# Patient Record
Sex: Male | Born: 1945 | Race: White | Hispanic: No | Marital: Married | Smoking: Former smoker
Health system: Southern US, Community
[De-identification: ages and names within clinical notes are randomized; demographics above are authoritative.]

## PROBLEM LIST (undated history)

## (undated) DIAGNOSIS — J449 Chronic obstructive pulmonary disease, unspecified: Secondary | ICD-10-CM

## (undated) DIAGNOSIS — Z992 Dependence on renal dialysis: Secondary | ICD-10-CM

## (undated) DIAGNOSIS — I639 Cerebral infarction, unspecified: Secondary | ICD-10-CM

## (undated) DIAGNOSIS — E785 Hyperlipidemia, unspecified: Secondary | ICD-10-CM

## (undated) DIAGNOSIS — E119 Type 2 diabetes mellitus without complications: Secondary | ICD-10-CM

## (undated) DIAGNOSIS — K219 Gastro-esophageal reflux disease without esophagitis: Secondary | ICD-10-CM

## (undated) DIAGNOSIS — I2510 Atherosclerotic heart disease of native coronary artery without angina pectoris: Secondary | ICD-10-CM

## (undated) DIAGNOSIS — H547 Unspecified visual loss: Secondary | ICD-10-CM

## (undated) DIAGNOSIS — E1139 Type 2 diabetes mellitus with other diabetic ophthalmic complication: Secondary | ICD-10-CM

## (undated) DIAGNOSIS — J45909 Unspecified asthma, uncomplicated: Secondary | ICD-10-CM

## (undated) DIAGNOSIS — N186 End stage renal disease: Secondary | ICD-10-CM

## (undated) DIAGNOSIS — J189 Pneumonia, unspecified organism: Secondary | ICD-10-CM

## (undated) DIAGNOSIS — J479 Bronchiectasis, uncomplicated: Secondary | ICD-10-CM

## (undated) DIAGNOSIS — I517 Cardiomegaly: Secondary | ICD-10-CM

## (undated) DIAGNOSIS — I509 Heart failure, unspecified: Secondary | ICD-10-CM

## (undated) DIAGNOSIS — Z9981 Dependence on supplemental oxygen: Secondary | ICD-10-CM

## (undated) DIAGNOSIS — I252 Old myocardial infarction: Secondary | ICD-10-CM

## (undated) DIAGNOSIS — I10 Essential (primary) hypertension: Secondary | ICD-10-CM

## (undated) HISTORY — PX: APPENDECTOMY: SHX54

## (undated) HISTORY — PX: CATARACT EXTRACTION W/ INTRAOCULAR LENS IMPLANT: SHX1309

## (undated) SURGERY — Surgical Case
Anesthesia: *Unknown

---

## 1979-01-02 HISTORY — PX: REPAIR ANKLE LIGAMENT: SUR1187

## 1995-01-02 HISTORY — PX: TOTAL HIP ARTHROPLASTY: SHX124

## 2004-01-02 DIAGNOSIS — I252 Old myocardial infarction: Secondary | ICD-10-CM

## 2004-01-02 DIAGNOSIS — I639 Cerebral infarction, unspecified: Secondary | ICD-10-CM

## 2004-01-02 DIAGNOSIS — I2510 Atherosclerotic heart disease of native coronary artery without angina pectoris: Secondary | ICD-10-CM

## 2004-01-02 HISTORY — DX: Old myocardial infarction: I25.2

## 2004-01-02 HISTORY — DX: Cerebral infarction, unspecified: I63.9

## 2004-01-02 HISTORY — DX: Atherosclerotic heart disease of native coronary artery without angina pectoris: I25.10

## 2004-05-10 ENCOUNTER — Ambulatory Visit: Payer: Self-pay | Admitting: Ophthalmology

## 2005-07-05 ENCOUNTER — Ambulatory Visit: Payer: Self-pay | Admitting: Ophthalmology

## 2005-07-11 ENCOUNTER — Ambulatory Visit: Payer: Self-pay | Admitting: Ophthalmology

## 2006-08-29 ENCOUNTER — Inpatient Hospital Stay (HOSPITAL_COMMUNITY): Admission: EM | Admit: 2006-08-29 | Discharge: 2006-09-04 | Payer: Self-pay | Admitting: Internal Medicine

## 2006-08-29 ENCOUNTER — Ambulatory Visit: Payer: Self-pay | Admitting: Cardiovascular Disease

## 2006-08-30 ENCOUNTER — Encounter (INDEPENDENT_AMBULATORY_CARE_PROVIDER_SITE_OTHER): Payer: Self-pay | Admitting: Nephrology

## 2006-08-30 ENCOUNTER — Encounter (INDEPENDENT_AMBULATORY_CARE_PROVIDER_SITE_OTHER): Payer: Self-pay | Admitting: Orthopaedic Surgery

## 2006-08-31 ENCOUNTER — Encounter (INDEPENDENT_AMBULATORY_CARE_PROVIDER_SITE_OTHER): Payer: Self-pay | Admitting: Gastroenterology

## 2006-09-02 HISTORY — PX: AV FISTULA PLACEMENT: SHX1204

## 2006-09-02 HISTORY — PX: INSERTION OF DIALYSIS CATHETER: SHX1324

## 2006-09-03 ENCOUNTER — Ambulatory Visit: Payer: Self-pay | Admitting: Surgery

## 2006-09-09 ENCOUNTER — Ambulatory Visit: Payer: Self-pay | Admitting: Surgery

## 2007-06-16 ENCOUNTER — Ambulatory Visit: Payer: Self-pay | Admitting: Surgery

## 2007-06-18 ENCOUNTER — Ambulatory Visit (HOSPITAL_COMMUNITY): Admission: RE | Admit: 2007-06-18 | Discharge: 2007-06-18 | Payer: Self-pay | Admitting: Surgery

## 2007-06-18 ENCOUNTER — Ambulatory Visit: Payer: Self-pay | Admitting: Surgery

## 2007-07-07 ENCOUNTER — Ambulatory Visit: Payer: Self-pay | Admitting: Surgery

## 2007-08-11 ENCOUNTER — Ambulatory Visit: Payer: Self-pay | Admitting: Surgery

## 2007-11-17 ENCOUNTER — Ambulatory Visit: Payer: Self-pay | Admitting: Surgery

## 2008-02-07 ENCOUNTER — Ambulatory Visit (HOSPITAL_COMMUNITY): Admission: RE | Admit: 2008-02-07 | Discharge: 2008-02-07 | Payer: Self-pay | Admitting: Critical Care Medicine

## 2008-02-07 ENCOUNTER — Other Ambulatory Visit: Payer: Self-pay | Admitting: Emergency Medicine

## 2008-12-31 ENCOUNTER — Emergency Department (HOSPITAL_COMMUNITY): Admission: EM | Admit: 2008-12-31 | Discharge: 2008-12-31 | Payer: Self-pay | Admitting: Emergency Medicine

## 2009-08-18 ENCOUNTER — Inpatient Hospital Stay (HOSPITAL_COMMUNITY): Admission: EM | Admit: 2009-08-18 | Discharge: 2009-08-22 | Payer: Self-pay | Admitting: Emergency Medicine

## 2009-08-19 ENCOUNTER — Encounter: Payer: Self-pay | Admitting: Family Medicine

## 2009-08-20 ENCOUNTER — Encounter (INDEPENDENT_AMBULATORY_CARE_PROVIDER_SITE_OTHER): Payer: Self-pay | Admitting: Internal Medicine

## 2009-08-23 ENCOUNTER — Inpatient Hospital Stay (HOSPITAL_COMMUNITY): Admission: EM | Admit: 2009-08-23 | Discharge: 2009-09-02 | Payer: Self-pay | Admitting: Emergency Medicine

## 2009-10-19 ENCOUNTER — Ambulatory Visit (HOSPITAL_COMMUNITY): Admission: RE | Admit: 2009-10-19 | Discharge: 2009-10-19 | Payer: Self-pay | Admitting: Nephrology

## 2009-11-15 ENCOUNTER — Inpatient Hospital Stay (HOSPITAL_COMMUNITY): Admission: EM | Admit: 2009-11-15 | Discharge: 2009-11-18 | Payer: Self-pay | Admitting: Emergency Medicine

## 2010-03-14 LAB — CBC
HCT: 33.7 % — ABNORMAL LOW (ref 39.0–52.0)
HCT: 34.8 % — ABNORMAL LOW (ref 39.0–52.0)
Hemoglobin: 10.6 g/dL — ABNORMAL LOW (ref 13.0–17.0)
Hemoglobin: 10.8 g/dL — ABNORMAL LOW (ref 13.0–17.0)
Hemoglobin: 10.8 g/dL — ABNORMAL LOW (ref 13.0–17.0)
MCH: 29.6 pg (ref 26.0–34.0)
MCH: 29.9 pg (ref 26.0–34.0)
MCHC: 31.7 g/dL (ref 30.0–36.0)
MCHC: 31.7 g/dL (ref 30.0–36.0)
MCHC: 32 g/dL (ref 30.0–36.0)
MCV: 93.8 fL (ref 78.0–100.0)
MCV: 94.9 fL (ref 78.0–100.0)
Platelets: 293 10*3/uL (ref 150–400)
RBC: 3.53 MIL/uL — ABNORMAL LOW (ref 4.22–5.81)
RBC: 3.61 MIL/uL — ABNORMAL LOW (ref 4.22–5.81)
RBC: 3.71 MIL/uL — ABNORMAL LOW (ref 4.22–5.81)
WBC: 12.4 10*3/uL — ABNORMAL HIGH (ref 4.0–10.5)
WBC: 13.5 10*3/uL — ABNORMAL HIGH (ref 4.0–10.5)

## 2010-03-14 LAB — CULTURE, BLOOD (ROUTINE X 2)

## 2010-03-14 LAB — PROLACTIN: Prolactin: 24.3 ng/mL — ABNORMAL HIGH (ref 2.1–17.1)

## 2010-03-14 LAB — RENAL FUNCTION PANEL
Albumin: 2.9 g/dL — ABNORMAL LOW (ref 3.5–5.2)
Albumin: 3 g/dL — ABNORMAL LOW (ref 3.5–5.2)
BUN: 34 mg/dL — ABNORMAL HIGH (ref 6–23)
CO2: 23 mEq/L (ref 19–32)
CO2: 25 mEq/L (ref 19–32)
Calcium: 8.6 mg/dL (ref 8.4–10.5)
Chloride: 93 mEq/L — ABNORMAL LOW (ref 96–112)
Chloride: 94 mEq/L — ABNORMAL LOW (ref 96–112)
Creatinine, Ser: 10.69 mg/dL — ABNORMAL HIGH (ref 0.4–1.5)
Creatinine, Ser: 7.68 mg/dL — ABNORMAL HIGH (ref 0.4–1.5)
GFR calc Af Amer: 6 mL/min — ABNORMAL LOW (ref >60)
GFR calc Af Amer: 8 mL/min — ABNORMAL LOW (ref >60)
GFR calc non Af Amer: 5 mL/min — ABNORMAL LOW (ref >60)
GFR calc non Af Amer: 6 mL/min — ABNORMAL LOW (ref >60)
Glucose, Bld: 119 mg/dL — ABNORMAL HIGH (ref 70–99)
Glucose, Bld: 142 mg/dL — ABNORMAL HIGH (ref 70–99)
Phosphorus: 6.2 mg/dL — ABNORMAL HIGH (ref 2.3–4.6)
Potassium: 3.9 mEq/L (ref 3.5–5.1)
Potassium: 3.9 mEq/L (ref 3.5–5.1)
Sodium: 131 mEq/L — ABNORMAL LOW (ref 135–145)
Sodium: 132 mEq/L — ABNORMAL LOW (ref 135–145)

## 2010-03-14 LAB — URINALYSIS, ROUTINE W REFLEX MICROSCOPIC
Bilirubin Urine: NEGATIVE
Ketones, ur: NEGATIVE mg/dL
Nitrite: NEGATIVE
Protein, ur: >300 mg/dL — AB
pH: 6 (ref 5.0–8.0)

## 2010-03-14 LAB — GLUCOSE, CAPILLARY
Glucose-Capillary: 131 mg/dL — ABNORMAL HIGH (ref 70–99)
Glucose-Capillary: 147 mg/dL — ABNORMAL HIGH (ref 70–99)
Glucose-Capillary: 149 mg/dL — ABNORMAL HIGH (ref 70–99)
Glucose-Capillary: 157 mg/dL — ABNORMAL HIGH (ref 70–99)
Glucose-Capillary: 216 mg/dL — ABNORMAL HIGH (ref 70–99)
Glucose-Capillary: 218 mg/dL — ABNORMAL HIGH (ref 70–99)
Glucose-Capillary: 250 mg/dL — ABNORMAL HIGH (ref 70–99)

## 2010-03-14 LAB — BLOOD GAS, ARTERIAL
Drawn by: 12971
FIO2: 0.21 %
O2 Saturation: 99.3 %
Patient temperature: 98.6

## 2010-03-14 LAB — COMPREHENSIVE METABOLIC PANEL
ALT: 19 U/L (ref 0–53)
Alkaline Phosphatase: 49 U/L (ref 39–117)
CO2: 24 mEq/L (ref 19–32)
GFR calc non Af Amer: 4 mL/min — ABNORMAL LOW (ref >60)
Glucose, Bld: 224 mg/dL — ABNORMAL HIGH (ref 70–99)
Potassium: 6.1 mEq/L — ABNORMAL HIGH (ref 3.5–5.1)
Sodium: 138 mEq/L (ref 135–145)

## 2010-03-14 LAB — URINE MICROSCOPIC-ADD ON

## 2010-03-14 LAB — URINE CULTURE
Colony Count: 65000
Culture  Setup Time: 201111151022

## 2010-03-14 LAB — LIPASE, BLOOD: Lipase: 22 U/L (ref 11–59)

## 2010-03-14 LAB — DIFFERENTIAL
Basophils Absolute: 0.1 10*3/uL (ref 0.0–0.1)
Basophils Absolute: 0.1 10*3/uL (ref 0.0–0.1)
Basophils Relative: 1 % (ref 0–1)
Basophils Relative: 1 % (ref 0–1)
Eosinophils Absolute: 0.3 10*3/uL (ref 0.0–0.7)
Monocytes Relative: 11 % (ref 3–12)
Neutro Abs: 8.4 10*3/uL — ABNORMAL HIGH (ref 1.7–7.7)
Neutrophils Relative %: 64 % (ref 43–77)
Neutrophils Relative %: 68 % (ref 43–77)

## 2010-03-14 LAB — POCT CARDIAC MARKERS
CKMB, poc: 6 ng/mL (ref 1.0–8.0)
Troponin i, poc: <0.05 ng/mL (ref 0.00–0.09)

## 2010-03-15 LAB — CBC
Hemoglobin: 10.8 g/dL — ABNORMAL LOW (ref 13.0–17.0)
MCH: 30.2 pg (ref 26.0–34.0)
Platelets: 296 10*3/uL (ref 150–400)
RBC: 3.58 MIL/uL — ABNORMAL LOW (ref 4.22–5.81)
WBC: 14.2 10*3/uL — ABNORMAL HIGH (ref 4.0–10.5)

## 2010-03-15 LAB — PROTIME-INR
INR: 0.98 (ref 0.00–1.49)
Prothrombin Time: 13.2 seconds (ref 11.6–15.2)

## 2010-03-15 LAB — GLUCOSE, CAPILLARY: Glucose-Capillary: 98 mg/dL (ref 70–99)

## 2010-03-16 LAB — BASIC METABOLIC PANEL
BUN: 69 mg/dL — ABNORMAL HIGH (ref 6–23)
CO2: 28 mEq/L (ref 19–32)
Chloride: 94 mEq/L — ABNORMAL LOW (ref 96–112)
Potassium: 5 mEq/L (ref 3.5–5.1)

## 2010-03-16 LAB — GLUCOSE, CAPILLARY
Glucose-Capillary: 117 mg/dL — ABNORMAL HIGH (ref 70–99)
Glucose-Capillary: 140 mg/dL — ABNORMAL HIGH (ref 70–99)
Glucose-Capillary: 172 mg/dL — ABNORMAL HIGH (ref 70–99)
Glucose-Capillary: 214 mg/dL — ABNORMAL HIGH (ref 70–99)

## 2010-03-16 LAB — CBC
HCT: 29.8 % — ABNORMAL LOW (ref 39.0–52.0)
Hemoglobin: 10 g/dL — ABNORMAL LOW (ref 13.0–17.0)
MCH: 30.3 pg (ref 26.0–34.0)
MCHC: 33.6 g/dL (ref 30.0–36.0)
MCV: 90.3 fL (ref 78.0–100.0)
Platelets: 222 10*3/uL (ref 150–400)
RBC: 3.3 MIL/uL — ABNORMAL LOW (ref 4.22–5.81)
RDW: 12.3 % (ref 11.5–15.5)
WBC: 8.1 10*3/uL (ref 4.0–10.5)

## 2010-03-16 LAB — APTT: aPTT: 54 seconds — ABNORMAL HIGH (ref 24–37)

## 2010-03-16 LAB — PROTHROMBIN GENE MUTATION

## 2010-03-17 LAB — COMPREHENSIVE METABOLIC PANEL
ALT: 19 U/L (ref 0–53)
AST: 21 U/L (ref 0–37)
Albumin: 2.9 g/dL — ABNORMAL LOW (ref 3.5–5.2)
Alkaline Phosphatase: 51 U/L (ref 39–117)
BUN: 28 mg/dL — ABNORMAL HIGH (ref 6–23)
CO2: 29 mEq/L (ref 19–32)
CO2: 31 mEq/L (ref 19–32)
Calcium: 9 mg/dL (ref 8.4–10.5)
Calcium: 9.4 mg/dL (ref 8.4–10.5)
Chloride: 95 mEq/L — ABNORMAL LOW (ref 96–112)
Creatinine, Ser: 5.06 mg/dL — ABNORMAL HIGH (ref 0.4–1.5)
Creatinine, Ser: 6.49 mg/dL — ABNORMAL HIGH (ref 0.4–1.5)
GFR calc Af Amer: 11 mL/min — ABNORMAL LOW (ref >60)
GFR calc Af Amer: 14 mL/min — ABNORMAL LOW (ref >60)
GFR calc non Af Amer: 12 mL/min — ABNORMAL LOW (ref >60)
GFR calc non Af Amer: 9 mL/min — ABNORMAL LOW (ref >60)
Glucose, Bld: 342 mg/dL — ABNORMAL HIGH (ref 70–99)
Glucose, Bld: 75 mg/dL (ref 70–99)
Potassium: 4.1 mEq/L (ref 3.5–5.1)
Sodium: 134 mEq/L — ABNORMAL LOW (ref 135–145)
Sodium: 134 mEq/L — ABNORMAL LOW (ref 135–145)
Total Bilirubin: 0.4 mg/dL (ref 0.3–1.2)
Total Protein: 5.7 g/dL — ABNORMAL LOW (ref 6.0–8.3)
Total Protein: 6.2 g/dL (ref 6.0–8.3)

## 2010-03-17 LAB — URINE MICROSCOPIC-ADD ON

## 2010-03-17 LAB — DIFFERENTIAL
Basophils Absolute: 0 10*3/uL (ref 0.0–0.1)
Basophils Absolute: 0.1 10*3/uL (ref 0.0–0.1)
Basophils Absolute: 0.1 10*3/uL (ref 0.0–0.1)
Basophils Absolute: 0.1 10*3/uL (ref 0.0–0.1)
Basophils Relative: 0 % (ref 0–1)
Basophils Relative: 1 % (ref 0–1)
Basophils Relative: 1 % (ref 0–1)
Basophils Relative: 1 % (ref 0–1)
Eosinophils Absolute: 0.1 10*3/uL (ref 0.0–0.7)
Eosinophils Absolute: 0.2 10*3/uL (ref 0.0–0.7)
Eosinophils Absolute: 0.4 10*3/uL (ref 0.0–0.7)
Eosinophils Absolute: 0.5 10*3/uL (ref 0.0–0.7)
Eosinophils Relative: 1 % (ref 0–5)
Eosinophils Relative: 2 % (ref 0–5)
Eosinophils Relative: 4 % (ref 0–5)
Eosinophils Relative: 6 % — ABNORMAL HIGH (ref 0–5)
Lymphocytes Relative: 13 % (ref 12–46)
Lymphocytes Relative: 27 % (ref 12–46)
Lymphs Abs: 1.3 10*3/uL (ref 0.7–4.0)
Lymphs Abs: 2.3 10*3/uL (ref 0.7–4.0)
Lymphs Abs: 3 10*3/uL (ref 0.7–4.0)
Monocytes Absolute: 0.8 10*3/uL (ref 0.1–1.0)
Monocytes Absolute: 0.8 10*3/uL (ref 0.1–1.0)
Monocytes Absolute: 0.9 10*3/uL (ref 0.1–1.0)
Monocytes Absolute: 1.1 10*3/uL — ABNORMAL HIGH (ref 0.1–1.0)
Monocytes Relative: 10 % (ref 3–12)
Monocytes Relative: 8 % (ref 3–12)
Monocytes Relative: 8 % (ref 3–12)
Monocytes Relative: 8 % (ref 3–12)
Monocytes Relative: 9 % (ref 3–12)
Neutro Abs: 4.7 10*3/uL (ref 1.7–7.7)
Neutro Abs: 6.3 10*3/uL (ref 1.7–7.7)
Neutro Abs: 7.7 10*3/uL (ref 1.7–7.7)
Neutro Abs: 7.7 10*3/uL (ref 1.7–7.7)
Neutrophils Relative %: 56 % (ref 43–77)
Neutrophils Relative %: 58 % (ref 43–77)
Neutrophils Relative %: 77 % (ref 43–77)

## 2010-03-17 LAB — CULTURE, BLOOD (ROUTINE X 2): Culture: NO GROWTH

## 2010-03-17 LAB — GLUCOSE, CAPILLARY
Glucose-Capillary: 100 mg/dL — ABNORMAL HIGH (ref 70–99)
Glucose-Capillary: 114 mg/dL — ABNORMAL HIGH (ref 70–99)
Glucose-Capillary: 114 mg/dL — ABNORMAL HIGH (ref 70–99)
Glucose-Capillary: 118 mg/dL — ABNORMAL HIGH (ref 70–99)
Glucose-Capillary: 122 mg/dL — ABNORMAL HIGH (ref 70–99)
Glucose-Capillary: 123 mg/dL — ABNORMAL HIGH (ref 70–99)
Glucose-Capillary: 130 mg/dL — ABNORMAL HIGH (ref 70–99)
Glucose-Capillary: 135 mg/dL — ABNORMAL HIGH (ref 70–99)
Glucose-Capillary: 139 mg/dL — ABNORMAL HIGH (ref 70–99)
Glucose-Capillary: 141 mg/dL — ABNORMAL HIGH (ref 70–99)
Glucose-Capillary: 157 mg/dL — ABNORMAL HIGH (ref 70–99)
Glucose-Capillary: 173 mg/dL — ABNORMAL HIGH (ref 70–99)
Glucose-Capillary: 177 mg/dL — ABNORMAL HIGH (ref 70–99)
Glucose-Capillary: 183 mg/dL — ABNORMAL HIGH (ref 70–99)
Glucose-Capillary: 184 mg/dL — ABNORMAL HIGH (ref 70–99)
Glucose-Capillary: 185 mg/dL — ABNORMAL HIGH (ref 70–99)
Glucose-Capillary: 201 mg/dL — ABNORMAL HIGH (ref 70–99)
Glucose-Capillary: 206 mg/dL — ABNORMAL HIGH (ref 70–99)
Glucose-Capillary: 212 mg/dL — ABNORMAL HIGH (ref 70–99)
Glucose-Capillary: 221 mg/dL — ABNORMAL HIGH (ref 70–99)
Glucose-Capillary: 222 mg/dL — ABNORMAL HIGH (ref 70–99)
Glucose-Capillary: 227 mg/dL — ABNORMAL HIGH (ref 70–99)
Glucose-Capillary: 228 mg/dL — ABNORMAL HIGH (ref 70–99)
Glucose-Capillary: 229 mg/dL — ABNORMAL HIGH (ref 70–99)
Glucose-Capillary: 233 mg/dL — ABNORMAL HIGH (ref 70–99)
Glucose-Capillary: 245 mg/dL — ABNORMAL HIGH (ref 70–99)
Glucose-Capillary: 245 mg/dL — ABNORMAL HIGH (ref 70–99)
Glucose-Capillary: 261 mg/dL — ABNORMAL HIGH (ref 70–99)
Glucose-Capillary: 374 mg/dL — ABNORMAL HIGH (ref 70–99)
Glucose-Capillary: 51 mg/dL — ABNORMAL LOW (ref 70–99)
Glucose-Capillary: 54 mg/dL — ABNORMAL LOW (ref 70–99)
Glucose-Capillary: 56 mg/dL — ABNORMAL LOW (ref 70–99)
Glucose-Capillary: 56 mg/dL — ABNORMAL LOW (ref 70–99)
Glucose-Capillary: 61 mg/dL — ABNORMAL LOW (ref 70–99)
Glucose-Capillary: 74 mg/dL (ref 70–99)
Glucose-Capillary: 76 mg/dL (ref 70–99)
Glucose-Capillary: 76 mg/dL (ref 70–99)
Glucose-Capillary: 77 mg/dL (ref 70–99)
Glucose-Capillary: 80 mg/dL (ref 70–99)

## 2010-03-17 LAB — BASIC METABOLIC PANEL
BUN: 19 mg/dL (ref 6–23)
BUN: 40 mg/dL — ABNORMAL HIGH (ref 6–23)
BUN: 41 mg/dL — ABNORMAL HIGH (ref 6–23)
CO2: 27 mEq/L (ref 19–32)
CO2: 28 mEq/L (ref 19–32)
CO2: 29 mEq/L (ref 19–32)
CO2: 29 mEq/L (ref 19–32)
Calcium: 10.3 mg/dL (ref 8.4–10.5)
Calcium: 9.4 mg/dL (ref 8.4–10.5)
Calcium: 9.6 mg/dL (ref 8.4–10.5)
Chloride: 101 mEq/L (ref 96–112)
Chloride: 91 mEq/L — ABNORMAL LOW (ref 96–112)
Chloride: 95 mEq/L — ABNORMAL LOW (ref 96–112)
Chloride: 99 mEq/L (ref 96–112)
Creatinine, Ser: 5.18 mg/dL — ABNORMAL HIGH (ref 0.4–1.5)
Creatinine, Ser: 5.25 mg/dL — ABNORMAL HIGH (ref 0.4–1.5)
Creatinine, Ser: 7.33 mg/dL — ABNORMAL HIGH (ref 0.4–1.5)
Creatinine, Ser: 8.74 mg/dL — ABNORMAL HIGH (ref 0.4–1.5)
GFR calc Af Amer: 13 mL/min — ABNORMAL LOW (ref >60)
GFR calc Af Amer: 14 mL/min — ABNORMAL LOW (ref >60)
GFR calc Af Amer: 7 mL/min — ABNORMAL LOW (ref >60)
GFR calc Af Amer: 9 mL/min — ABNORMAL LOW (ref >60)
GFR calc non Af Amer: 10 mL/min — ABNORMAL LOW (ref >60)
GFR calc non Af Amer: 11 mL/min — ABNORMAL LOW (ref >60)
GFR calc non Af Amer: 6 mL/min — ABNORMAL LOW (ref >60)
GFR calc non Af Amer: 8 mL/min — ABNORMAL LOW (ref >60)
Glucose, Bld: 166 mg/dL — ABNORMAL HIGH (ref 70–99)
Glucose, Bld: 211 mg/dL — ABNORMAL HIGH (ref 70–99)
Glucose, Bld: 253 mg/dL — ABNORMAL HIGH (ref 70–99)
Potassium: 3.5 mEq/L (ref 3.5–5.1)
Potassium: 3.7 mEq/L (ref 3.5–5.1)
Potassium: 4.2 mEq/L (ref 3.5–5.1)
Potassium: 4.6 mEq/L (ref 3.5–5.1)
Potassium: 5 mEq/L (ref 3.5–5.1)
Sodium: 129 mEq/L — ABNORMAL LOW (ref 135–145)
Sodium: 135 mEq/L (ref 135–145)
Sodium: 135 mEq/L (ref 135–145)
Sodium: 137 mEq/L (ref 135–145)
Sodium: 142 mEq/L (ref 135–145)

## 2010-03-17 LAB — TROPONIN I
Troponin I: 0.05 ng/mL (ref 0.00–0.06)
Troponin I: 0.06 ng/mL (ref 0.00–0.06)
Troponin I: 0.08 ng/mL — ABNORMAL HIGH (ref 0.00–0.06)

## 2010-03-17 LAB — POCT CARDIAC MARKERS
CKMB, poc: 2.1 ng/mL (ref 1.0–8.0)
CKMB, poc: 3.5 ng/mL (ref 1.0–8.0)
Myoglobin, poc: >500 ng/mL (ref 12–200)
Troponin i, poc: <0.05 ng/mL (ref 0.00–0.09)
Troponin i, poc: <0.05 ng/mL (ref 0.00–0.09)

## 2010-03-17 LAB — RENAL FUNCTION PANEL
Albumin: 2.9 g/dL — ABNORMAL LOW (ref 3.5–5.2)
Albumin: 3 g/dL — ABNORMAL LOW (ref 3.5–5.2)
Albumin: 3 g/dL — ABNORMAL LOW (ref 3.5–5.2)
Albumin: 3.1 g/dL — ABNORMAL LOW (ref 3.5–5.2)
BUN: 19 mg/dL (ref 6–23)
BUN: 22 mg/dL (ref 6–23)
BUN: 45 mg/dL — ABNORMAL HIGH (ref 6–23)
CO2: 27 mEq/L (ref 19–32)
CO2: 30 mEq/L (ref 19–32)
CO2: 31 mEq/L (ref 19–32)
CO2: 31 mEq/L (ref 19–32)
CO2: 35 mEq/L — ABNORMAL HIGH (ref 19–32)
Calcium: 10.3 mg/dL (ref 8.4–10.5)
Calcium: 10.6 mg/dL — ABNORMAL HIGH (ref 8.4–10.5)
Calcium: 9.9 mg/dL (ref 8.4–10.5)
Chloride: 100 mEq/L (ref 96–112)
Chloride: 89 mEq/L — ABNORMAL LOW (ref 96–112)
Chloride: 91 mEq/L — ABNORMAL LOW (ref 96–112)
Chloride: 98 mEq/L (ref 96–112)
Creatinine, Ser: 10.37 mg/dL — ABNORMAL HIGH (ref 0.4–1.5)
Creatinine, Ser: 6.07 mg/dL — ABNORMAL HIGH (ref 0.4–1.5)
Creatinine, Ser: 6.96 mg/dL — ABNORMAL HIGH (ref 0.4–1.5)
Creatinine, Ser: 8.35 mg/dL — ABNORMAL HIGH (ref 0.4–1.5)
GFR calc Af Amer: 11 mL/min — ABNORMAL LOW (ref >60)
GFR calc Af Amer: 6 mL/min — ABNORMAL LOW (ref >60)
GFR calc Af Amer: 7 mL/min — ABNORMAL LOW (ref >60)
GFR calc Af Amer: 8 mL/min — ABNORMAL LOW (ref >60)
GFR calc non Af Amer: 5 mL/min — ABNORMAL LOW (ref >60)
GFR calc non Af Amer: 6 mL/min — ABNORMAL LOW (ref >60)
GFR calc non Af Amer: 7 mL/min — ABNORMAL LOW (ref >60)
GFR calc non Af Amer: 9 mL/min — ABNORMAL LOW (ref >60)
Glucose, Bld: 108 mg/dL — ABNORMAL HIGH (ref 70–99)
Glucose, Bld: 149 mg/dL — ABNORMAL HIGH (ref 70–99)
Glucose, Bld: 235 mg/dL — ABNORMAL HIGH (ref 70–99)
Glucose, Bld: 79 mg/dL (ref 70–99)
Phosphorus: 4.1 mg/dL (ref 2.3–4.6)
Phosphorus: 5.2 mg/dL — ABNORMAL HIGH (ref 2.3–4.6)
Phosphorus: 5.8 mg/dL — ABNORMAL HIGH (ref 2.3–4.6)
Potassium: 3.4 mEq/L — ABNORMAL LOW (ref 3.5–5.1)
Potassium: 3.9 mEq/L (ref 3.5–5.1)
Sodium: 128 mEq/L — ABNORMAL LOW (ref 135–145)
Sodium: 130 mEq/L — ABNORMAL LOW (ref 135–145)
Sodium: 135 mEq/L (ref 135–145)
Sodium: 136 mEq/L (ref 135–145)
Sodium: 137 mEq/L (ref 135–145)
Sodium: 141 mEq/L (ref 135–145)

## 2010-03-17 LAB — LIPID PANEL
Cholesterol: 80 mg/dL (ref 0–200)
LDL Cholesterol: 23 mg/dL (ref 0–99)
LDL Cholesterol: 37 mg/dL (ref 0–99)
Triglycerides: 152 mg/dL — ABNORMAL HIGH (ref <150)
VLDL: 27 mg/dL (ref 0–40)

## 2010-03-17 LAB — CBC
HCT: 31.6 % — ABNORMAL LOW (ref 39.0–52.0)
HCT: 32.3 % — ABNORMAL LOW (ref 39.0–52.0)
HCT: 32.6 % — ABNORMAL LOW (ref 39.0–52.0)
HCT: 33.4 % — ABNORMAL LOW (ref 39.0–52.0)
HCT: 33.7 % — ABNORMAL LOW (ref 39.0–52.0)
HCT: 36.1 % — ABNORMAL LOW (ref 39.0–52.0)
Hemoglobin: 10.2 g/dL — ABNORMAL LOW (ref 13.0–17.0)
Hemoglobin: 10.3 g/dL — ABNORMAL LOW (ref 13.0–17.0)
Hemoglobin: 10.3 g/dL — ABNORMAL LOW (ref 13.0–17.0)
Hemoglobin: 10.4 g/dL — ABNORMAL LOW (ref 13.0–17.0)
Hemoglobin: 10.8 g/dL — ABNORMAL LOW (ref 13.0–17.0)
Hemoglobin: 11.1 g/dL — ABNORMAL LOW (ref 13.0–17.0)
Hemoglobin: 11.1 g/dL — ABNORMAL LOW (ref 13.0–17.0)
Hemoglobin: 11.6 g/dL — ABNORMAL LOW (ref 13.0–17.0)
Hemoglobin: 9.8 g/dL — ABNORMAL LOW (ref 13.0–17.0)
MCH: 29.8 pg (ref 26.0–34.0)
MCH: 29.8 pg (ref 26.0–34.0)
MCH: 29.9 pg (ref 26.0–34.0)
MCH: 29.9 pg (ref 26.0–34.0)
MCH: 30.2 pg (ref 26.0–34.0)
MCH: 30.4 pg (ref 26.0–34.0)
MCH: 30.5 pg (ref 26.0–34.0)
MCHC: 32.1 g/dL (ref 30.0–36.0)
MCHC: 32.3 g/dL (ref 30.0–36.0)
MCHC: 32.4 g/dL (ref 30.0–36.0)
MCHC: 32.9 g/dL (ref 30.0–36.0)
MCHC: 33 g/dL (ref 30.0–36.0)
MCV: 89.3 fL (ref 78.0–100.0)
MCV: 90.3 fL (ref 78.0–100.0)
MCV: 92 fL (ref 78.0–100.0)
MCV: 93.9 fL (ref 78.0–100.0)
MCV: 95 fL (ref 78.0–100.0)
Platelets: 198 10*3/uL (ref 150–400)
Platelets: 202 10*3/uL (ref 150–400)
Platelets: 214 10*3/uL (ref 150–400)
Platelets: 217 10*3/uL (ref 150–400)
Platelets: 242 10*3/uL (ref 150–400)
Platelets: 247 10*3/uL (ref 150–400)
Platelets: 274 10*3/uL (ref 150–400)
RBC: 3.22 MIL/uL — ABNORMAL LOW (ref 4.22–5.81)
RBC: 3.44 MIL/uL — ABNORMAL LOW (ref 4.22–5.81)
RBC: 3.44 MIL/uL — ABNORMAL LOW (ref 4.22–5.81)
RBC: 3.44 MIL/uL — ABNORMAL LOW (ref 4.22–5.81)
RBC: 3.61 MIL/uL — ABNORMAL LOW (ref 4.22–5.81)
RBC: 3.65 MIL/uL — ABNORMAL LOW (ref 4.22–5.81)
RBC: 3.8 MIL/uL — ABNORMAL LOW (ref 4.22–5.81)
RDW: 12.6 % (ref 11.5–15.5)
RDW: 12.7 % (ref 11.5–15.5)
RDW: 12.8 % (ref 11.5–15.5)
RDW: 12.8 % (ref 11.5–15.5)
RDW: 12.9 % (ref 11.5–15.5)
RDW: 13.2 % (ref 11.5–15.5)
WBC: 10 10*3/uL (ref 4.0–10.5)
WBC: 10.5 10*3/uL (ref 4.0–10.5)
WBC: 11 10*3/uL — ABNORMAL HIGH (ref 4.0–10.5)
WBC: 8.8 10*3/uL (ref 4.0–10.5)
WBC: 9.5 10*3/uL (ref 4.0–10.5)

## 2010-03-17 LAB — POCT I-STAT, CHEM 8
BUN: 24 mg/dL — ABNORMAL HIGH (ref 6–23)
Calcium, Ion: 1.11 mmol/L — ABNORMAL LOW (ref 1.12–1.32)
Calcium, Ion: 1.27 mmol/L (ref 1.12–1.32)
Chloride: 100 mEq/L (ref 96–112)
Chloride: 98 mEq/L (ref 96–112)
Creatinine, Ser: 5 mg/dL — ABNORMAL HIGH (ref 0.4–1.5)
Creatinine, Ser: 7.3 mg/dL — ABNORMAL HIGH (ref 0.4–1.5)
Glucose, Bld: 266 mg/dL — ABNORMAL HIGH (ref 70–99)
Glucose, Bld: 344 mg/dL — ABNORMAL HIGH (ref 70–99)
HCT: 36 % — ABNORMAL LOW (ref 39.0–52.0)
HCT: 37 % — ABNORMAL LOW (ref 39.0–52.0)
Hemoglobin: 10.9 g/dL — ABNORMAL LOW (ref 13.0–17.0)
Hemoglobin: 12.2 g/dL — ABNORMAL LOW (ref 13.0–17.0)
Hemoglobin: 12.6 g/dL — ABNORMAL LOW (ref 13.0–17.0)
Potassium: 3.5 mEq/L (ref 3.5–5.1)
Potassium: 4.1 mEq/L (ref 3.5–5.1)
Sodium: 132 mEq/L — ABNORMAL LOW (ref 135–145)
Sodium: 134 mEq/L — ABNORMAL LOW (ref 135–145)
Sodium: 139 mEq/L (ref 135–145)
TCO2: 28 mmol/L (ref 0–100)
TCO2: 28 mmol/L (ref 0–100)

## 2010-03-17 LAB — HEMOGLOBIN A1C
Hgb A1c MFr Bld: 7.3 % — ABNORMAL HIGH (ref <5.7)
Mean Plasma Glucose: 163 mg/dL — ABNORMAL HIGH (ref <117)

## 2010-03-17 LAB — CK TOTAL AND CKMB (NOT AT ARMC)
CK, MB: 6.2 ng/mL (ref 0.3–4.0)
Relative Index: 3 — ABNORMAL HIGH (ref 0.0–2.5)
Relative Index: 5 — ABNORMAL HIGH (ref 0.0–2.5)
Total CK: 112 U/L (ref 7–232)
Total CK: 121 U/L (ref 7–232)
Total CK: 124 U/L (ref 7–232)

## 2010-03-17 LAB — CARDIAC PANEL(CRET KIN+CKTOT+MB+TROPI)
CK, MB: 3.1 ng/mL (ref 0.3–4.0)
CK, MB: 3.1 ng/mL (ref 0.3–4.0)
Relative Index: 2.8 — ABNORMAL HIGH (ref 0.0–2.5)
Relative Index: 2.9 — ABNORMAL HIGH (ref 0.0–2.5)
Total CK: 111 U/L (ref 7–232)
Troponin I: 0.09 ng/mL — ABNORMAL HIGH (ref 0.00–0.06)

## 2010-03-17 LAB — HEPATIC FUNCTION PANEL
AST: 29 U/L (ref 0–37)
Bilirubin, Direct: 0.2 mg/dL (ref 0.0–0.3)
Indirect Bilirubin: 0.3 mg/dL (ref 0.3–0.9)
Total Bilirubin: 0.5 mg/dL (ref 0.3–1.2)

## 2010-03-17 LAB — URINALYSIS, ROUTINE W REFLEX MICROSCOPIC
Bilirubin Urine: NEGATIVE
Bilirubin Urine: NEGATIVE
Ketones, ur: NEGATIVE mg/dL
Ketones, ur: NEGATIVE mg/dL
Leukocytes, UA: NEGATIVE
Nitrite: NEGATIVE
Nitrite: NEGATIVE
Specific Gravity, Urine: 1.025 (ref 1.005–1.030)
Urobilinogen, UA: 0.2 mg/dL (ref 0.0–1.0)
Urobilinogen, UA: 0.2 mg/dL (ref 0.0–1.0)
pH: 6.5 (ref 5.0–8.0)

## 2010-03-17 LAB — PHOSPHORUS: Phosphorus: 4.2 mg/dL (ref 2.3–4.6)

## 2010-03-17 LAB — PROTIME-INR
INR: 0.99 (ref 0.00–1.49)
Prothrombin Time: 13.3 seconds (ref 11.6–15.2)

## 2010-03-17 LAB — TSH
TSH: 0.873 u[IU]/mL (ref 0.350–4.500)
TSH: 1.239 u[IU]/mL (ref 0.350–4.500)

## 2010-03-17 LAB — APTT: aPTT: 31 seconds (ref 24–37)

## 2010-03-17 LAB — MRSA PCR SCREENING: MRSA by PCR: NEGATIVE

## 2010-03-17 LAB — CORTISOL: Cortisol, Plasma: 9.9 ug/dL

## 2010-03-17 LAB — LIPASE, BLOOD: Lipase: 24 U/L (ref 11–59)

## 2010-03-17 LAB — BRAIN NATRIURETIC PEPTIDE: Pro B Natriuretic peptide (BNP): 867 pg/mL — ABNORMAL HIGH (ref 0.0–100.0)

## 2010-03-17 LAB — HEMOCCULT GUIAC POC 1CARD (OFFICE): Fecal Occult Bld: NEGATIVE

## 2010-03-17 LAB — TESTOSTERONE: Testosterone: 135.28 ng/dL — ABNORMAL LOW (ref 350–890)

## 2010-03-17 LAB — LACTIC ACID, PLASMA: Lactic Acid, Venous: 0.9 mmol/L (ref 0.5–2.2)

## 2010-04-03 LAB — COMPREHENSIVE METABOLIC PANEL
AST: 20 U/L (ref 0–37)
Albumin: 3.1 g/dL — ABNORMAL LOW (ref 3.5–5.2)
Alkaline Phosphatase: 44 U/L (ref 39–117)
BUN: 43 mg/dL — ABNORMAL HIGH (ref 6–23)
Chloride: 99 mEq/L (ref 96–112)
GFR calc Af Amer: 10 mL/min — ABNORMAL LOW (ref >60)
Potassium: 4 mEq/L (ref 3.5–5.1)
Total Bilirubin: 0.3 mg/dL (ref 0.3–1.2)

## 2010-04-03 LAB — BRAIN NATRIURETIC PEPTIDE: Pro B Natriuretic peptide (BNP): 264 pg/mL — ABNORMAL HIGH (ref 0.0–100.0)

## 2010-04-03 LAB — CBC
HCT: 35 % — ABNORMAL LOW (ref 39.0–52.0)
Platelets: 206 10*3/uL (ref 150–400)
WBC: 7.3 10*3/uL (ref 4.0–10.5)

## 2010-04-03 LAB — DIFFERENTIAL
Basophils Absolute: 0 10*3/uL (ref 0.0–0.1)
Basophils Relative: 1 % (ref 0–1)
Eosinophils Absolute: 0.4 10*3/uL (ref 0.0–0.7)
Eosinophils Relative: 5 % (ref 0–5)
Monocytes Absolute: 0.7 10*3/uL (ref 0.1–1.0)

## 2010-05-16 NOTE — Assessment & Plan Note (Signed)
OFFICE VISIT   THEODORE, VIRGIN  DOB:  1945/09/12                                       06/16/2007  ZOXWR#:[id number]   REASON FOR VISIT:  Evaluate for steal.   HISTORY:  This is a 65 year old gentleman who had a right forearm graft  placed on 09/03/2006, following that he had mild complaints of steal.  Ultrasound was checked at that time that revealed a left arm pressure of  90 and the right arm pressure without compression of 80 mmHg and with  compression was 94.  I thought this was a mild steal syndrome at that  time and prescribed him exercise.  He was supposed to get in touch me if  it had gotten worse.  He comes back in today because he is having  weakness in his right arm as well as pain and a feeling of coldness.  His graft was used once, and he had significant pain from that.  This  sounds like maybe an extravasation.  He is adamantly refusing using the  graft again.  He states that he will never have another graft and that  this one will never be used again.   EXAM:  His blood pressure is 181/94, pulse 97, respirations 18.  He is  in no acute distress.  There is no evidence of ischemia or necrosis in  the right hand.  With graft compression, the radial artery has a  palpable pulse.  Without compression it is not palpable.   Steal syndrome, right arm.   PLAN:  I had a long conversation with the patient today regarding our  next step.  He is adamant that this graft will never be used again and  that he will only receive dialysis through catheters.  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  At the very least, it  should help with the temperature discrepancy.  I am scheduling him to  get his graft ligated Wednesday June 17th.   Jorge Ny, MD  Electronically Signed   VWB/MEDQ   D:  06/16/2007  T:  06/17/2007  Job:  [id number]   cc:   Garnetta Buddy, M.D.

## 2010-05-16 NOTE — Assessment & Plan Note (Signed)
OFFICE VISIT   OLIVER, HEITZENRATER  DOB:  05-Sep-1945                                       07/07/2007  ZOXWR#:[id number]   REASON FOR VISIT:  Followup.   HISTORY:  This is a 65 year old gentleman who had a forearm graft placed  on 09/03/2006.  At that time he had mild complaints of steal.  He did  not have significant brachial pressure discrepancy.  He is supposed to  get in touch me if they got worse.  However, I did not see him back  until June 15th.  At that time, he had poor motor function in his right  hand.  The right hand was also colder.  He refuses to have dialysis in  that graft and wants it ligated.  On June 27th, he underwent graft  ligation without complications.  He comes back in today for followup.   EXAM:  Incision is well-healed.  He has a palpable radial pulse.  There  is no flow within the graft.   ASSESSMENT/PLAN:  Status post graft ligation for steal.   PLAN:  The patient still has functional deficits with his hand.  He is  still unable to hold a glass and has poor fine motor skills.  I am  recommending that he be seen by a physical therapist to work on  retraining his hand.  He understands that the function may not come  back.  I am going to see him back in a month to see how he is doing.   Jorge Ny, MD  Electronically Signed   VWB/MEDQ  D:  07/07/2007  T:  07/08/2007  Job:  [id number]
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
## 2010-05-16 NOTE — Assessment & Plan Note (Signed)
OFFICE VISIT   JILL, RUPPE  DOB:  12/10/1945                                       08/11/2007  [id number]#:[id number]   REASON FOR VISIT:  Evaluate right arm steal.   HISTORY:  This is a 65 year old gentleman who had a right forearm graft  placed on 09/03/2006.  He had mild complaints of steal at that time, and  it was not until June that I saw him when he had poor motor function and  temperature changes in his right hand.  He had a mild steal by duplex  ultrasound, but I felt that, given his symptoms, we should proceed with  graft ligation, since he was refusing use of the graft.  This graft was  ligated on June 27th, and I saw him back in the office on July 6.  At  that time, he was still having difficulty with his hand, and therefore,  I referred him to physical therapy to help improve his function.  He has  not been very rigorous with his physical therapy.  He is no better today  than he was prior to his graft ligation.   EXAMINATION:  Blood pressure is 132/75, pulse 81.  The right hand is  warmer than it has been in the past.  He is still very weak in his  hands.  He has some thenar atrophy.   PLAN:  We talked of possibly sending for nerve conduction studies.  However, I feel that he needs to do a better job with physical therapy.  I have encouraged him to sort of push the limits of his therapy to try  to improve the function in his hand.  I am going to see him back in 3  months to see how he is doing.   Jorge Ny, MD  Electronically Signed   VWB/MEDQ  D:  08/11/2007  T:  08/12/2007  Job:  [id number]

## 2010-05-16 NOTE — Consult Note (Signed)
NAMEANGEL, WEEDON              ACCOUNT NO.:  [id number]   MEDICAL RECORD NO.:  [id number]          PATIENT TYPE:  INP   LOCATION:  5528                         FACILITY:  [facility]   PHYSICIAN:  Jordan Hawks. Elnoria Howard, MD    DATE OF BIRTH:  Jul 26, 1945   DATE OF CONSULTATION:  08/29/2006  DATE OF DISCHARGE:                                 CONSULTATION   REASON FOR CONSULTATION:  Hematemesis.   REFERRING PHYSICIAN:  Valetta Close, M.D.   HISTORY OF PRESENT ILLNESS:  This is a 66 year old gentleman with a past  medical history of hypertension, CHF, status post MI, chronic renal  insufficiency and a history of CVA and hyperlipidemia and diabetes and  who was admitted to the hospital with general malaise.  The patient was  noted to have a marked increase in his creatinine up to 10.4.  Apparently he had a prior value of 1.7 on February 2006.  The patient is  a very poor historian and is unable to provide any useful information.  Apparently from what was requested, the patient did have an episode of  hematemesis.  It is  uncertain if the patient has any reports of  hematochezia or melena as the patient is legally blind.  Apparently  there is a conflicting history from the family members.  The patient  does have some type of vague abdominal pain but he is unable to provide  any further description in regard to the length of time, the quality,  location, et cetera.  His hemoglobin is noted to be stable at this time.   PAST MEDICAL HISTORY AND PAST SURGICAL HISTORY:  As stated above.   FAMILY HISTORY:  Noncontributory.   SOCIAL HISTORY:  The patient quit tobacco 25 years ago, as well as  alcohol.  He is married and has children.   REVIEW OF SYSTEMS:  Unable to obtain.   MEDICATIONS:  Metoprolol, Lipitor, Lasix, Lantus, glipizide, clonidine,  lisinopril, Plavix and protein shake.   PHYSICAL EXAMINATION:  VITAL SIGNS:  Blood pressure is 140/80s.  Pulse  oximetry is 98% on room air.   Temperature is 99.1.  Heart rate is 55.  GENERAL:  The patient is lying supine in the hemodialysis unit.  He is  in no acute distress.  NECK:  Supple, no lymphadenopathy.  LUNGS:  Are clear to auscultation bilaterally.  CARDIOVASCULAR:  Regular rate and rhythm.  ABDOMEN:  Obese, soft.  Mild tenderness.  Unable to adequately localize.  No rebound or rigidity.  Positive bowel sounds.  EXTREMITIES:  No clubbing, cyanosis, or edema.  RECTAL EXAMINATION:  Heme negative.  No palpable masses and brown stool.   LABORATORY VALUES:  White blood cell count is 13.1, hemoglobin 12.6,  platelets 464.  Sodium 142, potassium 4.8, chloride 100, CO2 21, BUN  197, creatinine 10.5, glucose at 237.  AST 10, ALT 18, alk phos was 51.  Total bilirubin is 0.7, albumin 3.1.  PT is 14.5, INR 1.1, PTT is 33.   IMPRESSION:  1. Reports of hematemesis.  2. Renal failure.  3. Diabetes.  4. Congestive heart failure.  Unfortunately, the patient is not able to provide any useful history.  Again, management will be undertaken per the report of hematemesis  provided by the renal physicians.  There is no significant abdominal  tenderness to suggest any acute life-threatening process at this time.   PLAN:  Esophagogastroduodenoscopy and further recommendations pending  the examination.      Jordan Hawks Elnoria Howard, MD  Electronically Signed     PDH/MEDQ  D:  08/30/2006  T:  08/31/2006  Job:  [id number]

## 2010-05-16 NOTE — H&P (Signed)
[name], [name]              ACCOUNT NO.:  [id number]   MEDICAL RECORD NO.:  [id number]          PATIENT TYPE:  INP   LOCATION:  6713                         FACILITY:  [facility]   Taylor:  Aram Beecham B. Eliott Taylor, M.D.DATE OF BIRTH:  December 06, 1945   DATE OF ADMISSION:  08/29/2006  DATE OF DISCHARGE:                              HISTORY & PHYSICAL   CHIEF COMPLAINT:  Transferred from [facility] for acute on  chronic kidney failure, general malaise.   HISTORY OF PRESENT ILLNESS:  [name] is a 65 year old make with  multiple medical issues including hypertension, diabetes complicated  with retinopathy and neuropathy, CHF diagnosed in 2006 along with an MI  and history of a CVA with resulting left hemiparesis and a prolonged  history of nausea, vomiting and abdominal pain for months who started to  feel worse over the last few weeks.  The history is provided by the  patient, [name], [name] and [name].  Unfortunately, they all  seem to tell conflicting stories.  Todd pain is intermittent in nature.  It can be both sharp and cramping.  It is diffuse in Todd abdomen but  seems to be worse in the left upper quadrant.  He has had a lot of  instances of dry heaving, sometimes leading to actual vomiting of both  liquid and solids when he ingests both.  This has been going on for at  least a month with dry heaving going on for longer.  He is unable to  discern the color of Todd vomit when he does have emesis.  However,  recently [name] stated that she noticed some and that it was  bloody.  However, she is not present during this encounter and the other  Taylor members do not recall this happening.  Nothing seems to make Todd  abdominal pain better or worse.  He has not really taken anything  specific for it.  He specifically denies melena and hematochezia but,  again, notes that he is unable to see when he goes to the bathroom and  he has been having some constipation.  He  says that he has been feeling  lousy for quite some time and has been having excessive daytime  somnolence, changing of Todd sleep patterns and somehow he falls asleep  sometimes in the middle of conversations.  He admits to being dragged  here against Todd will by [name] and that he would prefer to just  stay at home and let everything get better on its own but, on more  specific questioning, agrees to undergo full care.   PAST MEDICAL HISTORY:  Includes chronic renal insufficiency with a last  known creatinine of 1.8 in February 2006 with a BUN of 70 in March of  2008.  He acknowledges being told by [name] that he  would need to see a kidney doctor two years ago which he declined.  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  He has  had multiple laser procedures on Todd  eyes in the past.  Also he has fairly significant neuropathy.  He has  hypertension, congestive heart failure diagnosed in 2006 with an unknown  ejection fraction at this time.  He has a history of a CVA in 2006  affecting Todd left side with hemiparesis and he has had multiple falls  and has had a fracture of Todd left hip and is status post replacement  and has had ligament damage of Todd left ankle status post surgery which  he is unaware of the type.  He also states he has had multiple bouts of  pneumonia, more than he can count, but never requiring hospitalization.  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   SOCIAL HISTORY:  He lives with [name] in [place], married with  Taylor.  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  He quit tobacco 25 years ago along with quitting alcohol 25  years ago.  He has a very Taylor Taylor.  [name] and he know people   with diabetes mellitus including people who are on dialysis so they have  additional multiple medical problems.  He uses a wheelchair for mobility  and he does have the ability to transfer from a bed into a chair,  standing up and being able to take a few steps.   Taylor HISTORY:  Of note, he had a Taylor with multiple medical  conditions who also refused to go to the hospital and seemed to get by  for some time.   REVIEW OF SYSTEMS:  Positive for constipation, general malaise and  daytime somnolence.  It is negative for chest pain, shortness of breath,  diaphoresis, shaking of Todd hands, changes in Todd mental status as per  him.  He does admit to increasing tingling and numbness in Todd lower  extremities.   VITAL SIGNS:  Temperature 97.6, pulse 108, respiratory rate 16, blood  pressure 102/68, O2 saturation 97% on room air.  GENERAL:  He was lying in bed, in no apparent distress but with very  pale skin.  HEENT:  He has multiple missing teeth but Todd oral mucosa was moist  without visible breaks in the skin.  NECK:  Supple with no JVD and no carotid bruits.  He had no  lymphadenopathy.  CARDIOVASCULAR EXAM:  S1, S2, no murmur.  Regular rate and rhythm.  LUNGS:  Clear to auscultation bilaterally with good air entry.  SKIN:  He had no rash or lesions on Todd skin.  ABDOMEN:  Distended but not rigid.  He had positive bowel sounds.  He  had tenderness in Todd left upper quadrant with minimal voluntary  guarding.  He had no peripheral edema.  No joint tenderness but he did  have some fairly significant deconditioning and had a very hard time  sitting up to turn over.  He did have 5/5 strength in Todd grip.  He did  have some decreased sensation in Todd lower extremities along with cool  skin, 1+ pulses in Todd dorsalis pedis and posterior tibial feet but,  again, very difficult to auscultate.  NEUROLOGICAL EXAM:  He was alert and oriented x3 but did, sometimes,  seem a little slow to  respond.  PSYCH EXAM:  There was inappropriate humor but he did become more  focused on Todd condition when prompted by  [name], especially Todd  Taylor.   LABORATORY DATA:  Sodium 139, potassium 5.6, chloride 102, bicarb 12,  glucose 294, BUN 194 with a creatinine of 10.4, calcium 7.7, anion gap  25.  All labs were checked at [facility].  [facility] blood cell count  11.3 with an ANC of 9.1, hemoglobin 13, hematocrit 38, platelets 390.  UA at 3 positive proteins, negative LE, negative nitrites, few bacteria  and 2+ amorphous urates.  T. Bili 0.2, AST 24, ALT 10, amylase 55,  lipase 147 with a reference range of 23 to 300.   ASSESSMENT/PLAN:  1. Renal failure on a baseline of renal insufficiency.  Unsure of how      acute this is.  Based on Todd labs and with a BUN of 194, I would      assume that this has been a gradual trend over a very long time.  2. Diabetes mellitus and hypertension which are the likely causes of      this condition.  We will recheck Todd electrolytes, check a renal      ultrasound, check an SPEP and UPEP and intact PTH, again looking      for reversible causes and associated conditions with chronic renal      insufficiency.  We will also contact intervention radiology to      place a hemodialysis catheter tomorrow.  He will likely need      dialysis tomorrow and, very likely, long term.  This has been      discussed with the patient and he is agreeable to undergo dialysis      at this time.  He is left-handed so we will save Todd right arm;      prevent needle sticks in Todd right arm because that is where we      plan on placing the future graft or fistula.  Based on that BUN and      Todd anion gap, it is almost absolute that he is uremic.  He does      not seem to have volume overload and Todd potassium is only a little      high so dialysis will be for uremia, primarily.  3. Congestive heart failure, history of.  We will check a 2D      echocardiogram to see  exactly where he stands on Todd congestive      heart failure.  He does not have any edema and Todd lung sounds are      clear so I am not sure how bad Todd congestive heart failure is.  He      does not appear to be any acute volume overload.  We will check an      upright portable chest x-ray just to be on the safe side.  4. Nausea and vomiting.  Positive with hematemesis.  It is likely that      this is secondary to Todd advanced uremia but it is also possible      that he could have some gastrointestinal pathology and we will      check a guaiac and give Protonix.  Recheck a CBC; if witnessed      guaiac positive stool or hematemesis, we will need a      gastrointestinal evaluation, inpatient.  Because of Todd age and Todd      comorbidities, he will likely need an out-patient work up to rule      out malignancy.  5. Hypertension.  Todd blood pressure is currently okay.  Most likely      this could be secondary to Todd prolonged nausea and vomiting and      decreased p.o. intake.  Hold all Todd blood pressure medications for      now except Todd beta blocker because of Todd history of coronary      artery disease but I will change that to 50 b.i.d. instead of 75      once a day.  Keep an eye on Todd blood pressure and heart rate and,      based on where Todd blood pressure goes, we can restart Todd blood      pressure medications as indicated.  6. Diabetes mellitus.  He is on Lantus 60 units once a day.  We will      start him on the sliding scale insulin, continue Lantus and see      where Todd sugars are.  I suspect them to be high and then we will      increase Todd Lantus dose up to Todd home dose as needed.  Again, we      are going to keep an eye out for hypoglycemia.  7. Status post myocardial infarction and cerebrovascular accident.      Again, this is history per the patient.  Continue with Plavix for      now.  Keep him on telemetry.  8. Uremia.  As in #1, dialyze him tomorrow.  Likely he  will need      dialysis daily for the next few days.  9. Bronchiectasis.  As per the patient.  Will put him on p.r.n.      albuterol for now.  Follow up Todd chest x-ray.  10.Prophylaxis:  Sequential compression devices for the potential for      gastrointestinal bleed and Protonix for gastrointestinal      prophylaxis.  He will likely go home on discharge.  He remains a      full code.      [name], M.D.  Electronically Signed     ______________________________  [name], M.D.    JC/MEDQ  D:  08/29/2006  T:  08/30/2006  Job:  [id number]

## 2010-05-16 NOTE — Assessment & Plan Note (Signed)
OFFICE VISIT   [name], [name]  DOB:  August 16, 1945                                       09/09/2006  EAVWU#:[id number]   REASON FOR VISIT:  Followup.   HISTORY:  This is a 65 year old gentleman with new-onset end-stage renal  disease who underwent a right forearm loop graft on 09/03/2006.  Following the procedure, he had mild complaints of Steele syndrome which  consisted of numbness in his right  hand.  He was sent home, and he  comes back in today for followup.  In the interim, he has been doing  hand exercises.  He does complain of pain in his forearm, but his hand  has not had any other episodes; and he is doing quite well.  He  underwent duplex evaluation today which reveals a left arm pressure of  90.  A right arm pressure without compression is 80 millimeters of  mercury, and with compression is 94 millimeters of mercury.  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  I discussed extensively with the  patient about what to look out for and who to call if any of these  issues should arise.  Otherwise, he will follow up with me on a p.r.n.  basis.  I gave him a prescription for Percocet.   Jorge Ny, MD  Electronically Signed   VWB/MEDQ  D:  09/09/2006  T:  09/10/2006  Job:  [id number]

## 2010-05-16 NOTE — Procedures (Signed)
VASCULAR LAB EXAM   INDICATION:  Possible steal, right forearm dialysis graft   HISTORY:  Diabetes:  Yes  Cardiac:  No  Hypertension:  Yes   EXAM:  Left arm pressure, 90 mmHg.  Right arm pressure without  compression, 80 mmHg.  Right arm pressure with compression of graft, 94  mmHg.   IMPRESSION:  Mild right arm graft steal.   ___________________________________________  V. Charlena Cross, MD   DP/MEDQ  D:  09/09/2006  T:  09/09/2006  Job:  [id number]

## 2010-05-16 NOTE — Assessment & Plan Note (Signed)
OFFICE VISIT   SOMA, LIZAK  DOB:  1945-01-03                                       11/17/2007  ZOXWR#:[id number]   REASON FOR VISIT:  Follow-up.   HISTORY:  This is a 65 year old gentleman who is status post right  forearm AV Gore-Tex graft placed on September 03, 2006.  He had mild  complaints of steal that time.  However, I did not see him until June,  at which time he had profound motor function weakness as well as  profound temperature changes in his right hand.  He had mild steal by  duplex.  Based on his symptoms, he was taken for graft ligation.  I have  been following him since that time.  He has had poor recovery of his  hand function.  He has tried to work with physical therapy, however has  not made much progress and is now no longer doing any kind of  rehabilitation.  He does still complain of the inability to move his  hand as well as a cool feeling in his hand.   PHYSICAL EXAMINATION:  His blood pressure is 126/69, pulse is 90.  He is  in no distress, in a wheelchair.  He does have a palpable radial pulse.   ASSESSMENT/PLAN:  Status post right forearm graft ligation.   Plan:  I have reiterated to the patient that without proper exercise  that he has very little hope for recovery of his hand function.  He has  been somewhat noncompliant with his physical therapy and is actually no  longer seeing a physical therapist at [facility].  The other  thought I had was that this could potentially be related to nerve injury  from his operation.  I have recommended that he see a neurologist;  however, the patient does not want to do this at this time.  I told him  he could call me if he had any further questions, I would see him on a  p.r.n. basis, and if he wants to see a neurologist I would be happy to  arrange that.   Jorge Ny, MD  Electronically Signed   VWB/MEDQ  D:  11/17/2007  T:  11/18/2007  Job:  [id number]   cc:   Garnetta Buddy, M.D.

## 2010-05-16 NOTE — Op Note (Signed)
[name], [name]              ACCOUNT NO.:  [id number]   MEDICAL RECORD NO.:  [id number]          PATIENT TYPE:  INP   LOCATION:  5528                         FACILITY:  [facility]   PHYSICIAN:  Juleen China IV, MDDATE OF BIRTH:  [birth year]   DATE OF PROCEDURE:  09/03/2006  DATE OF DISCHARGE:                               OPERATIVE REPORT   SURGEON:  1. Charlena Cross, MD.   ASSISTANT:  Nurse.   TYPE OF ANESTHESIA:  MAC.   BLOOD LOSS:  Less than 50 mL.   FINDINGS:  No adequate cephalic or brachial vein for procedure;  therefore, a loop graft was performed from the brachial artery to the  basilic vein.   INDICATIONS FOR PROCEDURE:  [name] is a 65 year old gentleman with  new-onset renal failure, now requiring dialysis.  He is currently  dialyzing through a catheter.  He has undergone preoperative vein  mapping, which found him to have a 2-mm vein in the cephalic vein.  The  risks and benefits of the procedure were discussed with the patient and  informed consent was signed and he was willing to proceed.   PROCEDURE:  The patient was identified in the holding area and taken to  room 15.  He was placed supine on the table.  The right upper arm was  prepped and draped in standard sterile fashion.  Ultrasound was used to  evaluate the cephalic vein.  No dominant cephalic vein was present;  however, the largest vein was approximately 2 mm in caliber.  This was  marked with an ink pen and a transverse incision was made above the  antecubital fossa.  Bovie cautery was used to dissect through the  subcutaneous tissue.  The cephalic vein, which had been identified with  ultrasound, was isolated.  This was a diminutive vein measuring about 1  mm and not of adequate caliber for distally.  An extensive search was  made in this area, looking for an adequate caliber vein and none could  be found.  Next, the brachial artery was exposed.  The pair of brachial  veins along the side  of the brachial artery were also found to be not of  adequate caliber to support a graft.  At this point in time, the  ultrasound was used to evaluate the basilic vein.  The basilic vein  seemed to be the dominant vein in the upper arm.  At this point, the  decision was made to perform a loop graft from the brachial artery to  the basilic vein.  A counterincision was made over the basilic vein;  this was a longitudinal incision.  The basilic vein was then mobilized  and exposed proximally and distally.  The basilic vein measured  approximately 2 to 2.5 mm.  It was of marginal caliber; however, this  was the patient's only option.  Next, a counterincision was made in the  lower forearm.  A curved tunneler was then used to create a tunnel.  The  arterial graft was tunneled towards the ulnar side.  The venous graft  was tunneled towards the  radial side.  Once the tunnel had been  completed, the patient was systemically heparinized.  An end-to-side  anastomosis was first created on the artery.  The artery was occluded  proximally and distally with vascular clamps.  The artery was opened  with a #11 blade and extended with the Potts scissors.  The artery was  adequate caliber.  A running anastomosis with 6-0 Prolene was then  performed.  The brachial artery was flushed in antegrade and retrograde  following completion of the anastomosis.  The anastomosis was  hemostatic.  Next, attention was turned towards the venous end.  The  vessel was occluded with Vesseloops.  It was opened with a #11 blade and  extended with a Potts scissors.  The graft was cut to the appropriate  dimension and the venous anastomosis was then created with a running 6-0  Prolene.  Prior to completion of the anastomosis, the graft was flushed  in antegrade fashion.  There was brisk pulsatile bleeding.  The graft  had a palpable thrill at the end of the case.  The Doppler signal was  used to evaluate flow within the basilic  vein up to the level of the  axilla, which was adequate.  The Doppler was also used to evaluate the  radial and ulnar arteries, which had normal signals.  Next, the wound  was copiously irrigated.  The subcutaneous tissue was closed in multiple  layers.  The skin was closed with a running 4-0 Vicryl.  Dermabond was  placed.  The patient was then taken to the recovery room in stable  condition.  There were no immediate complications.      Todd Ny, MD  Electronically Signed     VWB/MEDQ  D:  09/03/2006  T:  09/03/2006  Job:  [id number]

## 2010-05-16 NOTE — Op Note (Signed)
NAMECAREY, JOHNDROW              ACCOUNT NO.:  [id number]   MEDICAL RECORD NO.:  [id number]          PATIENT TYPE:  AMB   LOCATION:  SDS                          FACILITY:  [facility]   PHYSICIAN:  Juleen China IV, MDDATE OF BIRTH:  [birth year]   DATE OF PROCEDURE:  06/18/2007  DATE OF DISCHARGE:                               OPERATIVE REPORT   PREOPERATIVE DIAGNOSIS:  Right hand steal syndrome.   POSTOPERATIVE DIAGNOSIS:  Right hand steal syndrome.   PROCEDURE PERFORMED:  Ligation of right forearm graft.   TYPE OF ANESTHESIA:  MAC.   BLOOD LOSS:  Minimal.   COMPLICATIONS:  None.   SPECIMENS:  None.   PROCEDURE:  The patient was identified in the holding area and taken to  the room, where he was placed supine on the table.  The right arm was  prepped and draped in a standard sterile fashion.  A time-out was called  and antibiotics were given.  An incision was made just above the  antecubital crease over the arterial end of the graft.  Cautery was used  to dissect the subcutaneous tissue.  The graft was then exposed and  circumferentially isolated.  It was mobilized proximally and distally.  Clamps were placed proximally and distally and the graft was transected.  Each end was oversewn with a Gore-Tex suture CV-5 in a mattress fashion.  Once each end had been ligated, the clamps were released.  Hemostasis  was achieved.  The wound was irrigated.  The subcutaneous tissue was  closed with running 3-0 Vicryl and skin was closed with 4-0 Vicryl.  Dermabond was placed.  The patient tolerated procedure well.  There were  no complications.           ______________________________  V. Charlena Cross, MD  Electronically Signed     VWB/MEDQ  D:  06/18/2007  T:  06/18/2007  Job:  [id number]

## 2010-05-16 NOTE — Op Note (Signed)
[name], [name]              ACCOUNT NO.:  [id number]   MEDICAL RECORD NO.:  [id number]          PATIENT TYPE:  INP   LOCATION:  5528                         FACILITY:  [facility]   PHYSICIAN:  Anselmo Rod, M.D.  DATE OF BIRTH:  12-Apr-1945   DATE OF PROCEDURE:  08/31/2006  DATE OF DISCHARGE:                               OPERATIVE REPORT   PROCEDURE PERFORMED:  Esophagogastroduodenoscopy with cold biopsies x 3.   ENDOSCOPIST:  Anselmo Rod, M.D.   INSTRUMENT USED:  Pentax video panendoscope.   INDICATIONS FOR PROCEDURE:  A 66 year old white male with multiple  medical problems including chronic renal failure and adult-onset  diabetes, hypertension, stroke, with a history of nausea and vomiting,  undergoing an EGD.  The patient has mild anemia.   PREPROCEDURE PREPARATION:  Informed consent was procured from the  patient.  The risks and benefits of the procedure were discussed with  him.  The patient was fasted for 8 hours prior to the procedure.   PREPROCEDURE PHYSICAL:  The patient had stable vital signs.  NECK:  Supple.  CHEST:  Clear to auscultation.  S1-S2 regular.  ABDOMEN:  Soft with normal bowel sounds.   DESCRIPTION OF PROCEDURE:  The patient was placed in left lateral  decubitus position and sedated with 35 mcg of Fentanyl and 5 mg of  Versed given intravenously in slow incremental doses. Once the patient  was adequately sedated and maintained on low-flow oxygen and continuous  cardiac monitoring, the Pentax video panendoscope was advanced through  the mouthpiece over the tongue, into the esophagus under direct vision.  Two patches of what appeared to be Barrett's-like mucosa were seen just  below the upper esophageal sphincter.  These were not biopsied as I did  not want confuse the patient's acute presentation.  The rest of the  esophagus appeared had widely patent.  The scope was then advanced in  the stomach.  Multiple erosions were noted in the mid body  and antrum  overlying small nodular lesions of unclear significance.  Three cold  biopsies were done from these areas. There was some old heme in the mid  body and fresh heme in the antrum from these erosions.  Retroflexion in  the high cardia revealed no evidence of a hiatal hernia. The proximal  small bowel appeared normal. The patient tolerated the procedure well  without immediate complications.   IMPRESSION:  1. Question Barrett's mucosa just below the upper esophageal      sphincter, no biopsies done at the present time.  2. Widely patent esophagus.  3. Multiple erosions in mid body and antrum. Biopsies done for      pathology.  4. Normal proximal small bowel.   RECOMMENDATIONS:  1. Await pathology results.  2. Continue PPIs.  3. Avoid all nonsteroidals.  4. Resume previous diet.  5. Further recommendations to be made in follow-up.      Anselmo Rod, M.D.  Electronically Signed     JNM/MEDQ  D:  08/31/2006  T:  09/01/2006  Job:  [id number]   cc:   Jordan Hawks. Elnoria Howard, MD  Valetta Close, M.D.

## 2010-06-21 ENCOUNTER — Emergency Department (HOSPITAL_COMMUNITY): Payer: Medicare Other

## 2010-06-21 ENCOUNTER — Emergency Department (HOSPITAL_COMMUNITY)
Admission: EM | Admit: 2010-06-21 | Discharge: 2010-06-21 | Disposition: A | Discharge status: Home / Self Care | Payer: Medicare Other | Attending: Emergency Medicine | Admitting: Emergency Medicine

## 2010-06-21 ENCOUNTER — Encounter (HOSPITAL_COMMUNITY): Payer: Self-pay

## 2010-06-21 DIAGNOSIS — Z992 Dependence on renal dialysis: Secondary | ICD-10-CM | POA: Insufficient documentation

## 2010-06-21 DIAGNOSIS — E119 Type 2 diabetes mellitus without complications: Secondary | ICD-10-CM | POA: Insufficient documentation

## 2010-06-21 DIAGNOSIS — H547 Unspecified visual loss: Secondary | ICD-10-CM | POA: Insufficient documentation

## 2010-06-21 DIAGNOSIS — D649 Anemia, unspecified: Secondary | ICD-10-CM | POA: Insufficient documentation

## 2010-06-21 DIAGNOSIS — K3184 Gastroparesis: Secondary | ICD-10-CM | POA: Insufficient documentation

## 2010-06-21 DIAGNOSIS — I509 Heart failure, unspecified: Secondary | ICD-10-CM | POA: Insufficient documentation

## 2010-06-21 DIAGNOSIS — R10812 Left upper quadrant abdominal tenderness: Secondary | ICD-10-CM | POA: Insufficient documentation

## 2010-06-21 DIAGNOSIS — R1012 Left upper quadrant pain: Secondary | ICD-10-CM | POA: Insufficient documentation

## 2010-06-21 DIAGNOSIS — Z79899 Other long term (current) drug therapy: Secondary | ICD-10-CM | POA: Insufficient documentation

## 2010-06-21 DIAGNOSIS — R112 Nausea with vomiting, unspecified: Secondary | ICD-10-CM | POA: Insufficient documentation

## 2010-06-21 DIAGNOSIS — Z794 Long term (current) use of insulin: Secondary | ICD-10-CM | POA: Insufficient documentation

## 2010-06-21 DIAGNOSIS — N186 End stage renal disease: Secondary | ICD-10-CM | POA: Insufficient documentation

## 2010-06-21 HISTORY — DX: Heart failure, unspecified: I50.9

## 2010-06-21 LAB — BASIC METABOLIC PANEL
BUN: 27 mg/dL — ABNORMAL HIGH (ref 6–23)
Chloride: 95 mEq/L — ABNORMAL LOW (ref 96–112)
GFR calc Af Amer: 8 mL/min — ABNORMAL LOW (ref >60)
GFR calc non Af Amer: 7 mL/min — ABNORMAL LOW (ref >60)
Potassium: 3.6 mEq/L (ref 3.5–5.1)
Sodium: 137 mEq/L (ref 135–145)

## 2010-06-21 LAB — DIFFERENTIAL
Basophils Absolute: 0.1 10*3/uL (ref 0.0–0.1)
Basophils Relative: 1 % (ref 0–1)
Eosinophils Relative: 2 % (ref 0–5)
Lymphocytes Relative: 25 % (ref 12–46)
Neutro Abs: 5.5 10*3/uL (ref 1.7–7.7)

## 2010-06-21 LAB — CBC
HCT: 32.2 % — ABNORMAL LOW (ref 39.0–52.0)
Hemoglobin: 10.6 g/dL — ABNORMAL LOW (ref 13.0–17.0)
MCHC: 32.9 g/dL (ref 30.0–36.0)
RDW: 13.4 % (ref 11.5–15.5)
WBC: 8.6 10*3/uL (ref 4.0–10.5)

## 2010-06-21 LAB — HEPATIC FUNCTION PANEL
ALT: 8 U/L (ref 0–53)
AST: 8 U/L (ref 0–37)
Bilirubin, Direct: 0.1 mg/dL (ref 0.0–0.3)
Total Bilirubin: 0.4 mg/dL (ref 0.3–1.2)

## 2010-06-21 MED ORDER — IOHEXOL 300 MG/ML  SOLN
100.0000 mL | Freq: Once | INTRAMUSCULAR | Status: AC | PRN
  Administered 2010-06-21: 100 mL via INTRAVENOUS

## 2010-09-28 LAB — POCT I-STAT 4, (NA,K, GLUC, HGB,HCT)
Glucose, Bld: 236 — ABNORMAL HIGH
HCT: 41
Hemoglobin: 13.9

## 2010-10-13 LAB — IRON AND TIBC
Iron: 82
Saturation Ratios: 36
TIBC: 225
UIBC: 143

## 2010-10-13 LAB — RENAL FUNCTION PANEL
Albumin: 2.3 — ABNORMAL LOW
Albumin: 2.4 — ABNORMAL LOW
BUN: 64 — ABNORMAL HIGH
BUN: 108 — ABNORMAL HIGH
BUN: 109 — ABNORMAL HIGH
BUN: 197 — ABNORMAL HIGH
BUN: 204 — ABNORMAL HIGH
BUN: 70 — ABNORMAL HIGH
CO2: 25
CO2: 21
CO2: 22
CO2: 24
Calcium: 8.1 — ABNORMAL LOW
Calcium: 8.2 — ABNORMAL LOW
Chloride: 106
Chloride: 100
Chloride: 100
Chloride: 101
Chloride: 98
Creatinine, Ser: 6.99 — ABNORMAL HIGH
Creatinine, Ser: 10.52 — ABNORMAL HIGH
Creatinine, Ser: 7.38 — ABNORMAL HIGH
Creatinine, Ser: 7.82 — ABNORMAL HIGH
Creatinine, Ser: 7.87 — ABNORMAL HIGH
Glucose, Bld: 116 — ABNORMAL HIGH
Glucose, Bld: 174 — ABNORMAL HIGH
Glucose, Bld: 181 — ABNORMAL HIGH
Glucose, Bld: 237 — ABNORMAL HIGH
Glucose, Bld: 273 — ABNORMAL HIGH
Phosphorus: 7.8 — ABNORMAL HIGH
Potassium: 4.1
Potassium: 3.9
Potassium: 4.8
Potassium: 5.1
Sodium: 142

## 2010-10-13 LAB — LIPID PANEL
HDL: 24 — ABNORMAL LOW
LDL Cholesterol: 112 — ABNORMAL HIGH
Total CHOL/HDL Ratio: 7.6
Triglycerides: 231 — ABNORMAL HIGH
VLDL: 46 — ABNORMAL HIGH

## 2010-10-13 LAB — HEMOGLOBIN A1C
Hgb A1c MFr Bld: 9.3 — ABNORMAL HIGH
Mean Plasma Glucose: 254

## 2010-10-13 LAB — PROTEIN ELECTROPH W RFLX QUANT IMMUNOGLOBULINS
Alpha-1-Globulin: 7.1 — ABNORMAL HIGH
Alpha-2-Globulin: 20.2 — ABNORMAL HIGH
Beta 2: 5.4
Gamma Globulin: 9.2 — ABNORMAL LOW
M-Spike, %: NOT DETECTED

## 2010-10-13 LAB — CBC
HCT: 29.2 — ABNORMAL LOW
HCT: 33.8 — ABNORMAL LOW
HCT: 35.9 — ABNORMAL LOW
HCT: 36.9 — ABNORMAL LOW
Hemoglobin: 9.7 — ABNORMAL LOW
Hemoglobin: 11.7 — ABNORMAL LOW
Hemoglobin: 12.2 — ABNORMAL LOW
Hemoglobin: 13.4
MCHC: 33.7
MCHC: 33.8
MCHC: 34
MCHC: 34.1
MCV: 84.6
MCV: 83.3
MCV: 84.9
Platelets: 322
Platelets: 366
Platelets: 464 — ABNORMAL HIGH
Platelets: 480 — ABNORMAL HIGH
RBC: 4.08 — ABNORMAL LOW
RBC: 4.26
RDW: 12.5
RDW: 12.5
RDW: 12.8
RDW: 12.8
WBC: 12.5 — ABNORMAL HIGH
WBC: 13.1 — ABNORMAL HIGH

## 2010-10-13 LAB — DIFFERENTIAL
Basophils Absolute: 0
Basophils Relative: 0
Basophils Relative: 1
Eosinophils Relative: 2
Lymphocytes Relative: 19
Lymphocytes Relative: 19
Monocytes Absolute: 0.5
Monocytes Absolute: 0.8 — ABNORMAL HIGH
Monocytes Relative: 7
Neutro Abs: 8.1 — ABNORMAL HIGH
Neutro Abs: 8.8 — ABNORMAL HIGH

## 2010-10-13 LAB — PTH, INTACT AND CALCIUM
Calcium, Total (PTH): 8.1 — ABNORMAL LOW
PTH: 781.5 — ABNORMAL HIGH

## 2010-10-13 LAB — UIFE/LIGHT CHAINS/TP QN, 24-HR UR
Albumin, U: DETECTED
Alpha 2, Urine: DETECTED — AB
Beta, Urine: DETECTED — AB
Free Lambda Lt Chains,Ur: 3.3 — ABNORMAL HIGH (ref 0.08–1.01)
Total Protein, Urine: 259.5

## 2010-10-13 LAB — PHOSPHORUS: Phosphorus: 8.4 — ABNORMAL HIGH

## 2010-10-13 LAB — HEPATIC FUNCTION PANEL
AST: 10
Albumin: 3.1 — ABNORMAL LOW
Bilirubin, Direct: <0.1
Total Bilirubin: 0.7

## 2010-10-13 LAB — URINE CULTURE: Special Requests: NEGATIVE

## 2010-10-13 LAB — BLOOD GAS, ARTERIAL
Acid-base deficit: 8.3 — ABNORMAL HIGH
Patient temperature: 98.6
TCO2: 17.1

## 2010-10-13 LAB — BASIC METABOLIC PANEL
BUN: 91 — ABNORMAL HIGH
Calcium: 8.4
GFR calc non Af Amer: 6 — ABNORMAL LOW
Potassium: 4
Sodium: 138

## 2010-10-13 LAB — PROTIME-INR: INR: 1.1

## 2010-10-13 LAB — TSH: TSH: 0.883

## 2010-10-13 LAB — LIPASE, BLOOD: Lipase: 36

## 2010-10-13 LAB — IMMUNOFIXATION ADD-ON

## 2010-10-13 LAB — FERRITIN: Ferritin: 477 — ABNORMAL HIGH (ref 22–322)

## 2010-10-13 LAB — URINALYSIS, ROUTINE W REFLEX MICROSCOPIC
Bilirubin Urine: NEGATIVE
Glucose, UA: 250 — AB
Ketones, ur: NEGATIVE
Protein, ur: >300 — AB
pH: 5

## 2010-10-13 LAB — CARDIAC PANEL(CRET KIN+CKTOT+MB+TROPI)
CK, MB: 10 — ABNORMAL HIGH
Relative Index: 4.3 — ABNORMAL HIGH
Relative Index: 4.4 — ABNORMAL HIGH
Troponin I: 0.05
Troponin I: 0.05

## 2010-10-13 LAB — HEPATITIS B SURFACE ANTIGEN: Hepatitis B Surface Ag: NEGATIVE

## 2010-10-13 LAB — APTT: aPTT: 33

## 2010-10-13 LAB — POCT I-STAT 4, (NA,K, GLUC, HGB,HCT)
Potassium: 3.6
Sodium: 139

## 2010-10-13 LAB — OCCULT BLOOD X 1 CARD TO LAB, STOOL: Fecal Occult Bld: POSITIVE

## 2010-10-13 LAB — URINE MICROSCOPIC-ADD ON

## 2010-10-13 LAB — IGG, IGA, IGM
IgA: 116
IgG (Immunoglobin G), Serum: 613 — ABNORMAL LOW

## 2011-04-11 IMAGING — CR DG CHEST 2V
1 series · 1 of 1 positions shown · non-contrast
Comparison: 08/23/2009

CLINICAL DATA: Cold symptoms.  Altered mental status.

CHEST - 2 VIEW

[view not recorded]
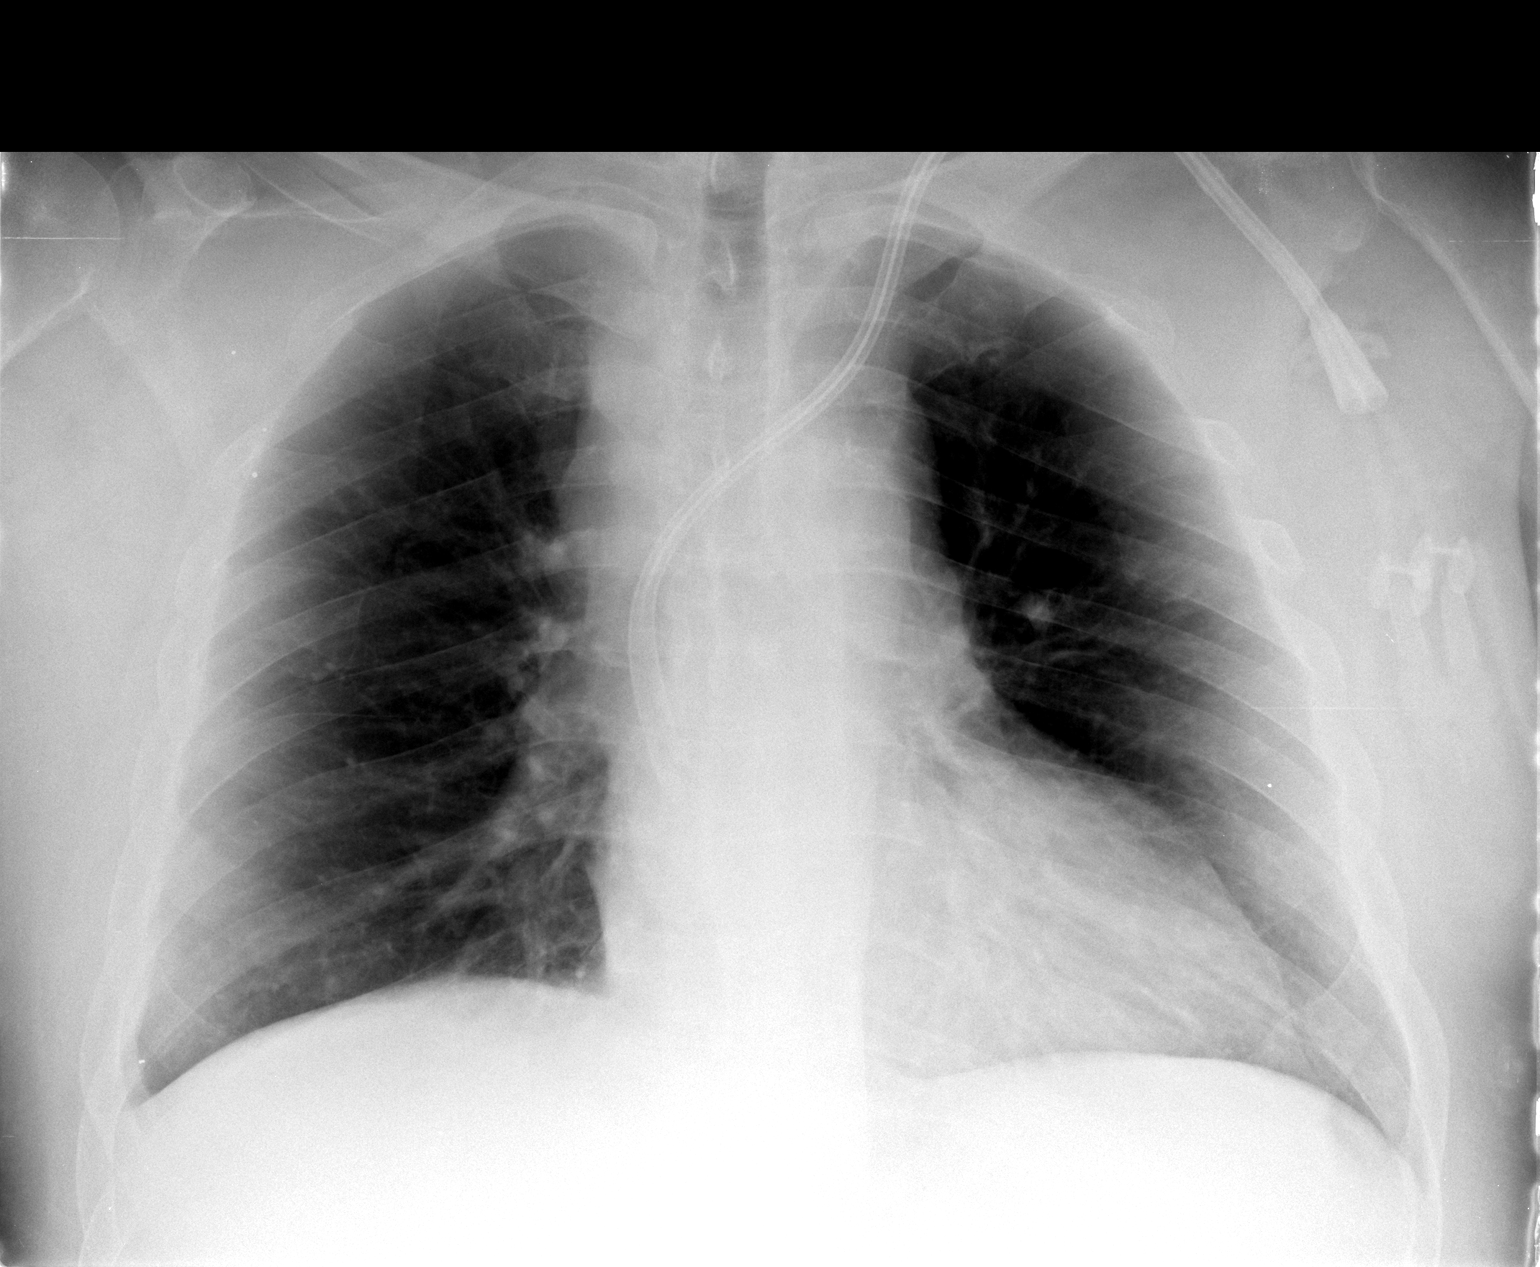

[1 of 1 positions shown; findings below may reference images not displayed]

FINDINGS: A left-sided internal jugular dialysis catheter is in
place.  The lungs are mildly hyperexpanded but clear.  The heart is
mildly enlarged but unchanged.  There is no pulmonary edema.  The
upper abdomen and osseous structures are unchanged.
IMPRESSION: Mild hyperexpansion without acute cardiopulmonary disease.

## 2011-04-11 IMAGING — CT CT HEAD W/O CM
1 of 2 series · 13 of 30 positions shown, 17 images · non-contrast
Comparison: 08/23/2009

CLINICAL DATA: Altered mental status.

CT HEAD WITHOUT CONTRAST
TECHNIQUE: Contiguous axial images were obtained from the base of
the skull through the vertex without contrast.

[Series 2: brain · axial · 0.47mm/px · z∈[+175,+309]mm · 13 of 32 slices shown, 17 images]
[im 3/32  brain]
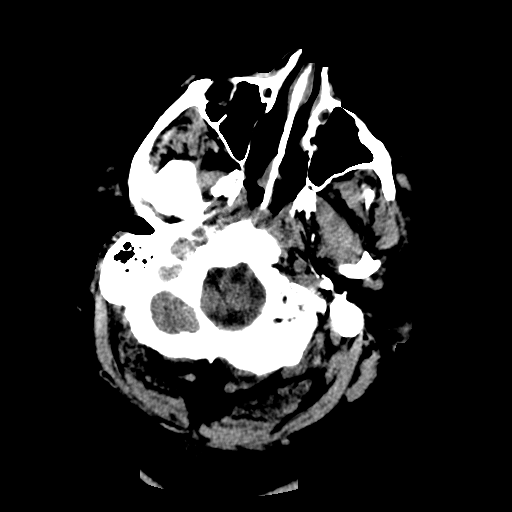
[im 3/32  bone]
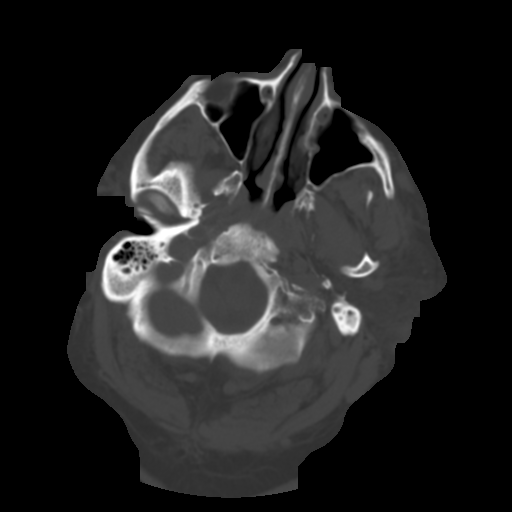
[im 5/32  brain]
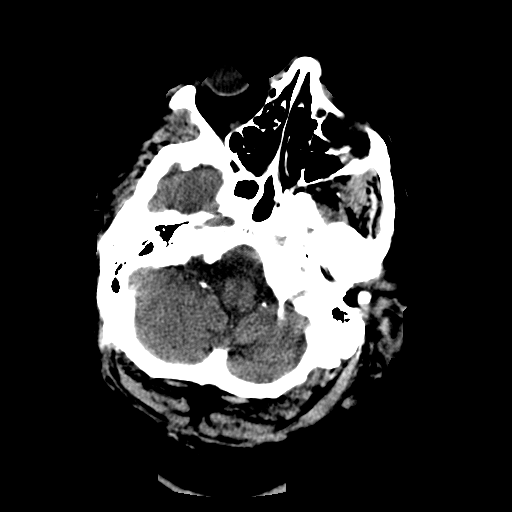
[im 7/32  brain]
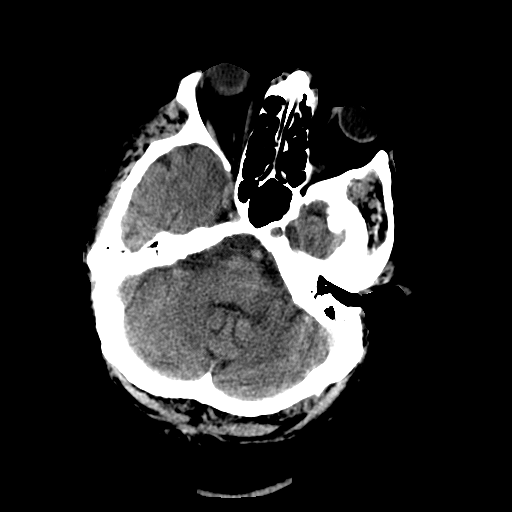
[im 9/32  brain]
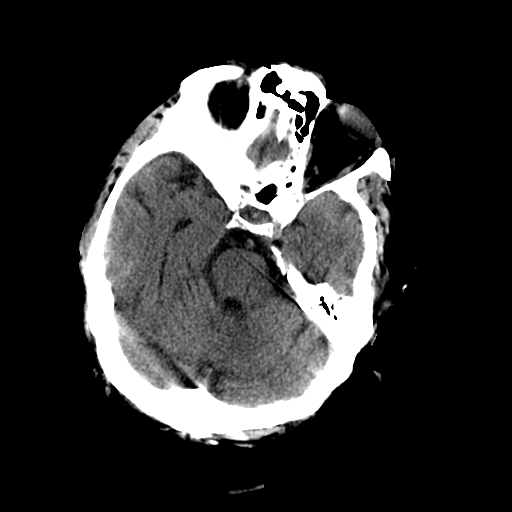
[im 12/32  brain]
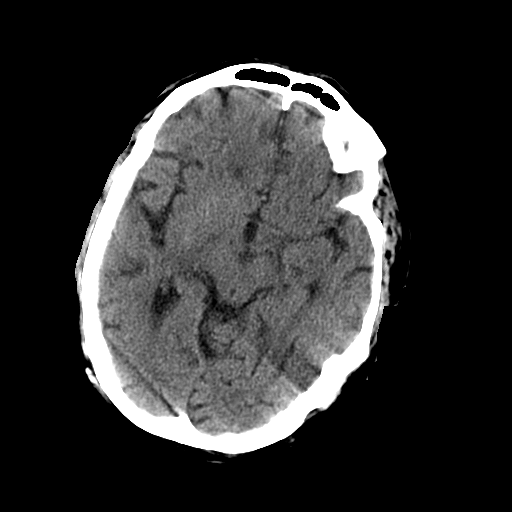
[im 12/32  bone]
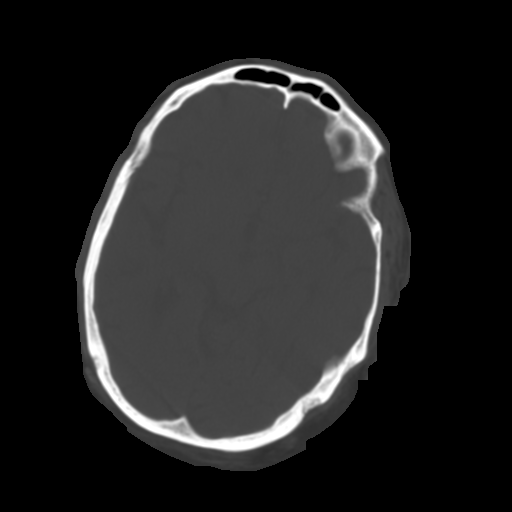
[im 14/32  brain]
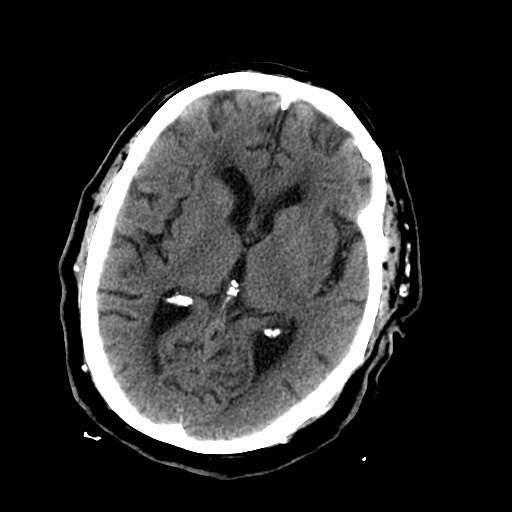
[im 16/32  brain]
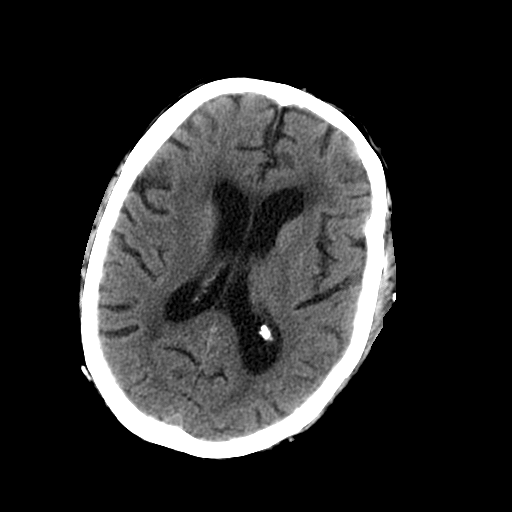
[im 18/32  brain]
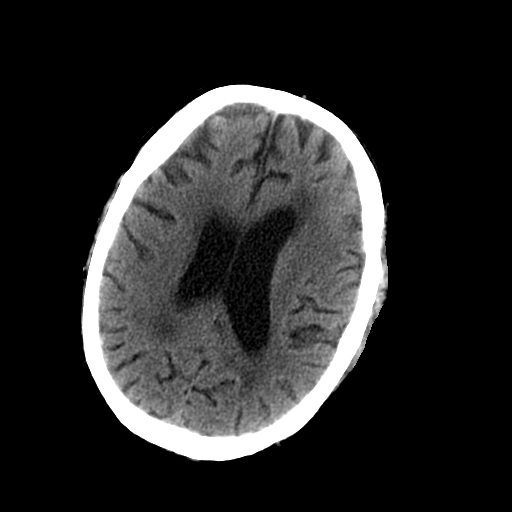
[im 20/32  brain]
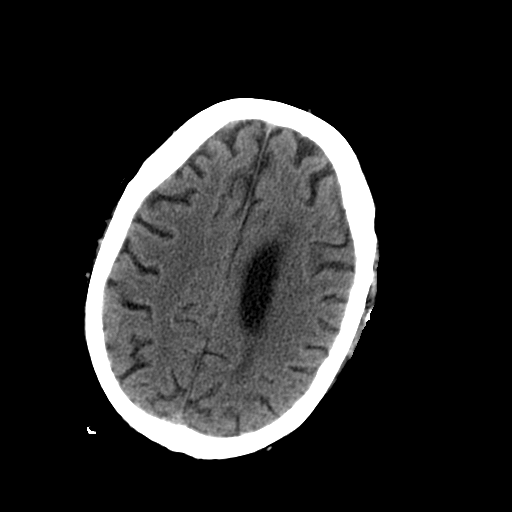
[im 20/32  bone]
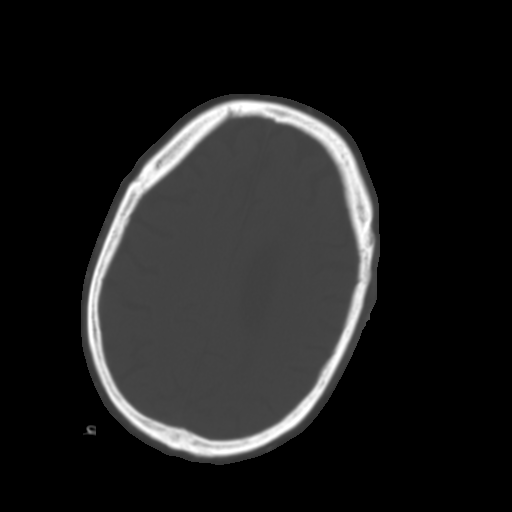
[im 23/32  brain]
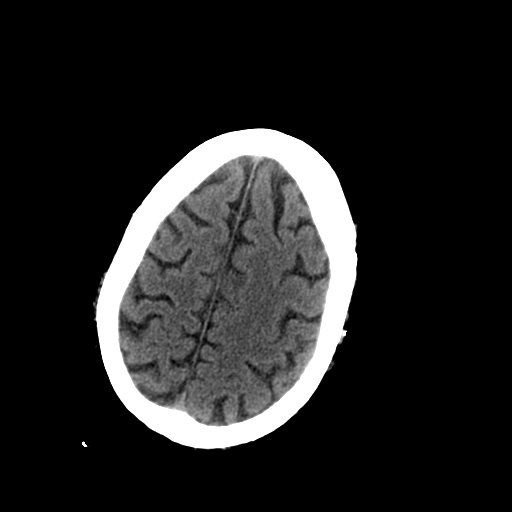
[im 25/32  brain]
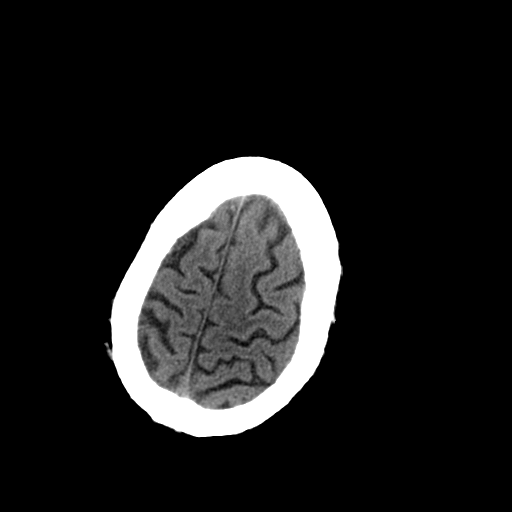
[im 27/32  brain]
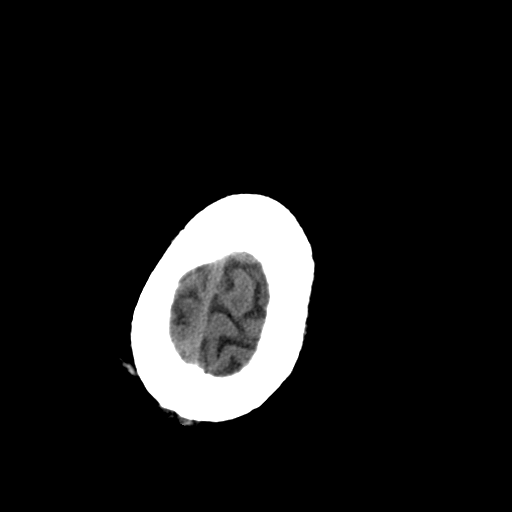
[im 29/32  brain]
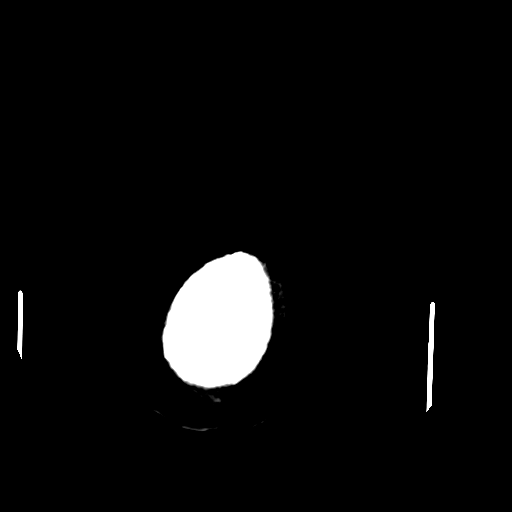
[im 29/32  bone]
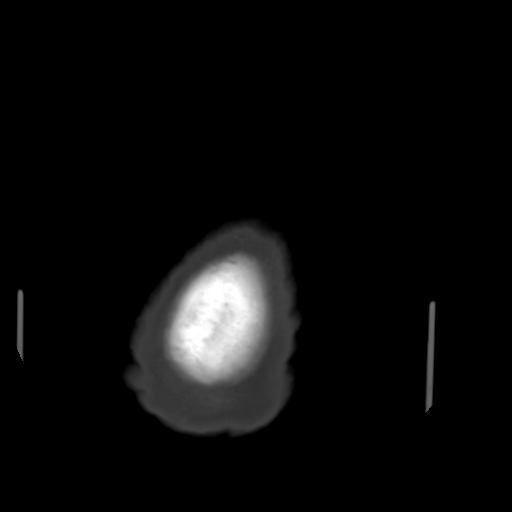

[13 of 30 positions shown; findings below may reference images not displayed]

FINDINGS: Confluent areas of hypoattenuation are seen in the
subcortical and periventricular white matter compatible with
chronic microvascular ischemic change/leukoaraiosis.  A remote
lacunar infarct is seen in the left basal ganglia.  There is no
evidence of acute infarct, hemorrhage, mass lesion, midline shift,
hydrocephalus or extra-axial fluid collections.  The orbits are
unremarkable.  The calvarium is intact the visualized paranasal
sinuses are clear.
IMPRESSION: Chronic microvascular ischemic changes without acute findings.

## 2011-07-12 ENCOUNTER — Other Ambulatory Visit (HOSPITAL_COMMUNITY): Payer: Self-pay | Admitting: Internal Medicine

## 2011-07-12 DIAGNOSIS — N186 End stage renal disease: Secondary | ICD-10-CM

## 2011-07-13 ENCOUNTER — Encounter (HOSPITAL_COMMUNITY): Payer: Self-pay

## 2011-07-13 ENCOUNTER — Ambulatory Visit (HOSPITAL_COMMUNITY)
Admission: RE | Admit: 2011-07-13 | Discharge: 2011-07-13 | Disposition: A | Discharge status: Home / Self Care | Payer: Medicare Other | Source: Ambulatory Visit | Attending: Internal Medicine | Admitting: Internal Medicine

## 2011-07-13 VITALS — BP 118/64 | HR 69 | Temp 98.1°F

## 2011-07-13 DIAGNOSIS — L299 Pruritus, unspecified: Secondary | ICD-10-CM | POA: Insufficient documentation

## 2011-07-13 DIAGNOSIS — N186 End stage renal disease: Secondary | ICD-10-CM | POA: Insufficient documentation

## 2011-07-13 DIAGNOSIS — Y849 Medical procedure, unspecified as the cause of abnormal reaction of the patient, or of later complication, without mention of misadventure at the time of the procedure: Secondary | ICD-10-CM | POA: Insufficient documentation

## 2011-07-13 DIAGNOSIS — T82898A Other specified complication of vascular prosthetic devices, implants and grafts, initial encounter: Secondary | ICD-10-CM | POA: Insufficient documentation

## 2011-07-13 DIAGNOSIS — E119 Type 2 diabetes mellitus without complications: Secondary | ICD-10-CM | POA: Insufficient documentation

## 2011-07-13 MED ORDER — DIPHENHYDRAMINE HCL 50 MG/ML IJ SOLN: 25.0000 mg | Freq: Once | INTRAMUSCULAR | Status: DC

## 2011-07-13 MED ORDER — CHLORHEXIDINE GLUCONATE 4 % EX LIQD
CUTANEOUS | Status: AC
  Filled 2011-07-13: qty 30

## 2011-07-13 MED ORDER — SODIUM CHLORIDE 0.9 % IV SOLN: Freq: Once | INTRAVENOUS | Status: DC

## 2011-07-13 MED ORDER — HEPARIN SODIUM (PORCINE) 1000 UNIT/ML IJ SOLN
INTRAMUSCULAR | Status: AC
  Filled 2011-07-13: qty 1

## 2011-07-13 MED ORDER — DIPHENHYDRAMINE HCL 50 MG/ML IJ SOLN
INTRAMUSCULAR | Status: AC
  Administered 2011-07-13: 25 mg
  Filled 2011-07-13: qty 1

## 2011-07-13 MED ORDER — VANCOMYCIN HCL IN DEXTROSE 1-5 GM/200ML-% IV SOLN
1000.0000 mg | INTRAVENOUS | Status: AC
  Administered 2011-07-13: 1000 mg via INTRAVENOUS
  Filled 2011-07-13: qty 200

## 2011-07-13 NOTE — Procedures (Signed)
Successful exchange of left jugular tunneled dialysis catheter.   Tip at cavoatrial junction and ready to use.

## 2011-07-13 NOTE — H&P (Signed)
[name] is an 66 y.o. male.   Chief Complaint: Dialysis catheter malfunctioning - collapses in use Placement 10/2009 by Dr Fredia Sorrow Scheduled now for exchange HPI: ESRD; CHF; DM; legally blind  Past Medical History  Diagnosis Date  . Renal insufficiency   . CHF (congestive heart failure)   . Diabetes mellitus   . Blind t    No past surgical history on file.  No family history on file. Social History:  does not have a smoking history on file. He does not have any smokeless tobacco history on file. His alcohol and drug histories not on file.  Allergies:  Allergies  Allergen Reactions  . Penicillins      (Not in a hospital admission)  Results for orders placed during the hospital encounter of 07/13/11 (from the past 48 hour(s))  GLUCOSE, CAPILLARY     Status: Abnormal   Collection Time   07/13/11  7:27 AM      Component Value Range Comment   Glucose-Capillary 102 (*) 70 - 99 mg/dL    No results found.  Review of Systems  Constitutional: Negative for fever.  Cardiovascular: Negative for chest pain.  Gastrointestinal: Negative for nausea and vomiting.  Neurological: Negative for headaches.       Uses wc    There were no vitals taken for this visit. Physical Exam  Constitutional: He is oriented to person, place, and time. He appears well-developed.  Cardiovascular: Normal rate, regular rhythm and normal heart sounds.   No murmur heard. Respiratory: Effort normal and breath sounds normal. He has no wheezes.  GI: Soft. Bowel sounds are normal. There is no tenderness.  Musculoskeletal: Normal range of motion. He exhibits no edema.       Pt unable to stand for long; dizziness  Neurological: He is alert and oriented to person, place, and time. He has normal reflexes.  Psychiatric: He has a normal mood and affect. His behavior is normal. Judgment and thought content normal.     Assessment/Plan Existing hemodialysis catheter placed 10/2009 Has functioned well  until recently - collapsing Scheduled now for dialysis catheter exchange Pt aware of procedure benefits and risks and agreeable to proceed Consent signed and in chart  Rt side was considered last visit for HD cath, but was found to have a tiny Rt IJ vein with some clotting noted; not amenable to new catheter per Dr Maris Berger A 07/13/2011, 7:41 AM

## 2011-07-16 ENCOUNTER — Telehealth (HOSPITAL_COMMUNITY): Payer: Self-pay | Admitting: *Deleted

## 2011-07-16 NOTE — Telephone Encounter (Signed)
No answer, VM left to return call with any questions or concerns  

## 2011-10-30 ENCOUNTER — Inpatient Hospital Stay (HOSPITAL_COMMUNITY)
Admission: AD | Admit: 2011-10-30 | Discharge: 2011-11-04 | DRG: 193 | Disposition: A | Discharge status: Home / Self Care | Payer: Medicare Other | Source: Other Acute Inpatient Hospital | Attending: Internal Medicine | Admitting: Internal Medicine

## 2011-10-30 ENCOUNTER — Encounter (HOSPITAL_COMMUNITY): Payer: Self-pay | Admitting: Internal Medicine

## 2011-10-30 ENCOUNTER — Encounter: Payer: Self-pay | Admitting: Internal Medicine

## 2011-10-30 DIAGNOSIS — I953 Hypotension of hemodialysis: Secondary | ICD-10-CM | POA: Diagnosis present

## 2011-10-30 DIAGNOSIS — D649 Anemia, unspecified: Secondary | ICD-10-CM

## 2011-10-30 DIAGNOSIS — Z7982 Long term (current) use of aspirin: Secondary | ICD-10-CM

## 2011-10-30 DIAGNOSIS — N186 End stage renal disease: Secondary | ICD-10-CM

## 2011-10-30 DIAGNOSIS — Y9229 Other specified public building as the place of occurrence of the external cause: Secondary | ICD-10-CM

## 2011-10-30 DIAGNOSIS — E119 Type 2 diabetes mellitus without complications: Secondary | ICD-10-CM | POA: Diagnosis present

## 2011-10-30 DIAGNOSIS — H547 Unspecified visual loss: Secondary | ICD-10-CM

## 2011-10-30 DIAGNOSIS — Y841 Kidney dialysis as the cause of abnormal reaction of the patient, or of later complication, without mention of misadventure at the time of the procedure: Secondary | ICD-10-CM | POA: Diagnosis present

## 2011-10-30 DIAGNOSIS — Z87891 Personal history of nicotine dependence: Secondary | ICD-10-CM

## 2011-10-30 DIAGNOSIS — E1169 Type 2 diabetes mellitus with other specified complication: Secondary | ICD-10-CM | POA: Diagnosis present

## 2011-10-30 DIAGNOSIS — Z79899 Other long term (current) drug therapy: Secondary | ICD-10-CM

## 2011-10-30 DIAGNOSIS — H543 Unqualified visual loss, both eyes: Secondary | ICD-10-CM | POA: Diagnosis present

## 2011-10-30 DIAGNOSIS — I509 Heart failure, unspecified: Secondary | ICD-10-CM | POA: Diagnosis present

## 2011-10-30 DIAGNOSIS — E669 Obesity, unspecified: Secondary | ICD-10-CM | POA: Diagnosis present

## 2011-10-30 DIAGNOSIS — I959 Hypotension, unspecified: Secondary | ICD-10-CM

## 2011-10-30 DIAGNOSIS — I429 Cardiomyopathy, unspecified: Secondary | ICD-10-CM

## 2011-10-30 DIAGNOSIS — I120 Hypertensive chronic kidney disease with stage 5 chronic kidney disease or end stage renal disease: Secondary | ICD-10-CM | POA: Diagnosis present

## 2011-10-30 DIAGNOSIS — D631 Anemia in chronic kidney disease: Secondary | ICD-10-CM | POA: Diagnosis present

## 2011-10-30 DIAGNOSIS — Z992 Dependence on renal dialysis: Secondary | ICD-10-CM

## 2011-10-30 DIAGNOSIS — I428 Other cardiomyopathies: Secondary | ICD-10-CM | POA: Diagnosis present

## 2011-10-30 DIAGNOSIS — I2510 Atherosclerotic heart disease of native coronary artery without angina pectoris: Secondary | ICD-10-CM | POA: Diagnosis present

## 2011-10-30 DIAGNOSIS — Z91199 Patient's noncompliance with other medical treatment and regimen due to unspecified reason: Secondary | ICD-10-CM

## 2011-10-30 DIAGNOSIS — Z880 Allergy status to penicillin: Secondary | ICD-10-CM

## 2011-10-30 DIAGNOSIS — Z9089 Acquired absence of other organs: Secondary | ICD-10-CM

## 2011-10-30 DIAGNOSIS — E860 Dehydration: Secondary | ICD-10-CM | POA: Diagnosis present

## 2011-10-30 DIAGNOSIS — Z794 Long term (current) use of insulin: Secondary | ICD-10-CM

## 2011-10-30 DIAGNOSIS — G5790 Unspecified mononeuropathy of unspecified lower limb: Secondary | ICD-10-CM | POA: Diagnosis present

## 2011-10-30 DIAGNOSIS — Z9119 Patient's noncompliance with other medical treatment and regimen: Secondary | ICD-10-CM

## 2011-10-30 DIAGNOSIS — Z6831 Body mass index (BMI) 31.0-31.9, adult: Secondary | ICD-10-CM

## 2011-10-30 DIAGNOSIS — J189 Pneumonia, unspecified organism: Principal | ICD-10-CM

## 2011-10-30 DIAGNOSIS — N2581 Secondary hyperparathyroidism of renal origin: Secondary | ICD-10-CM | POA: Diagnosis present

## 2011-10-30 LAB — CBC WITH DIFFERENTIAL/PLATELET
Basophils Absolute: 0.1 10*3/uL (ref 0.0–0.1)
Eosinophils Absolute: 0.1 10*3/uL (ref 0.0–0.7)
Lymphocytes Relative: 17 % (ref 12–46)
Lymphs Abs: 2.7 10*3/uL (ref 0.7–4.0)
MCH: 31 pg (ref 26.0–34.0)
Neutrophils Relative %: 75 % (ref 43–77)
Platelets: 203 10*3/uL (ref 150–400)
RBC: 3.36 MIL/uL — ABNORMAL LOW (ref 4.22–5.81)
RDW: 12.7 % (ref 11.5–15.5)
WBC: 15.8 10*3/uL — ABNORMAL HIGH (ref 4.0–10.5)

## 2011-10-30 LAB — COMPREHENSIVE METABOLIC PANEL
BUN: 46 mg/dL — ABNORMAL HIGH (ref 6–23)
CO2: 27 mEq/L (ref 19–32)
Chloride: 96 mEq/L (ref 96–112)
Creatinine, Ser: 7.82 mg/dL — ABNORMAL HIGH (ref 0.50–1.35)
GFR calc non Af Amer: 6 mL/min — ABNORMAL LOW (ref >90)
Total Bilirubin: 0.2 mg/dL — ABNORMAL LOW (ref 0.3–1.2)

## 2011-10-30 LAB — TROPONIN I: Troponin I: <0.3 ng/mL (ref <0.30)

## 2011-10-30 MED ORDER — LEVOFLOXACIN IN D5W 500 MG/100ML IV SOLN
500.0000 mg | INTRAVENOUS | Status: DC
  Administered 2011-11-01: 500 mg via INTRAVENOUS
  Filled 2011-10-30: qty 100

## 2011-10-30 MED ORDER — ROPINIROLE HCL 0.5 MG PO TABS
0.5000 mg | ORAL_TABLET | Freq: Every day | ORAL | Status: DC
  Administered 2011-10-30 – 2011-11-03 (×5): 0.5 mg via ORAL
  Filled 2011-10-30 (×7): qty 1

## 2011-10-30 MED ORDER — SODIUM CHLORIDE 0.9 % IJ SOLN
3.0000 mL | Freq: Two times a day (BID) | INTRAMUSCULAR | Status: DC
  Administered 2011-10-30 – 2011-11-04 (×10): 3 mL via INTRAVENOUS

## 2011-10-30 MED ORDER — INSULIN GLARGINE 100 UNIT/ML ~~LOC~~ SOLN
68.0000 [IU] | Freq: Every day | SUBCUTANEOUS | Status: DC
  Administered 2011-10-30 – 2011-11-01 (×3): 68 [IU] via SUBCUTANEOUS

## 2011-10-30 MED ORDER — AZTREONAM 1 G IJ SOLR
1.0000 g | INTRAMUSCULAR | Status: DC
  Filled 2011-10-30: qty 1

## 2011-10-30 MED ORDER — OXYCODONE HCL 5 MG PO TABS
10.0000 mg | ORAL_TABLET | Freq: Four times a day (QID) | ORAL | Status: DC | PRN
  Administered 2011-10-31 – 2011-11-04 (×8): 10 mg via ORAL
  Filled 2011-10-30 (×8): qty 2

## 2011-10-30 MED ORDER — LEVOFLOXACIN IN D5W 750 MG/150ML IV SOLN
750.0000 mg | Freq: Once | INTRAVENOUS | Status: AC
  Administered 2011-10-31: 750 mg via INTRAVENOUS
  Filled 2011-10-30 (×2): qty 150

## 2011-10-30 MED ORDER — ONDANSETRON HCL 4 MG/2ML IJ SOLN
4.0000 mg | Freq: Four times a day (QID) | INTRAMUSCULAR | Status: DC | PRN
  Administered 2011-10-31 – 2011-11-03 (×3): 4 mg via INTRAVENOUS
  Filled 2011-10-30 (×5): qty 2

## 2011-10-30 MED ORDER — ONDANSETRON HCL 4 MG PO TABS
4.0000 mg | ORAL_TABLET | Freq: Four times a day (QID) | ORAL | Status: DC | PRN
  Filled 2011-10-30: qty 25

## 2011-10-30 MED ORDER — ROPINIROLE HCL 0.25 MG PO TABS
0.2500 mg | ORAL_TABLET | Freq: Two times a day (BID) | ORAL | Status: DC
  Administered 2011-10-31 – 2011-11-04 (×9): 0.25 mg via ORAL
  Filled 2011-10-30 (×12): qty 1

## 2011-10-30 MED ORDER — DEXTROSE 5 % IV SOLN
1.0000 g | Freq: Once | INTRAVENOUS | Status: AC
  Administered 2011-10-30: 1 g via INTRAVENOUS
  Filled 2011-10-30: qty 1

## 2011-10-30 MED ORDER — DIALYVITE 3000 3 MG PO TABS: 1.0000 | ORAL_TABLET | Freq: Every day | ORAL | Status: DC

## 2011-10-30 MED ORDER — ACETAMINOPHEN 325 MG PO TABS: 650.0000 mg | ORAL_TABLET | Freq: Four times a day (QID) | ORAL | Status: DC | PRN

## 2011-10-30 MED ORDER — INSULIN ASPART 100 UNIT/ML ~~LOC~~ SOLN
0.0000 [IU] | Freq: Three times a day (TID) | SUBCUTANEOUS | Status: DC
Start: 2011-10-31 — End: 2011-11-04
  Administered 2011-10-31 – 2011-11-01 (×2): 4 [IU] via SUBCUTANEOUS
  Administered 2011-11-02: 3 [IU] via SUBCUTANEOUS

## 2011-10-30 MED ORDER — ACETAMINOPHEN 650 MG RE SUPP: 650.0000 mg | Freq: Four times a day (QID) | RECTAL | Status: DC | PRN

## 2011-10-30 MED ORDER — SIMVASTATIN 20 MG PO TABS
20.0000 mg | ORAL_TABLET | Freq: Every day | ORAL | Status: DC
  Administered 2011-10-31 – 2011-11-03 (×4): 20 mg via ORAL
  Filled 2011-10-30 (×5): qty 1

## 2011-10-30 MED ORDER — DEXTROSE 5 % IV SOLN: 2.0000 g | INTRAVENOUS | Status: DC

## 2011-10-30 MED ORDER — OXYCODONE HCL 5 MG PO TABS
5.0000 mg | ORAL_TABLET | Freq: Once | ORAL | Status: AC
  Administered 2011-10-30: 5 mg via ORAL
  Filled 2011-10-30: qty 1

## 2011-10-30 MED ORDER — HEPARIN SODIUM (PORCINE) 5000 UNIT/ML IJ SOLN
5000.0000 [IU] | Freq: Three times a day (TID) | INTRAMUSCULAR | Status: DC
  Administered 2011-10-30 – 2011-11-02 (×8): 5000 [IU] via SUBCUTANEOUS
  Filled 2011-10-30 (×11): qty 1

## 2011-10-30 MED ORDER — VANCOMYCIN HCL IN DEXTROSE 1-5 GM/200ML-% IV SOLN
1000.0000 mg | INTRAVENOUS | Status: DC
  Administered 2011-11-01: 1000 mg via INTRAVENOUS
  Filled 2011-10-30 (×4): qty 200

## 2011-10-30 MED ORDER — VANCOMYCIN HCL 1000 MG IV SOLR
2000.0000 mg | Freq: Once | INTRAVENOUS | Status: DC
  Filled 2011-10-30: qty 2000

## 2011-10-30 MED ORDER — SODIUM CHLORIDE 0.9 % IJ SOLN
3.0000 mL | Freq: Two times a day (BID) | INTRAMUSCULAR | Status: DC
  Administered 2011-10-31 – 2011-11-04 (×4): 3 mL via INTRAVENOUS

## 2011-10-30 MED ORDER — CINACALCET HCL 30 MG PO TABS
60.0000 mg | ORAL_TABLET | Freq: Every day | ORAL | Status: DC
  Administered 2011-10-30 – 2011-11-04 (×6): 60 mg via ORAL
  Filled 2011-10-30 (×6): qty 2

## 2011-10-30 MED ORDER — LEVOFLOXACIN IN D5W 750 MG/150ML IV SOLN
750.0000 mg | Freq: Once | INTRAVENOUS | Status: DC
  Filled 2011-10-30: qty 150

## 2011-10-30 MED ORDER — PANTOPRAZOLE SODIUM 40 MG PO TBEC
40.0000 mg | DELAYED_RELEASE_TABLET | Freq: Every day | ORAL | Status: DC
  Administered 2011-10-30 – 2011-11-04 (×6): 40 mg via ORAL
  Filled 2011-10-30 (×6): qty 1

## 2011-10-30 MED ORDER — SENNOSIDES-DOCUSATE SODIUM 8.6-50 MG PO TABS
2.0000 | ORAL_TABLET | Freq: Two times a day (BID) | ORAL | Status: DC | PRN
  Administered 2011-11-04: 2 via ORAL
  Filled 2011-10-30: qty 2

## 2011-10-30 MED ORDER — ASPIRIN 325 MG PO TABS
325.0000 mg | ORAL_TABLET | Freq: Every day | ORAL | Status: DC
  Administered 2011-10-30 – 2011-11-04 (×6): 325 mg via ORAL
  Filled 2011-10-30 (×6): qty 1

## 2011-10-30 MED ORDER — RENA-VITE PO TABS
1.0000 | ORAL_TABLET | Freq: Every day | ORAL | Status: DC
  Administered 2011-10-30 – 2011-10-31 (×2): 1 via ORAL
  Filled 2011-10-30 (×3): qty 1

## 2011-10-30 NOTE — Progress Notes (Signed)
ANTIBIOTIC CONSULT NOTE - INITIAL  Pharmacy Consult for Vancomycin, Cefepime, Levaquin Indication: pneumonia  Allergies  Allergen Reactions  . Penicillins Hives and Swelling    Patient Measurements: Height: 5' 11.5" (181.6 cm) Weight: 223 lb 5.2 oz (101.3 kg) IBW/kg (Calculated) : 76.45  Adjusted Body Weight:   Vital Signs: Temp: 97.8 F (36.6 C) (10/29 1930) Temp src: Oral (10/29 1930) BP: 112/55 mmHg (10/29 2006) Pulse Rate: 79  (10/29 2006) Intake/Output from previous day:   Intake/Output from this shift:    Labs:  [facility] 10/30/11 2129  WBC 15.8*  HGB 10.4*  PLT 203  LABCREA --  CREATININE 7.82*   Estimated Creatinine Clearance: 11.5 ml/min (by C-G formula based on Cr of 7.82). No results found for this basename: VANCOTROUGH:2,VANCOPEAK:2,VANCORANDOM:2,GENTTROUGH:2,GENTPEAK:2,GENTRANDOM:2,TOBRATROUGH:2,TOBRAPEAK:2,TOBRARND:2,AMIKACINPEAK:2,AMIKACINTROU:2,AMIKACIN:2, in the last 72 hours   Microbiology: No results found for this or any previous visit (from the past 720 hour(s)).  Medical History: Past Medical History  Diagnosis Date  . Renal insufficiency   . CHF (congestive heart failure)   . Diabetes mellitus   . Blind t    Medications:  Scheduled:    . aspirin  325 mg Oral Daily  . ceFEPime (MAXIPIME) IV  2 g Intravenous Q T,Th,Sa-HD  . cinacalcet  60 mg Oral Daily  . heparin  5,000 Units Subcutaneous Q8H  . insulin aspart  0-20 Units Subcutaneous TID WC  . insulin glargine  68 Units Subcutaneous QHS  . levofloxacin (LEVAQUIN) IV  500 mg Intravenous Q48H  . levofloxacin (LEVAQUIN) IV  750 mg Intravenous Once  . multivitamin  1 tablet Oral Daily  . oxyCODONE  5 mg Oral Once  . pantoprazole  40 mg Oral Daily  . rOPINIRole  0.25 mg Oral BID  . rOPINIRole  0.5 mg Oral QHS  . simvastatin  20 mg Oral q1800  . sodium chloride  3 mL Intravenous Q12H  . sodium chloride  3 mL Intravenous Q12H  . vancomycin  1,000 mg Intravenous Q T,Th,Sa-HD  .  DISCONTD: folic acid-vitamin b complex-vitamin c-selenium-zinc  1 tablet Oral Daily  . DISCONTD: levofloxacin (LEVAQUIN) IV  750 mg Intravenous Once  . DISCONTD: vancomycin  2,000 mg Intravenous Once   Assessment: 66 yr old male with ESRD on HD was brought to the ER at [facility] with hypotension and confusion following his HD today. It was determined that he may have pneumonia and he was given 1.5 Gm of Vancomycin and 2 Gm of Cefepime, then transferred to [facility]. The pt has a penicillin allergy and he does claim to be itching since he got the Cefepime. Labs are pending at this time.  Goal of Therapy:  Vancomycin trough level 15-20 mcg/ml  Plan:  1) Vancomycin 1000 mg after each HD (Got 1.5 Gm at [facility]. 2) Cefepime 2 Gm after each HD 3) Levaquin 750 mg once, then 500 mg q48 hrs.  Eugene Garnet 10/30/2011,10:27 PM

## 2011-10-30 NOTE — H&P (Addendum)
Patient is a 67 year old male with end-stage renal disease transferring from [facility] for hypotension. Apparently patient  has been having episodes of hypotension after dialysis, however this episode was pretty worse with the systolic being in the 60s. He was bolused with IV fluids and his blood pressure is consistently in the 100s systolic. He has a pneumonia on the chest x-ray. He is otherwise stable. He has been accepted to the step down unit. Need to call [facility] when patient gets here.  last dialysis was today

## 2011-10-30 NOTE — H&P (Signed)
[name] is an 66 y.o. male.   Patient was seen examined on October 30, 2011. Nephrologist and primary care - Dr. Hyman Hopes. Chief Complaint: Low blood pressure. HPI: 66 year-old male with history of ESRD on hemodialysis on Tuesday Thursdays and Saturday was brought to the ER at [facility] patient was found to be hypotensive and confused. Patient has been running low blood pressure for the last one month as per the patient and he has taken off antihypertensives. His blood pressure was found to be in the 70s which is lower than what he usually does. In the ER at [facility] patient was given a fluid bolus after which his blood pressure stayed stable 100 systolic and chest x-ray showed pneumonic process and blood work shows leukocytosis. Patient was transferred to [facility] cone for further management. Patient presently denies any chest pain or shortness of breath. Patient states 2 days ago he had nausea and vomiting which patient states has resolved and presently denies any abdominal pain or diarrhea and wants to eat something. Patient otherwise has no focal deficits headache fever chills.  Past Medical History  Diagnosis Date  . Renal insufficiency   . CHF (congestive heart failure)   . Diabetes mellitus   . Blind t    Past Surgical History  Procedure Date  . Appendectomy     Family History  Problem Relation Age of Onset  . Diabetes Mellitus II Father    Social History:  reports that he has quit smoking. He does not have any smokeless tobacco history on file. He reports that he does not drink alcohol or use illicit drugs.  Allergies:  Allergies  Allergen Reactions  . Penicillins Hives and Swelling    Medications Prior to Admission  Medication Sig Dispense Refill  . aspirin 325 MG tablet Take 325 mg by mouth daily.      . cinacalcet (SENSIPAR) 60 MG tablet Take 60 mg by mouth daily.       . folic acid-vitamin b complex-vitamin c-selenium-zinc (DIALYVITE) 3 MG TABS Take 1  tablet by mouth daily.      . insulin aspart (NOVOLOG) 100 UNIT/ML injection Inject 20-40 Units into the skin 3 (three) times daily before meals. Sliding scale      . insulin glargine (LANTUS) 100 UNIT/ML injection Inject 68 Units into the skin at bedtime.       Marland Kitchen omeprazole (PRILOSEC) 20 MG capsule Take 20 mg by mouth 2 (two) times daily.       Marland Kitchen oxycodone (OXY-IR) 5 MG capsule Take 10 mg by mouth every 6 (six) hours as needed. For pain      . pravastatin (PRAVACHOL) 40 MG tablet Take 40 mg by mouth Nightly.      Marland Kitchen rOPINIRole (REQUIP) 0.25 MG tablet Take 0.25-5 mg by mouth 3 (three) times daily. 1 tablet twice a day and 2 tablets at bedtime      . sennosides-docusate sodium (SENOKOT-S) 8.6-50 MG tablet Take 2 tablets by mouth 2 (two) times daily as needed. For constipation        No results found for this or any previous visit (from the past 48 hour(s)). No results found.  Review of Systems  Constitutional: Negative.        Low blood pressure.  HENT: Negative.   Eyes: Negative.   Respiratory: Negative.   Cardiovascular: Negative.   Gastrointestinal: Negative.   Genitourinary: Negative.   Musculoskeletal: Negative.   Skin: Negative.   Neurological: Negative.  Endo/Heme/Allergies: Negative.   Psychiatric/Behavioral: Negative.     Blood pressure 127/53, temperature 97.8 F (36.6 C), temperature source Oral, resp. rate 10, height 5' 11.5" (1.816 m), weight 101.3 kg (223 lb 5.2 oz), SpO2 92.00%. Physical Exam  Constitutional: He is oriented to person, place, and time. He appears well-developed and well-nourished. No distress.  HENT:  Head: Normocephalic and atraumatic.  Right Ear: External ear normal.  Left Ear: External ear normal.  Nose: Nose normal.  Mouth/Throat: Oropharynx is clear and moist. No oropharyngeal exudate.  Eyes:       Has poor vision.  Neck: Normal range of motion. Neck supple.  Cardiovascular: Normal rate and regular rhythm.   Respiratory: Effort normal and  breath sounds normal. No respiratory distress. He has no wheezes. He has no rales.  GI: Soft. Bowel sounds are normal. He exhibits no distension. There is no tenderness. There is no rebound.  Musculoskeletal: He exhibits no edema and no tenderness.  Neurological: He is alert and oriented to person, place, and time.       Moves all extremities.  Skin: Skin is warm and dry. He is not diaphoretic.     Assessment/Plan #1. Hypotension - probably secondary to dehydration during dialysis today. Presently patient pressure is around 110 systolic. Continue to monitor. Patient does not look septic. #2. Pneumonia - given patient's chest x-ray finding and leukocytosis patient will be treated as health care associated pneumonia. Patient will be placed on vancomycin cefepime and Levaquin. #3. ESRD on hemodialysis on Tuesday Thursdays and Saturdays - presently patient is not short of breath and not in distress. Consult nephrology in a.m. for dialysis. #4. Diabetes mellitus type 2 - continue home medications and sliding scale coverage and close followup of CBG. #5. History of CAD and cardiomyopathy last EF was 45% - presently chest pain-free and denies any shortness of breath. Continue aspirin. #6. Anemia probably from chronic kidney disease - continue to monitor CBC. #7. History of chronic lower extremity pain on pain medications probably from neuropathy.  Patient's labs including complete metabolic panel CBC EKG and troponins are pending. Labs done at the time of possible showed a WBC count of 17,000, hemoglobin of 10.5 platelets of 236. Chest x-ray was showing decreased aeration to the left base which may represent atelectasis and/or infiltrate. INR 1.1. Complete metabolic panel showing sodium 161 chloride 100 potassium 4.7 carbon dioxide 28 BUN 39 creatinine 7 albumin 4.1 calcium 8.1 troponin 0.02 AST 26 ALT 24 total bilirubin 0.6 CK MB 2.4.  CODE STATUS - full code.  [name] 10/30/2011, 8:51  PM

## 2011-10-30 NOTE — Progress Notes (Signed)
ANTIBIOTIC CONSULT NOTE - INITIAL  Pharmacy Consult for aztreonam Indication: pneumonia  Allergies  Allergen Reactions  . Penicillins Hives and Swelling    Patient Measurements: Height: 5' 11.5" (181.6 cm) Weight: 223 lb 5.2 oz (101.3 kg) IBW/kg (Calculated) : 76.45  Adjusted Body Weight:   Vital Signs: Temp: 97.8 F (36.6 C) (10/29 1930) Temp src: Oral (10/29 1930) BP: 112/55 mmHg (10/29 2006) Pulse Rate: 79  (10/29 2006) Intake/Output from previous day:   Intake/Output from this shift:    Labs:  [facility] 10/30/11 2129  WBC 15.8*  HGB 10.4*  PLT 203  LABCREA --  CREATININE 7.82*   Estimated Creatinine Clearance: 11.5 ml/min (by C-G formula based on Cr of 7.82). No results found for this basename: VANCOTROUGH:2,VANCOPEAK:2,VANCORANDOM:2,GENTTROUGH:2,GENTPEAK:2,GENTRANDOM:2,TOBRATROUGH:2,TOBRAPEAK:2,TOBRARND:2,AMIKACINPEAK:2,AMIKACINTROU:2,AMIKACIN:2, in the last 72 hours   Microbiology: Recent Results (from the past 720 hour(s))  MRSA PCR SCREENING     Status: Normal   Collection Time   10/30/11  7:47 PM      Component Value Range Status Comment   MRSA by PCR NEGATIVE  NEGATIVE Final     Medical History: Past Medical History  Diagnosis Date  . Renal insufficiency   . CHF (congestive heart failure)   . Diabetes mellitus   . Blind t    Medications:  Scheduled:    . aspirin  325 mg Oral Daily  . cinacalcet  60 mg Oral Daily  . heparin  5,000 Units Subcutaneous Q8H  . insulin aspart  0-20 Units Subcutaneous TID WC  . insulin glargine  68 Units Subcutaneous QHS  . levofloxacin (LEVAQUIN) IV  500 mg Intravenous Q48H  . levofloxacin (LEVAQUIN) IV  750 mg Intravenous Once  . multivitamin  1 tablet Oral Daily  . oxyCODONE  5 mg Oral Once  . pantoprazole  40 mg Oral Daily  . rOPINIRole  0.25 mg Oral BID  . rOPINIRole  0.5 mg Oral QHS  . simvastatin  20 mg Oral q1800  . sodium chloride  3 mL Intravenous Q12H  . sodium chloride  3 mL Intravenous Q12H  .  vancomycin  1,000 mg Intravenous Q T,Th,Sa-HD  . DISCONTD: ceFEPime (MAXIPIME) IV  2 g Intravenous Q T,Th,Sa-HD  . DISCONTD: folic acid-vitamin b complex-vitamin c-selenium-zinc  1 tablet Oral Daily  . DISCONTD: levofloxacin (LEVAQUIN) IV  750 mg Intravenous Once  . DISCONTD: vancomycin  2,000 mg Intravenous Once   Infusions:   Assessment: 66 yr old male with ESRD on HD was brought to the ER at [facility] with hypotension and confusion following his HD today. It was determined that he may have pneumonia and he was given 1.5 Gm of Vancomycin and 2 Gm of Cefepime, then transferred to [facility]. The pt has a penicillin allergy and he does claim to be itching since he got the Cefepime Glenys Snader MD wants to switch cefepime to aztreonam.  Plan:  1) Aztreonam 1g iv x1, then 1g iv q24h (give post HD on HD days)   Aldine Chakraborty, Tsz-Yin 10/30/2011,10:43 PM

## 2011-10-31 DIAGNOSIS — D649 Anemia, unspecified: Secondary | ICD-10-CM

## 2011-10-31 LAB — CBC
MCH: 30.5 pg (ref 26.0–34.0)
MCHC: 32.5 g/dL (ref 30.0–36.0)
Platelets: 188 10*3/uL (ref 150–400)
RDW: 12.8 % (ref 11.5–15.5)

## 2011-10-31 LAB — BASIC METABOLIC PANEL
BUN: 53 mg/dL — ABNORMAL HIGH (ref 6–23)
Calcium: 8.6 mg/dL (ref 8.4–10.5)
Creatinine, Ser: 8.16 mg/dL — ABNORMAL HIGH (ref 0.50–1.35)
GFR calc Af Amer: 7 mL/min — ABNORMAL LOW (ref >90)
GFR calc non Af Amer: 6 mL/min — ABNORMAL LOW (ref >90)
Glucose, Bld: 174 mg/dL — ABNORMAL HIGH (ref 70–99)

## 2011-10-31 LAB — GLUCOSE, CAPILLARY
Glucose-Capillary: 267 mg/dL — ABNORMAL HIGH (ref 70–99)
Glucose-Capillary: 199 mg/dL — ABNORMAL HIGH (ref 70–99)

## 2011-10-31 MED ORDER — DEXTROSE 5 % IV SOLN
500.0000 mg | Freq: Two times a day (BID) | INTRAVENOUS | Status: DC
  Administered 2011-10-31 – 2011-11-01 (×4): 500 mg via INTRAVENOUS
  Filled 2011-10-31 (×7): qty 0.5

## 2011-10-31 MED ORDER — NEPRO/CARBSTEADY PO LIQD: 237.0000 mL | ORAL | Status: DC | PRN

## 2011-10-31 MED ORDER — HEPARIN SODIUM (PORCINE) 1000 UNIT/ML DIALYSIS
9000.0000 [IU] | Freq: Once | INTRAMUSCULAR | Status: DC
Start: 2011-10-31 — End: 2011-11-02

## 2011-10-31 MED ORDER — PENTAFLUOROPROP-TETRAFLUOROETH EX AERO: 1.0000 "application " | INHALATION_SPRAY | CUTANEOUS | Status: DC | PRN

## 2011-10-31 MED ORDER — HEPARIN SODIUM (PORCINE) 1000 UNIT/ML DIALYSIS: 1000.0000 [IU] | INTRAMUSCULAR | Status: DC | PRN

## 2011-10-31 MED ORDER — LIDOCAINE HCL (PF) 1 % IJ SOLN: 5.0000 mL | INTRAMUSCULAR | Status: DC | PRN

## 2011-10-31 MED ORDER — LIDOCAINE-PRILOCAINE 2.5-2.5 % EX CREA: 1.0000 "application " | TOPICAL_CREAM | CUTANEOUS | Status: DC | PRN

## 2011-10-31 MED ORDER — SODIUM CHLORIDE 0.9 % IV SOLN: 100.0000 mL | INTRAVENOUS | Status: DC | PRN

## 2011-10-31 MED ORDER — ALTEPLASE 2 MG IJ SOLR
2.0000 mg | Freq: Once | INTRAMUSCULAR | Status: AC | PRN
  Filled 2011-10-31: qty 2

## 2011-10-31 MED ORDER — HEPARIN SODIUM (PORCINE) 1000 UNIT/ML DIALYSIS
1000.0000 [IU] | INTRAMUSCULAR | Status: DC | PRN
  Administered 2011-11-01: 1000 [IU] via INTRAVENOUS_CENTRAL

## 2011-10-31 NOTE — Progress Notes (Signed)
Utilization review completed.  

## 2011-10-31 NOTE — Progress Notes (Signed)
Patient being transferred to 6700 per MD order. Report called to [facility], RN on 6700.

## 2011-10-31 NOTE — Consult Note (Signed)
Reason for Consult:hypotension, ESRD management, anemia management Referring Physician: Sherlock Callery is an 66 y.o. male.  HPI: Pt is a 66yo WM with PMH sig for DM, HTN, ESRD, and hypertensive cardiomyopathy who was admitted on 10/30/11 after he developed refractory hypotension after HD.  He was 7kg above his EDW but only removed 4kg due to hypotension.  He was sent to [facility] ER and subsequently transferred to [facility] SDU.  We were asked to see the patient to help manage his ESRD and volume status in a patient with large fluid gains and stiff ventricles.    Dialyzes at [facility] on TTS. Primary Nephrologist Patel. EDW 97kg. HD Bath 2K/2.5Ca, Dialyzer 180NRe, Heparin 9000. Access [facility].  Past Medical History  Diagnosis Date  . Renal insufficiency   . CHF (congestive heart failure)   . Diabetes mellitus   . Blind t    Past Surgical History  Procedure Date  . Appendectomy     Family History  Problem Relation Age of Onset  . Diabetes Mellitus II Father     Social History:  reports that he has quit smoking. He does not have any smokeless tobacco history on file. He reports that he does not drink alcohol or use illicit drugs.  Allergies:  Allergies  Allergen Reactions  . Cefepime Itching  . Penicillins Hives and Swelling    Medications:  I have reviewed the patient's current medications. Prior to Admission:  Prescriptions prior to admission  Medication Sig Dispense Refill  . aspirin 325 MG tablet Take 325 mg by mouth daily.      . cinacalcet (SENSIPAR) 60 MG tablet Take 60 mg by mouth daily.       . folic acid-vitamin b complex-vitamin c-selenium-zinc (DIALYVITE) 3 MG TABS Take 1 tablet by mouth daily.      . insulin aspart (NOVOLOG) 100 UNIT/ML injection Inject 20-40 Units into the skin 3 (three) times daily before meals. Sliding scale      . insulin glargine (LANTUS) 100 UNIT/ML injection Inject 68 Units into the skin at bedtime.       Marland Kitchen omeprazole (PRILOSEC) 20 MG capsule  Take 20 mg by mouth 2 (two) times daily.       Marland Kitchen oxycodone (OXY-IR) 5 MG capsule Take 10 mg by mouth every 6 (six) hours as needed. For pain      . pravastatin (PRAVACHOL) 40 MG tablet Take 40 mg by mouth Nightly.      Marland Kitchen rOPINIRole (REQUIP) 0.25 MG tablet Take 0.25-5 mg by mouth 3 (three) times daily. 1 tablet twice a day and 2 tablets at bedtime      . sennosides-docusate sodium (SENOKOT-S) 8.6-50 MG tablet Take 2 tablets by mouth 2 (two) times daily as needed. For constipation       Zemplar IVP tiw Epogen 2600 units  Results for orders placed during the hospital encounter of 10/30/11 (from the past 48 hour(s))  GLUCOSE, CAPILLARY     Status: Abnormal   Collection Time   10/30/11  7:29 PM      Component Value Range Comment   Glucose-Capillary 267 (*) 70 - 99 mg/dL    Comment 1 Notify RN     MRSA PCR SCREENING     Status: Normal   Collection Time   10/30/11  7:47 PM      Component Value Range Comment   MRSA by PCR NEGATIVE  NEGATIVE   TROPONIN I     Status: Normal  Collection Time   10/30/11  9:09 PM      Component Value Range Comment   Troponin I <0.30  <0.30 ng/mL   COMPREHENSIVE METABOLIC PANEL     Status: Abnormal   Collection Time   10/30/11  9:29 PM      Component Value Range Comment   Sodium 138  135 - 145 mEq/L    Potassium 4.3  3.5 - 5.1 mEq/L    Chloride 96  96 - 112 mEq/L    CO2 27  19 - 32 mEq/L    Glucose, Bld 189 (*) 70 - 99 mg/dL    BUN 46 (*) 6 - 23 mg/dL    Creatinine, Ser 4.09 (*) 0.50 - 1.35 mg/dL    Calcium 9.0  8.4 - 81.1 mg/dL    Total Protein 6.8  6.0 - 8.3 g/dL    Albumin 3.2 (*) 3.5 - 5.2 g/dL    AST 15  0 - 37 U/L    ALT 13  0 - 53 U/L    Alkaline Phosphatase 76  39 - 117 U/L    Total Bilirubin 0.2 (*) 0.3 - 1.2 mg/dL    GFR calc non Af Amer 6 (*) >90 mL/min    GFR calc Af Amer 7 (*) >90 mL/min   CBC WITH DIFFERENTIAL     Status: Abnormal   Collection Time   10/30/11  9:29 PM      Component Value Range Comment   WBC 15.8 (*) 4.0 - 10.5  K/uL    RBC 3.36 (*) 4.22 - 5.81 MIL/uL    Hemoglobin 10.4 (*) 13.0 - 17.0 g/dL    HCT 91.4 (*) 78.2 - 52.0 %    MCV 93.2  78.0 - 100.0 fL    MCH 31.0  26.0 - 34.0 pg    MCHC 33.2  30.0 - 36.0 g/dL    RDW 95.6  21.3 - 08.6 %    Platelets 203  150 - 400 K/uL    Neutrophils Relative 75  43 - 77 %    Neutro Abs 11.8 (*) 1.7 - 7.7 K/uL    Lymphocytes Relative 17  12 - 46 %    Lymphs Abs 2.7  0.7 - 4.0 K/uL    Monocytes Relative 7  3 - 12 %    Monocytes Absolute 1.1 (*) 0.1 - 1.0 K/uL    Eosinophils Relative 1  0 - 5 %    Eosinophils Absolute 0.1  0.0 - 0.7 K/uL    Basophils Relative 0  0 - 1 %    Basophils Absolute 0.1  0.0 - 0.1 K/uL   GLUCOSE, CAPILLARY     Status: Abnormal   Collection Time   10/30/11  9:33 PM      Component Value Range Comment   Glucose-Capillary 199 (*) 70 - 99 mg/dL    Comment 1 Notify RN      Comment 2 Documented in Chart     BASIC METABOLIC PANEL     Status: Abnormal   Collection Time   10/31/11  4:40 AM      Component Value Range Comment   Sodium 134 (*) 135 - 145 mEq/L    Potassium 4.5  3.5 - 5.1 mEq/L    Chloride 95 (*) 96 - 112 mEq/L    CO2 25  19 - 32 mEq/L    Glucose, Bld 174 (*) 70 - 99 mg/dL    BUN 53 (*) 6 - 23 mg/dL  Creatinine, Ser 8.16 (*) 0.50 - 1.35 mg/dL    Calcium 8.6  8.4 - 29.5 mg/dL    GFR calc non Af Amer 6 (*) >90 mL/min    GFR calc Af Amer 7 (*) >90 mL/min   CBC     Status: Abnormal   Collection Time   10/31/11  4:40 AM      Component Value Range Comment   WBC 12.9 (*) 4.0 - 10.5 K/uL    RBC 3.15 (*) 4.22 - 5.81 MIL/uL    Hemoglobin 9.6 (*) 13.0 - 17.0 g/dL    HCT 28.4 (*) 13.2 - 52.0 %    MCV 93.7  78.0 - 100.0 fL    MCH 30.5  26.0 - 34.0 pg    MCHC 32.5  30.0 - 36.0 g/dL    RDW 44.0  10.2 - 72.5 %    Platelets 188  150 - 400 K/uL   GLUCOSE, CAPILLARY     Status: Abnormal   Collection Time   10/31/11  8:05 AM      Component Value Range Comment   Glucose-Capillary 109 (*) 70 - 99 mg/dL   GLUCOSE, CAPILLARY      Status: Abnormal   Collection Time   10/31/11 11:26 AM      Component Value Range Comment   Glucose-Capillary 158 (*) 70 - 99 mg/dL     No results found.  ROS: Pt denies any complaints at all and does not remember anything after HD yesterday.  Denies F/C/N/V/CP/SOB/diarrhea/hematechezia/melena/or BRPR Blood pressure 99/51, pulse 67, temperature 97.6 F (36.4 C), temperature source Oral, resp. rate 13, height 5' 11.5" (1.816 m), weight 102 kg (224 lb 13.9 oz), SpO2 100.00%. General appearance: alert, cooperative and no distress Neck: no adenopathy, no carotid bruit, supple, symmetrical, trachea midline and thyroid not enlarged, symmetric, no tenderness/mass/nodules Resp: diminished breath sounds bilaterally Cardio: no rub and faint HS GI: distended/obese, +BS, NT Extremities: extremities normal, atraumatic, no cyanosis or edema  Assessment/Plan: 1 Hypotension- after HD and was still 2kg above EDW.  Pt had 2D ECHO which showed concentric LVH.  I discussed with Mr. Doughten and his family, his large fluid gains between HD makes it very difficult to remove fluid with a stiff heart and that he needs to work to improve his fluid restriction.  I also discussed the need to increase his length of dialysis to help facilitate fluid removal during HD 2 ESRD: cont with TTS HD and will increase EDW to 99kg but increase time to at least 4 hours qtx.  Will also cool dialysate to 36 and use UF profile #4. 3 Hypertension: as above 4. Anemia of ESRD: cont with EPO 5. 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

## 2011-10-31 NOTE — Progress Notes (Signed)
[facility] PROGRESS NOTE  [name] ZOX:[id number] DOB: 10-May-1945 DOA: 10/30/2011 PCP: Lizbeth Bark, FNP  Assessment/Plan: #1. Hypotension - probably secondary to dehydration during dialysis today. Presently patient pressure is around 110 systolic. Continue to monitor. Patient does not look septic.   #2. Pneumonia - given patient's chest x-ray finding and leukocytosis patient will be treated as health care associated pneumonia. Patient will be placed on vancomycin azactam (as allergy to pcn) and Levaquin.   #3. ESRD on hemodialysis on Tuesday Thursdays and Saturdays - presently patient is not short of breath and not in distress. nephrology in a.m. for dialysis.   #4. Diabetes mellitus type 2 - continue home medications and sliding scale coverage and close followup of CBG.   #5. History of CAD and cardiomyopathy last EF was 45% - presently chest pain-free and denies any shortness of breath. Continue aspirin.   #6. Anemia probably from chronic kidney disease - continue to monitor CBC.   #7. History of chronic lower extremity pain on pain medications probably from neuropathy.  #8. Leukocytosis- decreasing   Code Status: full Family Communication: patient at bedside Disposition Plan: home when better   Consultants:  nephro  Procedures:    Antibiotics:  vanc 10/29  azactam 10/29  Levaquin: 10/29  HPI/Subjective: Feeling fine, off O2 Breathing ok No SOB, eating well  Objective: Filed Vitals:   10/31/11 0400 10/31/11 0419 10/31/11 0500 10/31/11 0800  BP:  104/54  112/58  Pulse: 73 94    Temp:  98.2 F (36.8 C)  97.8 F (36.6 C)  TempSrc:  Oral  Oral  Resp:  15    Height:      Weight:   102 kg (224 lb 13.9 oz)   SpO2:  96%      Intake/Output Summary (Last 24 hours) at 10/31/11 0951 Last data filed at 10/31/11 0800  Gross per 24 hour  Intake    240 ml  Output      0 ml  Net    240 ml   Filed Weights   10/30/11 1925 10/30/11 1930 10/31/11  0500  Weight: 101.3 kg (223 lb 5.2 oz) 101.3 kg (223 lb 5.2 oz) 102 kg (224 lb 13.9 oz)    Exam:   General:  A+Ox3, NAD  Cardiovascular: rrr  Respiratory: dec BS b/l, no wheezing  Abdomen: +BS, soft, obese  -c/c/e  Data Reviewed: Basic Metabolic Panel:  Lab 10/31/11 8119 10/30/11 2129  NA 134* 138  K 4.5 4.3  CL 95* 96  CO2 25 27  GLUCOSE 174* 189*  BUN 53* 46*  CREATININE 8.16* 7.82*  CALCIUM 8.6 9.0  MG -- --  PHOS -- --   Liver Function Tests:  Lab 10/30/11 2129  AST 15  ALT 13  ALKPHOS 76  BILITOT 0.2*  PROT 6.8  ALBUMIN 3.2*   No results found for this basename: LIPASE:5,AMYLASE:5 in the last 168 hours No results found for this basename: AMMONIA:5 in the last 168 hours CBC:  Lab 10/31/11 0440 10/30/11 2129  WBC 12.9* 15.8*  NEUTROABS -- 11.8*  HGB 9.6* 10.4*  HCT 29.5* 31.3*  MCV 93.7 93.2  PLT 188 203   Cardiac Enzymes:  Lab 10/30/11 2109  CKTOTAL --  CKMB --  CKMBINDEX --  TROPONINI <0.30   BNP (last 3 results) No results found for this basename: PROBNP:3 in the last 8760 hours CBG:  Lab 10/30/11 2133 10/30/11 1929  GLUCAP 199* 267*    Recent Results (from  the past 240 hour(s))  MRSA PCR SCREENING     Status: Normal   Collection Time   10/30/11  7:47 PM      Component Value Range Status Comment   MRSA by PCR NEGATIVE  NEGATIVE Final      Studies: No results found.  Scheduled Meds:   . aspirin  325 mg Oral Daily  . aztreonam  1 g Intravenous Once  . aztreonam  1 g Intravenous Q24H  . cinacalcet  60 mg Oral Daily  . heparin  5,000 Units Subcutaneous Q8H  . insulin aspart  0-20 Units Subcutaneous TID WC  . insulin glargine  68 Units Subcutaneous QHS  . levofloxacin (LEVAQUIN) IV  500 mg Intravenous Q48H  . levofloxacin (LEVAQUIN) IV  750 mg Intravenous Once  . multivitamin  1 tablet Oral Daily  . oxyCODONE  5 mg Oral Once  . pantoprazole  40 mg Oral Daily  . rOPINIRole  0.25 mg Oral BID  . rOPINIRole  0.5 mg Oral QHS    . simvastatin  20 mg Oral q1800  . sodium chloride  3 mL Intravenous Q12H  . sodium chloride  3 mL Intravenous Q12H  . vancomycin  1,000 mg Intravenous Q T,Th,Sa-HD  . DISCONTD: ceFEPime (MAXIPIME) IV  2 g Intravenous Q T,Th,Sa-HD  . DISCONTD: folic acid-vitamin b complex-vitamin c-selenium-zinc  1 tablet Oral Daily  . DISCONTD: levofloxacin (LEVAQUIN) IV  750 mg Intravenous Once  . DISCONTD: vancomycin  2,000 mg Intravenous Once   Continuous Infusions:   Principal Problem:  *Hypotension Active Problems:  Pneumonia  ESRD (end stage renal disease) on dialysis  DM2 (diabetes mellitus, type 2)  Cardiomyopathy  Anemia    Time spent: 57    Marlin Canary  [facility] Pager [phone] 10/31/2011, 9:51 AM  LOS: 1 day

## 2011-11-01 ENCOUNTER — Inpatient Hospital Stay (HOSPITAL_COMMUNITY): Payer: Medicare Other

## 2011-11-01 LAB — CBC
HCT: 28.4 % — ABNORMAL LOW (ref 39.0–52.0)
MCHC: 32.7 g/dL (ref 30.0–36.0)
MCV: 91.6 fL (ref 78.0–100.0)
RDW: 12.4 % (ref 11.5–15.5)

## 2011-11-01 LAB — RENAL FUNCTION PANEL
Albumin: 2.8 g/dL — ABNORMAL LOW (ref 3.5–5.2)
BUN: 69 mg/dL — ABNORMAL HIGH (ref 6–23)
CO2: 24 mEq/L (ref 19–32)
Chloride: 89 mEq/L — ABNORMAL LOW (ref 96–112)
Creatinine, Ser: 10.16 mg/dL — ABNORMAL HIGH (ref 0.50–1.35)
GFR calc non Af Amer: 5 mL/min — ABNORMAL LOW (ref >90)
Potassium: 5.4 mEq/L — ABNORMAL HIGH (ref 3.5–5.1)

## 2011-11-01 LAB — GLUCOSE, CAPILLARY
Glucose-Capillary: 66 mg/dL — ABNORMAL LOW (ref 70–99)
Glucose-Capillary: 101 mg/dL — ABNORMAL HIGH (ref 70–99)
Glucose-Capillary: 160 mg/dL — ABNORMAL HIGH (ref 70–99)
Glucose-Capillary: 164 mg/dL — ABNORMAL HIGH (ref 70–99)

## 2011-11-01 MED ORDER — DIPHENHYDRAMINE HCL 25 MG PO CAPS: 12.5000 mg | ORAL_CAPSULE | Freq: Once | ORAL | Status: DC

## 2011-11-01 MED ORDER — OXYCODONE HCL 5 MG PO TABS
ORAL_TABLET | ORAL | Status: AC
  Administered 2011-11-01: 10 mg via ORAL
  Filled 2011-11-01: qty 2

## 2011-11-01 MED ORDER — SODIUM CHLORIDE 0.9 % IV BOLUS (SEPSIS)
250.0000 mL | Freq: Once | INTRAVENOUS | Status: AC
  Administered 2011-11-01: 250 mL via INTRAVENOUS

## 2011-11-01 MED ORDER — RENA-VITE PO TABS
1.0000 | ORAL_TABLET | Freq: Every day | ORAL | Status: DC
  Administered 2011-11-01 – 2011-11-03 (×3): 1 via ORAL
  Filled 2011-11-01 (×4): qty 1

## 2011-11-01 MED ORDER — DARBEPOETIN ALFA-POLYSORBATE 40 MCG/0.4ML IJ SOLN
40.0000 ug | INTRAMUSCULAR | Status: DC
  Administered 2011-11-01: 40 ug via INTRAVENOUS
  Filled 2011-11-01: qty 0.4

## 2011-11-01 MED ORDER — DIPHENHYDRAMINE HCL 12.5 MG/5ML PO ELIX
12.5000 mg | ORAL_SOLUTION | Freq: Once | ORAL | Status: AC
  Administered 2011-11-01: 17:00:00 via ORAL
  Filled 2011-11-01 (×2): qty 5

## 2011-11-01 MED ORDER — DARBEPOETIN ALFA-POLYSORBATE 40 MCG/0.4ML IJ SOLN
INTRAMUSCULAR | Status: AC
  Filled 2011-11-01: qty 0.4

## 2011-11-01 MED ORDER — CALCIUM ACETATE 667 MG PO CAPS
2866.0000 mg | ORAL_CAPSULE | Freq: Three times a day (TID) | ORAL | Status: DC
  Administered 2011-11-01 – 2011-11-04 (×9): 2668 mg via ORAL
  Filled 2011-11-01 (×12): qty 4

## 2011-11-01 NOTE — Progress Notes (Signed)
Patient seen and examined by me.  Possible transfer to [facility] if patient agreeable, for continued treatment of hypotension and pneumonia.  Appreciate nephrology's help.  Marlin Canary DO

## 2011-11-01 NOTE — Progress Notes (Signed)
I have seen and examined this patient and agree with plan as outlined by Audree Bane, PA-C. Patient was seen on dialysis and the procedure was supervised. BFR 400 Via PC BP is 98/60.  Patient appears to be tolerating treatment well.   Keylin Podolsky A,MD 11/01/2011 9:51 AM

## 2011-11-01 NOTE — Progress Notes (Signed)
[facility] PROGRESS NOTE  MCCRAE CHANN WUJ:[id number] DOB: 04-Nov-1945 DOA: 10/30/2011 PCP: Lizbeth Bark, FNP  Assessment/Plan: 1. Hypotension - probably secondary to dehydration during dialysis today. SBP ranger 112-157 during night. Currently in dialysis.  Continue to monitor. Patient does not look septic.  #2. Pneumonia - given patient's chest x-ray finding and leukocytosis patient will be treated as health care associated pneumonia. WC trending downward. Continue vancomycin azactam and Levaquin day #3. Non-toxic appearing. #3. ESRD on hemodialysis on Tuesday Thursdays and Saturdays - appreciate nephrology assistance. Currently in dialysis.   #4. Diabetes mellitus type 2 - continue home medications and sliding scale coverage and close followup of CBG. Range 113,117. #5. History of CAD and cardiomyopathy last EF was 45% - presently chest pain-free and denies any shortness of breath. Continue aspirin.  #6. Anemia probably from chronic kidney disease - trending down slightly. Currently 9.3. Chart review indicates baseline 10-11.  continue to monitor CBC.  #7. History of chronic lower extremity pain on pain medications probably from neuropathy.     Code Status: full Family Communication:  Disposition Plan: home when medically stable   Consultants:  nephro  Procedures:  none  Antibiotics: vanc 10/29  azactam 10/29  Levaquin: 10/29   HPI/Subjective: In dialysis watching TV. Reports "not feeling so well" denies pain/discomfort/nausea.  Objective: Filed Vitals:   11/01/11 0539 11/01/11 0720 11/01/11 0736 11/01/11 0800  BP: 128/64 157/45 112/57 87/52  Pulse: 80  80 78  Temp: 98 F (36.7 C) 97.1 F (36.2 C)    TempSrc: Oral Oral    Resp: 18 17    Height:      Weight:  104.6 kg (230 lb 9.6 oz)    SpO2: 100% 99%      Intake/Output Summary (Last 24 hours) at 11/01/11 0819 Last data filed at 11/01/11 0600  Gross per 24 hour  Intake    463 ml  Output      0 ml    Net    463 ml   Filed Weights   10/31/11 0500 10/31/11 2123 11/01/11 0720  Weight: 102 kg (224 lb 13.9 oz) 104.4 kg (230 lb 2.6 oz) 104.6 kg (230 lb 9.6 oz)    Exam:   General:  Alert,oriented x3  Cardiovascular: RRR No MGR   Respiratory: normal effort. BS somewhat coarse . No wheeze  Abdomen: obese, soft +BS non-tender to palp  Data Reviewed: Basic Metabolic Panel:  Lab 10/31/11 9562 10/30/11 2129  NA 134* 138  K 4.5 4.3  CL 95* 96  CO2 25 27  GLUCOSE 174* 189*  BUN 53* 46*  CREATININE 8.16* 7.82*  CALCIUM 8.6 9.0  MG -- --  PHOS -- --   Liver Function Tests:  Lab 10/30/11 2129  AST 15  ALT 13  ALKPHOS 76  BILITOT 0.2*  PROT 6.8  ALBUMIN 3.2*   No results found for this basename: LIPASE:5,AMYLASE:5 in the last 168 hours No results found for this basename: AMMONIA:5 in the last 168 hours CBC:  Lab 11/01/11 0740 10/31/11 0440 10/30/11 2129  WBC 11.8* 12.9* 15.8*  NEUTROABS -- -- 11.8*  HGB 9.3* 9.6* 10.4*  HCT 28.4* 29.5* 31.3*  MCV 91.6 93.7 93.2  PLT 227 188 203   Cardiac Enzymes:  Lab 10/30/11 2109  CKTOTAL --  CKMB --  CKMBINDEX --  TROPONINI <0.30   BNP (last 3 results) No results found for this basename: PROBNP:3 in the last 8760 hours CBG:  Lab 10/31/11 2126 10/31/11  1637 10/31/11 1126 10/31/11 0805 10/30/11 2133  GLUCAP 113* 117* 158* 109* 199*    Recent Results (from the past 240 hour(s))  MRSA PCR SCREENING     Status: Normal   Collection Time   10/30/11  7:47 PM      Component Value Range Status Comment   MRSA by PCR NEGATIVE  NEGATIVE Final      Studies: No results found.  Scheduled Meds:   . aspirin  325 mg Oral Daily  . aztreonam  500 mg Intravenous Q12H  . cinacalcet  60 mg Oral Daily  . heparin  5,000 Units Subcutaneous Q8H  . heparin  9,000 Units Dialysis Once in dialysis  . insulin aspart  0-20 Units Subcutaneous TID WC  . insulin glargine  68 Units Subcutaneous QHS  . levofloxacin (LEVAQUIN) IV  500 mg  Intravenous Q48H  . multivitamin  1 tablet Oral Daily  . pantoprazole  40 mg Oral Daily  . rOPINIRole  0.25 mg Oral BID  . rOPINIRole  0.5 mg Oral QHS  . simvastatin  20 mg Oral q1800  . sodium chloride  3 mL Intravenous Q12H  . sodium chloride  3 mL Intravenous Q12H  . vancomycin  1,000 mg Intravenous Q T,Th,Sa-HD  . DISCONTD: aztreonam  1 g Intravenous Q24H   Continuous Infusions:   Principal Problem:  *Hypotension Active Problems:  Pneumonia  ESRD (end stage renal disease) on dialysis  DM2 (diabetes mellitus, type 2)  Cardiomyopathy  Anemia    Time spent: 30 minutes    Gwenyth Bender NP [facility] . If 8PM-8AM, please contact night-coverage at [email or web address], password [facility] 11/01/2011, 8:19 AM  LOS: 2 days

## 2011-11-01 NOTE — Progress Notes (Signed)
Pt complaining of nausea and itching. He was getting increasingly agitated. Found his O2 was disconnected from wall. Reattached O2 to 3L via [place]. Checked BS it was 160. And checked BP 82/38. MD notified and orders received.

## 2011-11-01 NOTE — Progress Notes (Signed)
[facility] Progress Note  Subjective:  Still feels bad. Some SOB. No cough or CP. Takes 4 "blue and white pills" Doesn't know if he takes sensipar  Objective Filed Vitals:   11/01/11 0720 11/01/11 0736 11/01/11 0800 11/01/11 0830  BP: 157/45 112/57 87/52 95/52   Pulse:  80 78 83  Temp: 97.1 F (36.2 C)     TempSrc: Oral     Resp: 17     Height:      Weight: 104.6 kg (230 lb 9.6 oz)     SpO2: 99%      Physical Exam goal 3.5 General: Obese. Supine on HD Heart: RRR Lungs: decreased BS with rhonchi right lower lung. Scattered soft wheezes Abdomen: obese soft NT Extremities: no LE edema Dialysis Access: left I-J  Dialyzes at [facility] on TTS. Primary Nephrologist Patel. EDW 97kg. Epo 2800 zemplar 2 HD Bath 2K/2.5Ca, Dialyzer 180NRe, Heparin 9000. Access LIJ PC.   Assessment/Plan: 1. Hypotension post HD 10/29 - EDW increased to 99, but still above. Goal decreased to 3.5 due to low BP.  May need extra treatment Friday.  Re-evaluate in the am. 2. Pneumonia - on levaquin and Vanc 2. ESRD - TTS - per routine; refuses perm access due to prior steal problems. 3. Anemia - Hgb 9.3 - dose aranesp 40 today. 4. Secondary hyperparathyroidism - P 10.2 - resume binders - obviously NOT taking PTA.; resume phoslo - 4 ac; getting sensipar here, although he doesn't know if he takes it and considering his P is 10 he likely does not take it or sensipar.  Holding zemplar due to high Ca and P product. - Last iPTH 1300s   6. Nutrition - change to renal diet with fluid restriction. 7. Medical noncompliance 8. DM - per primary  Sheffield Slider, PA-C [facility] [phone]  11/01/2011,9:04 AM  LOS: 2 days    Additional Objective Labs: Basic Metabolic Panel:  Lab 11/01/11 8469 10/31/11 0440 10/30/11 2129  NA 130* 134* 138  K 5.4* 4.5 4.3  CL 89* 95* 96  CO2 24 25 27   GLUCOSE 53* 174* 189*  BUN 69* 53* 46*  CREATININE 10.16* 8.16* 7.82*  CALCIUM 8.6 8.6 9.0    ALB -- -- --  PHOS 10.2* -- --   Liver Function Tests:  Lab 11/01/11 0739 10/30/11 2129  AST -- 15  ALT -- 13  ALKPHOS -- 76  BILITOT -- 0.2*  PROT -- 6.8  ALBUMIN 2.8* 3.2*   CBC:  Lab 11/01/11 0740 10/31/11 0440 10/30/11 2129  WBC 11.8* 12.9* 15.8*  NEUTROABS -- -- 11.8*  HGB 9.3* 9.6* 10.4*  HCT 28.4* 29.5* 31.3*  MCV 91.6 93.7 93.2  PLT 227 188 203  Cardiac Enzymes:  Lab 10/30/11 2109  CKTOTAL --  CKMB --  CKMBINDEX --  TROPONINI <0.30   CBG:  Lab 10/31/11 2126 10/31/11 1637 10/31/11 1126 10/31/11 0805 10/30/11 2133  GLUCAP 113* 117* 158* 109* 199*  Medications:      . aspirin  325 mg Oral Daily  . aztreonam  500 mg Intravenous Q12H  . cinacalcet  60 mg Oral Daily  . heparin  5,000 Units Subcutaneous Q8H  . heparin  9,000 Units Dialysis Once in dialysis  . insulin aspart  0-20 Units Subcutaneous TID WC  . insulin glargine  68 Units Subcutaneous QHS  . levofloxacin (LEVAQUIN) IV  500 mg Intravenous Q48H  . multivitamin  1 tablet Oral Daily  . pantoprazole  40 mg Oral  Daily  . rOPINIRole  0.25 mg Oral BID  . rOPINIRole  0.5 mg Oral QHS  . simvastatin  20 mg Oral q1800  . sodium chloride  3 mL Intravenous Q12H  . sodium chloride  3 mL Intravenous Q12H  . vancomycin  1,000 mg Intravenous Q T,Th,Sa-HD  . DISCONTD: aztreonam  1 g Intravenous Q24H

## 2011-11-01 NOTE — Progress Notes (Signed)
Hypoglycemic Event  CBG: 66  Treatment: 15 GM carbohydrate snack  Symptoms: Pale  Follow-up CBG: Time:1300 CBG Result:101  Possible Reasons for Event: Inadequate meal intake  Comments/MD notified:MD Vann Notified    Jaclynn Major  Remember to initiate Hypoglycemia Order Set & complete

## 2011-11-02 LAB — BASIC METABOLIC PANEL
Calcium: 9.7 mg/dL (ref 8.4–10.5)
GFR calc Af Amer: 8 mL/min — ABNORMAL LOW (ref >90)
GFR calc non Af Amer: 7 mL/min — ABNORMAL LOW (ref >90)
Potassium: 5.1 mEq/L (ref 3.5–5.1)
Sodium: 136 mEq/L (ref 135–145)

## 2011-11-02 LAB — CBC
MCH: 30.9 pg (ref 26.0–34.0)
MCHC: 32.5 g/dL (ref 30.0–36.0)
Platelets: 237 10*3/uL (ref 150–400)
RBC: 3.11 MIL/uL — ABNORMAL LOW (ref 4.22–5.81)

## 2011-11-02 LAB — GLUCOSE, CAPILLARY
Glucose-Capillary: 112 mg/dL — ABNORMAL HIGH (ref 70–99)
Glucose-Capillary: 132 mg/dL — ABNORMAL HIGH (ref 70–99)

## 2011-11-02 LAB — VANCOMYCIN, RANDOM: Vancomycin Rm: 15.1 ug/mL

## 2011-11-02 MED ORDER — DEXTROSE 5 % IV SOLN
1.0000 g | INTRAVENOUS | Status: DC
  Administered 2011-11-02: 1 g via INTRAVENOUS
  Filled 2011-11-02 (×3): qty 1

## 2011-11-02 MED ORDER — HEPARIN SODIUM (PORCINE) 1000 UNIT/ML DIALYSIS
100.0000 [IU]/kg | INTRAMUSCULAR | Status: DC | PRN
  Filled 2011-11-02: qty 11

## 2011-11-02 MED ORDER — INSULIN GLARGINE 100 UNIT/ML ~~LOC~~ SOLN
50.0000 [IU] | Freq: Every day | SUBCUTANEOUS | Status: DC
  Administered 2011-11-02: 50 [IU] via SUBCUTANEOUS

## 2011-11-02 MED ORDER — GLUCOSE-VITAMIN C 4-6 GM-MG PO CHEW
CHEWABLE_TABLET | ORAL | Status: AC
  Administered 2011-11-02: 3
  Filled 2011-11-02: qty 1

## 2011-11-02 MED ORDER — MIDODRINE HCL 5 MG PO TABS
5.0000 mg | ORAL_TABLET | ORAL | Status: DC
  Administered 2011-11-03: 5 mg via ORAL
  Filled 2011-11-02: qty 1

## 2011-11-02 MED ORDER — LEVOFLOXACIN 500 MG PO TABS
500.0000 mg | ORAL_TABLET | ORAL | Status: DC
  Administered 2011-11-03: 500 mg via ORAL
  Filled 2011-11-02: qty 1

## 2011-11-02 NOTE — Progress Notes (Signed)
I have seen and examined this patient and agree with plan as outlined by M. Doran Durand, PA-C.  Pt did well with HD yesterday.  Plan for HD again tomorrow to make sure he cont to do well prior to d/c. Marny Smethers A,MD 11/02/2011 3:02 PM

## 2011-11-02 NOTE — Progress Notes (Signed)
[facility] Progress Note  Subjective:   No new issues.  Objective Filed Vitals:   11/01/11 1807 11/01/11 2141 11/02/11 0623 11/02/11 1000  BP: 122/82 123/62 134/60 135/54  Pulse: 100 82 79 89  Temp: 97.4 F (36.3 C) 98.7 F (37.1 C) 97.2 F (36.2 C) 98.4 F (36.9 C)  TempSrc: Oral Oral Oral Oral  Resp: 18 18 18 18   Height:  5' 11.5" (1.816 m)    Weight:  102.8 kg (226 lb 10.1 oz)    SpO2: 92% 98% 100% 98%   Physical Exam General: NAD wearing O2 Heart: RRR Lungs: decreased bases; scattered soft wheeze Abdomen: obese soft Extremities: no LE edema Dialysis Access: left I-J;   Dialyzes at [facility] on TTS. Primary Nephrologist Patel. EDW 97kg.  HD Bath 2K/2.5Ca, Dialyzer 180NRe, Heparin 9000. Access LIJ PC.Epo 2600 zemplar 2 3.75 hr  Assessment/Plan:  1. Hypotension post HD 10/29 - EDW increased to 99, but still above. UF 2.3 Thursday with post wt of 102.8.  BP much higher today.  Med list at dialysis shows he takes midodrine 5mg   before HD- likely didn't take this either pre HD 2. Pneumonia - on levaquin and Vanc and aztreonam. WBC wnl 2. ESRD - TTS - per routine; refuses perm access due to prior steal problems; This has ben discussed again with pt this admission and pt refuses. Plan HD in am. Running 4 hours instead of usual 3.75 due to volume. 3. Anemia - Hgb 9.6 - stable - dosed aranesp 40 10/31.  4. Secondary hyperparathyroidism - P 10.2 - resume binders - obviously NOT taking PTA.; resume phoslo - 4 ac; getting sensipar here, although he doesn't know if he takes it and considering his P is 10 he likely does not take it or sensipar. Holding zemplar due to high Ca and P product. - Last iPTH 1300s  6. Nutrition - change to renal diet with fluid restriction.  7. Medical noncompliance 8. DM - per primary; hypoglycemic this am.- variability may be due to levaquin. Sheffield Slider, PA-C [facility] Beeper [phone]  11/02/2011,12:03 PM  LOS: 3 days     Additional Objective Labs: Basic Metabolic Panel:  Lab 11/02/11 6295 11/01/11 0739 10/31/11 0440  NA 136 130* 134*  K 5.1 5.4* 4.5  CL 96 89* 95*  CO2 28 24 25   GLUCOSE 62* 53* 174*  BUN 38* 69* 53*  CREATININE 7.05* 10.16* 8.16*  CALCIUM 9.7 8.6 8.6  ALB -- -- --  PHOS -- 10.2* --   Liver Function Tests:  Lab 11/01/11 0739 10/30/11 2129  AST -- 15  ALT -- 13  ALKPHOS -- 76  BILITOT -- 0.2*  PROT -- 6.8  ALBUMIN 2.8* 3.2*   CBC:  Lab 11/02/11 0542 11/01/11 0740 10/31/11 0440 10/30/11 2129  WBC 7.8 11.8* 12.9* --  NEUTROABS -- -- -- 11.8*  HGB 9.6* 9.3* 9.6* --  HCT 29.5* 28.4* 29.5* --  MCV 94.9 91.6 93.7 93.2  PLT 237 227 188 --  Cardiac Enzymes:  Lab 10/30/11 2109  CKTOTAL --  CKMB --  CKMBINDEX --  TROPONINI <0.30   CBG:  Lab 11/02/11 1155 11/02/11 0855 11/02/11 0800 11/01/11 1714 11/01/11 1410  GLUCAP 148* 135* 45* 164* 160*  Medications:      . aspirin  325 mg Oral Daily  . aztreonam  500 mg Intravenous Q12H  . calcium acetate  2,668 mg Oral TID WC  . cinacalcet  60 mg Oral Daily  .  darbepoetin (ARANESP) injection - DIALYSIS  40 mcg Intravenous Q Thu-HD  . diphenhydrAMINE  12.5 mg Oral Once  . glucose-Vitamin C      . heparin  5,000 Units Subcutaneous Q8H  . heparin  9,000 Units Dialysis Once in dialysis  . insulin aspart  0-20 Units Subcutaneous TID WC  . insulin glargine  50 Units Subcutaneous QHS  . levofloxacin (LEVAQUIN) IV  500 mg Intravenous Q48H  . multivitamin  1 tablet Oral QHS  . pantoprazole  40 mg Oral Daily  . rOPINIRole  0.25 mg Oral BID  . rOPINIRole  0.5 mg Oral QHS  . simvastatin  20 mg Oral q1800  . sodium chloride  250 mL Intravenous Once  . sodium chloride  3 mL Intravenous Q12H  . sodium chloride  3 mL Intravenous Q12H  . vancomycin  1,000 mg Intravenous Q T,Th,Sa-HD  . DISCONTD: diphenhydrAMINE  25 mg Oral Once  . DISCONTD: insulin glargine  68 Units Subcutaneous QHS

## 2011-11-02 NOTE — Significant Event (Signed)
CBG: 45  Treatment: 3 glucose tabs  Symptoms: Shaky  Follow-up CBG: Time:900 CBG Result:135  Possible Reasons for Event: Unknown  Comments/MD notified:vann

## 2011-11-02 NOTE — Progress Notes (Signed)
ANTIBIOTIC CONSULT NOTE - FOLLOW UP  Pharmacy Consult for Vancomycin + Levaquin + Aztreonam Indication: rule out pneumonia  Allergies  Allergen Reactions  . Cefepime Itching  . Penicillins Hives and Swelling    Patient Measurements: Height: 5' 11.5" (181.6 cm) Weight: 226 lb 10.1 oz (102.8 kg) IBW/kg (Calculated) : 76.45   Vital Signs: Temp: 98.4 F (36.9 C) (11/01 1000) Temp src: Oral (11/01 1000) BP: 135/54 mmHg (11/01 1000) Pulse Rate: 89  (11/01 1000) Intake/Output from previous day: 10/31 0701 - 11/01 0700 In: 1280 [P.O.:1080; IV Piggyback:200] Out: 2755 [Urine:400] Intake/Output from this shift: Total I/O In: 240 [P.O.:240] Out: -   Labs:  Basename 11/02/11 0542 11/01/11 0740 11/01/11 0739 10/31/11 0440  WBC 7.8 11.8* -- 12.9*  HGB 9.6* 9.3* -- 9.6*  PLT 237 227 -- 188  LABCREA -- -- -- --  CREATININE 7.05* -- 10.16* 8.16*   Estimated Creatinine Clearance: 12.9 ml/min (by C-G formula based on Cr of 7.05).  Basename 11/02/11 0542  VANCOTROUGH --  Leodis Binet --  VANCORANDOM 15.1  GENTTROUGH --  GENTPEAK --  GENTRANDOM --  TOBRATROUGH --  TOBRAPEAK --  TOBRARND --  AMIKACINPEAK --  AMIKACINTROU --  AMIKACIN --     Microbiology: Recent Results (from the past 720 hour(s))  MRSA PCR SCREENING     Status: Normal   Collection Time   10/30/11  7:47 PM      Component Value Range Status Comment   MRSA by PCR NEGATIVE  NEGATIVE Final     Anti-infectives     Start     Dose/Rate Route Frequency Ordered Stop   11/01/11 2200   levofloxacin (LEVAQUIN) IVPB 500 mg        500 mg 100 mL/hr over 60 Minutes Intravenous Every 48 hours 10/30/11 2222     11/01/11 1200   vancomycin (VANCOCIN) IVPB 1000 mg/200 mL premix        1,000 mg 200 mL/hr over 60 Minutes Intravenous Every T-Th-Sa (Hemodialysis) 10/30/11 2218     11/01/11 1200   ceFEPIme (MAXIPIME) 2 g in dextrose 5 % 50 mL IVPB  Status:  Discontinued        2 g 100 mL/hr over 30 Minutes Intravenous  Every T-Th-Sa (Hemodialysis) 10/30/11 2220 10/30/11 2235   10/31/11 2200   aztreonam (AZACTAM) 1 g in dextrose 5 % 50 mL IVPB  Status:  Discontinued        1 g 100 mL/hr over 30 Minutes Intravenous Every 24 hours 10/30/11 2245 10/31/11 1135   10/31/11 1200   aztreonam (AZACTAM) 500 mg in dextrose 5 % 50 mL IVPB        500 mg 100 mL/hr over 30 Minutes Intravenous Every 12 hours 10/31/11 1135     10/31/11 0000   aztreonam (AZACTAM) 1 g in dextrose 5 % 50 mL IVPB        1 g 100 mL/hr over 30 Minutes Intravenous  Once 10/30/11 2245 10/31/11 0020   10/30/11 2300   levofloxacin (LEVAQUIN) IVPB 750 mg  Status:  Discontinued        750 mg 100 mL/hr over 90 Minutes Intravenous  Once 10/30/11 2159 10/30/11 2209   10/30/11 2300   levofloxacin (LEVAQUIN) IVPB 750 mg        750 mg 100 mL/hr over 90 Minutes Intravenous  Once 10/30/11 2221 10/31/11 0208   10/30/11 2200   vancomycin (VANCOCIN) 2,000 mg in sodium chloride 0.9 % 500 mL IVPB  Status:  Discontinued  2,000 mg 250 mL/hr over 120 Minutes Intravenous  Once 10/30/11 2157 10/30/11 2209          Assessment: 66 y.o. M with ESRD on Vancomycin + Levaquin + Azactam for empiric HCAP coverage. Pt transferred to [facility] after receiving initial LD of Vancomycin at [facility]. Random Vancomycin level this morning shows the patient is within the therapeutic range (Vanc level~15.1, goal of 15-25 mcg/mL). There was discussion of an extra HD session today to help with fluid removal -- but per renal, will remain on home T/Th/Sat schedule, to receive HD next on Sat, 11/2   Based on the IV to po P&T protocol -- will go ahead and convert Levaquin from IV to po as the patient meets the following criteria:  Patient being treated for a respiratory tract infection, urinary tract infection, or cellulitis  The patient is not neutropenic and does not exhibit a GI malabsorption state  The patient is eating (either orally or via tube) and/or has been  taking other orally administered medications for a least 24 hours  The patient is improving clinically and has a Tmax < 100.5  Goal of Therapy:  Pre-HD Vancomycin level of 15-25 mcg/ml  Plan:  1. Continue Vancomycin 1g IV post HD sessions on T/Th/Sat 2. Adjust Azactam to 1g IV every 24 hours (after HD on HD days) 3. Will adjust Levaquin to 500 mg po every 48 hours (next dose due on 11/2) 4. Consider de-escalating antibiotics to Levaquin alone since today is D#4 of empiric coverage with no reported positive cultures. Total duration recommendation for ~7-8 days 5. Will continue to follow HD schedule/duration, culture results, LOT, and antibiotic de-escalation plans   Georgina Pillion, PharmD, BCPS Clinical Pharmacist Pager: [phone] 11/02/2011 2:15 PM

## 2011-11-02 NOTE — Progress Notes (Signed)
[facility] PROGRESS NOTE  [name] WUJ:[id number] DOB: 1945/04/13 DOA: 10/30/2011 PCP: Todd Bark, [name]  Assessment/Plan: 1. Hypotension - improved, plan to give dialysis on Saturday and see how patient tolerates, plan to change to Po abx and d/c Sunday.  Patient does not look septic.  #2. Pneumonia - given patient's chest x-ray finding and leukocytosis patient will be treated as health care associated pneumonia. WC trending downward. Continue vancomycin azactam and Levaquin day #4. Non-toxic appearing. #3. ESRD on hemodialysis on Tuesday Thursdays and Saturdays - appreciate nephrology assistance. .   #4. Diabetes mellitus type 2 - continue home medications and sliding scale coverage and close followup of CBG. Range 113,117.- is not compliant with diet- will decrease lantus for today and while in the [place] #5. History of CAD and cardiomyopathy last EF was 45% - presently chest pain-free and denies any shortness of breath. Continue aspirin.  #6. Anemia probably from chronic kidney disease - trending down slightly. Currently 9.3. Chart review indicates baseline 10-11.  continue to monitor CBC.  #7. History of chronic lower extremity pain on pain medications probably from neuropathy.     Code Status: full Family Communication:  Disposition Plan: home when medically stable- hope for sunday   Consultants:  nephro  Procedures:  none  Antibiotics: vanc 10/29  azactam 10/29  Levaquin: 10/29   HPI/Subjective: Feeling better today No new c/o No fever, no chills On 3L O2 at home    Objective: Filed Vitals:   11/01/11 1807 11/01/11 2141 11/02/11 0623 11/02/11 1000  BP: 122/82 123/62 134/60 135/54  Pulse: 100 82 79 89  Temp: 97.4 F (36.3 C) 98.7 F (37.1 C) 97.2 F (36.2 C) 98.4 F (36.9 C)  TempSrc: Oral Oral Oral Oral  Resp: 18 18 18 18   Height:  5' 11.5" (1.816 m)    Weight:  102.8 kg (226 lb 10.1 oz)    SpO2: 92% 98% 100% 98%    Intake/Output  Summary (Last 24 hours) at 11/02/11 1138 Last data filed at 11/02/11 0900  Gross per 24 hour  Intake   1520 ml  Output   2755 ml  Net  -1235 ml   Filed Weights   11/01/11 0720 11/01/11 1141 11/01/11 2141  Weight: 104.6 kg (230 lb 9.6 oz) 102.1 kg (225 lb 1.4 oz) 102.8 kg (226 lb 10.1 oz)    Exam:   General:  Alert,oriented x3  Cardiovascular: RRR No MGR   Respiratory: normal effort. BS somewhat coarse . No wheeze  Abdomen: obese, soft +BS non-tender to palp  Data Reviewed: Basic Metabolic Panel:  Lab 11/02/11 9562 11/01/11 0739 10/31/11 0440 10/30/11 2129  NA 136 130* 134* 138  K 5.1 5.4* 4.5 4.3  CL 96 89* 95* 96  CO2 28 24 25 27   GLUCOSE 62* 53* 174* 189*  BUN 38* 69* 53* 46*  CREATININE 7.05* 10.16* 8.16* 7.82*  CALCIUM 9.7 8.6 8.6 9.0  MG -- -- -- --  PHOS -- 10.2* -- --   Liver Function Tests:  Lab 11/01/11 0739 10/30/11 2129  AST -- 15  ALT -- 13  ALKPHOS -- 76  BILITOT -- 0.2*  PROT -- 6.8  ALBUMIN 2.8* 3.2*   No results found for this basename: LIPASE:5,AMYLASE:5 in the last 168 hours No results found for this basename: AMMONIA:5 in the last 168 hours CBC:  Lab 11/02/11 0542 11/01/11 0740 10/31/11 0440 10/30/11 2129  WBC 7.8 11.8* 12.9* 15.8*  NEUTROABS -- -- -- 11.8*  HGB 9.6* 9.3* 9.6* 10.4*  HCT 29.5* 28.4* 29.5* 31.3*  MCV 94.9 91.6 93.7 93.2  PLT 237 227 188 203   Cardiac Enzymes:  Lab 10/30/11 2109  CKTOTAL --  CKMB --  CKMBINDEX --  TROPONINI <0.30   BNP (last 3 results) No results found for this basename: PROBNP:3 in the last 8760 hours CBG:  Lab 11/02/11 0855 11/02/11 0800 11/01/11 1714 11/01/11 1410 11/01/11 1254  GLUCAP 135* 45* 164* 160* 101*    Recent Results (from the past 240 hour(s))  MRSA PCR SCREENING     Status: Normal   Collection Time   10/30/11  7:47 PM      Component Value Range Status Comment   MRSA by PCR NEGATIVE  NEGATIVE Final      Studies: No results found.  Scheduled Meds:    . aspirin  325  mg Oral Daily  . aztreonam  500 mg Intravenous Q12H  . calcium acetate  2,668 mg Oral TID WC  . cinacalcet  60 mg Oral Daily  . darbepoetin (ARANESP) injection - DIALYSIS  40 mcg Intravenous Q Thu-HD  . diphenhydrAMINE  12.5 mg Oral Once  . glucose-Vitamin C      . heparin  5,000 Units Subcutaneous Q8H  . heparin  9,000 Units Dialysis Once in dialysis  . insulin aspart  0-20 Units Subcutaneous TID WC  . insulin glargine  50 Units Subcutaneous QHS  . levofloxacin (LEVAQUIN) IV  500 mg Intravenous Q48H  . multivitamin  1 tablet Oral QHS  . pantoprazole  40 mg Oral Daily  . rOPINIRole  0.25 mg Oral BID  . rOPINIRole  0.5 mg Oral QHS  . simvastatin  20 mg Oral q1800  . sodium chloride  250 mL Intravenous Once  . sodium chloride  3 mL Intravenous Q12H  . sodium chloride  3 mL Intravenous Q12H  . vancomycin  1,000 mg Intravenous Q T,Th,Sa-HD  . DISCONTD: diphenhydrAMINE  25 mg Oral Once  . DISCONTD: insulin glargine  68 Units Subcutaneous QHS   Continuous Infusions:   Principal Problem:  *Hypotension Active Problems:  Pneumonia  ESRD (end stage renal disease) on dialysis  DM2 (diabetes mellitus, type 2)  Cardiomyopathy  Anemia    Time spent: 30 minutes    VANN, JESSICA  [facility] . If 8PM-8AM, please contact night-coverage at [email or web address], password [name] 11/02/2011, 11:38 AM  LOS: 3 days

## 2011-11-03 ENCOUNTER — Inpatient Hospital Stay (HOSPITAL_COMMUNITY): Payer: Medicare Other

## 2011-11-03 DIAGNOSIS — I428 Other cardiomyopathies: Secondary | ICD-10-CM

## 2011-11-03 LAB — CBC
HCT: 27.4 % — ABNORMAL LOW (ref 39.0–52.0)
Hemoglobin: 9 g/dL — ABNORMAL LOW (ref 13.0–17.0)
MCH: 30.4 pg (ref 26.0–34.0)
MCHC: 32.8 g/dL (ref 30.0–36.0)
MCV: 92.6 fL (ref 78.0–100.0)
Platelets: 255 10*3/uL (ref 150–400)
RBC: 2.96 MIL/uL — ABNORMAL LOW (ref 4.22–5.81)
RDW: 12.3 % (ref 11.5–15.5)
WBC: 8.8 10*3/uL (ref 4.0–10.5)

## 2011-11-03 LAB — RENAL FUNCTION PANEL
Albumin: 2.8 g/dL — ABNORMAL LOW (ref 3.5–5.2)
BUN: 59 mg/dL — ABNORMAL HIGH (ref 6–23)
CO2: 25 mEq/L (ref 19–32)
Calcium: 9.8 mg/dL (ref 8.4–10.5)
Chloride: 93 mEq/L — ABNORMAL LOW (ref 96–112)
Creatinine, Ser: 9.12 mg/dL — ABNORMAL HIGH (ref 0.50–1.35)
GFR calc Af Amer: 6 mL/min — ABNORMAL LOW (ref >90)
GFR calc non Af Amer: 5 mL/min — ABNORMAL LOW (ref >90)
Glucose, Bld: 95 mg/dL (ref 70–99)
Phosphorus: 7.9 mg/dL — ABNORMAL HIGH (ref 2.3–4.6)
Potassium: 6.2 mEq/L — ABNORMAL HIGH (ref 3.5–5.1)
Sodium: 132 mEq/L — ABNORMAL LOW (ref 135–145)

## 2011-11-03 LAB — GLUCOSE, CAPILLARY
Glucose-Capillary: 55 mg/dL — ABNORMAL LOW (ref 70–99)
Glucose-Capillary: 91 mg/dL (ref 70–99)
Glucose-Capillary: 81 mg/dL (ref 70–99)

## 2011-11-03 MED ORDER — OXYCODONE HCL 5 MG PO TABS
ORAL_TABLET | ORAL | Status: AC
  Administered 2011-11-03: 10 mg via ORAL
  Filled 2011-11-03: qty 2

## 2011-11-03 MED ORDER — INSULIN GLARGINE 100 UNIT/ML ~~LOC~~ SOLN
25.0000 [IU] | Freq: Once | SUBCUTANEOUS | Status: AC
  Administered 2011-11-03: 25 [IU] via SUBCUTANEOUS

## 2011-11-03 MED ORDER — MIDODRINE HCL 5 MG PO TABS
ORAL_TABLET | ORAL | Status: AC
  Administered 2011-11-03: 5 mg via ORAL
  Filled 2011-11-03: qty 1

## 2011-11-03 NOTE — Progress Notes (Signed)
I have seen and examined this patient and agree with plan as outline by M. Ronnie Doss A,MD 11/03/2011 12:38 PM

## 2011-11-03 NOTE — Progress Notes (Signed)
[facility] Progress Note  Subjective:  No complaints.   Objective Filed Vitals:   11/02/11 1800 11/02/11 2111 11/03/11 0442 11/03/11 1033  BP: 135/75 129/41 129/59 133/51  Pulse: 120 68 72 71  Temp: 98.2 F (36.8 C) 97.6 F (36.4 C) 98 F (36.7 C) 97.5 F (36.4 C)  TempSrc: Oral Oral Oral Oral  Resp: 18 18 18 18   Height:      Weight:  103 kg (227 lb 1.2 oz)    SpO2: 94% 100% 98% 100%   Physical Exam General: comfortable in bed waiting for lunch Heart: RRR Lungs: grossly clear without wheezes or rhonchi, though BS somewhat diminished at bases Abdomen: obese soft Extremities: no LE edema Dialysis Access: left I-J;   Dialyzes at [facility] on TTS. Primary Nephrologist Patel. EDW 97kg.  HD Bath 2K/2.5Ca, Dialyzer 180NRe, Heparin 9000. Access LIJ PC.Epo 2600 zemplar 2 3.75 hr   Assessment/Plan:  1. Hypotension post HD 10/29 - EDW increased to 99, but still above. UF 2.3 Thursday with post wt of 102.8. BP better. Med list at dialysis shows he takes midodrine 5mg  before HD- likely didn't take this either pre HD Weight last pm 103; 2. Pneumonia - antibiotics changed to po levaquin   2. ESRD - TTS - for today; refuses perm access due to prior steal problems; This has ben discussed again with pt this admission and pt refuses. Running 4 hours instead of usual 3.75 due to volume.  3. Anemia - Hgb 9.6 - stable - dosed aranesp 40 10/31. - check labs pre HD 4. Secondary hyperparathyroidism - P 10.2 - resume binders - obviously NOT taking PTA.; resume phoslo - 4 ac; getting sensipar here, although he doesn't know if he takes it and considering his P is 10 he likely does not take it or sensipar. Holding zemplar due to high Ca and P product. - Last iPTH 1300s  6. Nutrition - change to renal diet with fluid restriction.  7. Medical noncompliance 8. DM - per primary;episodes of hypoglycemia due to variable intake in hospital compared with home- and/orlevaquin. 9. Disp - per primary for  probably d/c Sunday.  Sheffield Slider, PA-C [facility] Beeper [phone]  11/03/2011,11:01 AM  LOS: 4 days    Additional Objective Labs: Basic Metabolic Panel:  Lab 11/02/11 1478 11/01/11 0739 10/31/11 0440  NA 136 130* 134*  K 5.1 5.4* 4.5  CL 96 89* 95*  CO2 28 24 25   GLUCOSE 62* 53* 174*  BUN 38* 69* 53*  CREATININE 7.05* 10.16* 8.16*  CALCIUM 9.7 8.6 8.6  ALB -- -- --  PHOS -- 10.2* --   Liver Function Tests:  Lab 11/01/11 0739 10/30/11 2129  AST -- 15  ALT -- 13  ALKPHOS -- 76  BILITOT -- 0.2*  PROT -- 6.8  ALBUMIN 2.8* 3.2*  CBC:  Lab 11/02/11 0542 11/01/11 0740 10/31/11 0440 10/30/11 2129  WBC 7.8 11.8* 12.9* --  NEUTROABS -- -- -- 11.8*  HGB 9.6* 9.3* 9.6* --  HCT 29.5* 28.4* 29.5* --  MCV 94.9 91.6 93.7 93.2  PLT 237 227 188 --  CBG:  Lab 11/03/11 0839 11/03/11 0813 11/02/11 2110 11/02/11 1702 11/02/11 1155  GLUCAP 91 55* 132* 112* 148*  Medications:      . aspirin  325 mg Oral Daily  . calcium acetate  2,668 mg Oral TID WC  . cinacalcet  60 mg Oral Daily  . darbepoetin (ARANESP) injection - DIALYSIS  40 mcg Intravenous Q  Thu-HD  . insulin aspart  0-20 Units Subcutaneous TID WC  . insulin glargine  50 Units Subcutaneous QHS  . levofloxacin  500 mg Oral Q48H  . midodrine  5 mg Oral Q T,Th,Sa-HD  . multivitamin  1 tablet Oral QHS  . pantoprazole  40 mg Oral Daily  . rOPINIRole  0.25 mg Oral BID  . rOPINIRole  0.5 mg Oral QHS  . simvastatin  20 mg Oral q1800  . sodium chloride  3 mL Intravenous Q12H  . sodium chloride  3 mL Intravenous Q12H  . DISCONTD: aztreonam  1 g Intravenous Q24H  . DISCONTD: aztreonam  500 mg Intravenous Q12H  . DISCONTD: heparin  5,000 Units Subcutaneous Q8H  . DISCONTD: heparin  9,000 Units Dialysis Once in dialysis  . DISCONTD: levofloxacin (LEVAQUIN) IV  500 mg Intravenous Q48H  . DISCONTD: vancomycin  1,000 mg Intravenous Q T,Th,Sa-HD

## 2011-11-03 NOTE — Progress Notes (Signed)
[facility] PROGRESS NOTE  Todd GOYTIA OZH:[id number] DOB: 03/09/1945 DOA: 10/30/2011 PCP: Lizbeth Bark, FNP  Assessment/Plan: 1. Hypotension - improved, plan to give dialysis on Saturday and see how patient tolerates, plan to change to Po abx and d/c Sunday.  Patient does not look septic.   #2. Pneumonia - given patient's chest x-ray finding and leukocytosis patient will be treated as health care associated pneumonia. WC trending downward. Continue  Levaquin day #5. Non-toxic appearing.  #3. ESRD on hemodialysis on Tuesday Thursdays and Saturdays - appreciate nephrology assistance.     #4. Diabetes mellitus type 2 - continue home medications and sliding scale coverage and close followup of CBG. Range 113,117.- is not compliant with diet- will decrease lantus for today and while in the hospital  #5. History of CAD and cardiomyopathy last EF was 45% - presently chest pain-free and denies any shortness of breath. Continue aspirin.   #6. Anemia probably from chronic kidney disease - trending down slightly. Currently 9.3. Chart review indicates baseline 10-11.  continue to monitor CBC.   #7. History of chronic lower extremity pain on pain medications probably from neuropathy.     Code Status: full Family Communication:  Disposition Plan: home tomm if BP stable during the dialysis   Consultants:  nephro  Procedures:  none  Antibiotics: vanc 10/29  azactam 10/29  Levaquin: 10/29   HPI/Subjective: No new c/o On 3L O2 at home    Objective: Filed Vitals:   11/02/11 1800 11/02/11 2111 11/03/11 0442 11/03/11 1033  BP: 135/75 129/41 129/59 133/51  Pulse: 120 68 72 71  Temp: 98.2 F (36.8 C) 97.6 F (36.4 C) 98 F (36.7 C) 97.5 F (36.4 C)  TempSrc: Oral Oral Oral Oral  Resp: 18 18 18 18   Height:      Weight:  103 kg (227 lb 1.2 oz)    SpO2: 94% 100% 98% 100%    Intake/Output Summary (Last 24 hours) at 11/03/11 1107 Last data filed at 11/03/11 0600  Gross  per 24 hour  Intake    770 ml  Output    500 ml  Net    270 ml   Filed Weights   11/01/11 1141 11/01/11 2141 11/02/11 2111  Weight: 102.1 kg (225 lb 1.4 oz) 102.8 kg (226 lb 10.1 oz) 103 kg (227 lb 1.2 oz)    Exam:   General:  Alert,oriented x3  Cardiovascular: RRR No MGR   Respiratory: normal effort. BS somewhat coarse . No wheeze  Abdomen: obese, soft +BS non-tender to palp  Data Reviewed: Basic Metabolic Panel:  Lab 11/02/11 6295 11/01/11 0739 10/31/11 0440 10/30/11 2129  NA 136 130* 134* 138  K 5.1 5.4* 4.5 4.3  CL 96 89* 95* 96  CO2 28 24 25 27   GLUCOSE 62* 53* 174* 189*  BUN 38* 69* 53* 46*  CREATININE 7.05* 10.16* 8.16* 7.82*  CALCIUM 9.7 8.6 8.6 9.0  MG -- -- -- --  PHOS -- 10.2* -- --   Liver Function Tests:  Lab 11/01/11 0739 10/30/11 2129  AST -- 15  ALT -- 13  ALKPHOS -- 76  BILITOT -- 0.2*  PROT -- 6.8  ALBUMIN 2.8* 3.2*   No results found for this basename: LIPASE:5,AMYLASE:5 in the last 168 hours No results found for this basename: AMMONIA:5 in the last 168 hours CBC:  Lab 11/02/11 0542 11/01/11 0740 10/31/11 0440 10/30/11 2129  WBC 7.8 11.8* 12.9* 15.8*  NEUTROABS -- -- -- 11.8*  HGB  9.6* 9.3* 9.6* 10.4*  HCT 29.5* 28.4* 29.5* 31.3*  MCV 94.9 91.6 93.7 93.2  PLT 237 227 188 203   Cardiac Enzymes:  Lab 10/30/11 2109  CKTOTAL --  CKMB --  CKMBINDEX --  TROPONINI <0.30   BNP (last 3 results) No results found for this basename: PROBNP:3 in the last 8760 hours CBG:  Lab 11/03/11 0839 11/03/11 0813 11/02/11 2110 11/02/11 1702 11/02/11 1155  GLUCAP 91 55* 132* 112* 148*    Recent Results (from the past 240 hour(s))  MRSA PCR SCREENING     Status: Normal   Collection Time   10/30/11  7:47 PM      Component Value Range Status Comment   MRSA by PCR NEGATIVE  NEGATIVE Final      Studies: No results found.  Scheduled Meds:    . aspirin  325 mg Oral Daily  . calcium acetate  2,668 mg Oral TID WC  . cinacalcet  60 mg Oral  Daily  . darbepoetin (ARANESP) injection - DIALYSIS  40 mcg Intravenous Q Thu-HD  . insulin aspart  0-20 Units Subcutaneous TID WC  . insulin glargine  50 Units Subcutaneous QHS  . levofloxacin  500 mg Oral Q48H  . midodrine  5 mg Oral Q T,Th,Sa-HD  . multivitamin  1 tablet Oral QHS  . pantoprazole  40 mg Oral Daily  . rOPINIRole  0.25 mg Oral BID  . rOPINIRole  0.5 mg Oral QHS  . simvastatin  20 mg Oral q1800  . sodium chloride  3 mL Intravenous Q12H  . sodium chloride  3 mL Intravenous Q12H  . DISCONTD: aztreonam  1 g Intravenous Q24H  . DISCONTD: aztreonam  500 mg Intravenous Q12H  . DISCONTD: heparin  5,000 Units Subcutaneous Q8H  . DISCONTD: heparin  9,000 Units Dialysis Once in dialysis  . DISCONTD: levofloxacin (LEVAQUIN) IV  500 mg Intravenous Q48H  . DISCONTD: vancomycin  1,000 mg Intravenous Q T,Th,Sa-HD   Continuous Infusions:   Principal Problem:  *Hypotension Active Problems:  Pneumonia  ESRD (end stage renal disease) on dialysis  DM2 (diabetes mellitus, type 2)  Cardiomyopathy  Anemia    Time spent: 30 minutes    Keene Gilkey  [facility] . If 8PM-8AM, please contact night-coverage at [email or web address], password [facility] 11/03/2011, 11:07 AM  LOS: 4 days

## 2011-11-04 DIAGNOSIS — H543 Unqualified visual loss, both eyes: Secondary | ICD-10-CM

## 2011-11-04 LAB — GLUCOSE, CAPILLARY: Glucose-Capillary: 76 mg/dL (ref 70–99)

## 2011-11-04 MED ORDER — LEVOFLOXACIN 500 MG PO TABS: 500.0000 mg | ORAL_TABLET | ORAL | Status: DC

## 2011-11-04 MED ORDER — DARBEPOETIN ALFA-POLYSORBATE 40 MCG/0.4ML IJ SOLN: 40.0000 ug | INTRAMUSCULAR | Status: DC

## 2011-11-04 MED ORDER — MIDODRINE HCL 5 MG PO TABS: 5.0000 mg | ORAL_TABLET | ORAL | Status: DC

## 2011-11-04 MED ORDER — CALCIUM ACETATE 667 MG PO CAPS: 2866.0000 mg | ORAL_CAPSULE | Freq: Three times a day (TID) | ORAL | Status: DC

## 2011-11-04 MED ORDER — LACTULOSE 10 GM/15ML PO SOLN
10.0000 g | Freq: Once | ORAL | Status: AC
  Administered 2011-11-04: 10 g via ORAL
  Filled 2011-11-04: qty 15

## 2011-11-04 MED ORDER — INSULIN GLARGINE 100 UNIT/ML ~~LOC~~ SOLN: 50.0000 [IU] | Freq: Every day | SUBCUTANEOUS | Status: DC

## 2011-11-04 NOTE — Discharge Summary (Signed)
Physician Discharge Summary  [name]:[id number] DOB: 09/12/1945 DOA: 10/30/2011  PCP: Lizbeth Bark, FNP  Admit date: 10/30/2011 Discharge date: 11/05/2011  Time spent: 35 minutes  Recommendations for Outpatient Follow-up:  1. HD T/Th/Sat  Discharge Diagnoses:  Principal Problem:  *Hypotension Active Problems:  Pneumonia  ESRD (end stage renal disease) on dialysis  DM2 (diabetes mellitus, type 2)  Cardiomyopathy  Anemia   Discharge Condition: improved  Diet recommendation: renal/diabetic  Filed Weights   11/03/11 1136 11/03/11 1559 11/03/11 2218  Weight: 106.7 kg (235 lb 3.7 oz) 104.4 kg (230 lb 2.6 oz) 104.1 kg (229 lb 8 oz)    History of present illness:  66 year-old male with history of ESRD on hemodialysis on Tuesday Thursdays and Saturday was brought to the ER at [facility] patient was found to be hypotensive and confused. Patient has been running low blood pressure for the last one month as per the patient and he has taken off antihypertensives. His blood pressure was found to be in the 70s which is lower than what he usually does. In the ER at [facility] patient was given a fluid bolus after which his blood pressure stayed stable 100 systolic and chest x-ray showed pneumonic process and blood work shows leukocytosis. Patient was transferred to [facility] for further management. Patient presently denies any chest pain or shortness of breath. Patient states 2 days ago he had nausea and vomiting which patient states has resolved and presently denies any abdominal pain or diarrhea and wants to eat something. Patient otherwise has no focal deficits headache fever chills.   Hospital Course:  1. Hypotension - improved, plan to give dialysis on Saturday and see how patient tolerates, plan to change to Po abx and d/c Sunday. Patient does not look septic.  #2. Pneumonia - given patient's chest x-ray finding and leukocytosis patient will be treated as health  care associated pneumonia. WC trending downward. Continue Levaquin. Non-toxic appearing.  #3. ESRD on hemodialysis on Tuesday Thursdays and Saturdays - appreciate nephrology assistance.  #4. Diabetes mellitus type 2 - continue home medications and sliding scale coverage and close followup of CBG. Range 113,117.- is not compliant with diet- (eats candy and sausage)  will decrease lantus while in the hospital  #5. History of CAD and cardiomyopathy last EF was 45% - presently chest pain-free and denies any shortness of breath. Continue aspirin.  #6. Anemia probably from chronic kidney disease - trending down slightly. Currently 9.3. Chart review indicates baseline 10-11. continue to monitor CBC.  #7. History of chronic lower extremity pain on pain medications probably from neuropathy   Consultations:  renal  Discharge Exam: Filed Vitals:   11/03/11 1722 11/03/11 2218 11/04/11 0457 11/04/11 1000  BP: 137/65 130/70 129/52 156/75  Pulse: 101 89 76 72  Temp: 98.3 F (36.8 C) 98.6 F (37 C) 98.4 F (36.9 C) 98 F (36.7 C)  TempSrc: Oral Oral Oral Oral  Resp: 20 20 18 18   Height:      Weight:  104.1 kg (229 lb 8 oz)    SpO2: 100% 100% 100% 100%    General: A+Ox3, NAD Cardiovascular: rrr Respiratory: dec BS b/l  Discharge Instructions      Discharge Orders    Future Orders Please Complete By Expires   Increase activity slowly      Discharge instructions      Comments:   Renal/diabetic Midodrine before dialysis Continue fluid restriction May need titration of lantus- depending on diet  followed at home       Medication List     As of 11/05/2011  5:20 PM    TAKE these medications         aspirin 325 MG tablet   Take 325 mg by mouth daily.      calcium acetate 667 MG capsule   Commonly known as: PHOSLO   Take 4 capsules (2,668 mg total) by mouth 3 (three) times daily with meals.      cinacalcet 60 MG tablet   Commonly known as: SENSIPAR   Take 60 mg by mouth daily.       darbepoetin 40 MCG/0.4ML Soln   Commonly known as: ARANESP   Inject 0.4 mLs (40 mcg total) into the vein every Thursday with hemodialysis.      folic acid-vitamin b complex-vitamin c-selenium-zinc 3 MG Tabs   Take 1 tablet by mouth daily.      insulin aspart 100 UNIT/ML injection   Commonly known as: novoLOG   Inject 20-40 Units into the skin 3 (three) times daily before meals. Sliding scale      insulin glargine 100 UNIT/ML injection   Commonly known as: LANTUS   Inject 50 Units into the skin at bedtime.      levofloxacin 500 MG tablet   Commonly known as: LEVAQUIN   Take 1 tablet (500 mg total) by mouth every other day.      midodrine 5 MG tablet   Commonly known as: PROAMATINE   Take 1 tablet (5 mg total) by mouth Every Tuesday,Thursday,and Saturday with dialysis.      omeprazole 20 MG capsule   Commonly known as: PRILOSEC   Take 20 mg by mouth 2 (two) times daily.      oxycodone 5 MG capsule   Commonly known as: OXY-IR   Take 10 mg by mouth every 6 (six) hours as needed. For pain      pravastatin 40 MG tablet   Commonly known as: PRAVACHOL   Take 40 mg by mouth Nightly.      rOPINIRole 0.25 MG tablet   Commonly known as: REQUIP   Take 0.25-5 mg by mouth 3 (three) times daily. 1 tablet twice a day and 2 tablets at bedtime      sennosides-docusate sodium 8.6-50 MG tablet   Commonly known as: SENOKOT-S   Take 2 tablets by mouth 2 (two) times daily as needed. For constipation        Follow-up Information    Follow up with Lizbeth Bark, FNP. In 1 week.   Contact information:   [address] [place] [address] [phone]       Please follow up. (HD t/th/sat)           The results of significant diagnostics from this hospitalization (including imaging, microbiology, ancillary and laboratory) are listed below for reference.    Significant Diagnostic Studies: No results found.  Microbiology: Recent Results (from the past 240 hour(s))  MRSA  PCR SCREENING     Status: Normal   Collection Time   10/30/11  7:47 PM      Component Value Range Status Comment   MRSA by PCR NEGATIVE  NEGATIVE Final      Labs: Basic Metabolic Panel:  Lab 11/03/11 2130 11/02/11 0542 11/01/11 0739 10/31/11 0440 10/30/11 2129  NA 132* 136 130* 134* 138  K 6.2* 5.1 5.4* 4.5 4.3  CL 93* 96 89* 95* 96  CO2 25 28 24 25  27  GLUCOSE 95 62* 53* 174* 189*  BUN 59* 38* 69* 53* 46*  CREATININE 9.12* 7.05* 10.16* 8.16* 7.82*  CALCIUM 9.8 9.7 8.6 8.6 9.0  MG -- -- -- -- --  PHOS 7.9* -- 10.2* -- --   Liver Function Tests:  Lab 11/03/11 1210 11/01/11 0739 10/30/11 2129  AST -- -- 15  ALT -- -- 13  ALKPHOS -- -- 76  BILITOT -- -- 0.2*  PROT -- -- 6.8  ALBUMIN 2.8* 2.8* 3.2*   No results found for this basename: LIPASE:5,AMYLASE:5 in the last 168 hours No results found for this basename: AMMONIA:5 in the last 168 hours CBC:  Lab 11/03/11 1210 11/02/11 0542 11/01/11 0740 10/31/11 0440 10/30/11 2129  WBC 8.8 7.8 11.8* 12.9* 15.8*  NEUTROABS -- -- -- -- 11.8*  HGB 9.0* 9.6* 9.3* 9.6* 10.4*  HCT 27.4* 29.5* 28.4* 29.5* 31.3*  MCV 92.6 94.9 91.6 93.7 93.2  PLT 255 237 227 188 203   Cardiac Enzymes:  Lab 10/30/11 2109  CKTOTAL --  CKMB --  CKMBINDEX --  TROPONINI <0.30   BNP: BNP (last 3 results) No results found for this basename: PROBNP:3 in the last 8760 hours CBG:  Lab 11/04/11 1205 11/04/11 0734 11/03/11 2249 11/03/11 1718 11/03/11 0839  GLUCAP 76 68* 105* 81 91       Signed:  Kairon Shock  [facility] 11/05/2011, 5:20 PM

## 2011-11-04 NOTE — Progress Notes (Signed)
Subjective:  No cos Objective Vital signs in last 24 hours: Filed Vitals:   11/03/11 1722 11/03/11 2218 11/04/11 0457 11/04/11 1000  BP: 137/65 130/70 129/52 156/75  Pulse: 101 89 76 72  Temp: 98.3 F (36.8 C) 98.6 F (37 C) 98.4 F (36.9 C) 98 F (36.7 C)  TempSrc: Oral Oral Oral Oral  Resp: 20 20 18 18   Height:      Weight:  104.1 kg (229 lb 8 oz)    SpO2: 100% 100% 100% 100%   Weight change: 3.7 kg (8 lb 2.5 oz)  Intake/Output Summary (Last 24 hours) at 11/04/11 1141 Last data filed at 11/04/11 0900  Gross per 24 hour  Intake    120 ml  Output   1900 ml  Net  -1780 ml   Labs: Basic Metabolic Panel:  Lab 11/03/11 1610 11/02/11 0542 11/01/11 0739  NA 132* 136 130*  K 6.2* 5.1 5.4*  CL 93* 96 89*  CO2 25 28 24   GLUCOSE 95 62* 53*  BUN 59* 38* 69*  CREATININE 9.12* 7.05* 10.16*  CALCIUM 9.8 9.7 8.6  ALB -- -- --  PHOS 7.9* -- 10.2*   Liver Function Tests:  Lab 11/03/11 1210 11/01/11 0739 10/30/11 2129  AST -- -- 15  ALT -- -- 13  ALKPHOS -- -- 76  BILITOT -- -- 0.2*  PROT -- -- 6.8  ALBUMIN 2.8* 2.8* 3.2*   No results found for this basename: LIPASE:3,AMYLASE:3 in the last 168 hours No results found for this basename: AMMONIA:3 in the last 168 hours CBC:  Lab 11/03/11 1210 11/02/11 0542 11/01/11 0740 10/31/11 0440 10/30/11 2129  WBC 8.8 7.8 11.8* -- --  NEUTROABS -- -- -- -- 11.8*  HGB 9.0* 9.6* 9.3* -- --  HCT 27.4* 29.5* 28.4* -- --  MCV 92.6 94.9 91.6 93.7 93.2  PLT 255 237 227 -- --   Cardiac Enzymes:  Lab 10/30/11 2109  CKTOTAL --  CKMB --  CKMBINDEX --  TROPONINI <0.30   CBG:  Lab 11/04/11 0734 11/03/11 2249 11/03/11 1718 11/03/11 0839 11/03/11 0813  GLUCAP 68* 105* 81 91 55*    Iron Studies: No results found for this basename: IRON,TIBC,TRANSFERRIN,FERRITIN in the last 72 hours Studies/Results: No results found. Medications:      . aspirin  325 mg Oral Daily  . calcium acetate  2,668 mg Oral TID WC  . cinacalcet  60 mg Oral  Daily  . darbepoetin (ARANESP) injection - DIALYSIS  40 mcg Intravenous Q Thu-HD  . insulin aspart  0-20 Units Subcutaneous TID WC  . [COMPLETED] insulin glargine  25 Units Subcutaneous Once  . insulin glargine  50 Units Subcutaneous QHS  . lactulose  10 g Oral Once  . levofloxacin  500 mg Oral Q48H  . midodrine  5 mg Oral Q T,Th,Sa-HD  . multivitamin  1 tablet Oral QHS  . pantoprazole  40 mg Oral Daily  . rOPINIRole  0.25 mg Oral BID  . rOPINIRole  0.5 mg Oral QHS  . simvastatin  20 mg Oral q1800  . sodium chloride  3 mL Intravenous Q12H  . sodium chloride  3 mL Intravenous Q12H   I  have reviewed scheduled and prn medications.  Physical Exam: General: Alert, NAd Heart: RRR, no rub,  Lungs: Decreased at bases bilaterally Abdomen: bs pos. Obese, nontender Extremities: Dialysis Access:  No pedal edema, perm cath  Left i  Dialyzes at [facility] on TTS. Primary Nephrologist Patel. EDW 97kg.  HD  Bath 2K/2.5Ca, Dialyzer 180NRe, Heparin 9000. Access [facility].Epo 2600 zemplar 2 3.75 hr   Assess  Assessment/Plan:  1. Hypotension post HD 10/29 -  Am bp 158/75  HD yesteday Wt 106.7 to 104.1 with EDW increased compared to out pt., but still above.   BP better. Med list at dialysis shows he takes midodrine 5mg  before hd 2. Pneumonia - antibiotics changed to po levaquin  2. ESRD - TTS - refuses perm access due to prior steal problems; This has ben discussed again with pt this admission and pt refuses. Running 4 hours instead of usual 3.75 due to volume.  3. Anemia - Hgb 9.0 - stable - dosed aranesp 40 10/31. -   4. Secondary hyperparathyroidism - P 10.2 - resume binders - phoslo 4ac and Holding zemplar due to high Ca and P product. - Last iPTH 1300s  6. Nutrition -  renal diet with fluid restriction.  7. Medical noncompliance 8. DM - per primary;episodes of hypoglycemia due to variable intake in hospital compared with home- and/orlevaquin.  9. Disp - per primary for probably d/c today   Todd Pastel, PA-C [facility] Bee[facility] [phone] 11/04/2011,11:41 AM  LOS: 5 days

## 2011-11-04 NOTE — Progress Notes (Signed)
I have seen and examined this patient and agree with plan as outlined by D. Zeyfang, PA-C. Murna Backer A,MD 11/04/2011 12:02 PM   

## 2011-12-11 ENCOUNTER — Emergency Department (HOSPITAL_COMMUNITY): Payer: Medicare Other

## 2011-12-11 ENCOUNTER — Encounter (HOSPITAL_COMMUNITY)
Admission: EM | Disposition: A | Discharge status: Home / Self Care | Payer: Self-pay | Source: Home / Self Care | Attending: Emergency Medicine

## 2011-12-11 ENCOUNTER — Ambulatory Visit (HOSPITAL_COMMUNITY): Admit: 2011-12-11 | Payer: Self-pay | Admitting: Cardiovascular Disease

## 2011-12-11 ENCOUNTER — Encounter (HOSPITAL_COMMUNITY): Payer: Self-pay | Admitting: Emergency Medicine

## 2011-12-11 ENCOUNTER — Observation Stay (HOSPITAL_COMMUNITY)
Admission: EM | Admit: 2011-12-11 | Discharge: 2011-12-12 | Disposition: A | Discharge status: Home / Self Care | Payer: Medicare Other | Attending: Internal Medicine | Admitting: Internal Medicine

## 2011-12-11 ENCOUNTER — Observation Stay (HOSPITAL_COMMUNITY): Payer: Medicare Other

## 2011-12-11 DIAGNOSIS — E119 Type 2 diabetes mellitus without complications: Secondary | ICD-10-CM | POA: Diagnosis present

## 2011-12-11 DIAGNOSIS — H548 Legal blindness, as defined in USA: Secondary | ICD-10-CM | POA: Insufficient documentation

## 2011-12-11 DIAGNOSIS — H547 Unspecified visual loss: Secondary | ICD-10-CM | POA: Diagnosis present

## 2011-12-11 DIAGNOSIS — I429 Cardiomyopathy, unspecified: Secondary | ICD-10-CM | POA: Diagnosis present

## 2011-12-11 DIAGNOSIS — R112 Nausea with vomiting, unspecified: Secondary | ICD-10-CM

## 2011-12-11 DIAGNOSIS — R55 Syncope and collapse: Principal | ICD-10-CM | POA: Diagnosis present

## 2011-12-11 DIAGNOSIS — J189 Pneumonia, unspecified organism: Secondary | ICD-10-CM

## 2011-12-11 DIAGNOSIS — I428 Other cardiomyopathies: Secondary | ICD-10-CM | POA: Insufficient documentation

## 2011-12-11 DIAGNOSIS — D649 Anemia, unspecified: Secondary | ICD-10-CM | POA: Diagnosis present

## 2011-12-11 DIAGNOSIS — N186 End stage renal disease: Secondary | ICD-10-CM | POA: Diagnosis present

## 2011-12-11 DIAGNOSIS — R531 Weakness: Secondary | ICD-10-CM

## 2011-12-11 DIAGNOSIS — I959 Hypotension, unspecified: Secondary | ICD-10-CM | POA: Diagnosis present

## 2011-12-11 DIAGNOSIS — Z794 Long term (current) use of insulin: Secondary | ICD-10-CM | POA: Insufficient documentation

## 2011-12-11 DIAGNOSIS — D631 Anemia in chronic kidney disease: Secondary | ICD-10-CM | POA: Insufficient documentation

## 2011-12-11 DIAGNOSIS — Z992 Dependence on renal dialysis: Secondary | ICD-10-CM | POA: Insufficient documentation

## 2011-12-11 LAB — HEPATIC FUNCTION PANEL
ALT: 14 U/L (ref 0–53)
AST: 13 U/L (ref 0–37)
Albumin: 3.7 g/dL (ref 3.5–5.2)
Alkaline Phosphatase: 94 U/L (ref 39–117)
Bilirubin, Direct: <0.1 mg/dL (ref 0.0–0.3)
Total Bilirubin: 0.2 mg/dL — ABNORMAL LOW (ref 0.3–1.2)
Total Protein: 7.7 g/dL (ref 6.0–8.3)

## 2011-12-11 LAB — CBC WITH DIFFERENTIAL/PLATELET
Basophils Absolute: 0.1 K/uL (ref 0.0–0.1)
Basophils Relative: 1 % (ref 0–1)
Eosinophils Absolute: 0.3 K/uL (ref 0.0–0.7)
Eosinophils Relative: 2 % (ref 0–5)
HCT: 32.2 % — ABNORMAL LOW (ref 39.0–52.0)
Hemoglobin: 10.7 g/dL — ABNORMAL LOW (ref 13.0–17.0)
Lymphocytes Relative: 12 % (ref 12–46)
Lymphs Abs: 1.5 10*3/uL (ref 0.7–4.0)
MCH: 30.7 pg (ref 26.0–34.0)
MCHC: 33.2 g/dL (ref 30.0–36.0)
MCV: 92.5 fL (ref 78.0–100.0)
Monocytes Absolute: 0.8 10*3/uL (ref 0.1–1.0)
Monocytes Relative: 6 % (ref 3–12)
Neutro Abs: 10.7 10*3/uL — ABNORMAL HIGH (ref 1.7–7.7)
Neutrophils Relative %: 80 % — ABNORMAL HIGH (ref 43–77)
Platelets: 266 K/uL (ref 150–400)
RBC: 3.48 MIL/uL — ABNORMAL LOW (ref 4.22–5.81)
RDW: 13 % (ref 11.5–15.5)
WBC: 13.3 10*3/uL — ABNORMAL HIGH (ref 4.0–10.5)

## 2011-12-11 LAB — LIPASE, BLOOD: Lipase: 52 U/L (ref 11–59)

## 2011-12-11 LAB — BASIC METABOLIC PANEL
BUN: 39 mg/dL — ABNORMAL HIGH (ref 6–23)
CO2: 27 mEq/L (ref 19–32)
Chloride: 94 mEq/L — ABNORMAL LOW (ref 96–112)
Creatinine, Ser: 7.05 mg/dL — ABNORMAL HIGH (ref 0.50–1.35)
GFR calc Af Amer: 8 mL/min — ABNORMAL LOW (ref >90)
Potassium: 4.4 mEq/L (ref 3.5–5.1)

## 2011-12-11 LAB — BASIC METABOLIC PANEL WITH GFR
Calcium: 9.6 mg/dL (ref 8.4–10.5)
GFR calc non Af Amer: 7 mL/min — ABNORMAL LOW (ref >90)
Glucose, Bld: 313 mg/dL — ABNORMAL HIGH (ref 70–99)
Sodium: 136 meq/L (ref 135–145)

## 2011-12-11 LAB — POCT I-STAT TROPONIN I: Troponin i, poc: 0.02 ng/mL (ref 0.00–0.08)

## 2011-12-11 LAB — GLUCOSE, CAPILLARY
Glucose-Capillary: 281 mg/dL — ABNORMAL HIGH (ref 70–99)
Glucose-Capillary: 226 mg/dL — ABNORMAL HIGH (ref 70–99)

## 2011-12-11 SURGERY — LEFT HEART CATHETERIZATION WITH CORONARY ANGIOGRAM
Anesthesia: LOCAL

## 2011-12-11 MED ORDER — MIDODRINE HCL 5 MG PO TABS: 5.0000 mg | ORAL_TABLET | ORAL | Status: DC

## 2011-12-11 MED ORDER — ROPINIROLE HCL 0.5 MG PO TABS
0.5000 mg | ORAL_TABLET | Freq: Every day | ORAL | Status: DC
  Administered 2011-12-11: 0.5 mg via ORAL
  Filled 2011-12-11 (×2): qty 1

## 2011-12-11 MED ORDER — ONDANSETRON HCL 4 MG/2ML IJ SOLN
4.0000 mg | Freq: Once | INTRAMUSCULAR | Status: AC
  Administered 2011-12-11: 4 mg via INTRAVENOUS
  Filled 2011-12-11: qty 2

## 2011-12-11 MED ORDER — NEPHRO-VITE 0.8 MG PO TABS
1.0000 | ORAL_TABLET | Freq: Every day | ORAL | Status: DC
  Filled 2011-12-11: qty 1

## 2011-12-11 MED ORDER — SODIUM CHLORIDE 0.9 % IJ SOLN: 3.0000 mL | Freq: Two times a day (BID) | INTRAMUSCULAR | Status: DC

## 2011-12-11 MED ORDER — HEPARIN SODIUM (PORCINE) 5000 UNIT/ML IJ SOLN
5000.0000 [IU] | Freq: Three times a day (TID) | INTRAMUSCULAR | Status: DC
  Administered 2011-12-11 – 2011-12-12 (×2): 5000 [IU] via SUBCUTANEOUS
  Filled 2011-12-11 (×6): qty 1

## 2011-12-11 MED ORDER — ASPIRIN 325 MG PO TABS
325.0000 mg | ORAL_TABLET | Freq: Every day | ORAL | Status: DC
  Administered 2011-12-12: 325 mg via ORAL
  Filled 2011-12-11 (×2): qty 1

## 2011-12-11 MED ORDER — OXYCODONE HCL 5 MG PO TABS
10.0000 mg | ORAL_TABLET | Freq: Four times a day (QID) | ORAL | Status: DC | PRN
  Administered 2011-12-11 – 2011-12-12 (×3): 10 mg via ORAL
  Filled 2011-12-11 (×3): qty 2

## 2011-12-11 MED ORDER — ROPINIROLE HCL 0.25 MG PO TABS
0.2500 mg | ORAL_TABLET | ORAL | Status: DC
  Administered 2011-12-12: 0.25 mg via ORAL
  Filled 2011-12-11 (×2): qty 1

## 2011-12-11 MED ORDER — PANTOPRAZOLE SODIUM 40 MG PO TBEC
40.0000 mg | DELAYED_RELEASE_TABLET | Freq: Every day | ORAL | Status: DC
  Administered 2011-12-11 – 2011-12-12 (×2): 40 mg via ORAL
  Filled 2011-12-11 (×2): qty 1

## 2011-12-11 MED ORDER — SENNA-DOCUSATE SODIUM 8.6-50 MG PO TABS
2.0000 | ORAL_TABLET | Freq: Two times a day (BID) | ORAL | Status: DC | PRN
  Filled 2011-12-11: qty 2

## 2011-12-11 MED ORDER — CINACALCET HCL 30 MG PO TABS
60.0000 mg | ORAL_TABLET | Freq: Every day | ORAL | Status: DC
  Administered 2011-12-11 – 2011-12-12 (×2): 60 mg via ORAL
  Filled 2011-12-11 (×2): qty 2

## 2011-12-11 MED ORDER — ROPINIROLE HCL 0.25 MG PO TABS
0.2500 mg | ORAL_TABLET | Freq: Three times a day (TID) | ORAL | Status: DC
  Filled 2011-12-11 (×2): qty 20

## 2011-12-11 MED ORDER — INSULIN ASPART 100 UNIT/ML ~~LOC~~ SOLN
0.0000 [IU] | Freq: Three times a day (TID) | SUBCUTANEOUS | Status: DC
  Administered 2011-12-11: 5 [IU] via SUBCUTANEOUS
  Administered 2011-12-12: 3 [IU] via SUBCUTANEOUS
  Administered 2011-12-12: 5 [IU] via SUBCUTANEOUS

## 2011-12-11 MED ORDER — SIMVASTATIN 20 MG PO TABS
20.0000 mg | ORAL_TABLET | Freq: Every day | ORAL | Status: DC
  Administered 2011-12-11: 20 mg via ORAL
  Filled 2011-12-11 (×2): qty 1

## 2011-12-11 MED ORDER — RENA-VITE PO TABS
1.0000 | ORAL_TABLET | Freq: Every day | ORAL | Status: DC
  Administered 2011-12-11: 1 via ORAL
  Filled 2011-12-11 (×2): qty 1

## 2011-12-11 MED ORDER — INSULIN GLARGINE 100 UNIT/ML ~~LOC~~ SOLN
50.0000 [IU] | Freq: Every day | SUBCUTANEOUS | Status: DC
  Administered 2011-12-11: 50 [IU] via SUBCUTANEOUS

## 2011-12-11 MED ORDER — CALCIUM ACETATE 667 MG PO CAPS
2866.0000 mg | ORAL_CAPSULE | Freq: Three times a day (TID) | ORAL | Status: DC
  Administered 2011-12-11 – 2011-12-12 (×3): 2668 mg via ORAL
  Filled 2011-12-11 (×5): qty 4

## 2011-12-11 NOTE — ED Notes (Signed)
Patient unable to stand to finish orthostatic vitals

## 2011-12-11 NOTE — ED Provider Notes (Signed)
History     CSN: [id number]  Arrival date & time 12/11/11  1235   First MD Initiated Contact with Patient 12/11/11 1247      Chief Complaint  Patient presents with  . Code STEMI    (Consider location/radiation/quality/duration/timing/severity/associated sxs/prior treatment) HPI Comments: Level 5 caveat due to severity of symptoms. Patient was at dialysis, per EMS unsure how much of his dialysis he was able to complete. Patient reports that he does sometimes get weak and lightheaded, has had episodes of vomiting with dialysis in the past. Usually he is able to go home and sleep to rest off. During dialysis he apparently passed out after vomiting. Reportedly nonbloody. Patient was given 400 cc of normal saline at the facility with some symptomatic improvement. EMS was called and the patient did not wish to come to the emergency department. They convinced him to at least have an EKG performed 2 to the syncopal event and they felt the patient was having an ST elevation MI. Patient denied having any chest or back pain. He reports that he is fatigued and tired but not in any pain. He no longer feels any nausea. A code STEMI was called prior to the patient's arrival. Patient reports he is unsure who his cardiologist is.  The history is provided by the patient, the EMS personnel and medical records.    Past Medical History  Diagnosis Date  . Renal insufficiency   . CHF (congestive heart failure)   . Diabetes mellitus   . Blind t    Past Surgical History  Procedure Date  . Appendectomy     Family History  Problem Relation Age of Onset  . Diabetes Mellitus II Father     History  Substance Use Topics  . Smoking status: Former Games developer  . Smokeless tobacco: Not on file  . Alcohol Use: No      Review of Systems  Unable to perform ROS: Other    Allergies  Cefepime and Penicillins  Home Medications   Current Outpatient Rx  Name  Route  Sig  Dispense  Refill  . ASPIRIN 325 MG  PO TABS   Oral   Take 325 mg by mouth daily.         Marland Kitchen CALCIUM ACETATE 667 MG PO CAPS   Oral   Take 4 capsules (2,668 mg total) by mouth 3 (three) times daily with meals.   360 capsule   0   . CINACALCET HCL 60 MG PO TABS   Oral   Take 60 mg by mouth daily.          Marland Kitchen DARBEPOETIN ALFA-POLYSORBATE 40 MCG/0.4ML IJ SOLN   Intravenous   Inject 0.4 mLs (40 mcg total) into the vein every Thursday with hemodialysis.   8.4 mL      . DIALYVITE 3000 3 MG PO TABS   Oral   Take 1 tablet by mouth daily.         . INSULIN ASPART 100 UNIT/ML [place] SOLN   Subcutaneous   Inject 20-40 Units into the skin 3 (three) times daily before meals. Sliding scale         . INSULIN GLARGINE 100 UNIT/ML [place] SOLN   Subcutaneous   Inject 50 Units into the skin at bedtime.   10 mL   0   . LEVOFLOXACIN 500 MG PO TABS   Oral   Take 1 tablet (500 mg total) by mouth every other day.   3 tablet   0   .  MIDODRINE HCL 5 MG PO TABS   Oral   Take 1 tablet (5 mg total) by mouth Every Tuesday,Thursday,and Saturday with dialysis.   12 tablet   0   . OMEPRAZOLE 20 MG PO CPDR   Oral   Take 20 mg by mouth 2 (two) times daily.          . OXYCODONE HCL 5 MG PO CAPS   Oral   Take 10 mg by mouth every 6 (six) hours as needed. For pain         . PRAVASTATIN SODIUM 40 MG PO TABS   Oral   Take 40 mg by mouth Nightly.         Marland Kitchen ROPINIROLE HCL 0.25 MG PO TABS   Oral   Take 0.25-5 mg by mouth 3 (three) times daily. 1 tablet twice a day and 2 tablets at bedtime         . SENNA-DOCUSATE SODIUM 8.6-50 MG PO TABS   Oral   Take 2 tablets by mouth 2 (two) times daily as needed. For constipation           BP 103/67  Pulse 90  Temp 97.2 F (36.2 C) (Oral)  Resp 17  Ht 5\' 11"  (1.803 m)  Wt 230 lb (104.327 kg)  BMI 32.08 kg/m2  SpO2 100%  Physical Exam  Nursing note and vitals reviewed. Constitutional: He appears well-developed and well-nourished. No distress.  HENT:  Head: Normocephalic  and atraumatic.  Eyes: No scleral icterus.  Neck: Normal range of motion.  Cardiovascular: Normal rate and regular rhythm.   No murmur heard. Pulmonary/Chest: Effort normal. No respiratory distress. He has no wheezes. He has no rales.  Abdominal: Soft. He exhibits no distension. There is no tenderness. There is no rebound.  Neurological: He is alert. No cranial nerve deficit.  Skin: Skin is warm. No rash noted. He is not diaphoretic. No pallor.  Psychiatric: He has a normal mood and affect.    ED Course  Procedures (including critical care time)  Labs Reviewed  CBC WITH DIFFERENTIAL - Abnormal; Notable for the following:    WBC 13.3 (*)     RBC 3.48 (*)     Hemoglobin 10.7 (*)     HCT 32.2 (*)     Neutrophils Relative 80 (*)     Neutro Abs 10.7 (*)     All other components within normal limits  BASIC METABOLIC PANEL - Abnormal; Notable for the following:    Chloride 94 (*)     Glucose, Bld 313 (*)     BUN 39 (*)     Creatinine, Ser 7.05 (*)     GFR calc non Af Amer 7 (*)     GFR calc Af Amer 8 (*)     All other components within normal limits  GLUCOSE, CAPILLARY - Abnormal; Notable for the following:    Glucose-Capillary 281 (*)     All other components within normal limits  HEPATIC FUNCTION PANEL - Abnormal; Notable for the following:    Total Bilirubin 0.2 (*)     All other components within normal limits  POCT I-STAT TROPONIN I  LIPASE, BLOOD  URINALYSIS, ROUTINE W REFLEX MICROSCOPIC   Dg Chest Port 1 View  12/11/2011  *RADIOLOGY REPORT*  Clinical Data: Chest pain, diabetes, end-stage renal disease on dialysis, CHF  PORTABLE CHEST - 1 VIEW  Comparison: Portable exam 1304 hours compared to 10/30/2011  Findings: Large bore dual lumen left jugular central venous  catheter, tip projecting over right atrium. Normal heart size, mediastinal contours, and pulmonary vascularity for technique. Lungs clear. No pleural effusion or pneumothorax. Tiny metallic foreign bodies right  chest again noted.  IMPRESSION: No acute abnormalities.   Original Report Authenticated By: Ulyses Southward, M.D.      1. Syncope   2. Weakness   3. Nausea and vomiting   4. End stage renal disease on dialysis     EKG performed at time 12:40, shows sinus rhythm at a rate of 77, nonspecific ST elevation in the inferior leads with no reciprocal changes. Axis is normal. Borderline AV conduction delay with a PR interval of 208 ms. No prior EKGs currently available for comparison.   2:26 PM Pt's basic labs are ok, first troponin is neg.  Pt is very orthostatic on exam here, despite IVF bolus given to him at facility.  Will need continued gentle hydration as he cannot stand or walk.  No focal deficits to suggest CVA.  Again no CP or SOB.    2:40 PM Pt can stand and transition to wheelchair normally, but here cannot stand at all.  Pt did have vomiting and syncope.  Will recommend observation admission and will discuss with [facility].    MDM   Patient's symptoms are currently atypical for ACS. He denies any chest pain, jaw pain, back pain. He did have an episode of weakness, vomiting and brief syncope during dialysis. Patient's symptoms seem resolved at this time. Given he has no chest pain, his nonspecific EKG does not equate to a ST elevation MI in my opinion. Code STEMI was canceled. My plan is to observe him, round serial troponins and recheck his electrolytes. If troponin serially are normal and the patient continues to improve clinically I feel he would be stable for discharge and can continue managing his chronic medical conditions as an outpatient.        Gavin Pound. Sanaia Jasso, MD 12/11/11 1442

## 2011-12-11 NOTE — ED Notes (Signed)
Pt reports "I'm not talking right" speech is slow with delayed response to questions.

## 2011-12-11 NOTE — ED Notes (Signed)
Hospitalist at bedside 

## 2011-12-11 NOTE — Consult Note (Signed)
Reason for Consult: Continuity of ESRD care Referring Physician: Dr. Theda Belfast Dhungel - [facility] service.   HPI:  66 year old Caucasian man with a history of ESRD on TTS schedule at [facility]. Completed his dialysis today and had a syncopal episode at the check-out scale. Also noted to have n episode of emesis without witness aspiration. Seen by EMS in the dialysis center and suspected to have a STEMI based on suspicious EKG- transported to [facility] ER. In ER, EKG reviewed and after initial set of enzymes found negative- Code STEMI cancelled. Admitted to the hospitalist service for ACS rule out.   Dialysis prescription: TTS, EDW 100kg, UF profile 4, 3hr , 2k, 2.5Ca, Left IJ Catheter, QB 400- QD AF 1.5, Epo 1400 TIW, Zemplar TIW, Heparin 9000u bolus at HD  Past Medical History  Diagnosis Date  . Renal insufficiency   . CHF (congestive heart failure)   . Diabetes mellitus   . Blind t    Past Surgical History  Procedure Date  . Appendectomy     Family History  Problem Relation Age of Onset  . Diabetes Mellitus II Father     Social History:  reports that he has quit smoking. He does not have any smokeless tobacco history on file. He reports that he does not drink alcohol or use illicit drugs.  Allergies:  Allergies  Allergen Reactions  . Cefepime Itching  . Penicillins Hives and Swelling    Medications:  Scheduled:   . [COMPLETED] ondansetron (ZOFRAN) IV  4 mg Intravenous Once    Results for orders placed during the hospital encounter of 12/11/11 (from the past 48 hour(s))  CBC WITH DIFFERENTIAL     Status: Abnormal   Collection Time   12/11/11 12:56 PM      Component Value Range Comment   WBC 13.3 (*) 4.0 - 10.5 K/uL    RBC 3.48 (*) 4.22 - 5.81 MIL/uL    Hemoglobin 10.7 (*) 13.0 - 17.0 g/dL    HCT 16.1 (*) 09.6 - 52.0 %    MCV 92.5  78.0 - 100.0 fL    MCH 30.7  26.0 - 34.0 pg    MCHC 33.2  30.0 - 36.0 g/dL    RDW 04.5  40.9 - 81.1 %    Platelets 266  150 - 400 K/uL    Neutrophils Relative 80 (*) 43 - 77 %    Neutro Abs 10.7 (*) 1.7 - 7.7 K/uL    Lymphocytes Relative 12  12 - 46 %    Lymphs Abs 1.5  0.7 - 4.0 K/uL    Monocytes Relative 6  3 - 12 %    Monocytes Absolute 0.8  0.1 - 1.0 K/uL    Eosinophils Relative 2  0 - 5 %    Eosinophils Absolute 0.3  0.0 - 0.7 K/uL    Basophils Relative 1  0 - 1 %    Basophils Absolute 0.1  0.0 - 0.1 K/uL   BASIC METABOLIC PANEL     Status: Abnormal   Collection Time   12/11/11 12:56 PM      Component Value Range Comment   Sodium 136  135 - 145 mEq/L    Potassium 4.4  3.5 - 5.1 mEq/L    Chloride 94 (*) 96 - 112 mEq/L    CO2 27  19 - 32 mEq/L    Glucose, Bld 313 (*) 70 - 99 mg/dL    BUN 39 (*) 6 - 23  mg/dL    Creatinine, Ser 4.78 (*) 0.50 - 1.35 mg/dL    Calcium 9.6  8.4 - 29.5 mg/dL    GFR calc non Af Amer 7 (*) >90 mL/min    GFR calc Af Amer 8 (*) >90 mL/min   HEPATIC FUNCTION PANEL     Status: Abnormal   Collection Time   12/11/11 12:56 PM      Component Value Range Comment   Total Protein 7.7  6.0 - 8.3 g/dL    Albumin 3.7  3.5 - 5.2 g/dL    AST 13  0 - 37 U/L    ALT 14  0 - 53 U/L    Alkaline Phosphatase 94  39 - 117 U/L    Total Bilirubin 0.2 (*) 0.3 - 1.2 mg/dL    Bilirubin, Direct <6.2  0.0 - 0.3 mg/dL    Indirect Bilirubin NOT CALCULATED  0.3 - 0.9 mg/dL   LIPASE, BLOOD     Status: Normal   Collection Time   12/11/11 12:56 PM      Component Value Range Comment   Lipase 52  11 - 59 U/L   POCT I-STAT TROPONIN I     Status: Normal   Collection Time   12/11/11  1:12 PM      Component Value Range Comment   Troponin i, poc 0.04  0.00 - 0.08 ng/mL    Comment 3            GLUCOSE, CAPILLARY     Status: Abnormal   Collection Time   12/11/11  1:12 PM      Component Value Range Comment   Glucose-Capillary 281 (*) 70 - 99 mg/dL     Dg Chest Port 1 View  12/11/2011  *RADIOLOGY REPORT*  Clinical Data: Chest pain, diabetes, end-stage renal disease on dialysis, CHF   PORTABLE CHEST - 1 VIEW  Comparison: Portable exam 1304 hours compared to 10/30/2011  Findings: Large bore dual lumen left jugular central venous catheter, tip projecting over right atrium. Normal heart size, mediastinal contours, and pulmonary vascularity for technique. Lungs clear. No pleural effusion or pneumothorax. Tiny metallic foreign bodies right chest again noted.  IMPRESSION: No acute abnormalities.   Original Report Authenticated By: Ulyses Southward, M.D.     Review of Systems  Constitutional: Negative for fever, chills, weight loss, malaise/fatigue and diaphoresis.  HENT: Negative for hearing loss, ear pain, nosebleeds, congestion, sore throat, neck pain, tinnitus and ear discharge.   Eyes: Negative for blurred vision, photophobia, pain and redness.  Respiratory: Negative for cough, hemoptysis, sputum production, shortness of breath, wheezing and stridor.   Cardiovascular: Negative for chest pain, palpitations, orthopnea, claudication, leg swelling and PND.  Gastrointestinal: Negative for heartburn, nausea, vomiting, abdominal pain, diarrhea, constipation, blood in stool and melena.  Genitourinary: Negative for dysuria, urgency, frequency, hematuria and flank pain.  Musculoskeletal: Negative for myalgias, back pain, joint pain and falls.  Skin: Negative.   Neurological: Positive for dizziness and speech change. Negative for tingling, tremors, sensory change, focal weakness, seizures, loss of consciousness, weakness and headaches.  Endo/Heme/Allergies: Negative for environmental allergies and polydipsia. Does not bruise/bleed easily.  Psychiatric/Behavioral: Negative for depression, suicidal ideas, hallucinations, memory loss and substance abuse. The patient is not nervous/anxious and does not have insomnia.   All other systems reviewed and are negative.   Blood pressure 129/62, pulse 81, temperature 97.7 F (36.5 C), temperature source Oral, resp. rate 10, height 5\' 11"  (1.803 m), weight  104.327 kg (230 lb),  SpO2 91.00%. Physical Exam  Nursing note and vitals reviewed. General= Bearded Elderly Caucasian Male in ER ,NAD , Appropriate HEENT= [place] MMM, EOMI, NO carotid bruits heard, neck supple L:ungs= at bases slightly decreased BS otherwise CTA , no rales or wheezing Card= RRR, no rub or Murmur ,or gallop  Abd= BS +=, obese, soft, non tender EXt= No pedal edema Hemodialysis access= Left IJ perm Cath, non tender and no DC Skin= No rash or overt ulcer  Noted Neuro = OX 3, moves all ext, no gross deficicts   Assessment/Plan: 1. Syncopal episode: being admitted for ACS rule out and telemetry overnight. Suspect by have been a vasovagal event but need to rule out ACS given extensive CAD/PAD history. Some dysarthria reported by the patient but no focal neurological symptoms noted. 2.ESRD: completed full HD today- plan next elective HD Thursday. He is prone to large fluid gains especially over the weekend. 3. Anemia: resume ESA, he denies any overt losses 4. DM: Continue glycemic control per out-patient schedule 5. CKD-MBD: resume binders and VDRA/sensipar 6. Hypotension: Chronic problem and amplified by fluid shifts on HD- for this, he takes midodrine 5mg  PO TIW pre-HD. Will increase this to 10mg  PO TIW prior to HD (to limit intra-HD drops)  Harlan Ervine W 12/11/2011, 4:08 PM

## 2011-12-11 NOTE — ED Notes (Signed)
Report given to [place]berg, RN in CDU. Pt to go to CDU 11.

## 2011-12-11 NOTE — ED Notes (Signed)
Code STEMI cancelled. 

## 2011-12-11 NOTE — Consult Note (Signed)
Reason for Consult: hypertension/syncope Referring Physician:   [name] is an 66 y.o. male.  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  His primary cardiologist is in the [place] system. Patient had a left heart catheterization in August of 2011 showed normal coronary arteries with ejection fraction of 45%. Inferior wall motion abnormality with marked hypokinesis.  2-D echocardiogram in August of 2011 showed an ejection fraction of 55-60% with mild concentric hypertrophy wall motion was normal. Segments of the apex and midanterior wall were poorly seen. He did have grade 2 diastolic dysfunction. Mild mitral valve regurgitation with mildly thickened leaflets. The left atrium was moderately dilated.  Patient was at hemodialysis today and became hypotensive and apparently had syncopal episode. Patient states he vomited and thinks it was done after he became conscious.  He also reports vomiting Thanksgiving night. Other than that, he has had no illness prior to today.  He does complain of shortness of breath, dyspnea on exertion, dizziness as well.   No current nausea, fever, chest pain, orthopnea, PND, lower extremity edema, diarrhea, hematochezia.   Patient was initially thought to be a STEMI by EMS. Patient was seen initially by Dr. Herbie Baltimore canceled the STEMI. EKG shows a possible early repolarization inferior leads. A first degree AV block. He said that 2 troponin POCs both of which are negative.  Past Medical History  Diagnosis Date  . Renal insufficiency   . CHF (congestive heart failure)   . Diabetes mellitus   . Blind t    Past Surgical History  Procedure Date  . Appendectomy     Family History  Problem Relation Age of Onset  . Diabetes Mellitus II Father     Social History:  reports that he has quit smoking. He does not have  any smokeless tobacco history on file. He reports that he does not drink alcohol or use illicit drugs.  Allergies:  Allergies  Allergen Reactions  . Cefepime Itching  . Penicillins Hives and Swelling    Medications:     . aspirin  325 mg Oral Daily  . calcium acetate  2,668 mg Oral TID WC  . cinacalcet  60 mg Oral Daily  . heparin  5,000 Units Subcutaneous Q8H  . insulin aspart  0-15 Units Subcutaneous TID WC  . insulin glargine  50 Units Subcutaneous QHS  . midodrine  5 mg Oral Q T,Th,Sa-HD  . multivitamin  1 tablet Oral QHS  . [COMPLETED] ondansetron (ZOFRAN) IV  4 mg Intravenous Once  . pantoprazole  40 mg Oral Daily  . rOPINIRole  0.25 mg Oral Custom  . rOPINIRole  0.5 mg Oral QHS  . simvastatin  20 mg Oral q1800  . sodium chloride  3 mL Intravenous Q12H  . [DISCONTINUED] b complex-vitamin c-folic acid  1 tablet Oral Daily  . [DISCONTINUED] rOPINIRole  0.25-5 mg Oral TID     Results for orders placed during the hospital encounter of 12/11/11 (from the past 48 hour(s))  CBC WITH DIFFERENTIAL     Status: Abnormal   Collection Time   12/11/11 12:56 PM      Component Value Range Comment   WBC 13.3 (*) 4.0 - 10.5 K/uL    RBC 3.48 (*) 4.22 - 5.81 MIL/uL    Hemoglobin 10.7 (*) 13.0 - 17.0 g/dL    HCT 40.9 (*)  39.0 - 52.0 %    MCV 92.5  78.0 - 100.0 fL    MCH 30.7  26.0 - 34.0 pg    MCHC 33.2  30.0 - 36.0 g/dL    RDW 30.8  65.7 - 84.6 %    Platelets 266  150 - 400 K/uL    Neutrophils Relative 80 (*) 43 - 77 %    Neutro Abs 10.7 (*) 1.7 - 7.7 K/uL    Lymphocytes Relative 12  12 - 46 %    Lymphs Abs 1.5  0.7 - 4.0 K/uL    Monocytes Relative 6  3 - 12 %    Monocytes Absolute 0.8  0.1 - 1.0 K/uL    Eosinophils Relative 2  0 - 5 %    Eosinophils Absolute 0.3  0.0 - 0.7 K/uL    Basophils Relative 1  0 - 1 %    Basophils Absolute 0.1  0.0 - 0.1 K/uL   BASIC METABOLIC PANEL     Status: Abnormal   Collection Time   12/11/11 12:56 PM      Component Value Range Comment    Sodium 136  135 - 145 mEq/L    Potassium 4.4  3.5 - 5.1 mEq/L    Chloride 94 (*) 96 - 112 mEq/L    CO2 27  19 - 32 mEq/L    Glucose, Bld 313 (*) 70 - 99 mg/dL    BUN 39 (*) 6 - 23 mg/dL    Creatinine, Ser 9.62 (*) 0.50 - 1.35 mg/dL    Calcium 9.6  8.4 - 95.2 mg/dL    GFR calc non Af Amer 7 (*) >90 mL/min    GFR calc Af Amer 8 (*) >90 mL/min   HEPATIC FUNCTION PANEL     Status: Abnormal   Collection Time   12/11/11 12:56 PM      Component Value Range Comment   Total Protein 7.7  6.0 - 8.3 g/dL    Albumin 3.7  3.5 - 5.2 g/dL    AST 13  0 - 37 U/L    ALT 14  0 - 53 U/L    Alkaline Phosphatase 94  39 - 117 U/L    Total Bilirubin 0.2 (*) 0.3 - 1.2 mg/dL    Bilirubin, Direct <8.4  0.0 - 0.3 mg/dL    Indirect Bilirubin NOT CALCULATED  0.3 - 0.9 mg/dL   LIPASE, BLOOD     Status: Normal   Collection Time   12/11/11 12:56 PM      Component Value Range Comment   Lipase 52  11 - 59 U/L   POCT I-STAT TROPONIN I     Status: Normal   Collection Time   12/11/11  1:12 PM      Component Value Range Comment   Troponin i, poc 0.04  0.00 - 0.08 ng/mL    Comment 3            GLUCOSE, CAPILLARY     Status: Abnormal   Collection Time   12/11/11  1:12 PM      Component Value Range Comment   Glucose-Capillary 281 (*) 70 - 99 mg/dL   POCT I-STAT TROPONIN I     Status: Normal   Collection Time   12/11/11  4:07 PM      Component Value Range Comment   Troponin i, poc 0.02  0.00 - 0.08 ng/mL    Comment 3  GLUCOSE, CAPILLARY     Status: Abnormal   Collection Time   12/11/11  5:03 PM      Component Value Range Comment   Glucose-Capillary 226 (*) 70 - 99 mg/dL     Dg Chest Port 1 View  12/11/2011  *RADIOLOGY REPORT*  Clinical Data: Chest pain, diabetes, end-stage renal disease on dialysis, CHF  PORTABLE CHEST - 1 VIEW  Comparison: Portable exam 1304 hours compared to 10/30/2011  Findings: Large bore dual lumen left jugular central venous catheter, tip projecting over right atrium. Normal  heart size, mediastinal contours, and pulmonary vascularity for technique. Lungs clear. No pleural effusion or pneumothorax. Tiny metallic foreign bodies right chest again noted.  IMPRESSION: No acute abnormalities.   Original Report Authenticated By: Ulyses Southward, M.D.     Review of Systems  Constitutional: Negative for fever and diaphoresis.  HENT: Negative for congestion.   Eyes:       Blind  Respiratory: Positive for shortness of breath.        +DOE  Cardiovascular: Negative for chest pain, orthopnea, leg swelling and PND.  Gastrointestinal: Positive for vomiting and constipation. Negative for nausea, abdominal pain, diarrhea and blood in stool.  Musculoskeletal: Positive for myalgias (Right Shoulder pain from fall prior to Thanksgiving).  Neurological: Positive for dizziness.   Blood pressure 101/56, pulse 84, temperature 98.1 F (36.7 C), temperature source Oral, resp. rate 18, height 5\' 11"  (1.803 m), weight 101.606 kg (224 lb), SpO2 91.00%. Physical Exam  Constitutional: He is oriented to person, place, and time. He appears well-developed and well-nourished. No distress.  HENT:  Head: Normocephalic and atraumatic.  Eyes: EOM are normal. No scleral icterus.  Neck: Normal range of motion. Neck supple. No JVD present.  Cardiovascular: Normal rate, regular rhythm, S1 normal and S2 normal.   No murmur heard. Pulses:      Radial pulses are 0 on the right side, and 1+ on the left side.       Dorsalis pedis pulses are 2+ on the right side, and 2+ on the left side.       No Carotid Bruit  Respiratory: Effort normal and breath sounds normal. He has no wheezes. He has no rales.  GI: Soft. Bowel sounds are normal. He exhibits no distension. There is no tenderness. There is no guarding.  Musculoskeletal: He exhibits no edema.       Right shoulder is tender to palpation near [facility] joint.  No ecchymosis.  Lymphadenopathy:    He has no cervical adenopathy.  Neurological: He is alert and  oriented to person, place, and time.  Skin: Skin is warm and dry.  Psychiatric: He has a normal mood and affect.    Assessment/Plan: Patient Active Hospital Problem List: Blind () Hypotension (10/30/2011) ESRD (end stage renal disease) on dialysis (10/30/2011) DM2 (diabetes mellitus, type 2) (10/30/2011) Cardiomyopathy (10/30/2011) Anemia (10/30/2011) Syncope?  Plan:  Continue to monitor BP and HR.  Check 2D Echo.     HAGER, BRYAN 12/11/2011, 5:14 PM      Agree with note written by Jones Sk[facility]  Doubt syncope. Suspect LOC was secondary to hypotension +/- hypoglycemia. Exam benign. Pt otherwise asymptomatic. No further w/u necessary. Can be D/Cd home from our standpoint with OP follow up.  Runell Gess 12/12/2011 5:57 AM

## 2011-12-11 NOTE — ED Notes (Signed)
Pt brought in by [facility] from HD center. Pt became hypotensive, lethargic with n/v. Pt given fluid bolus by HD center. Pt denies CP. Code STEMI called by EMS for abnormal EKG. 324mg  BASA given, 20g Lt AC with NS started by EMS. No SL nitro was given due to BP. Cath lab and cardiologist in room at time of pt arrival to ED

## 2011-12-11 NOTE — H&P (Signed)
[facility] History and Physical  [name] NWG:[id number] DOB: 08/30/1945 DOA: 12/11/2011  Referring physician: Dr Oletta Lamas PCP: Lizbeth Bark, FNP   Chief Complaint:  Sent from dialysis for syncope and orthostatic hypotension  HPI:  66 year old male with end-stage renal disease on dialysis (Tuesdays Thursdays and Saturdays) , cardiomyopathy with EF of 45% on last cath in 2011, (seen by Dr. little with [facility] and follows mainly at  [facility]), diabetes mellitus on insulin, legally blind who was sent from dialysis after having a brief syncopal episode today. Patient informs that on completion of dialysis when he was being transferred to the wheelchair he syncopized and does not recall the incident. Family at bedside mentioned that he had an episode of vomiting after dialysis and that his blood pressure dropped. An orthostatic BP checked at the dialysis center was positive. An EKG done there by EMS showed ST elevation in the inferior leads and patient was given full dose aspirin , IV fluids and was transferred to [facility] cone. Patient denies any chest pain, palpitations, shortness of breath, fever, headache, dizziness, abdominal pain, diarrhea. He denies any weakness. At baseline he is nonambulatory and uses a wheelchair. Her orthostatic vital was attempted in the ED and patient could not get a properly for that. Of note patient was admitted in October for hypotension likely related post dialysis and pneumonia.  Review of Systems:  Constitutional: Denies fever, chills, diaphoresis, appetite change and fatigue present.  HEENT: Denies photophobia, eye pain, redness, hearing loss, ear pain, congestion, sore throat, rhinorrhea, sneezing, mouth sores, trouble swallowing, neck pain, neck stiffness and tinnitus.   Respiratory: Denies SOB, DOE, cough, chest tightness,  and wheezing.   Cardiovascular: Denies chest pain, palpitations and leg swelling.  Gastrointestinal: Denies nausea, vomiting,  abdominal pain, diarrhea, constipation, blood in stool and abdominal distention.  Genitourinary: Denies dysuria, urgency, frequency, hematuria, flank pain and difficulty urinating.  Musculoskeletal: Denies myalgias, back pain, joint swelling, arthralgias and gait problem.  Skin: Denies pallor, rash and wound.  Neurological: Positive for syncope and some dizziness.  Denies  seizures, syncope, weakness, light-headedness, numbness and headaches.  Hematological: Denies adenopathy. Easy bruising, personal or family bleeding history  Psychiatric/Behavioral: Denies suicidal ideation, mood changes, confusion, nervousness, sleep disturbance and agitation   Past Medical History  Diagnosis Date  . Renal insufficiency   . CHF (congestive heart failure)   . Diabetes mellitus   . Blind t   Past Surgical History  Procedure Date  . Appendectomy    Social History:  reports that he has quit smoking. He does not have any smokeless tobacco history on file. He reports that he does not drink alcohol or use illicit drugs.  Allergies  Allergen Reactions  . Cefepime Itching  . Penicillins Hives and Swelling    Family History  Problem Relation Age of Onset  . Diabetes Mellitus II Father     Prior to Admission medications   Medication Sig Start Date End Date Taking? Authorizing Provider  aspirin 325 MG tablet Take 325 mg by mouth daily.    Historical Provider, MD  calcium acetate (PHOSLO) 667 MG capsule Take 4 capsules (2,668 mg total) by mouth 3 (three) times daily with meals. 11/04/11   Joseph Art, DO  cinacalcet (SENSIPAR) 60 MG tablet Take 60 mg by mouth daily.     Historical Provider, MD  darbepoetin (ARANESP) 40 MCG/0.4ML SOLN Inject 0.4 mLs (40 mcg total) into the vein every Thursday with hemodialysis. 11/04/11   Selinda Orion  Vann, DO  folic acid-vitamin b complex-vitamin c-selenium-zinc (DIALYVITE) 3 MG TABS Take 1 tablet by mouth daily.    Historical Provider, MD  insulin aspart (NOVOLOG) 100  UNIT/ML injection Inject 20-40 Units into the skin 3 (three) times daily before meals. Sliding scale    Historical Provider, MD  insulin glargine (LANTUS) 100 UNIT/ML injection Inject 50 Units into the skin at bedtime. 11/04/11   Joseph Art, DO  levofloxacin (LEVAQUIN) 500 MG tablet Take 1 tablet (500 mg total) by mouth every other day. 11/04/11   Joseph Art, DO  midodrine (PROAMATINE) 5 MG tablet Take 1 tablet (5 mg total) by mouth Every Tuesday,Thursday,and Saturday with dialysis. 11/04/11   Joseph Art, DO  omeprazole (PRILOSEC) 20 MG capsule Take 20 mg by mouth 2 (two) times daily.     Historical Provider, MD  oxycodone (OXY-IR) 5 MG capsule Take 10 mg by mouth every 6 (six) hours as needed. For pain    Historical Provider, MD  pravastatin (PRAVACHOL) 40 MG tablet Take 40 mg by mouth Nightly.    Historical Provider, MD  rOPINIRole (REQUIP) 0.25 MG tablet Take 0.25-5 mg by mouth 3 (three) times daily. 1 tablet twice a day and 2 tablets at bedtime    Historical Provider, MD  sennosides-docusate sodium (SENOKOT-S) 8.6-50 MG tablet Take 2 tablets by mouth 2 (two) times daily as needed. For constipation    Historical Provider, MD    Physical Exam:  Filed Vitals:   12/11/11 1430 12/11/11 1445 12/11/11 1500 12/11/11 1545  BP: 157/81 126/66 141/72 129/62  Pulse: 78 78 83 81  Temp:    97.7 F (36.5 C)  TempSrc:    Oral  Resp: 20 17 17 10   Height:      Weight:      SpO2: 100% 100% 100% 91%    Constitutional: Vital signs reviewed.  Patient is  an elderly obese male lying in bed in no acute distress Head: Normocephalic and atraumatic Ear: TM normal bilaterally Mouth: no erythema or exudates, MMM Eyes: PERRL, EOMI, conjunctivae normal, No scleral icterus.  legally blind  Neck: Supple, Trachea midline normal ROM, No JVD, mass, thyromegaly, or carotid bruit present.  Cardiovascular: RRR, S1 normal, S2 normal, no MRG, pulses symmetric and intact bilaterally Pulmonary/Chest: CTAB, no  wheezes, rales, or rhonchi Abdominal: Soft. Non-tender, non-distended, bowel sounds are normal, no masses, organomegaly, or guarding present.  GU: no CVA tenderness Musculoskeletal: No joint deformities, erythema, or stiffness, ROM full and no nontender Ext: no edema and no cyanosis, pulses palpable bilaterally (DP and PT) Hematology: no cervical, inginal, or axillary adenopathy.  Neurological: A&O x3, Strenght is normal and symmetric bilaterally, cranial nerve II-XII are grossly intact, no focal motor deficit, sensory intact to light touch bilaterally.  Skin: Warm, dry and intact. No rash, cyanosis, or clubbing.  Psychiatric: Normal mood and affect. speech and behavior is normal. Judgment and thought content normal. Cognition and memory are normal.   Labs on Admission:  Basic Metabolic Panel:  Lab 12/11/11 3235  NA 136  K 4.4  CL 94*  CO2 27  GLUCOSE 313*  BUN 39*  CREATININE 7.05*  CALCIUM 9.6  MG --  PHOS --   Liver Function Tests:  Lab 12/11/11 1256  AST 13  ALT 14  ALKPHOS 94  BILITOT 0.2*  PROT 7.7  ALBUMIN 3.7    Lab 12/11/11 1256  LIPASE 52  AMYLASE --   No results found for this basename: AMMONIA:5 in the  last 168 hours CBC:  Lab 12/11/11 1256  WBC 13.3*  NEUTROABS 10.7*  HGB 10.7*  HCT 32.2*  MCV 92.5  PLT 266   Cardiac Enzymes: No results found for this basename: CKTOTAL:5,CKMB:5,CKMBINDEX:5,TROPONINI:5 in the last 168 hours BNP: No components found with this basename: POCBNP:5 CBG:  Lab 12/11/11 1312  GLUCAP 281*    Radiological Exams on Admission: Dg Chest Port 1 View  12/11/2011  *RADIOLOGY REPORT*  Clinical Data: Chest pain, diabetes, end-stage renal disease on dialysis, CHF  PORTABLE CHEST - 1 VIEW  Comparison: Portable exam 1304 hours compared to 10/30/2011  Findings: Large bore dual lumen left jugular central venous catheter, tip projecting over right atrium. Normal heart size, mediastinal contours, and pulmonary vascularity for  technique. Lungs clear. No pleural effusion or pneumothorax. Tiny metallic foreign bodies right chest again noted.  IMPRESSION: No acute abnormalities.   Original Report Authenticated By: Ulyses Southward, M.D.     EKG: Normal sinus rhythm at 77, nonspecific ST elevation in inferior leads, ROM to the [facility] 493  Assessment/Plan Active Problems:  Syncope with Orthostatic hypotension Likely related to post dialysis. Patient was given IV normal saline with improvement in his blood pressure. Not have records of his orthostatic vitals. We'll recheck  Again. Continue bedrest with fall precautions. Given history of vomiting and syncope with no clear record of a stasis will check a head CT to rule out a cerebral vascular event.  Will get PT eval in  morning. -patient not on any  blood pressure medications. -Need to discuss with renal to plan on minimizing his fluid removal during dialysis. Continue with home dose of midodrine.  EKG changes We'll monitor in telemetry with serial EKG and cardiac enzymes. Given history of ESRD , . cardiomyopathy and and diabetes we'll need to rule out for ACS. Continue aspirin and statin. [facility] has been informed and will see patient.    ESRD (end stage renal disease) on dialysis Patient completed hemodialysis today. I have informed his nephrologist Dr. Allena Katz.   DM2 (diabetes mellitus, type 2) Continue home dose Lantus. Continue sliding scale insulin   Cardiomyopathy Last EF of 45% per cardiac cath. [facility] will be following  Diet: diabetic   Code Status: Full Family Communication: Family atbedside Disposition Plan: Home once stable  [place] [facility] Pager [phone]  If 7PM-7AM, please contact night-coverage [email or web address] Password [email or web address] 12/11/2011, 4:05 PM    Total time spent : 70 minutes

## 2011-12-11 NOTE — ED Notes (Signed)
Family at bedside. 

## 2011-12-12 ENCOUNTER — Encounter (HOSPITAL_COMMUNITY): Payer: Self-pay | Admitting: *Deleted

## 2011-12-12 DIAGNOSIS — R55 Syncope and collapse: Secondary | ICD-10-CM | POA: Diagnosis present

## 2011-12-12 LAB — GLUCOSE, CAPILLARY
Glucose-Capillary: 178 mg/dL — ABNORMAL HIGH (ref 70–99)
Glucose-Capillary: 217 mg/dL — ABNORMAL HIGH (ref 70–99)

## 2011-12-12 LAB — BASIC METABOLIC PANEL
Calcium: 9.5 mg/dL (ref 8.4–10.5)
GFR calc Af Amer: 7 mL/min — ABNORMAL LOW (ref >90)
GFR calc non Af Amer: 6 mL/min — ABNORMAL LOW (ref >90)
Sodium: 137 mEq/L (ref 135–145)

## 2011-12-12 LAB — TROPONIN I
Troponin I: <0.3 ng/mL (ref <0.30)
Troponin I: <0.3 ng/mL (ref <0.30)

## 2011-12-12 LAB — CBC
Platelets: 270 10*3/uL (ref 150–400)
RBC: 3.34 MIL/uL — ABNORMAL LOW (ref 4.22–5.81)
WBC: 15.7 10*3/uL — ABNORMAL HIGH (ref 4.0–10.5)

## 2011-12-12 MED ORDER — BIOTENE DRY MOUTH MT LIQD
15.0000 mL | Freq: Two times a day (BID) | OROMUCOSAL | Status: DC
  Administered 2011-12-12: 15 mL via OROMUCOSAL

## 2011-12-12 MED ORDER — MIDODRINE HCL 10 MG PO TABS: 10.0000 mg | ORAL_TABLET | ORAL | Status: DC

## 2011-12-12 MED ORDER — MIDODRINE HCL 5 MG PO TABS: 10.0000 mg | ORAL_TABLET | ORAL | Status: DC

## 2011-12-12 MED ORDER — RENA-VITE PO TABS: 1.0000 | ORAL_TABLET | Freq: Every day | ORAL | Status: DC

## 2011-12-12 NOTE — Progress Notes (Signed)
Utilization review completed.  

## 2011-12-12 NOTE — Discharge Summary (Signed)
Physician Discharge Summary  ERBY [name] [name]:[id number] DOB: Feb 22, 1945 DOA: 12/11/2011  PCP: Lizbeth Bark, FNP  Admit date: 12/11/2011 Discharge date: 12/12/2011  Time spent: 40 minutes  Recommendations for Outpatient Follow-up:  1. Pt medically stable and ready for discharge to home. Will resume HD schedule.  Discharge Diagnoses:  Principal Problem:  *Syncope Active Problems:  Blind  Hypotension  ESRD (end stage renal disease) on dialysis  DM2 (diabetes mellitus, type 2)  Cardiomyopathy  Anemia   Discharge Condition: medically stable and ready for discharge to home with family  Diet recommendation: carb modified heart healthy  Filed Weights   12/11/11 1259 12/11/11 1707 12/12/11 0400  Weight: 104.327 kg (230 lb) 101.606 kg (224 lb) 102.513 kg (226 lb)    History of present illness:  66 year old male with end-stage renal disease on dialysis (Tuesdays Thursdays and Saturdays) , cardiomyopathy with EF of 45% on last cath in 2011, (seen by Dr. little with [facility] and follows mainly at [facility]), diabetes mellitus on insulin, legally blind who was sent from dialysis on 12/11/11 after having a brief syncopal episode. Patient reported that on completion of dialysis when he was being transferred to the wheelchair he syncopized but did not recall the incident. Family at bedside mentioned that he had an episode of vomiting after dialysis and that his blood pressure dropped. An orthostatic BP checked at the dialysis center was positive. An EKG done there by EMS showed ST elevation in the inferior leads and patient was given full dose aspirin , IV fluids and was transferred to [facility].  Patient denied any chest pain, palpitations, shortness of breath, fever, headache, dizziness, abdominal pain, diarrhea. He deniedany weakness. At baseline he is nonambulatory and uses a wheelchair. Othostatic vital  attempted in the ED and patient could not sit up properly for that. Of note patient was  admitted in October for hypotension likely related post dialysis and pneumonia.      Hospital Course:  Syncope  Likely vasovagal in setting of post dialysis. Patient was given IV normal saline in ED with improvement in his blood pressure. Admitted to tele. Has a hx orthostatic VS. Pt is wheelchair bound.  Head CT to rule out a cerebral vascular even negativet. CE neg. No events on tele.  Pt has a hx of hypotensive episodes and therefore is not on any blood pressure medications. Renal consulted and midodrine dose increased. At discharge BP 130/85. Not orthostatic at discharge.  EKG changes  Seen by cards. CE neg, tele without events. ACS ruled out. Recommend considering OP echo. Has appointment with PCP at [facility] on Friday.  Continue aspirin and statin.  [facility] has been informed and will see patient.  ESRD (end stage renal disease) on dialysis  Patient completed hemodialysis before admission. Seen by nephrology who opined that syncope likely related to vasovagal event post dialysis. Will continue dialysis in [place]. Increased midodrine to 10mg  per renal.  DM2 (diabetes mellitus, type 2)   Uncontrolled. A1c 7.3. CBG range 132-309. Continue home dose Lantus. Continue sliding scale insulin from home. Recommend OP DM teaching to optimize control.  Cardiomyopathy  Last EF of 45% per cardiac cath. Seen by cards. ACS ruled out. Recommend OP echo. [facility] Has [facility] next week.        Procedures:    Consultations:  Cards  renal  Discharge Exam: Filed Vitals:   12/11/11 2357 12/12/11 0400 12/12/11 0800 12/12/11 1200  BP: 101/71 112/51 109/65 130/85  Pulse: 85  84 85 90  Temp:  98.3 F (36.8 C) 98 F (36.7 C) 97.9 F (36.6 C)  TempSrc:  Oral    Resp:   18 18  Height:      Weight:  102.513 kg (226 lb)    SpO2:  100% 92% 99%    General: awake alert oriented x3 Cardiovascular: RRR No MGR trace LEE Respiratory: normal effort. BS distant and somewhat coarse. No  rales. No wheeze  Discharge Instructions  Discharge Orders    Future Orders Please Complete By Expires   Diet - low sodium heart healthy      Increase activity slowly      Call MD for:  persistant nausea and vomiting      Call MD for:  difficulty breathing, headache or visual disturbances          Medication List     As of 12/12/2011 12:59 PM    TAKE these medications         aspirin 325 MG tablet   Take 325 mg by mouth daily.      calcium acetate 667 MG capsule   Commonly known as: PHOSLO   Take 4 capsules (2,668 mg total) by mouth 3 (three) times daily with meals.      cinacalcet 60 MG tablet   Commonly known as: SENSIPAR   Take 60 mg by mouth daily.      darbepoetin 40 MCG/0.4ML Soln   Commonly known as: ARANESP   Inject 0.4 mLs (40 mcg total) into the vein every Thursday with hemodialysis.      folic acid-vitamin b complex-vitamin c-selenium-zinc 3 MG Tabs   Take 1 tablet by mouth daily.      insulin aspart 100 UNIT/ML injection   Commonly known as: novoLOG   Inject 20-40 Units into the skin 3 (three) times daily before meals. Sliding scale      insulin glargine 100 UNIT/ML injection   Commonly known as: LANTUS   Inject 50 Units into the skin at bedtime.      midodrine 10 MG tablet   Commonly known as: PROAMATINE   Take 1 tablet (10 mg total) by mouth Every Tuesday,Thursday,and Saturday with dialysis.      multivitamin Tabs tablet   Take 1 tablet by mouth at bedtime.      omeprazole 20 MG capsule   Commonly known as: PRILOSEC   Take 20 mg by mouth 2 (two) times daily.      oxycodone 5 MG capsule   Commonly known as: OXY-IR   Take 10 mg by mouth every 6 (six) hours as needed. For pain      pravastatin 40 MG tablet   Commonly known as: PRAVACHOL   Take 40 mg by mouth Nightly.      rOPINIRole 0.25 MG tablet   Commonly known as: REQUIP   Take 0.25-0.5 mg by mouth 3 (three) times daily. 1 tablet twice a day and 2 tablets at bedtime.       sennosides-docusate sodium 8.6-50 MG tablet   Commonly known as: SENOKOT-S   Take 2 tablets by mouth 2 (two) times daily as needed. For constipation      vitamin B-12 100 MCG tablet   Commonly known as: CYANOCOBALAMIN   Take 500 mcg by mouth 2 (two) times daily.           Follow-up Information    Follow up with Lizbeth Bark, FNP. Schedule an appointment as soon as possible for a visit  in 1 week.   Contact information:   [address] [place] [address] [phone]           The results of significant diagnostics from this hospitalization (including imaging, microbiology, ancillary and laboratory) are listed below for reference.    Significant Diagnostic Studies: Ct Head Wo Contrast  12/11/2011  *RADIOLOGY REPORT*  Clinical Data: Syncope.  Hypotension.  Lethargy.  Nausea and vomiting.  CT HEAD WITHOUT CONTRAST  Technique:  Contiguous axial images were obtained from the base of the skull through the vertex without contrast.  Comparison: 06/21/2010  Findings: Diffuse cerebral atrophy.  Low attenuation change throughout the deep and.  There are white matter consistent small vessel ischemia.  Ventricular dilatation consistent with central atrophy.  No mass effect or midline shift.  No abnormal extra-axial fluid collections.  Gray-white matter junctions are distinct. Basal cisterns are not effaced. No acute intracranial hemorrhage. No significant change in appearance of intracranial contents since previous study. No depressed skull fractures.  Visualized paranasal sinuses and mastoid air cells are not opacified.  Vascular calcifications.  IMPRESSION: No acute intracranial abnormalities.  Chronic atrophy and small vessel ischemic changes.   Original Report Authenticated By: Burman Nieves, M.D.    Dg Chest Port 1 View  12/11/2011  *RADIOLOGY REPORT*  Clinical Data: Chest pain, diabetes, end-stage renal disease on dialysis, CHF  PORTABLE CHEST - 1 VIEW  Comparison: Portable exam  1304 hours compared to 10/30/2011  Findings: Large bore dual lumen left jugular central venous catheter, tip projecting over right atrium. Normal heart size, mediastinal contours, and pulmonary vascularity for technique. Lungs clear. No pleural effusion or pneumothorax. Tiny metallic foreign bodies right chest again noted.  IMPRESSION: No acute abnormalities.   Original Report Authenticated By: Ulyses Southward, M.D.     Microbiology: Recent Results (from the past 240 hour(s))  MRSA PCR SCREENING     Status: Normal   Collection Time   12/12/11 12:01 AM      Component Value Range Status Comment   MRSA by PCR NEGATIVE  NEGATIVE Final      Labs: Basic Metabolic Panel:  Lab 12/12/11 9147 12/11/11 1256  NA 137 136  K 5.4* 4.4  CL 96 94*  CO2 28 27  GLUCOSE 193* 313*  BUN 49* 39*  CREATININE 8.28* 7.05*  CALCIUM 9.5 9.6  MG -- --  PHOS -- --   Liver Function Tests:  Lab 12/11/11 1256  AST 13  ALT 14  ALKPHOS 94  BILITOT 0.2*  PROT 7.7  ALBUMIN 3.7    Lab 12/11/11 1256  LIPASE 52  AMYLASE --   No results found for this basename: AMMONIA:5 in the last 168 hours CBC:  Lab 12/12/11 0222 12/11/11 1256  WBC 15.7* 13.3*  NEUTROABS -- 10.7*  HGB 10.4* 10.7*  HCT 31.9* 32.2*  MCV 95.5 92.5  PLT 270 266   Cardiac Enzymes:  Lab 12/12/11 0918 12/12/11 0221 12/11/11 2006  CKTOTAL -- -- --  CKMB -- -- --  CKMBINDEX -- -- --  TROPONINI <0.30 <0.30 <0.30   BNP: BNP (last 3 results) No results found for this basename: PROBNP:3 in the last 8760 hours CBG:  Lab 12/12/11 1129 12/12/11 0715 12/11/11 1958 12/11/11 1703 12/11/11 1312  GLUCAP 217* 178* 309* 226* 281*       Signed:  Toya Smothers M  [facility] 12/12/2011, 12:59 PM

## 2011-12-12 NOTE — Progress Notes (Signed)
Pt provided with dc instructions and education. Pt verbalized understanding. Pt educated on how to take new medications (midodrine increased on HD days). Pt teachback how to take medication. IV removed with tip intact. Heart monitor cleaned and returned to front. Levonne Spiller, RN

## 2011-12-12 NOTE — Progress Notes (Signed)
Inpatient Diabetes Program Recommendations  AACE/ADA: New Consensus Statement on Inpatient Glycemic Control (2013)  Target Ranges:  Prepandial:   less than 140 mg/dL      Peak postprandial:   less than 180 mg/dL (1-2 hours)      Critically ill patients:  140 - 180 mg/dL   Reason for Visit: Need updated HgbA1C  Inpatient Diabetes Program Recommendations HgbA1C: Check HgbA1C to assess glycemic control prior to hospitalization  Note: Will follow.

## 2011-12-12 NOTE — Discharge Summary (Signed)
Patient seen and examined by me.  Not complaint with diet (renal/cardiac/diabetic).  Seen by cardiology- ok with discharge  Marlin Canary DO

## 2011-12-12 NOTE — Progress Notes (Signed)
Subjective: No further syncope  HR up to 148 at times.  S. tach  Objective: Vital signs in last 24 hours: Temp:  [97.2 F (36.2 C)-98.3 F (36.8 C)] 97.9 F (36.6 C) (12/11 1200) Pulse Rate:  [71-90] 90  (12/11 1200) Resp:  [10-20] 18  (12/11 1200) BP: (93-157)/(44-85) 130/85 mmHg (12/11 1200) SpO2:  [91 %-100 %] 99 % (12/11 1200) Weight:  [101.606 kg (224 lb)-104.327 kg (230 lb)] 102.513 kg (226 lb) (12/11 0400) Weight change:  Last BM Date: 12/10/11 Intake/Output from previous day: +360   Intake/Output this shift: Total I/O In: 360 [P.O.:360] Out: -   PE: General:alert and oriented Heart:S1S2 RRR Lungs:clear ZOX:WRUEA, soft, non tender Ext:no edema    Lab Results:  Basename 12/12/11 0222 12/11/11 1256  WBC 15.7* 13.3*  HGB 10.4* 10.7*  HCT 31.9* 32.2*  PLT 270 266   BMET  Basename 12/12/11 0222 12/11/11 1256  NA 137 136  K 5.4* 4.4  CL 96 94*  CO2 28 27  GLUCOSE 193* 313*  BUN 49* 39*  CREATININE 8.28* 7.05*  CALCIUM 9.5 9.6    Basename 12/12/11 0918 12/12/11 0221  TROPONINI <0.30 <0.30    Lab Results  Component Value Date   CHOL  Value: 97        ATP III CLASSIFICATION:  <200     mg/dL   Desirable  540-981  mg/dL   Borderline High  >=191    mg/dL   High        4/78/2956   HDL 30* 08/24/2009   LDLCALC  Value: 37        Total Cholesterol/HDL:CHD Risk Coronary Heart Disease Risk Table                     Men   Women  1/2 Average Risk   3.4   3.3  Average Risk       5.0   4.4  2 X Average Risk   9.6   7.1  3 X Average Risk  23.4   11.0        Use the calculated Patient Ratio above and the CHD Risk Table to determine the patient's CHD Risk.        ATP III CLASSIFICATION (LDL):  <100     mg/dL   Optimal  213-086  mg/dL   Near or Above                    Optimal  130-159  mg/dL   Borderline  578-469  mg/dL   High  >629     mg/dL   Very High 05/29/4130   TRIG 152* 08/24/2009   CHOLHDL 3.2 08/24/2009   Lab Results  Component Value Date   HGBA1C  Value: 7.3  (NOTE)                                                                       According to the ADA Clinical Practice Recommendations for 2011, when HbA1c is used as a screening test:   >=6.5%   Diagnostic of Diabetes Mellitus           (if abnormal result  is confirmed)  5.7-6.4%   Increased risk of  developing Diabetes Mellitus  References:Diagnosis and Classification of Diabetes Mellitus,Diabetes Care,2011,34(Suppl 1):S62-S69 and Standards of Medical Care in         Diabetes - 2011,Diabetes Care,2011,34  (Suppl 1):S11-S61.* 08/23/2009     Lab Results  Component Value Date   TSH 0.873 08/23/2009    Hepatic Function Panel  Basename 12/11/11 1256  PROT 7.7  ALBUMIN 3.7  AST 13  ALT 14  ALKPHOS 94  BILITOT 0.2*  BILIDIR <0.1  IBILI NOT CALCULATED   No results found for this basename: CHOL in the last 72 hours No results found for this basename: PROTIME in the last 72 hours    EKG: Orders placed during the hospital encounter of 12/11/11  . EKG 12-LEAD  . EKG 12-LEAD    Studies/Results: Ct Head Wo Contrast  12/11/2011  *RADIOLOGY REPORT*  Clinical Data: Syncope.  Hypotension.  Lethargy.  Nausea and vomiting.  CT HEAD WITHOUT CONTRAST  Technique:  Contiguous axial images were obtained from the base of the skull through the vertex without contrast.  Comparison: 06/21/2010  Findings: Diffuse cerebral atrophy.  Low attenuation change throughout the deep and.  There are white matter consistent small vessel ischemia.  Ventricular dilatation consistent with central atrophy.  No mass effect or midline shift.  No abnormal extra-axial fluid collections.  Gray-white matter junctions are distinct. Basal cisterns are not effaced. No acute intracranial hemorrhage. No significant change in appearance of intracranial contents since previous study. No depressed skull fractures.  Visualized paranasal sinuses and mastoid air cells are not opacified.  Vascular calcifications.  IMPRESSION: No acute intracranial  abnormalities.  Chronic atrophy and small vessel ischemic changes.   Original Report Authenticated By: Burman Nieves, M.D.    Dg Chest Port 1 View  12/11/2011  *RADIOLOGY REPORT*  Clinical Data: Chest pain, diabetes, end-stage renal disease on dialysis, CHF  PORTABLE CHEST - 1 VIEW  Comparison: Portable exam 1304 hours compared to 10/30/2011  Findings: Large bore dual lumen left jugular central venous catheter, tip projecting over right atrium. Normal heart size, mediastinal contours, and pulmonary vascularity for technique. Lungs clear. No pleural effusion or pneumothorax. Tiny metallic foreign bodies right chest again noted.  IMPRESSION: No acute abnormalities.   Original Report Authenticated By: Ulyses Southward, M.D.     Medications: I have reviewed the patient's current medications.    Marland Kitchen antiseptic oral rinse  15 mL Mouth Rinse BID  . aspirin  325 mg Oral Daily  . calcium acetate  2,668 mg Oral TID WC  . cinacalcet  60 mg Oral Daily  . heparin  5,000 Units Subcutaneous Q8H  . insulin aspart  0-15 Units Subcutaneous TID WC  . insulin glargine  50 Units Subcutaneous QHS  . midodrine  10 mg Oral Q T,Th,Sa-HD  . multivitamin  1 tablet Oral QHS  . [COMPLETED] ondansetron (ZOFRAN) IV  4 mg Intravenous Once  . pantoprazole  40 mg Oral Daily  . rOPINIRole  0.25 mg Oral Custom  . rOPINIRole  0.5 mg Oral QHS  . simvastatin  20 mg Oral q1800  . sodium chloride  3 mL Intravenous Q12H  . [DISCONTINUED] b complex-vitamin c-folic acid  1 tablet Oral Daily  . [DISCONTINUED] midodrine  5 mg Oral Q T,Th,Sa-HD  . [DISCONTINUED] rOPINIRole  0.25-5 mg Oral TID   Assessment/Plan: Principal Problem:  *Syncope Active Problems:  Blind  Hypotension  ESRD (end stage renal disease) on dialysis  DM2 (diabetes mellitus, type 2)  Cardiomyopathy  Anemia  PLAN:negative MI, only  Stach on monitor-probable vasovagel post dialysis. Pt to be discharged. He does have history orthostatic Hypotension. Echo was  ordered not yet done.  Could have as outpt.  He has app[facility] in [place]  [facility]  We would be glad to see if he desires.  LOS: 1 day   INGOLD,LAURA R 12/12/2011, 12:56 PM

## 2011-12-12 NOTE — Progress Notes (Signed)
Pt. Seen and examined. Agree with the NP/PA-C note as written.  Low risk features for syncope.  Occasional sinus tachycardia. Probably vasovagal syncope. Appropriate for follow-up in the [place] system with his cardiologist.  Chrystie Nose, MD, [facility] Attending Cardiologist [facility]

## 2011-12-12 NOTE — Consult Note (Signed)
I have personally seen and examined this patient and agree with the assessment/plan as outlined above by [facility] PA. This is a late entry for the patient seen yesterday (12/11/2011) afternoon at 1615h in CDU10  Caedence Snowden K.,MD 12/12/2011 8:38 AM

## 2011-12-12 NOTE — Progress Notes (Signed)
Patient ID: [name], male   DOB: Jan 31, 1945, 66 y.o.   MRN: [id number]     [facility] Progress Note    Subjective:   Reports no further acute symptoms overnight. Denies chest pain and speech better.   Objective:   BP 109/65  Pulse 85  Temp 98 F (36.7 C) (Oral)  Resp 18  Ht 5\' 11"  (1.803 m)  Wt 102.513 kg (226 lb)  BMI 31.52 kg/m2  SpO2 92%  Physical Exam: WGN:[id number] resting in bed ION:GEXBM RRR, normal S1 and S2, no murmur/rub Resp:CTA bilaterally, no rales/rhonchi WUX:LKGM, NT, BS normal Ext:No LE edema  Labs: BMET  Lab 12/12/11 0222 12/11/11 1256  NA 137 136  K 5.4* 4.4  CL 96 94*  CO2 28 27  GLUCOSE 193* 313*  BUN 49* 39*  CREATININE 8.28* 7.05*  ALB -- --  CALCIUM 9.5 9.6  PHOS -- --   CBC  Lab 12/12/11 0222 12/11/11 1256  WBC 15.7* 13.3*  NEUTROABS -- 10.7*  HGB 10.4* 10.7*  HCT 31.9* 32.2*  MCV 95.5 92.5  PLT 270 266    Medications:      . antiseptic oral rinse  15 mL Mouth Rinse BID  . aspirin  325 mg Oral Daily  . calcium acetate  2,668 mg Oral TID WC  . cinacalcet  60 mg Oral Daily  . heparin  5,000 Units Subcutaneous Q8H  . insulin aspart  0-15 Units Subcutaneous TID WC  . insulin glargine  50 Units Subcutaneous QHS  . midodrine  5 mg Oral Q T,Th,Sa-HD  . multivitamin  1 tablet Oral QHS  . [COMPLETED] ondansetron (ZOFRAN) IV  4 mg Intravenous Once  . pantoprazole  40 mg Oral Daily  . rOPINIRole  0.25 mg Oral Custom  . rOPINIRole  0.5 mg Oral QHS  . simvastatin  20 mg Oral q1800  . sodium chloride  3 mL Intravenous Q12H  . [DISCONTINUED] b complex-vitamin c-folic acid  1 tablet Oral Daily  . [DISCONTINUED] rOPINIRole  0.25-5 mg Oral TID     Assessment/ Plan:   1. Syncopal episode: Cardiac enzymes and telemetry negative overnight. Suspect to have been a vasovagal event post dialysis. Seen by cardiology as well who feel that the patient can be safely discharged today with out-patient follow-up as no  further work-up is planned/indicated. 2.ESRD: completed full HD yesterday- plan next elective HD Thursday likely at his dialysis center in [place].  3. Anemia: resume ESA, he denies any overt losses  4. DM: Continue glycemic control per out-patient schedule  5. CKD-MBD: resume binders and VDRA/sensipar  6. Hypotension: Chronic problem and amplified by fluid shifts on HD- for this, he takes midodrine 5mg  PO TIW pre-HD. Will increase this to 10mg  PO TIW prior to HD (to limit intra-HD drops)    Zetta Bills, MD 12/12/2011, 8:41 AM

## 2011-12-27 ENCOUNTER — Ambulatory Visit (HOSPITAL_COMMUNITY)
Admission: RE | Admit: 2011-12-27 | Discharge: 2011-12-27 | Disposition: A | Discharge status: Home / Self Care | Payer: Medicare Other | Source: Ambulatory Visit | Attending: Nephrology | Admitting: Nephrology

## 2011-12-27 ENCOUNTER — Emergency Department (HOSPITAL_COMMUNITY)
Admission: EM | Admit: 2011-12-27 | Discharge: 2011-12-27 | Disposition: A | Discharge status: Home / Self Care | Payer: Medicare Other

## 2011-12-27 ENCOUNTER — Other Ambulatory Visit (HOSPITAL_COMMUNITY): Payer: Self-pay | Admitting: Nephrology

## 2011-12-27 DIAGNOSIS — E8779 Other fluid overload: Secondary | ICD-10-CM

## 2012-02-14 ENCOUNTER — Emergency Department (HOSPITAL_COMMUNITY): Payer: Medicare Other

## 2012-02-14 ENCOUNTER — Emergency Department (HOSPITAL_COMMUNITY)
Admission: EM | Admit: 2012-02-14 | Discharge: 2012-02-15 | Disposition: A | Discharge status: Home / Self Care | Payer: Medicare Other | Attending: Emergency Medicine | Admitting: Emergency Medicine

## 2012-02-14 ENCOUNTER — Encounter (HOSPITAL_COMMUNITY): Payer: Self-pay | Admitting: *Deleted

## 2012-02-14 DIAGNOSIS — F19939 Other psychoactive substance use, unspecified with withdrawal, unspecified: Secondary | ICD-10-CM | POA: Insufficient documentation

## 2012-02-14 DIAGNOSIS — F1123 Opioid dependence with withdrawal: Secondary | ICD-10-CM

## 2012-02-14 DIAGNOSIS — Z992 Dependence on renal dialysis: Secondary | ICD-10-CM | POA: Insufficient documentation

## 2012-02-14 DIAGNOSIS — Z79899 Other long term (current) drug therapy: Secondary | ICD-10-CM | POA: Insufficient documentation

## 2012-02-14 DIAGNOSIS — Z794 Long term (current) use of insulin: Secondary | ICD-10-CM | POA: Insufficient documentation

## 2012-02-14 DIAGNOSIS — Z87891 Personal history of nicotine dependence: Secondary | ICD-10-CM | POA: Insufficient documentation

## 2012-02-14 DIAGNOSIS — Z7982 Long term (current) use of aspirin: Secondary | ICD-10-CM | POA: Insufficient documentation

## 2012-02-14 DIAGNOSIS — F1193 Opioid use, unspecified with withdrawal: Secondary | ICD-10-CM

## 2012-02-14 DIAGNOSIS — E119 Type 2 diabetes mellitus without complications: Secondary | ICD-10-CM | POA: Insufficient documentation

## 2012-02-14 DIAGNOSIS — I509 Heart failure, unspecified: Secondary | ICD-10-CM | POA: Insufficient documentation

## 2012-02-14 DIAGNOSIS — H543 Unqualified visual loss, both eyes: Secondary | ICD-10-CM | POA: Insufficient documentation

## 2012-02-14 DIAGNOSIS — Z87448 Personal history of other diseases of urinary system: Secondary | ICD-10-CM | POA: Insufficient documentation

## 2012-02-14 NOTE — ED Notes (Addendum)
Pt has hx of dialysis, pt went to dialysis today but was told he has more fluid on him than what they took off. Pt states he finished this evening and he has since been SOB. Pt states he felt like he was going to pass out. Pt also nauseated. Pt able to speak in full sentences and able to take deep breaths. Pt not in respiratory distress at this time. Pt unable to sit still in triage due to generalized body aches which he took percocet for and has run out. Pt has portacath in chest for access.

## 2012-02-15 LAB — COMPREHENSIVE METABOLIC PANEL
ALT: 11 U/L (ref 0–53)
Alkaline Phosphatase: 64 U/L (ref 39–117)
BUN: 42 mg/dL — ABNORMAL HIGH (ref 6–23)
CO2: 26 mEq/L (ref 19–32)
Chloride: 92 mEq/L — ABNORMAL LOW (ref 96–112)
GFR calc Af Amer: 9 mL/min — ABNORMAL LOW (ref >90)
GFR calc non Af Amer: 8 mL/min — ABNORMAL LOW (ref >90)
Glucose, Bld: 382 mg/dL — ABNORMAL HIGH (ref 70–99)
Potassium: 5.2 mEq/L — ABNORMAL HIGH (ref 3.5–5.1)
Sodium: 136 mEq/L (ref 135–145)
Total Bilirubin: 0.2 mg/dL — ABNORMAL LOW (ref 0.3–1.2)

## 2012-02-15 LAB — CBC WITH DIFFERENTIAL/PLATELET
Eosinophils Absolute: 0.2 10*3/uL (ref 0.0–0.7)
Hemoglobin: 12 g/dL — ABNORMAL LOW (ref 13.0–17.0)
Lymphocytes Relative: 26 % (ref 12–46)
Lymphs Abs: 2.7 10*3/uL (ref 0.7–4.0)
MCH: 30.5 pg (ref 26.0–34.0)
Monocytes Relative: 8 % (ref 3–12)
Neutrophils Relative %: 63 % (ref 43–77)
Platelets: 275 10*3/uL (ref 150–400)
RBC: 3.94 MIL/uL — ABNORMAL LOW (ref 4.22–5.81)
WBC: 10.3 10*3/uL (ref 4.0–10.5)

## 2012-02-15 LAB — TROPONIN I: Troponin I: <0.3 ng/mL (ref <0.30)

## 2012-02-15 MED ORDER — OXYCODONE-ACETAMINOPHEN 5-325 MG PO TABS
1.0000 | ORAL_TABLET | Freq: Once | ORAL | Status: AC
  Administered 2012-02-15: 1 via ORAL
  Filled 2012-02-15: qty 1

## 2012-02-15 MED ORDER — OXYCODONE-ACETAMINOPHEN 10-325 MG PO TABS: 1.0000 | ORAL_TABLET | Freq: Four times a day (QID) | ORAL | Status: DC | PRN

## 2012-02-15 NOTE — ED Provider Notes (Addendum)
History     CSN: [id number]  Arrival date & time 02/14/12  2224   First MD Initiated Contact with Patient 02/14/12 2358      Chief Complaint  Patient presents with  . Shortness of Breath    (Consider location/radiation/quality/duration/timing/severity/associated sxs/prior treatment) Patient is a 67 y.o. male presenting with shortness of breath. The history is provided by the patient.  Shortness of Breath Severity:  Moderate Onset quality:  Sudden Timing:  Constant Progression:  Resolved Chronicity:  Recurrent Context: not smoke exposure and not URI   Context comment:  Started while sitting on the bed and talking on the phone.  states it worsened after he got nauseated but could not vomit Relieved by:  Nothing Worsened by:  Nothing tried Ineffective treatments:  None tried Associated symptoms: no abdominal pain, no chest pain, no cough, no fever, no headaches, no syncope, no vomiting and no wheezing   Associated symptoms comment:  States he feels jittery Risk factors comment:  Dialysis   Past Medical History  Diagnosis Date  . Renal insufficiency   . CHF (congestive heart failure)   . Diabetes mellitus   . Blind t    Past Surgical History  Procedure Laterality Date  . Appendectomy      Family History  Problem Relation Age of Onset  . Diabetes Mellitus II Father     History  Substance Use Topics  . Smoking status: Former Games developer  . Smokeless tobacco: Not on file  . Alcohol Use: No      Review of Systems  Constitutional: Positive for unexpected weight change. Negative for fever.       Was up 5kg today at dialysis and has noticed some fullness in his abd  Respiratory: Positive for shortness of breath. Negative for cough and wheezing.   Cardiovascular: Negative for chest pain and syncope.  Gastrointestinal: Negative for vomiting and abdominal pain.  Skin:       Itching of the skin  Neurological: Negative for headaches.  All other systems reviewed and are  negative.    Allergies  Cefepime and Penicillins  Home Medications   Current Outpatient Rx  Name  Route  Sig  Dispense  Refill  . aspirin 325 MG tablet   Oral   Take 325 mg by mouth daily.         . calcium acetate (PHOSLO) 667 MG capsule   Oral   Take 4 capsules (2,668 mg total) by mouth 3 (three) times daily with meals.   360 capsule   0   . cinacalcet (SENSIPAR) 60 MG tablet   Oral   Take 60 mg by mouth daily.          . darbepoetin (ARANESP) 40 MCG/0.4ML SOLN   Intravenous   Inject 0.4 mLs (40 mcg total) into the vein every Thursday with hemodialysis.   8.4 mL      . folic acid-vitamin b complex-vitamin c-selenium-zinc (DIALYVITE) 3 MG TABS   Oral   Take 1 tablet by mouth daily.         . insulin aspart (NOVOLOG) 100 UNIT/ML injection   Subcutaneous   Inject 20-40 Units into the skin 3 (three) times daily before meals. Sliding scale         . insulin glargine (LANTUS) 100 UNIT/ML injection   Subcutaneous   Inject 50 Units into the skin at bedtime.   10 mL   0   . midodrine (PROAMATINE) 10 MG tablet   Oral  Take 1 tablet (10 mg total) by mouth Every Tuesday,Thursday,and Saturday with dialysis.   30 tablet   0   . multivitamin (RENA-VIT) TABS tablet   Oral   Take 1 tablet by mouth at bedtime.   30 tablet   0   . omeprazole (PRILOSEC) 20 MG capsule   Oral   Take 20 mg by mouth 2 (two) times daily.          Marland Kitchen oxycodone (OXY-IR) 5 MG capsule   Oral   Take 10 mg by mouth every 6 (six) hours as needed. For pain         . pravastatin (PRAVACHOL) 40 MG tablet   Oral   Take 40 mg by mouth Nightly.         Marland Kitchen rOPINIRole (REQUIP) 0.25 MG tablet   Oral   Take 0.25-0.5 mg by mouth 3 (three) times daily. 1 tablet twice a day and 2 tablets at bedtime.         . sennosides-docusate sodium (SENOKOT-S) 8.6-50 MG tablet   Oral   Take 2 tablets by mouth 2 (two) times daily as needed. For constipation         . vitamin B-12 (CYANOCOBALAMIN)  100 MCG tablet   Oral   Take 500 mcg by mouth 2 (two) times daily.           BP 146/72  Pulse 93  Temp(Src) 98.2 F (36.8 C) (Oral)  SpO2 97%  Physical Exam  Nursing note and vitals reviewed. Constitutional: He is oriented to person, place, and time. He appears well-developed and well-nourished. No distress.  HENT:  Head: Normocephalic and atraumatic.  Mouth/Throat: Oropharynx is clear and moist.  Eyes: Conjunctivae and EOM are normal. Pupils are equal, round, and reactive to light.  Neck: Normal range of motion. Neck supple.  Minimal JVD  Cardiovascular: Normal rate, regular rhythm and intact distal pulses.   No murmur heard. Pulmonary/Chest: Effort normal and breath sounds normal. No respiratory distress. He has no wheezes. He has no rales.  Dialysis cath in the left subclavian  Abdominal: Soft. He exhibits no distension. There is no tenderness. There is no rebound and no guarding.  Musculoskeletal: Normal range of motion. He exhibits no edema and no tenderness.  Neurological: He is alert and oriented to person, place, and time.  Skin: Skin is warm and dry. No rash noted. No erythema.  Psychiatric: He has a normal mood and affect. His behavior is normal.    ED Course  Procedures (including critical care time)  Labs Reviewed  CBC WITH DIFFERENTIAL - Abnormal; Notable for the following:    RBC 3.94 (*)    Hemoglobin 12.0 (*)    HCT 36.9 (*)    All other components within normal limits  COMPREHENSIVE METABOLIC PANEL - Abnormal; Notable for the following:    Potassium 5.2 (*)    Chloride 92 (*)    Glucose, Bld 382 (*)    BUN 42 (*)    Creatinine, Ser 6.85 (*)    Total Bilirubin 0.2 (*)    GFR calc non Af Amer 8 (*)    GFR calc Af Amer 9 (*)    All other components within normal limits  TROPONIN I   Dg Chest 2 View  02/14/2012  *RADIOLOGY REPORT*  Clinical Data: Shortness of breath.  CHEST - 2 VIEW  Comparison: January 01, 2012.  Findings: Cardiomediastinal  silhouette appears normal.  No acute pulmonary disease is noted.  Bony thorax  is intact.  Left internal jugular dialysis catheter is again noted and unchanged.  IMPRESSION: No acute cardiopulmonary abnormality seen.   Original Report Authenticated By: Lupita Raider.,  M.D.     Date: 02/15/2012  Rate: 94  Rhythm: normal sinus rhythm  QRS Axis: normal  Intervals: normal  ST/T Wave abnormalities: normal  Conduction Disutrbances: none  Narrative Interpretation: unremarkable      No diagnosis found.    MDM   Patient with a history of dialysis, CHF and diabetes who presents today because he was feeling jittery and short of breath earlier. He states this occurred around the same time he was feeling nauseated. He denied any chest pain states he still feeling jittery and shortness of breath has resolved.  Patient is satting 97% on room air and states that he does wear oxygen occasionally however this was off of oxygen. His blood pressure was within normal limits and he denies fever. EKG is normal. Patient states that he was up 5 kg today when he went to dialysis. Patient completed a full round of dialysis.  He does not know how much they took off today.  Pt is well appearing and does not appear SOB currently.  Lungs are clear.    Possible that this was a silent cardiac event vs electrolyte abnormality due to dialysis taking off too much today .  Lower suspicion for CHF today based on exam but pt does have some fullness in abd and mild JVD.  Also pt states that he ran out of his percocet which may be why he feels jittery and nauseated.  CXR clear.  CBC, CMP, BNP and troponin pending.  1:19 AM EKG wnl.  CBC wnl.  CMP with K of 5.2 which is about where it usually is and Blood sugar of 380.  After percocet pt is feeling much better.  HE states that they normally deliver the percocet to his home but has been delayed due to weather.  Discussed with him his results.  Troponin neg.  Will d/c  home.      Gwyneth Sprout, MD 02/15/12 1610  Gwyneth Sprout, MD 02/15/12 9604

## 2012-04-30 ENCOUNTER — Encounter (HOSPITAL_COMMUNITY): Payer: Self-pay | Admitting: *Deleted

## 2012-04-30 ENCOUNTER — Emergency Department (HOSPITAL_COMMUNITY): Payer: Medicare Other

## 2012-04-30 ENCOUNTER — Inpatient Hospital Stay (HOSPITAL_COMMUNITY)
Admission: EM | Admit: 2012-04-30 | Discharge: 2012-05-02 | DRG: 391 | Disposition: A | Discharge status: Home / Self Care | Payer: Medicare Other | Attending: Internal Medicine | Admitting: Internal Medicine

## 2012-04-30 DIAGNOSIS — N186 End stage renal disease: Secondary | ICD-10-CM | POA: Diagnosis present

## 2012-04-30 DIAGNOSIS — N039 Chronic nephritic syndrome with unspecified morphologic changes: Secondary | ICD-10-CM | POA: Diagnosis present

## 2012-04-30 DIAGNOSIS — K529 Noninfective gastroenteritis and colitis, unspecified: Secondary | ICD-10-CM

## 2012-04-30 DIAGNOSIS — Z992 Dependence on renal dialysis: Secondary | ICD-10-CM

## 2012-04-30 DIAGNOSIS — Z888 Allergy status to other drugs, medicaments and biological substances status: Secondary | ICD-10-CM

## 2012-04-30 DIAGNOSIS — D631 Anemia in chronic kidney disease: Secondary | ICD-10-CM | POA: Diagnosis present

## 2012-04-30 DIAGNOSIS — I252 Old myocardial infarction: Secondary | ICD-10-CM

## 2012-04-30 DIAGNOSIS — R195 Other fecal abnormalities: Secondary | ICD-10-CM

## 2012-04-30 DIAGNOSIS — A09 Infectious gastroenteritis and colitis, unspecified: Principal | ICD-10-CM | POA: Diagnosis present

## 2012-04-30 DIAGNOSIS — Z880 Allergy status to penicillin: Secondary | ICD-10-CM

## 2012-04-30 DIAGNOSIS — R4182 Altered mental status, unspecified: Secondary | ICD-10-CM

## 2012-04-30 DIAGNOSIS — J4489 Other specified chronic obstructive pulmonary disease: Secondary | ICD-10-CM | POA: Diagnosis present

## 2012-04-30 DIAGNOSIS — Z87891 Personal history of nicotine dependence: Secondary | ICD-10-CM

## 2012-04-30 DIAGNOSIS — E785 Hyperlipidemia, unspecified: Secondary | ICD-10-CM | POA: Diagnosis present

## 2012-04-30 DIAGNOSIS — J449 Chronic obstructive pulmonary disease, unspecified: Secondary | ICD-10-CM | POA: Diagnosis present

## 2012-04-30 DIAGNOSIS — I120 Hypertensive chronic kidney disease with stage 5 chronic kidney disease or end stage renal disease: Secondary | ICD-10-CM | POA: Diagnosis present

## 2012-04-30 DIAGNOSIS — N2581 Secondary hyperparathyroidism of renal origin: Secondary | ICD-10-CM | POA: Diagnosis present

## 2012-04-30 DIAGNOSIS — D649 Anemia, unspecified: Secondary | ICD-10-CM

## 2012-04-30 DIAGNOSIS — Z8673 Personal history of transient ischemic attack (TIA), and cerebral infarction without residual deficits: Secondary | ICD-10-CM

## 2012-04-30 DIAGNOSIS — R197 Diarrhea, unspecified: Secondary | ICD-10-CM

## 2012-04-30 DIAGNOSIS — E1142 Type 2 diabetes mellitus with diabetic polyneuropathy: Secondary | ICD-10-CM | POA: Diagnosis present

## 2012-04-30 DIAGNOSIS — Z7982 Long term (current) use of aspirin: Secondary | ICD-10-CM

## 2012-04-30 DIAGNOSIS — E1149 Type 2 diabetes mellitus with other diabetic neurological complication: Secondary | ICD-10-CM | POA: Diagnosis present

## 2012-04-30 DIAGNOSIS — E119 Type 2 diabetes mellitus without complications: Secondary | ICD-10-CM

## 2012-04-30 DIAGNOSIS — Z79899 Other long term (current) drug therapy: Secondary | ICD-10-CM

## 2012-04-30 DIAGNOSIS — I428 Other cardiomyopathies: Secondary | ICD-10-CM | POA: Diagnosis present

## 2012-04-30 DIAGNOSIS — Z794 Long term (current) use of insulin: Secondary | ICD-10-CM

## 2012-04-30 DIAGNOSIS — E860 Dehydration: Secondary | ICD-10-CM | POA: Diagnosis present

## 2012-04-30 DIAGNOSIS — Z833 Family history of diabetes mellitus: Secondary | ICD-10-CM

## 2012-04-30 DIAGNOSIS — E1129 Type 2 diabetes mellitus with other diabetic kidney complication: Secondary | ICD-10-CM | POA: Diagnosis present

## 2012-04-30 HISTORY — DX: Old myocardial infarction: I25.2

## 2012-04-30 HISTORY — DX: End stage renal disease: N18.6

## 2012-04-30 HISTORY — DX: Atherosclerotic heart disease of native coronary artery without angina pectoris: I25.10

## 2012-04-30 HISTORY — DX: Hyperlipidemia, unspecified: E78.5

## 2012-04-30 HISTORY — DX: Chronic obstructive pulmonary disease, unspecified: J44.9

## 2012-04-30 HISTORY — DX: End stage renal disease: Z99.2

## 2012-04-30 LAB — COMPREHENSIVE METABOLIC PANEL
ALT: 14 U/L (ref 0–53)
Alkaline Phosphatase: 77 U/L (ref 39–117)
BUN: 44 mg/dL — ABNORMAL HIGH (ref 6–23)
CO2: 24 mEq/L (ref 19–32)
GFR calc Af Amer: 8 mL/min — ABNORMAL LOW (ref >90)
GFR calc non Af Amer: 7 mL/min — ABNORMAL LOW (ref >90)
Glucose, Bld: 273 mg/dL — ABNORMAL HIGH (ref 70–99)
Potassium: 4.3 mEq/L (ref 3.5–5.1)
Sodium: 134 mEq/L — ABNORMAL LOW (ref 135–145)

## 2012-04-30 LAB — TROPONIN I: Troponin I: <0.3 ng/mL (ref <0.30)

## 2012-04-30 LAB — CBC
HCT: 35.7 % — ABNORMAL LOW (ref 39.0–52.0)
Hemoglobin: 11.8 g/dL — ABNORMAL LOW (ref 13.0–17.0)
MCH: 30.9 pg (ref 26.0–34.0)
RBC: 3.82 MIL/uL — ABNORMAL LOW (ref 4.22–5.81)

## 2012-04-30 MED ORDER — CIPROFLOXACIN IN D5W 400 MG/200ML IV SOLN
400.0000 mg | Freq: Once | INTRAVENOUS | Status: AC
  Administered 2012-05-01: 400 mg via INTRAVENOUS
  Filled 2012-04-30: qty 200

## 2012-04-30 MED ORDER — METRONIDAZOLE IN NACL 5-0.79 MG/ML-% IV SOLN
500.0000 mg | Freq: Once | INTRAVENOUS | Status: AC
  Administered 2012-05-01: 500 mg via INTRAVENOUS
  Filled 2012-04-30: qty 100

## 2012-04-30 MED ORDER — IOHEXOL 300 MG/ML  SOLN
50.0000 mL | Freq: Once | INTRAMUSCULAR | Status: AC | PRN
  Administered 2012-04-30: 50 mL via ORAL

## 2012-04-30 MED ORDER — IOHEXOL 300 MG/ML  SOLN
100.0000 mL | Freq: Once | INTRAMUSCULAR | Status: AC | PRN
  Administered 2012-04-30: 100 mL via INTRAVENOUS

## 2012-04-30 MED ORDER — SODIUM CHLORIDE 0.9 % IV BOLUS (SEPSIS)
1000.0000 mL | Freq: Once | INTRAVENOUS | Status: AC
  Administered 2012-04-30: 1000 mL via INTRAVENOUS

## 2012-04-30 NOTE — ED Notes (Addendum)
PT is here with weakness and diarrhea all day.  Blood sugar 242 at triage.  Pt is dialysis patient.  Pt wife states patient has been in and out of it since this am at 1000:  Last HD was yesterday morning.  Pt is lethargic at triage but arousable.

## 2012-04-30 NOTE — ED Provider Notes (Signed)
[name] is a 67 y.o. male with history of CHF, and diabetes and renal insufficiency that presents emergency department with his wife and daughter for altered mental status and diarrhea.  Patient care resumed from Dr. Blinda Leatherwood.  Previous provider reported that patient was without focal neuro deficits and appeared exhausted from generalized weakness due to severe diarrhea.  Patient is easily arousable and able to answer questions without difficulty.  On repeat exam BP 127/59  Pulse 88  Temp(Src) 98 F (36.7 C) (Oral)  Resp 10  SpO2 100% patient is with diffuse abdominal pain and actively having diarrhea.  Hemoccult positive.  Leukocytosis present and creatinine of 7.3.  Plan per previous provider is to get a CT abdomen pelvis with pending stool cultures and C. difficile.  Patient denies any recent travel, suspect a food intake or chronic antibiotic use. No recent hospitalization.  CT ABDOMEN AND PELVIS WITH CONTRAST Contrast: OMNIPAQUE IOHEXOL 300 MG/ML SOLN Comparison: 06/21/2010 CT. Findings: Lung Bases: Dependent atelectasis at the left lung base. valvular and coronary artery calcification in the heart. Right gynecomastia incidentally noted. Old left rib fractures. Lver: Normal. Spleen: Normal. Gallbladder: Partially contracted. Normal. Common bile duct: No calcified stones. Pancreas: Mild atrophy. No mass lesion. No inflammatory changes. Adrenal glands: Normal. Kidneys: Bilateral renal atrophy. Both ureters appear within normal limits. Stomach: Mild mural thickening is nonspecific. This may be due to under distention or associated with chronic gastritis. Clinically correlate. Small bowel: The duodenum appears normal. Abdominal small bowel and pelvic small bowel appears normal. No obstruction. Colon: Normal appendix. No inflammatory changes of colon. The majority of the colon is decompressed with mural thickening associated with decompression. Pelvic Genitourinary:  Artifact from left hip arthroplasty obscures part of the pelvis. Urinary bladder shows a tiny amount of fluid and mural thickening which is likely secondary to under distention. The fat containing right inguinal hernia. Bones: No aggressive osseous lesions. Old lumbar compression fractures. No acute abnormality. Vasculature: Atherosclerosis. No acute vascular abnormality.  IMPRESSION: No acute abdominal abnormality. The colon is decompressed. Mild wall thickening is favored to be due to nondistension of the colon however mild colitis could be present in this patient with diarrhea.  Atrophic native kidneys.  11:26 PM Lab lost serum lactic acid adn needed to be re-drawn. Currently in process. Pt has had a total of 6 diarrhea episodes since arrival in ED. Likely diagnosis of Colitis vs ischemic bowel. Pt will be kept NPO until further notice. Will discuss abx choice with admitting physician.   11:41 PM Pt started on Cipro/ Flagyl. Lactic acid elevated. Discussed w admitting physician. The patient appears reasonably stabilized for admission considering the current resources, flow, and capabilities available in the ED at this time, and I doubt any other [facility] requiring further screening and/or treatment in the ED prior to admission.  12:07 AM Pt is DM, as pt is kept NPO it is advised to check CBG q 2 hrs to assure hypoglycemia does not occur d/t NPO order.     Jaci Carrel, [place] 05/01/12 0008

## 2012-05-01 ENCOUNTER — Encounter (HOSPITAL_COMMUNITY): Payer: Self-pay | Admitting: Internal Medicine

## 2012-05-01 DIAGNOSIS — K5289 Other specified noninfective gastroenteritis and colitis: Secondary | ICD-10-CM

## 2012-05-01 DIAGNOSIS — E119 Type 2 diabetes mellitus without complications: Secondary | ICD-10-CM

## 2012-05-01 DIAGNOSIS — N186 End stage renal disease: Secondary | ICD-10-CM

## 2012-05-01 DIAGNOSIS — D649 Anemia, unspecified: Secondary | ICD-10-CM

## 2012-05-01 DIAGNOSIS — R197 Diarrhea, unspecified: Secondary | ICD-10-CM

## 2012-05-01 LAB — CBC
HCT: 33.2 % — ABNORMAL LOW (ref 39.0–52.0)
Hemoglobin: 10.9 g/dL — ABNORMAL LOW (ref 13.0–17.0)
MCV: 92.5 fL (ref 78.0–100.0)
Platelets: 223 10*3/uL (ref 150–400)
RBC: 3.59 MIL/uL — ABNORMAL LOW (ref 4.22–5.81)
WBC: 13.3 10*3/uL — ABNORMAL HIGH (ref 4.0–10.5)

## 2012-05-01 LAB — COMPREHENSIVE METABOLIC PANEL
Albumin: 3.2 g/dL — ABNORMAL LOW (ref 3.5–5.2)
BUN: 46 mg/dL — ABNORMAL HIGH (ref 6–23)
Calcium: 9.1 mg/dL (ref 8.4–10.5)
Creatinine, Ser: 8.03 mg/dL — ABNORMAL HIGH (ref 0.50–1.35)
Total Protein: 6.9 g/dL (ref 6.0–8.3)

## 2012-05-01 LAB — GLUCOSE, CAPILLARY
Glucose-Capillary: 103 mg/dL — ABNORMAL HIGH (ref 70–99)
Glucose-Capillary: 219 mg/dL — ABNORMAL HIGH (ref 70–99)

## 2012-05-01 MED ORDER — SODIUM CHLORIDE 0.9 % IV SOLN
INTRAVENOUS | Status: AC
  Administered 2012-05-01: 10 mL/h via INTRAVENOUS

## 2012-05-01 MED ORDER — SIMVASTATIN 5 MG PO TABS
5.0000 mg | ORAL_TABLET | Freq: Every day | ORAL | Status: DC
  Filled 2012-05-01 (×2): qty 1

## 2012-05-01 MED ORDER — OXYCODONE-ACETAMINOPHEN 5-325 MG PO TABS
ORAL_TABLET | ORAL | Status: AC
  Administered 2012-05-01: 1 via ORAL
  Filled 2012-05-01: qty 1

## 2012-05-01 MED ORDER — OXYCODONE-ACETAMINOPHEN 10-325 MG PO TABS: 1.0000 | ORAL_TABLET | Freq: Four times a day (QID) | ORAL | Status: DC | PRN

## 2012-05-01 MED ORDER — PENTAFLUOROPROP-TETRAFLUOROETH EX AERO: 1.0000 "application " | INHALATION_SPRAY | CUTANEOUS | Status: DC | PRN

## 2012-05-01 MED ORDER — OXYCODONE HCL 5 MG PO TABS
ORAL_TABLET | ORAL | Status: AC
  Administered 2012-05-01: 5 mg via ORAL
  Filled 2012-05-01: qty 1

## 2012-05-01 MED ORDER — ONDANSETRON HCL 4 MG/2ML IJ SOLN: 4.0000 mg | Freq: Three times a day (TID) | INTRAMUSCULAR | Status: AC | PRN

## 2012-05-01 MED ORDER — HYDROMORPHONE HCL PF 1 MG/ML IJ SOLN
1.0000 mg | Freq: Once | INTRAMUSCULAR | Status: AC
  Administered 2012-05-01: 1 mg via INTRAVENOUS
  Filled 2012-05-01: qty 1

## 2012-05-01 MED ORDER — INSULIN ASPART 100 UNIT/ML ~~LOC~~ SOLN
0.0000 [IU] | Freq: Three times a day (TID) | SUBCUTANEOUS | Status: DC
  Administered 2012-05-01: 1 [IU] via SUBCUTANEOUS
  Administered 2012-05-02: 2 [IU] via SUBCUTANEOUS

## 2012-05-01 MED ORDER — RENA-VITE PO TABS
1.0000 | ORAL_TABLET | Freq: Every day | ORAL | Status: DC
  Administered 2012-05-01: 1 via ORAL
  Filled 2012-05-01 (×3): qty 1

## 2012-05-01 MED ORDER — CIPROFLOXACIN IN D5W 400 MG/200ML IV SOLN
400.0000 mg | Freq: Two times a day (BID) | INTRAVENOUS | Status: DC
  Filled 2012-05-01: qty 200

## 2012-05-01 MED ORDER — ALTEPLASE 2 MG IJ SOLR
4.0000 mg | Freq: Once | INTRAMUSCULAR | Status: AC | PRN
  Administered 2012-05-01: 4 mg
  Filled 2012-05-01: qty 4

## 2012-05-01 MED ORDER — DOXERCALCIFEROL 4 MCG/2ML IV SOLN
INTRAVENOUS | Status: AC
  Administered 2012-05-01: 2 ug via INTRAVENOUS
  Filled 2012-05-01: qty 2

## 2012-05-01 MED ORDER — CIPROFLOXACIN IN D5W 400 MG/200ML IV SOLN
400.0000 mg | INTRAVENOUS | Status: DC
  Filled 2012-05-01 (×2): qty 200

## 2012-05-01 MED ORDER — MIDODRINE HCL 5 MG PO TABS
ORAL_TABLET | ORAL | Status: AC
  Administered 2012-05-01: 10 mg via ORAL
  Filled 2012-05-01: qty 2

## 2012-05-01 MED ORDER — ROPINIROLE HCL 0.5 MG PO TABS
0.5000 mg | ORAL_TABLET | Freq: Every day | ORAL | Status: DC
  Administered 2012-05-01: 0.5 mg via ORAL
  Filled 2012-05-01 (×2): qty 1

## 2012-05-01 MED ORDER — ALTEPLASE 2 MG IJ SOLR
2.0000 mg | Freq: Once | INTRAMUSCULAR | Status: DC | PRN
  Filled 2012-05-01 (×2): qty 2

## 2012-05-01 MED ORDER — MIDODRINE HCL 5 MG PO TABS
10.0000 mg | ORAL_TABLET | ORAL | Status: DC
  Filled 2012-05-01: qty 2

## 2012-05-01 MED ORDER — ROPINIROLE HCL 0.25 MG PO TABS
0.2500 mg | ORAL_TABLET | Freq: Two times a day (BID) | ORAL | Status: DC
  Administered 2012-05-02: 0.25 mg via ORAL
  Filled 2012-05-01 (×3): qty 1

## 2012-05-01 MED ORDER — SODIUM CHLORIDE 0.9 % IV SOLN: 100.0000 mL | INTRAVENOUS | Status: DC | PRN

## 2012-05-01 MED ORDER — CINACALCET HCL 30 MG PO TABS
60.0000 mg | ORAL_TABLET | Freq: Every day | ORAL | Status: DC
  Filled 2012-05-01 (×2): qty 2

## 2012-05-01 MED ORDER — HEPARIN SODIUM (PORCINE) 1000 UNIT/ML DIALYSIS
9000.0000 [IU] | Freq: Once | INTRAMUSCULAR | Status: AC
  Administered 2012-05-01: 9000 [IU] via INTRAVENOUS_CENTRAL
  Filled 2012-05-01: qty 9

## 2012-05-01 MED ORDER — MIDODRINE HCL 5 MG PO TABS: 10.0000 mg | ORAL_TABLET | ORAL | Status: DC

## 2012-05-01 MED ORDER — HYDROMORPHONE HCL PF 1 MG/ML IJ SOLN
1.0000 mg | INTRAMUSCULAR | Status: AC | PRN
  Administered 2012-05-01 (×2): 1 mg via INTRAVENOUS
  Filled 2012-05-01 (×2): qty 1

## 2012-05-01 MED ORDER — OXYCODONE-ACETAMINOPHEN 5-325 MG PO TABS
1.0000 | ORAL_TABLET | Freq: Four times a day (QID) | ORAL | Status: DC | PRN
  Administered 2012-05-01 – 2012-05-02 (×3): 1 via ORAL
  Filled 2012-05-01 (×3): qty 1

## 2012-05-01 MED ORDER — METRONIDAZOLE IN NACL 5-0.79 MG/ML-% IV SOLN
500.0000 mg | Freq: Three times a day (TID) | INTRAVENOUS | Status: DC
  Administered 2012-05-01 – 2012-05-02 (×3): 500 mg via INTRAVENOUS
  Filled 2012-05-01 (×6): qty 100

## 2012-05-01 MED ORDER — HEPARIN SODIUM (PORCINE) 1000 UNIT/ML DIALYSIS
1000.0000 [IU] | INTRAMUSCULAR | Status: DC | PRN
  Filled 2012-05-01: qty 1

## 2012-05-01 MED ORDER — LIDOCAINE HCL (PF) 1 % IJ SOLN: 5.0000 mL | INTRAMUSCULAR | Status: DC | PRN

## 2012-05-01 MED ORDER — DOXERCALCIFEROL 4 MCG/2ML IV SOLN
2.0000 ug | INTRAVENOUS | Status: DC
  Filled 2012-05-01: qty 2

## 2012-05-01 MED ORDER — INSULIN GLARGINE 100 UNIT/ML ~~LOC~~ SOLN
20.0000 [IU] | Freq: Every day | SUBCUTANEOUS | Status: DC
  Administered 2012-05-01: 20 [IU] via SUBCUTANEOUS
  Filled 2012-05-01 (×2): qty 0.2

## 2012-05-01 MED ORDER — LIDOCAINE-PRILOCAINE 2.5-2.5 % EX CREA: 1.0000 "application " | TOPICAL_CREAM | CUTANEOUS | Status: DC | PRN

## 2012-05-01 MED ORDER — CYANOCOBALAMIN 500 MCG PO TABS
500.0000 ug | ORAL_TABLET | Freq: Two times a day (BID) | ORAL | Status: DC
  Administered 2012-05-01 – 2012-05-02 (×2): 500 ug via ORAL
  Filled 2012-05-01 (×4): qty 1

## 2012-05-01 MED ORDER — OXYCODONE HCL 5 MG PO TABS
5.0000 mg | ORAL_TABLET | Freq: Four times a day (QID) | ORAL | Status: DC | PRN
  Administered 2012-05-01 – 2012-05-02 (×3): 5 mg via ORAL
  Filled 2012-05-01 (×4): qty 1

## 2012-05-01 NOTE — Progress Notes (Signed)
Patient seen and examined Admitted this morning for diarrhea and abdominal pain. Recent colonoscopy at the [facility] No recent antibiotics CT scan shows possible colitis, start cipro and flagyl  C. difficile negative Continue clear liquid diet If hemoglobin drops yet will be consulted Doubt that the patient needs a colonoscopy at this time
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
## 2012-05-01 NOTE — Procedures (Signed)
[facility]  On HD via L IJ TDC BP--99/45 Goal--"keep even" Hgb--10.9   K+--4.1

## 2012-05-01 NOTE — H&P (Signed)
[facility] History and Physical  [name] GNF:[id number] DOB: 01-19-1945 DOA: 04/30/2012  Referring physician: ER physician. PCP: [name], MD  Specialists: Nephrologist for dialysis.  Chief Complaint: Diarrhea and abdominal pain.  HPI: [name] is a 67 y.o. 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 Denies any nausea vomiting fever chills. Patient has had multiple episodes of diarrhea even in the ER. Denies any blood in the diarrhea. As per patient's wife patient has had a recent colonoscopy few months ago at the [place]. Patient was not recently hospitalized or has not taken any antibiotics recently. In the ER CT abdomen and pelvis shows possibility of colitis. Lactic acid levels are mildly elevated. Stool for occult blood was positive. Initially patient blood pressure was on the lower side and improved with minimal fluids. Patient did have abdominal pain most lower part of the abdomen and patient is unable to correctly explained the character of the pain. Patient will be admitted for further management. Patient is a denies any chest pain or shortness of breath. On admission patient initially was mildly confused presently is alert and oriented.  Review of Systems: As presented in the history of presenting illness, rest negative.  Past Medical History  Diagnosis Date  . Renal insufficiency   . CHF (congestive heart failure)   . Diabetes mellitus   . Blind t   Past Surgical History  Procedure Laterality Date  . Appendectomy    . Av fitula     Social History:  reports that he has quit smoking. He does not have any smokeless tobacco history on file. He reports that he does not drink alcohol or use illicit drugs. Lives at home. where does patient live-- Not sure. Can patient participate in  ADLs?  Allergies  Allergen Reactions  . Cefepime Itching  . Penicillins Hives and Swelling    Family History  Problem Relation Age of Onset  . Diabetes Mellitus II Father       Prior to Admission medications   Medication Sig Start Date End Date Taking? Authorizing Provider  aspirin 325 MG tablet Take 325 mg by mouth daily.   Yes Historical Provider, MD  calcium acetate (PHOSLO) 667 MG capsule Take 4 capsules (2,668 mg total) by mouth 3 (three) times daily with meals. 11/04/11  Yes Joseph Art, DO  cinacalcet (SENSIPAR) 60 MG tablet Take 60 mg by mouth daily.    Yes Historical Provider, MD  darbepoetin (ARANESP) 40 MCG/0.4ML SOLN Inject 0.4 mLs (40 mcg total) into the vein every Thursday with hemodialysis. 11/04/11  Yes Joseph Art, DO  folic acid-vitamin b complex-vitamin c-selenium-zinc (DIALYVITE) 3 MG TABS Take 1 tablet by mouth daily.   Yes Historical Provider, MD  insulin aspart (NOVOLOG) 100 UNIT/ML injection Inject 20-40 Units into the skin 3 (three) times daily before meals. Sliding scale   Yes Historical Provider, MD  insulin glargine (LANTUS) 100 UNIT/ML injection Inject 50 Units into the skin at bedtime. 11/04/11  Yes Joseph Art, DO  midodrine (PROAMATINE) 10 MG tablet Take 1 tablet (10 mg total) by mouth Every Tuesday,Thursday,and Saturday with dialysis. 12/12/11  Yes Lesle Chris Black, NP  multivitamin (RENA-VIT) TABS tablet Take 1 tablet by mouth at bedtime. 12/12/11  Yes Lesle Chris Black, NP  omeprazole (PRILOSEC) 20 MG capsule Take 20 mg by mouth 2 (two) times daily.  Yes Historical Provider, MD  oxycodone (OXY-IR) 5 MG capsule Take 10 mg by mouth every 6 (six) hours as needed. For pain   Yes Historical Provider, MD  oxyCODONE-acetaminophen (PERCOCET) 10-325 MG per tablet Take 1 tablet by mouth every 6 (six) hours as needed for pain. 02/15/12  Yes Gwyneth Sprout, MD  pravastatin (PRAVACHOL) 40 MG tablet Take 40 mg by mouth Nightly.   Yes Historical Provider, MD   rOPINIRole (REQUIP) 0.25 MG tablet Take 0.25-0.5 mg by mouth 3 (three) times daily. 1 tablet twice a day and 2 tablets at bedtime.   Yes Historical Provider, MD  sennosides-docusate sodium (SENOKOT-S) 8.6-50 MG tablet Take 2 tablets by mouth 2 (two) times daily as needed. For constipation   Yes Historical Provider, MD  vitamin B-12 (CYANOCOBALAMIN) 100 MCG tablet Take 500 mcg by mouth 2 (two) times daily.   Yes Historical Provider, MD   Physical Exam: Filed Vitals:   04/30/12 2330 04/30/12 2345 05/01/12 0015 05/01/12 0106  BP: 128/50 111/70 136/59 123/81  Pulse: 87 95 87 89  Temp:    98 F (36.7 C)  TempSrc:    Oral  Resp: 13 16 15 18   Height:    5' 11.5" (1.816 m)  Weight:    109.453 kg (241 lb 4.8 oz)  SpO2: 100% 100% 100% 100%     General:  Well-developed and nourished.  Eyes: Bilateral blindness.  ENT: No discharge from the ears eyes nose or mouth.  Neck: No mass felt.  Cardiovascular: S1-S2 heard.  Respiratory: No rhonchi or crepitations.  Abdomen: Soft nontender bowel sounds present.  Skin: No rash.  Musculoskeletal: No edema.  Psychiatric: Appears normal.  Neurologic: Alert awake oriented to time place and person. Moves all extremities.  Labs on Admission:  Basic Metabolic Panel:  Recent Labs Lab 04/30/12 1803  NA 134*  K 4.3  CL 91*  CO2 24  GLUCOSE 273*  BUN 44*  CREATININE 7.30*  CALCIUM 9.5   Liver Function Tests:  Recent Labs Lab 04/30/12 1803  AST 15  ALT 14  ALKPHOS 77  BILITOT 0.2*  PROT 7.3  ALBUMIN 3.4*    Recent Labs Lab 04/30/12 1825  LIPASE 43   No results found for this basename: AMMONIA,  in the last 168 hours CBC:  Recent Labs Lab 04/30/12 1803  WBC 15.0*  HGB 11.8*  HCT 35.7*  MCV 93.5  PLT 244   Cardiac Enzymes:  Recent Labs Lab 04/30/12 2115  TROPONINI <0.30    BNP (last 3 results) No results found for this basename: PROBNP,  in the last 8760 hours CBG:  Recent Labs Lab 05/01/12 0004   GLUCAP 219*    Radiological Exams on Admission: Ct Abdomen Pelvis W Contrast  04/30/2012  *RADIOLOGY REPORT*  Clinical Data: Dizziness.  Short of breath.  Dialysis today.  CT ABDOMEN AND PELVIS WITH CONTRAST  Technique:  Multidetector CT imaging of the abdomen and pelvis was performed following the standard protocol during bolus administration of intravenous contrast.  Contrast: OMNIPAQUE IOHEXOL 300 MG/ML  SOLN  Comparison: 06/21/2010 CT.  Findings:  Lung Bases: Dependent atelectasis at the left lung base.  valvular and coronary artery calcification in the heart.  Right gynecomastia incidentally noted.  Old left rib fractures.  Liver:  Normal.  Spleen:  Normal.  Gallbladder:  Partially contracted.  Normal.  Common bile duct:  No calcified stones.  Pancreas:  Mild atrophy.  No mass lesion.  No inflammatory changes.  Adrenal glands:  Normal.  Kidneys:  Bilateral renal atrophy.  Both ureters appear within normal limits.  Stomach:  Mild mural thickening is nonspecific.  This may be due to under distention or associated with chronic gastritis.  Clinically correlate.  Small bowel:  The duodenum appears normal.  Abdominal small bowel and pelvic small bowel appears normal.  No obstruction.  Colon:   Normal appendix.  No inflammatory changes of colon.  The majority of the colon is decompressed with mural thickening associated with decompression.  Pelvic Genitourinary:  Artifact from left hip arthroplasty obscures part of the pelvis.  Urinary bladder shows a tiny amount of fluid and mural thickening which is likely secondary to under distention. The fat containing right inguinal hernia.  Bones:  No aggressive osseous lesions.  Old lumbar compression fractures.  No acute abnormality.  Vasculature: Atherosclerosis.  No acute vascular abnormality.  IMPRESSION: No acute abdominal abnormality.  The colon is decompressed.  Mild wall thickening is favored to be due to nondistension of the colon however mild colitis  could be present in this patient with diarrhea.  Atrophic native kidneys.   Original Report Authenticated By: Andreas Newport, M.D.      Assessment/Plan Active Problems:   * No active hospital problems. *   1. Abdominal pain or diarrhea differentials include infectious colitis versus ischemic - at this time patient is placed on empiric antibiotics and pain medications. Patient will be on clear liquid diet for now. Consult GI in a.m. If patient's pain worsens or patient decompensates will consult surgery. 2. Anemia - probably related to renal disease but patient's stool for occult blood was also positive. Closely follow CBC. 3. Diabetes mellitus type 2 - presently I have placed patient on sliding-scale coverage. Closely follow CBG. 4. ESRD on hemodialysis - please consult nephrology for dialysis. 5. History of cardiomyopathy - presently not short of breath.    Code Status: Full code.  Family Communication: Patient's wife at the bedside.  Disposition Plan: Admit to inpatient.    Johniya Durfee N. [facility] Pager [phone].  If 7PM-7AM, please contact night-coverage [email or web address] Password [email or web address] 05/01/2012, 2:05 AM

## 2012-05-01 NOTE — ED Provider Notes (Signed)
Medical screening examination/treatment/procedure(s) were performed by non-physician practitioner and as supervising physician I was immediately available for consultation/collaboration.  i was available for consult as needed.  Glynn Octave, MD 05/01/12 [phone]

## 2012-05-02 LAB — GLUCOSE, CAPILLARY
Glucose-Capillary: 107 mg/dL — ABNORMAL HIGH (ref 70–99)
Glucose-Capillary: 67 mg/dL — ABNORMAL LOW (ref 70–99)
Glucose-Capillary: 82 mg/dL (ref 70–99)

## 2012-05-02 MED ORDER — CIPROFLOXACIN HCL 500 MG PO TABS: 500.0000 mg | ORAL_TABLET | Freq: Every morning | ORAL | Status: DC

## 2012-05-02 MED ORDER — INSULIN GLARGINE 100 UNIT/ML ~~LOC~~ SOLN: 35.0000 [IU] | Freq: Every day | SUBCUTANEOUS | Status: DC

## 2012-05-02 MED ORDER — METRONIDAZOLE 500 MG PO TABS: 500.0000 mg | ORAL_TABLET | Freq: Three times a day (TID) | ORAL | Status: DC

## 2012-05-02 MED ORDER — CIPROFLOXACIN HCL 500 MG PO TABS: 500.0000 mg | ORAL_TABLET | Freq: Two times a day (BID) | ORAL | Status: DC

## 2012-05-02 MED ORDER — HEPARIN SODIUM (PORCINE) 1000 UNIT/ML DIALYSIS: 9000.0000 [IU] | Freq: Once | INTRAMUSCULAR | Status: DC

## 2012-05-02 NOTE — Progress Notes (Signed)
[facility] Progress Note  Subjective:   Complains of eyes itching. Wife to bring his eye drops but he does not know what they are for, just that he uses them daily. Denies diarrhea today, but cannot recall when he last had a bowel movement. Denies nausea.  Objective Filed Vitals:   05/01/12 1925 05/01/12 2025 05/02/12 0455 05/02/12 1000  BP: 114/62 124/75 94/75 122/77  Pulse: 80 113 99 98  Temp: 98.2 F (36.8 C) 97.5 F (36.4 C) 98.9 F (37.2 C) 98.2 F (36.8 C)  TempSrc: Oral Oral Oral Oral  Resp: 12 16 16 16   Height:      Weight: 111 kg (244 lb 11.4 oz)     SpO2: 100% 95% 98% 98%   Physical Exam General: Resting in bed, NAD Heart: RRR Lungs: CTA bilaterally.  No wheezes, rales or rhonchi appreciated. Poor effort. Abdomen: Obese, NT, normal BS Extremities: No LE edema Dialysis Access: L IJ  Dialysis Orders: Center: [facility] MTTS 3/75 hours Optilfux 180 450/800 EDW 104 2k 2.5 Ca left I-J catheter 9000 heparin Epo 1800 hectorol 2; pt unable to do standing weights  Recent labs: Hgb 10.7 tsat 54 % - Fe just d/c iPTH 307 4/24 Hgb A1C 9.7  Assessment/Plan:  1. Diarrhea/Abdmoninal Pain - Resolved. C. Diff negative, mild colitis on CT. On cipro and flagyl per primary 2. ESRD - TTS [place]. K 4.1. Will place first shift HD orders for tomorrow in case he is still here. Using PC, refuses perm access. 3. Anemia - Hgb 10.9 on OP Epo 1800 u. Last Tsat 54%. Aranesp 12.5 for Sat. 4. Secondary hyperparathyroidism - Continue hectorol, sensipar. 5. HTN/volume - SBPs 90's - 120s. Midodrine pre-HD. Not making op EDW despite HD 4x wk . Try for 4L on Sat. Might need an increase. 6. Nutrition - Diet advanced today. Will need phoslo if still admitted. Continue multivitamin. 7. DM - per primary 8. ASCVD - hx MI/CVA 2006 9. Dispo - Home later today if tolerating PO intake with home health 3 x wk.  [name]. Thad Ranger [facility] Pager [phone] 05/02/2012,12:37  PM  LOS: 2 days   Patient seen and examined, agree with above note with above modifications. Seems at chronically ill baseline- will be discharged later today if tolerates diet.  Denies issue this AM  Annie Sable, MD 05/02/2012     Additional Objective Labs: Basic Metabolic Panel:  Recent Labs Lab 04/30/12 1803 05/01/12 0832  NA 134* 135  K 4.3 4.1  CL 91* 95*  CO2 24 25  GLUCOSE 273* 81  BUN 44* 46*  CREATININE 7.30* 8.03*  CALCIUM 9.5 9.1   Liver Function Tests:  Recent Labs Lab 04/30/12 1803 05/01/12 0832  AST 15 8  ALT 14 11  ALKPHOS 77 74  BILITOT 0.2* 0.3  PROT 7.3 6.9  ALBUMIN 3.4* 3.2*    Recent Labs Lab 04/30/12 1825  LIPASE 43   CBC:  Recent Labs Lab 04/30/12 1803 05/01/12 0832  WBC 15.0* 13.3*  HGB 11.8* 10.9*  HCT 35.7* 33.2*  MCV 93.5 92.5  PLT 244 223   Blood Culture    Component Value Date/Time   SDES BLOOD ARM LEFT 04/30/2012 1850   SPECREQUEST BOTTLES DRAWN AEROBIC AND ANAEROBIC 10CC 04/30/2012 1850   CULT        BLOOD CULTURE RECEIVED NO GROWTH TO DATE CULTURE WILL BE HELD FOR 5 DAYS BEFORE ISSUING A FINAL NEGATIVE REPORT 04/30/2012 1850   REPTSTATUS PENDING 04/30/2012  1850    Cardiac Enzymes:  Recent Labs Lab 04/30/12 2115  TROPONINI <0.30   CBG:  Recent Labs Lab 05/01/12 2021 05/02/12 0007 05/02/12 0454 05/02/12 0750 05/02/12 1147  GLUCAP 103* 107* 82 67* 161*   Studies/Results: Ct Abdomen Pelvis W Contrast  04/30/2012  *RADIOLOGY REPORT*  Clinical Data: Dizziness.  Short of breath.  Dialysis today.  CT ABDOMEN AND PELVIS WITH CONTRAST  Technique:  Multidetector CT imaging of the abdomen and pelvis was performed following the standard protocol during bolus administration of intravenous contrast.  Contrast: OMNIPAQUE IOHEXOL 300 MG/ML  SOLN  Comparison: 06/21/2010 CT.  Findings:  Lung Bases: Dependent atelectasis at the left lung base.  valvular and coronary artery calcification in the heart.  Right  gynecomastia incidentally noted.  Old left rib fractures.  Liver:  Normal.  Spleen:  Normal.  Gallbladder:  Partially contracted.  Normal.  Common bile duct:  No calcified stones.  Pancreas:  Mild atrophy.  No mass lesion.  No inflammatory changes.  Adrenal glands:  Normal.  Kidneys:  Bilateral renal atrophy.  Both ureters appear within normal limits.  Stomach:  Mild mural thickening is nonspecific.  This may be due to under distention or associated with chronic gastritis.  Clinically correlate.  Small bowel:  The duodenum appears normal.  Abdominal small bowel and pelvic small bowel appears normal.  No obstruction.  Colon:   Normal appendix.  No inflammatory changes of colon.  The majority of the colon is decompressed with mural thickening associated with decompression.  Pelvic Genitourinary:  Artifact from left hip arthroplasty obscures part of the pelvis.  Urinary bladder shows a tiny amount of fluid and mural thickening which is likely secondary to under distention. The fat containing right inguinal hernia.  Bones:  No aggressive osseous lesions.  Old lumbar compression fractures.  No acute abnormality.  Vasculature: Atherosclerosis.  No acute vascular abnormality.  IMPRESSION: No acute abdominal abnormality.  The colon is decompressed.  Mild wall thickening is favored to be due to nondistension of the colon however mild colitis could be present in this patient with diarrhea.  Atrophic native kidneys.   Original Report Authenticated By: Andreas Newport, M.D.    Medications:   . cinacalcet  60 mg Oral Q supper  . ciprofloxacin  400 mg Intravenous Q24H  . vitamin B-12  500 mcg Oral BID  . doxercalciferol  2 mcg Intravenous Q T,Th,Sa-HD  . insulin aspart  0-9 Units Subcutaneous TID WC  . insulin glargine  20 Units Subcutaneous QHS  . metronidazole  500 mg Intravenous Q8H  . midodrine  10 mg Oral Q T,Th,Sa-HD  . [START ON 05/05/2012] midodrine  10 mg Oral Q Mon-HD  . multivitamin  1 tablet Oral QHS  .  rOPINIRole  0.25 mg Oral BID  . rOPINIRole  0.5 mg Oral QHS  . simvastatin  5 mg Oral q1800
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
## 2012-05-02 NOTE — Progress Notes (Signed)
   CARE MANAGEMENT NOTE 05/02/2012  Patient:  [name], [name]   Account Number:  [id number]  Date Initiated:  05/02/2012  Documentation initiated by:  Darlyne Russian  Subjective/Objective Assessment:   Patient admitted with AMS, diarrhea.  Prior to admission receiving home health aide 3x/wk / [facility] from the [place].     Action/Plan:   Progression of care and discharge planning   Anticipated DC Date:  05/02/2012   Anticipated DC Plan:  HOME/SELF CARE         Choice offered to / List presented to:             Status of service:  Completed, signed off Medicare Important Message given?   (If response is "NO", the following Medicare IM given date fields will be blank) Date Medicare IM given:   Date Additional Medicare IM given:    Discharge Disposition:  HOME/SELF CARE  Per UR Regulation:    If discussed at Long Length of Stay Meetings, dates discussed:    Comments:  05/02/2012   1200 Darlyne Russian RN, [place]  [phone]  Spoke with patient's spouse regarding home health services prior to admission. [facility].

## 2012-05-02 NOTE — Progress Notes (Signed)
CM referral:  Home health PT Spoke with spouse regarding home health agency and selected [facility]  [facility]/ Lupita Leash called with the referral.

## 2012-05-02 NOTE — Evaluation (Signed)
Physical Therapy Evaluation Patient Details Name: [name] MRN: [id number] DOB: 12/24/1945 Today's Date: 05/02/2012 Time: 2130-8657 PT Time Calculation (min): 17 min  PT Assessment / Plan / Recommendation Clinical Impression  Patient is a 67 yo male admitted with diarrhea, dehydration, AMS.  Patient functions at w/c level with assist from family/aide.  Recommend HHPT at discharge to maximize functional independence.  To be discharged home today.    PT Assessment  All further PT needs can be met in the next venue of care    Follow Up Recommendations  Home health PT;Supervision/Assistance - 24 hour    Does the patient have the potential to tolerate intense rehabilitation      Barriers to Discharge        Equipment Recommendations  None recommended by PT    Recommendations for Other Services     Frequency      Precautions / Restrictions Precautions Precautions: Fall Restrictions Weight Bearing Restrictions: No   Pertinent Vitals/Pain       Mobility  Bed Mobility Bed Mobility: Rolling Left;Left Sidelying to Sit;Sit to Supine Rolling Left: 4: Min guard;With rail Left Sidelying to Sit: 4: Min assist;With rails Sit to Supine: 4: Min guard Details for Bed Mobility Assistance: Verbal cues for safety.  Assist to raise trunk off of bed. Transfers Transfers: Not assessed    Exercises     PT Diagnosis: Difficulty walking;Generalized weakness;Hemiplegia non-dominant side;Altered mental status  PT Problem List: Decreased strength;Decreased activity tolerance;Decreased balance;Decreased mobility;Decreased cognition PT Treatment Interventions:   HHPT  PT Goals  N/A  Visit Information  Last PT Received On: 05/02/12 Assistance Needed: +1    Subjective Data  Subjective: "I could use some more therapy." Patient Stated Goal: Wife - to walk   Prior Functioning  Home Living Lives With: Spouse Available Help at Discharge: Family;Home health (Has aide 2 hours/day for 3  days/week) Type of Home: House Home Adaptive Equipment: Bedside commode/3-in-1;Walker - rolling;Wheelchair - IT trainer Prior Function Level of Independence: Needs assistance Needs Assistance: Bathing;Dressing;Toileting;Meal Prep;Light Housekeeping;Transfers Bath: Maximal (Takes sponge bath) Dressing: Maximal Toileting: Maximal (Uses BSC) Meal Prep: Total Light Housekeeping: Total Transfer Assistance: Mod assist with bed to wheelchair transfers Able to Take Stairs?: No Driving: No Communication Communication: No difficulties    Cognition  Cognition Arousal/Alertness: Awake/alert Behavior During Therapy: WFL for tasks assessed/performed Overall Cognitive Status: History of cognitive impairments - at baseline    Extremity/Trunk Assessment Right Upper Extremity Assessment RUE ROM/Strength/Tone: [facility] for tasks assessed Left Upper Extremity Assessment LUE ROM/Strength/Tone: WFL for tasks assessed Right Lower Extremity Assessment RLE ROM/Strength/Tone: Deficits RLE ROM/Strength/Tone Deficits: Strength 4-/5 Left Lower Extremity Assessment LLE ROM/Strength/Tone: Deficits LLE ROM/Strength/Tone Deficits: Strength 3-/5   Balance Balance Balance Assessed: Yes Static Sitting Balance Static Sitting - Balance Support: No upper extremity supported;Feet supported Static Sitting - Level of Assistance: 5: Stand by assistance Static Sitting - Comment/# of Minutes: 8  End of Session PT - End of Session Activity Tolerance: Patient limited by fatigue Patient left: in bed;with call bell/phone within reach;with family/visitor present Nurse Communication: Mobility status (Need for HHPT)  GP     Vena Austria 05/02/2012, 2:24 PM Durenda Hurt. Renaldo Fiddler, [facility] Pager [phone]

## 2012-05-02 NOTE — Progress Notes (Signed)
05/02/2012 patient alert, oriented, discharge went over with wife and prescriptions were also given. Physical therapy came by and work with patient and recommended home physical therapy. Jewelene Mairena Manufacturing systems engineer.

## 2012-05-06 ENCOUNTER — Telehealth (HOSPITAL_COMMUNITY): Payer: Self-pay | Admitting: Emergency Medicine

## 2012-05-06 LAB — CULTURE, BLOOD (ROUTINE X 2)

## 2012-05-06 NOTE — ED Notes (Signed)
Called by [facility] about positive blood culture in one out of two drawn on 04/30/12. Result called to Jan in the lab at [facility] and then result  was faxed to [facility].

## 2012-05-07 ENCOUNTER — Inpatient Hospital Stay (HOSPITAL_COMMUNITY): Payer: Medicare Other

## 2012-05-07 ENCOUNTER — Other Ambulatory Visit: Payer: Self-pay

## 2012-05-07 ENCOUNTER — Emergency Department (HOSPITAL_COMMUNITY): Payer: Medicare Other

## 2012-05-07 ENCOUNTER — Encounter (HOSPITAL_COMMUNITY): Payer: Self-pay | Admitting: Physical Medicine and Rehabilitation

## 2012-05-07 ENCOUNTER — Inpatient Hospital Stay (HOSPITAL_COMMUNITY)
Admission: EM | Admit: 2012-05-07 | Discharge: 2012-05-11 | DRG: 683 | Disposition: A | Discharge status: Home / Self Care | Payer: Medicare Other | Attending: Internal Medicine | Admitting: Internal Medicine

## 2012-05-07 DIAGNOSIS — I252 Old myocardial infarction: Secondary | ICD-10-CM

## 2012-05-07 DIAGNOSIS — J4489 Other specified chronic obstructive pulmonary disease: Secondary | ICD-10-CM | POA: Diagnosis present

## 2012-05-07 DIAGNOSIS — Z794 Long term (current) use of insulin: Secondary | ICD-10-CM

## 2012-05-07 DIAGNOSIS — R531 Weakness: Secondary | ICD-10-CM | POA: Diagnosis present

## 2012-05-07 DIAGNOSIS — I120 Hypertensive chronic kidney disease with stage 5 chronic kidney disease or end stage renal disease: Secondary | ICD-10-CM | POA: Diagnosis present

## 2012-05-07 DIAGNOSIS — N186 End stage renal disease: Principal | ICD-10-CM

## 2012-05-07 DIAGNOSIS — D649 Anemia, unspecified: Secondary | ICD-10-CM

## 2012-05-07 DIAGNOSIS — D638 Anemia in other chronic diseases classified elsewhere: Secondary | ICD-10-CM | POA: Diagnosis present

## 2012-05-07 DIAGNOSIS — E872 Acidosis, unspecified: Secondary | ICD-10-CM

## 2012-05-07 DIAGNOSIS — R55 Syncope and collapse: Secondary | ICD-10-CM

## 2012-05-07 DIAGNOSIS — Z992 Dependence on renal dialysis: Secondary | ICD-10-CM

## 2012-05-07 DIAGNOSIS — I2510 Atherosclerotic heart disease of native coronary artery without angina pectoris: Secondary | ICD-10-CM | POA: Diagnosis present

## 2012-05-07 DIAGNOSIS — E119 Type 2 diabetes mellitus without complications: Secondary | ICD-10-CM | POA: Diagnosis present

## 2012-05-07 DIAGNOSIS — E785 Hyperlipidemia, unspecified: Secondary | ICD-10-CM | POA: Diagnosis present

## 2012-05-07 DIAGNOSIS — J449 Chronic obstructive pulmonary disease, unspecified: Secondary | ICD-10-CM | POA: Diagnosis present

## 2012-05-07 DIAGNOSIS — R4182 Altered mental status, unspecified: Secondary | ICD-10-CM

## 2012-05-07 DIAGNOSIS — I959 Hypotension, unspecified: Secondary | ICD-10-CM

## 2012-05-07 DIAGNOSIS — Z8673 Personal history of transient ischemic attack (TIA), and cerebral infarction without residual deficits: Secondary | ICD-10-CM

## 2012-05-07 DIAGNOSIS — Z87891 Personal history of nicotine dependence: Secondary | ICD-10-CM

## 2012-05-07 HISTORY — DX: Type 2 diabetes mellitus with other diabetic ophthalmic complication: E11.39

## 2012-05-07 HISTORY — DX: Cerebral infarction, unspecified: I63.9

## 2012-05-07 HISTORY — DX: Unspecified asthma, uncomplicated: J45.909

## 2012-05-07 HISTORY — DX: Unspecified visual loss: H54.7

## 2012-05-07 HISTORY — DX: Gastro-esophageal reflux disease without esophagitis: K21.9

## 2012-05-07 HISTORY — DX: Pneumonia, unspecified organism: J18.9

## 2012-05-07 HISTORY — DX: Essential (primary) hypertension: I10

## 2012-05-07 HISTORY — DX: Bronchiectasis, uncomplicated: J47.9

## 2012-05-07 HISTORY — DX: Type 2 diabetes mellitus without complications: E11.9

## 2012-05-07 HISTORY — DX: Dependence on supplemental oxygen: Z99.81

## 2012-05-07 LAB — COMPREHENSIVE METABOLIC PANEL
AST: 17 U/L (ref 0–37)
CO2: 25 mEq/L (ref 19–32)
Calcium: 9.4 mg/dL (ref 8.4–10.5)
Chloride: 94 mEq/L — ABNORMAL LOW (ref 96–112)
Creatinine, Ser: 6.34 mg/dL — ABNORMAL HIGH (ref 0.50–1.35)
GFR calc Af Amer: 10 mL/min — ABNORMAL LOW (ref >90)
GFR calc non Af Amer: 8 mL/min — ABNORMAL LOW (ref >90)
Glucose, Bld: 126 mg/dL — ABNORMAL HIGH (ref 70–99)
Total Bilirubin: 0.2 mg/dL — ABNORMAL LOW (ref 0.3–1.2)

## 2012-05-07 LAB — CBC WITH DIFFERENTIAL/PLATELET
Basophils Absolute: 0.1 10*3/uL (ref 0.0–0.1)
Eosinophils Relative: 4 % (ref 0–5)
HCT: 33.2 % — ABNORMAL LOW (ref 39.0–52.0)
Hemoglobin: 10.7 g/dL — ABNORMAL LOW (ref 13.0–17.0)
Lymphocytes Relative: 27 % (ref 12–46)
Lymphs Abs: 3.1 10*3/uL (ref 0.7–4.0)
MCV: 95.4 fL (ref 78.0–100.0)
Monocytes Absolute: 1.1 10*3/uL — ABNORMAL HIGH (ref 0.1–1.0)
Monocytes Relative: 10 % (ref 3–12)
Neutro Abs: 6.8 10*3/uL (ref 1.7–7.7)
RBC: 3.48 MIL/uL — ABNORMAL LOW (ref 4.22–5.81)
WBC: 11.6 10*3/uL — ABNORMAL HIGH (ref 4.0–10.5)

## 2012-05-07 LAB — TROPONIN I: Troponin I: <0.3 ng/mL (ref <0.30)

## 2012-05-07 LAB — CBC
HCT: 35.5 % — ABNORMAL LOW (ref 39.0–52.0)
MCH: 30 pg (ref 26.0–34.0)
MCV: 95.2 fL (ref 78.0–100.0)
Platelets: 246 10*3/uL (ref 150–400)
RBC: 3.73 MIL/uL — ABNORMAL LOW (ref 4.22–5.81)

## 2012-05-07 LAB — GLUCOSE, CAPILLARY: Glucose-Capillary: 124 mg/dL — ABNORMAL HIGH (ref 70–99)

## 2012-05-07 LAB — TYPE AND SCREEN

## 2012-05-07 LAB — CG4 I-STAT (LACTIC ACID): Lactic Acid, Venous: 3.36 mmol/L — ABNORMAL HIGH (ref 0.5–2.2)

## 2012-05-07 LAB — PROTIME-INR: Prothrombin Time: 14.5 seconds (ref 11.6–15.2)

## 2012-05-07 LAB — CREATININE, SERUM: GFR calc Af Amer: 9 mL/min — ABNORMAL LOW (ref >90)

## 2012-05-07 MED ORDER — OXYCODONE HCL 5 MG PO TABS
5.0000 mg | ORAL_TABLET | ORAL | Status: DC | PRN
  Administered 2012-05-08 – 2012-05-11 (×10): 5 mg via ORAL
  Filled 2012-05-07 (×10): qty 1

## 2012-05-07 MED ORDER — DEXTROSE 5 % IV SOLN
1.0000 g | INTRAVENOUS | Status: DC
  Filled 2012-05-07: qty 1

## 2012-05-07 MED ORDER — SODIUM CHLORIDE 0.9 % IJ SOLN
3.0000 mL | Freq: Two times a day (BID) | INTRAMUSCULAR | Status: DC
  Administered 2012-05-08 – 2012-05-11 (×5): 3 mL via INTRAVENOUS

## 2012-05-07 MED ORDER — INSULIN ASPART 100 UNIT/ML ~~LOC~~ SOLN
0.0000 [IU] | Freq: Three times a day (TID) | SUBCUTANEOUS | Status: DC
  Administered 2012-05-07: 1 [IU] via SUBCUTANEOUS
  Administered 2012-05-08 (×2): 2 [IU] via SUBCUTANEOUS
  Administered 2012-05-09: 3 [IU] via SUBCUTANEOUS
  Administered 2012-05-09: 4 [IU] via SUBCUTANEOUS

## 2012-05-07 MED ORDER — MIDODRINE HCL 5 MG PO TABS
10.0000 mg | ORAL_TABLET | Freq: Three times a day (TID) | ORAL | Status: DC
Start: 2012-05-08 — End: 2012-05-08
  Administered 2012-05-08 (×2): 10 mg via ORAL
  Filled 2012-05-07 (×4): qty 2

## 2012-05-07 MED ORDER — ACETAMINOPHEN 325 MG PO TABS
650.0000 mg | ORAL_TABLET | Freq: Four times a day (QID) | ORAL | Status: DC | PRN
  Administered 2012-05-08: 650 mg via ORAL
  Filled 2012-05-07: qty 2

## 2012-05-07 MED ORDER — CINACALCET HCL 30 MG PO TABS
30.0000 mg | ORAL_TABLET | Freq: Every day | ORAL | Status: DC
  Administered 2012-05-07 – 2012-05-11 (×5): 30 mg via ORAL
  Filled 2012-05-07 (×5): qty 1

## 2012-05-07 MED ORDER — ONDANSETRON HCL 4 MG PO TABS: 4.0000 mg | ORAL_TABLET | Freq: Four times a day (QID) | ORAL | Status: DC | PRN

## 2012-05-07 MED ORDER — PANTOPRAZOLE SODIUM 40 MG PO TBEC
40.0000 mg | DELAYED_RELEASE_TABLET | Freq: Every day | ORAL | Status: DC
  Administered 2012-05-07 – 2012-05-11 (×5): 40 mg via ORAL
  Filled 2012-05-07 (×5): qty 1

## 2012-05-07 MED ORDER — ONDANSETRON HCL 4 MG/2ML IJ SOLN: 4.0000 mg | Freq: Four times a day (QID) | INTRAMUSCULAR | Status: DC | PRN

## 2012-05-07 MED ORDER — SIMVASTATIN 20 MG PO TABS
20.0000 mg | ORAL_TABLET | Freq: Every day | ORAL | Status: DC
  Administered 2012-05-08 – 2012-05-10 (×3): 20 mg via ORAL
  Filled 2012-05-07 (×4): qty 1

## 2012-05-07 MED ORDER — VANCOMYCIN HCL 10 G IV SOLR
2000.0000 mg | Freq: Once | INTRAVENOUS | Status: AC
  Administered 2012-05-07: 2000 mg via INTRAVENOUS
  Filled 2012-05-07: qty 2000

## 2012-05-07 MED ORDER — LEVOFLOXACIN IN D5W 500 MG/100ML IV SOLN: 500.0000 mg | INTRAVENOUS | Status: DC

## 2012-05-07 MED ORDER — BRIMONIDINE TARTRATE 0.2 % OP SOLN
1.0000 [drp] | Freq: Two times a day (BID) | OPHTHALMIC | Status: DC
  Administered 2012-05-07 – 2012-05-08 (×3): 1 [drp] via OPHTHALMIC
  Filled 2012-05-07: qty 5

## 2012-05-07 MED ORDER — IOHEXOL 300 MG/ML  SOLN
25.0000 mL | INTRAMUSCULAR | Status: AC
  Administered 2012-05-07 (×2): 25 mL via ORAL

## 2012-05-07 MED ORDER — SODIUM CHLORIDE 0.9 % IV BOLUS (SEPSIS)
500.0000 mL | Freq: Once | INTRAVENOUS | Status: AC
  Administered 2012-05-07: 500 mL via INTRAVENOUS

## 2012-05-07 MED ORDER — SODIUM CHLORIDE 0.9 % IJ SOLN
3.0000 mL | Freq: Two times a day (BID) | INTRAMUSCULAR | Status: DC
  Administered 2012-05-08 – 2012-05-11 (×5): 3 mL via INTRAVENOUS

## 2012-05-07 MED ORDER — ACETAMINOPHEN 650 MG RE SUPP: 650.0000 mg | Freq: Four times a day (QID) | RECTAL | Status: DC | PRN

## 2012-05-07 MED ORDER — SODIUM CHLORIDE 0.9 % IV SOLN: 250.0000 mL | INTRAVENOUS | Status: DC | PRN

## 2012-05-07 MED ORDER — DEXTROSE 5 % IV SOLN
2.0000 g | Freq: Once | INTRAVENOUS | Status: AC
  Administered 2012-05-07: 2 g via INTRAVENOUS
  Filled 2012-05-07 (×3): qty 2

## 2012-05-07 MED ORDER — MORPHINE SULFATE 2 MG/ML IJ SOLN: 1.0000 mg | INTRAMUSCULAR | Status: DC | PRN

## 2012-05-07 MED ORDER — IOHEXOL 300 MG/ML  SOLN
100.0000 mL | Freq: Once | INTRAMUSCULAR | Status: AC | PRN
  Administered 2012-05-07: 100 mL via INTRAVENOUS

## 2012-05-07 MED ORDER — VITAMIN B-12 1000 MCG PO TABS
1000.0000 ug | ORAL_TABLET | Freq: Every day | ORAL | Status: DC
  Administered 2012-05-07 – 2012-05-11 (×5): 1000 ug via ORAL
  Filled 2012-05-07 (×5): qty 1

## 2012-05-07 MED ORDER — POLYVINYL ALCOHOL 1.4 % OP SOLN
1.0000 [drp] | OPHTHALMIC | Status: DC | PRN
  Administered 2012-05-07 – 2012-05-08 (×4): 1 [drp] via OPHTHALMIC
  Filled 2012-05-07: qty 15

## 2012-05-07 MED ORDER — LEVOFLOXACIN IN D5W 750 MG/150ML IV SOLN
750.0000 mg | Freq: Once | INTRAVENOUS | Status: AC
  Administered 2012-05-07: 750 mg via INTRAVENOUS
  Filled 2012-05-07 (×2): qty 150

## 2012-05-07 MED ORDER — ARTIFICIAL TEARS OP OINT
1.0000 "application " | TOPICAL_OINTMENT | Freq: Two times a day (BID) | OPHTHALMIC | Status: DC
  Administered 2012-05-08 (×2): 1 via OPHTHALMIC
  Filled 2012-05-07 (×2): qty 3.5

## 2012-05-07 MED ORDER — HEPARIN SODIUM (PORCINE) 5000 UNIT/ML IJ SOLN
5000.0000 [IU] | Freq: Three times a day (TID) | INTRAMUSCULAR | Status: DC
  Administered 2012-05-07 – 2012-05-08 (×2): 5000 [IU] via SUBCUTANEOUS
  Filled 2012-05-07 (×5): qty 1

## 2012-05-07 MED ORDER — GABAPENTIN 300 MG PO CAPS
300.0000 mg | ORAL_CAPSULE | Freq: Every evening | ORAL | Status: DC | PRN
  Filled 2012-05-07: qty 1

## 2012-05-07 MED ORDER — SODIUM CHLORIDE 0.9 % IJ SOLN: 3.0000 mL | INTRAMUSCULAR | Status: DC | PRN

## 2012-05-07 MED ORDER — CARBOXYMETHYLCELLULOSE SODIUM 1 % OP SOLN: 1.0000 [drp] | OPHTHALMIC | Status: DC

## 2012-05-07 NOTE — H&P (Signed)
PCP:   [name], MD   Chief Complaint:  Weakness, lethargy, altered mental status.   HPI: This is a 67 year old male, with known history of ESRD on HD M-T-T-S, CHF, DM-2, CAD, s/p previous MI, COPD, dyslipidemia, s/p CVA, blindness, chronic hypotension on Midodrine, here for hypotension discovered by home health nurse today. According to patient and his spouse, patient completed HD at about 10:30 AM on 05/06/12, after which he became quite lethargic. He got home at about noon, and was very weak and somewhat confused all day. When [facility] came by at 11:00 AM today, patient was disoriented, and was found to have an exeedingly low BP. EMS was called, and per spouse, patient actually passed out, when he was placed in a wheelchair, to be placed in the ambulance. Per spouse, his CBG was about 126 at that time. Patient has had no antecedent SOB, chest pain, cough, fever, chills, vomiting or diarrhea. Per spouse, patient has been gradually declining over several weeks to months, and has fallen at least 3 times in the past one week. In the ED, SBP was found to be in the 60s, but normalized after an iv bolus of 250 ml NS.    Allergies:   Allergies  Allergen Reactions  . Cefepime Itching  . Penicillins Hives and Swelling      Past Medical History  Diagnosis Date  . CHF (congestive heart failure)   . Diabetes mellitus     etiology of ESRD  . Blind t  . CVA (cerebral infarction) 2006  . ESRD (end stage renal disease) on dialysis started 08/2006    MTTS [place]  . CAD (coronary artery disease) 2006  . MI, old 2006  . COPD (chronic obstructive pulmonary disease)   . Hyperlipidemia     Past Surgical History  Procedure Laterality Date  . Appendectomy    . Av fitula      Prior to Admission medications   Medication Sig Start Date End Date Taking? Authorizing Provider  artificial tears (LACRILUBE) OINT ophthalmic ointment Place 1 application into the right eye 2 (two) times daily.   Yes  Historical Provider, MD  aspirin 325 MG tablet Take 325 mg by mouth daily.   Yes Historical Provider, MD  brimonidine (ALPHAGAN) 0.2 % ophthalmic solution Place 1 drop into both eyes 2 (two) times daily.   Yes Historical Provider, MD  calcium acetate (PHOSLO) 667 MG capsule Take 4 capsules (2,668 mg total) by mouth 3 (three) times daily with meals. 11/04/11  Yes Joseph Art, DO  carboxymethylcellulose (REFRESH CELLUVISC) 1 % ophthalmic solution Place 1 drop into both eyes every 2 (two) hours.   Yes Historical Provider, MD  cinacalcet (SENSIPAR) 30 MG tablet Take 30 mg by mouth daily.   Yes Historical Provider, MD  ciprofloxacin (CIPRO) 500 MG tablet Take 500 mg by mouth every morning. For 7 days; Start date 05/02/12 05/02/12 05/09/12 Yes Richarda Overlie, MD  cyanocobalamin 500 MCG tablet Take 1,000 mcg by mouth daily.   Yes Historical Provider, MD  folic acid-vitamin b complex-vitamin c-selenium-zinc (DIALYVITE) 3 MG TABS Take 1 tablet by mouth daily.   Yes Historical Provider, MD  gabapentin (NEURONTIN) 100 MG capsule Take 300 mg by mouth at bedtime as needed (nerve pain).   Yes Historical Provider, MD  insulin aspart (NOVOLOG) 100 UNIT/ML injection Inject 26 Units into the skin 3 (three) times daily before meals.    Yes Historical Provider, MD  insulin glargine (LANTUS) 100 UNIT/ML injection Inject 60  Units into the skin at bedtime. 05/02/12  Yes Richarda Overlie, MD  metroNIDAZOLE (FLAGYL) 500 MG tablet Take 500 mg by mouth 3 (three) times daily. Duration unknown 05/02/12 05/09/12 Yes Richarda Overlie, MD  midodrine (PROAMATINE) 10 MG tablet Take 1 tablet (10 mg total) by mouth Every Tuesday,Thursday,and Saturday with dialysis. 12/12/11  Yes Lesle  Black, NP  omeprazole (PRILOSEC) 20 MG capsule Take 20 mg by mouth 2 (two) times daily.    Yes Historical Provider, MD  oxycodone (OXY-IR) 5 MG capsule Take 10 mg by mouth every 6 (six) hours as needed for pain.    Yes Historical Provider, MD  pravastatin (PRAVACHOL) 40 MG  tablet Take 40 mg by mouth at bedtime.    Yes Historical Provider, MD  rOPINIRole (REQUIP) 0.25 MG tablet Take 0.25-0.5 mg by mouth 3 (three) times daily. 1 tablet twice a day and 2 tablets at bedtime.   Yes Historical Provider, MD  sennosides-docusate sodium (SENOKOT-S) 8.6-50 MG tablet Take 1 tablet by mouth 2 (two) times daily as needed for constipation. For constipation   Yes Historical Provider, MD    Social History:  Patient reports that he has quit smoking. He does not have any smokeless tobacco history on file. He reports that he does not drink alcohol or use illicit drugs.  Family History  Problem Relation Age of Onset  . Diabetes Mellitus II Father     Review of Systems:  As per HPI and chief complaint. Patent denies fatigue, diminished appetite, weight loss, fever, chills, headache, blurred vision, difficulty in speaking, dysphagia, chest pain, cough, shortness of breath, orthopnea, paroxysmal nocturnal dyspnea, nausea, diaphoresis. He has central abdominal pain/discomfort, but no vomiting, diarrhea, belching, heartburn, hematemesis, melena, dysuria, nocturia, urinary frequency, hematochezia, lower extremity swelling, pain, or redness. The rest of the systems review is negative.  Physical Exam:  General:  Patient does not appear to be in obvious acute distress. Alert, communicative, fully oriented, talking in complete sentences, not short of breath at rest.  HEENT:  Mild clinical pallor, no jaundice, no conjunctival injection or discharge. NECK:  Supple, JVP not seen, no carotid bruits, no palpable lymphadenopathy, no palpable goiter. CHEST:  Clinically clear to auscultation, no wheezes, no crackles. HEART:  Sounds 1 and 2 heard, normal, regular, no murmurs. ABDOMEN:  Obese, soft, no scars, mild peri-umbilical tenderness to deep palpation, no guarding or rebound, no palpable organomegaly, no palpable masses, normal bowel sounds. GENITALIA:  Not examined. LOWER EXTREMITIES:  No  pitting edema, palpable peripheral pulses. MUSCULOSKELETAL SYSTEM:  Generalized osteoarthritic changes, otherwise, normal. Patient has several healing abrasions on both shins, which appear uninfected.  CENTRAL NERVOUS SYSTEM:  No focal neurologic deficit on gross examination.  Labs on Admission:  Results for orders placed during the hospital encounter of 05/07/12 (from the past 48 hour(s))  GLUCOSE, CAPILLARY     Status: Abnormal   Collection Time    05/07/12  2:55 PM      Result Value Range   Glucose-Capillary 126 (*) 70 - 99 mg/dL   Comment 1 Documented in Chart     Comment 2 Notify RN    GLUCOSE, CAPILLARY     Status: Abnormal   Collection Time    05/07/12  3:04 PM      Result Value Range   Glucose-Capillary 139 (*) 70 - 99 mg/dL  CBC WITH DIFFERENTIAL     Status: Abnormal   Collection Time    05/07/12  3:18 PM      Result  Value Range   WBC 11.6 (*) 4.0 - 10.5 K/uL   RBC 3.48 (*) 4.22 - 5.81 MIL/uL   Hemoglobin 10.7 (*) 13.0 - 17.0 g/dL   HCT 16.1 (*) 09.6 - 04.5 %   MCV 95.4  78.0 - 100.0 fL   MCH 30.7  26.0 - 34.0 pg   MCHC 32.2  30.0 - 36.0 g/dL   RDW 40.9  81.1 - 91.4 %   Platelets 266  150 - 400 K/uL   Neutrophils Relative 59  43 - 77 %   Neutro Abs 6.8  1.7 - 7.7 K/uL   Lymphocytes Relative 27  12 - 46 %   Lymphs Abs 3.1  0.7 - 4.0 K/uL   Monocytes Relative 10  3 - 12 %   Monocytes Absolute 1.1 (*) 0.1 - 1.0 K/uL   Eosinophils Relative 4  0 - 5 %   Eosinophils Absolute 0.4  0.0 - 0.7 K/uL   Basophils Relative 1  0 - 1 %   Basophils Absolute 0.1  0.0 - 0.1 K/uL  COMPREHENSIVE METABOLIC PANEL     Status: Abnormal   Collection Time    05/07/12  3:18 PM      Result Value Range   Sodium 135  135 - 145 mEq/L   Potassium 3.8  3.5 - 5.1 mEq/L   Chloride 94 (*) 96 - 112 mEq/L   CO2 25  19 - 32 mEq/L   Glucose, Bld 126 (*) 70 - 99 mg/dL   BUN 38 (*) 6 - 23 mg/dL   Creatinine, Ser 7.82 (*) 0.50 - 1.35 mg/dL   Calcium 9.4  8.4 - 95.6 mg/dL   Total Protein 6.6  6.0 -  8.3 g/dL   Albumin 3.1 (*) 3.5 - 5.2 g/dL   AST 17  0 - 37 U/L   ALT 16  0 - 53 U/L   Alkaline Phosphatase 67  39 - 117 U/L   Total Bilirubin 0.2 (*) 0.3 - 1.2 mg/dL   GFR calc non Af Amer 8 (*) >90 mL/min   GFR calc Af Amer 10 (*) >90 mL/min   Comment:            The eGFR has been calculated     using the CKD EPI equation.     This calculation has not been     validated in all clinical     situations.     eGFR's persistently     <90 mL/min signify     possible Chronic Kidney Disease.  TROPONIN I     Status: None   Collection Time    05/07/12  3:19 PM      Result Value Range   Troponin I <0.30  <0.30 ng/mL   Comment:            Due to the release kinetics of cTnI,     a negative result within the first hours     of the onset of symptoms does not rule out     myocardial infarction with certainty.     If myocardial infarction is still suspected,     repeat the test at appropriate intervals.  PROCALCITONIN     Status: None   Collection Time    05/07/12  3:33 PM      Result Value Range   Procalcitonin 2.67     Comment:            Interpretation:     PCT > 2  ng/mL:     Systemic infection (sepsis) is likely,     unless other causes are known.     (NOTE)             ICU PCT Algorithm               Non ICU PCT Algorithm        ----------------------------     ------------------------------             PCT < 0.25 ng/mL                 PCT < 0.1 ng/mL         Stopping of antibiotics            Stopping of antibiotics           strongly encouraged.               strongly encouraged.        ----------------------------     ------------------------------           PCT level decrease by               PCT < 0.25 ng/mL           >= 80% from peak PCT           OR PCT 0.25 - 0.5 ng/mL          Stopping of antibiotics                                                 encouraged.         Stopping of antibiotics               encouraged.        ----------------------------      ------------------------------           PCT level decrease by              PCT >= 0.25 ng/mL           < 80% from peak PCT            AND PCT >= 0.5 ng/mL            Continuing antibiotics                                                  encouraged.           Continuing antibiotics                encouraged.        ----------------------------     ------------------------------         PCT level increase compared          PCT > 0.5 ng/mL             with peak PCT AND              PCT >= 0.5 ng/mL             Escalation of antibiotics  strongly encouraged.          Escalation of antibiotics            strongly encouraged.  PROTIME-INR     Status: None   Collection Time    05/07/12  3:33 PM      Result Value Range   Prothrombin Time 14.5  11.6 - 15.2 seconds   INR 1.15  0.00 - 1.49  TYPE AND SCREEN     Status: None   Collection Time    05/07/12  3:41 PM      Result Value Range   ABO/RH(D) A POS     Antibody Screen NEG     Sample Expiration 05/10/2012    ABO/RH     Status: None   Collection Time    05/07/12  3:41 PM      Result Value Range   ABO/RH(D) A POS    CG4 I-STAT (LACTIC ACID)     Status: Abnormal   Collection Time    05/07/12  4:07 PM      Result Value Range   Lactic Acid, Venous 3.36 (*) 0.5 - 2.2 mmol/L    Radiological Exams on Admission: Ct Head Wo Contrast  05/07/2012  *RADIOLOGY REPORT*  Clinical Data: Altered mental status  CT HEAD WITHOUT CONTRAST  Technique:  Contiguous axial images were obtained from the base of the skull through the vertex without contrast.  Comparison: 12/11/2011  Findings: No skull fracture is noted.  There is mild mucosal thickening right sphenoid sinus.  The mastoid air cells are unremarkable.  No intracranial hemorrhage, mass effect or midline shift.  Stable cerebral atrophy.  Stable periventricular and patchy subcortical chronic white matter disease.  No acute infarction.  No mass lesion is  noted on this unenhanced scan.  IMPRESSION: No acute intracranial abnormality.  Stable atrophy and chronic white matter disease.   Original Report Authenticated By: Natasha Mead, M.D.    Dg Chest Port 1 View  05/07/2012  *RADIOLOGY REPORT*  Clinical Data: Altered mental status, dialysis patient, history of COPD and CHF  PORTABLE CHEST - 1 VIEW  Comparison: 02/14/2012; 01/01/2012  Findings:  Grossly unchanged enlarged cardiac silhouette and mediastinal contours with apparent differences attributable to decreased lung volumes and AP projection. Stable positioning of support apparatus. The pulmonary vasculature is less distinct on the present examination with cephalization of flow.  No focal airspace opacities.  No definite pleural effusion or pneumothorax. Unchanged bones.  IMPRESSION: Suspected mild pulmonary edema on this hypoventilated AP supine examination.   Original Report Authenticated By: Tacey Ruiz, MD     Assessment/Plan Active Problems:    1. Hypotension: Patient has a known history of recurrent hypotension, and is on Midodrine, administered before HD, prn. He has presented with profound hypotension, SBP reportedly in the 60s, which fortunately has responded to iv saline bolus, and is now 110/69. At the time of my evaluation. Patient clinically, does not look septic, has no pyrexia, although wcc is borderline elevated at 11.6, and lactic acid is 3.36. ED MD has empirically commnenced broad spectrum antibiotic therapy with vancomycin/Aztreonam/Levaquin, per sepsis protocol, and I concur with this. Will carry out septic work up, with blood cultures. CXR is negative for pneumonic consolidation. Meanwhile, will check cortisol levels, and place patient on scheduled Midodrine and cycle cardiac enzymes, arrange 2D echocardiogram, to evaluate for pericarditis.  2. AMS/ Weakness generalized/Falls: Patient was reportedly lethargic, confused and disoriented, at home. Following improvement of BP, mental  status has dramatically  improved. Head CT scan is negative for acute findings, and patient has no focal neurologic deficit on physical examination. Likely, profound hypotension was the culprit, although his gradual decline may be due to ESRD. Patient per spouse, has had recurrent falls, which may be attributable to orthostasis and debility. Will consult PT/OT.  3. Abdominal pain: This is mild, central and unassociated with marked tenderness, guarding or rebound. Will check lipase for completeness. Meanwhile, arrange abdominal U/S/CT scan.  4. Diabetes mellitus: Insulin-requiring type 2. Random blood glucose is reasonable at 126. Will manage with SSI for now.  5. CAD (coronary artery disease): Patient has no chest pain or SOB. Will monitor telemetrically and cycle cardiac enzymes.  6. ESRD on HD: Patient is on HD M-T-T-S. Will notify renal team of hospitalization, so that appropriate HD schedule can be reinstated.   Further management will depend on clinical course.   Comment: Patient is FULL CODE.    Time Spent on Admission: 1 hour.   Haliegh Khurana,CHRISTOPHER 05/07/2012, 6:25 PM

## 2012-05-07 NOTE — ED Notes (Signed)
Pt presents to department for evaluation of altered mental status. Wife states he was lethargic and confused today, more than normal. Upon arrival pt responding to pain, but difficult to arouse. Able to answer questions appropriately when stimulated. He is hemodialysis patient. Denies pain.

## 2012-05-07 NOTE — ED Provider Notes (Signed)
Pt has history of ESRD.  Pt apparently left dialysis yesterday lethargic per the wife.  The symptoms persisted today.  She called 911 and they noted him to be extremely hypotensive.  Pt denies complaints of chest pain, abdominal pain.  No fevers.  Pt appears ill.  Hypotension is obviously very concerning.  ?sepsis, cardiogenic shock.  Will start IV fluid resuscitation, proceed with workup.  He does have a subclavian dialysis catheter left anterior chest.  No erythema or discharge.  Will consult with nephrology regarding.  Start empiric iv abx while awaiting results.  Initiated code sepsis.  I saw and evaluated the patient, reviewed the resident's note and I agree with the findings and plan. I reviewed and interpreted the EKG during the patient's evaluation in the ED and agree with the resident's interpretation.   Celene Kras, MD 05/07/12 [phone]

## 2012-05-07 NOTE — Progress Notes (Signed)
ANTIBIOTIC CONSULT NOTE - INITIAL  Pharmacy Consult for Vancomycin/Aztreonam/Levaquin Indication: rule out sepsis  Allergies  Allergen Reactions  . Cefepime Itching  . Penicillins Hives and Swelling    Patient Measurements:   Wt ~ 241 lbs (110kg)  Vital Signs: Temp: 98.1 F (36.7 C) (05/07 1500) Temp src: Oral (05/07 1500) BP: 105/62 mmHg (05/07 1545) Pulse Rate: 78 (05/07 1545) Intake/Output from previous day:   Intake/Output from this shift: Total I/O In: 500 [I.V.:500] Out: -   Labs: No results found for this basename: WBC, HGB, PLT, LABCREA, CREATININE,  in the last 72 hours The CrCl is unknown because both a height and weight (above a minimum accepted value) are required for this calculation. No results found for this basename: VANCOTROUGH, Leodis Binet, VANCORANDOM, GENTTROUGH, GENTPEAK, GENTRANDOM, TOBRATROUGH, TOBRAPEAK, TOBRARND, AMIKACINPEAK, AMIKACINTROU, AMIKACIN,  in the last 72 hours   Microbiology: Recent Results (from the past 720 hour(s))  CULTURE, BLOOD (ROUTINE X 2)     Status: None   Collection Time    04/30/12  6:40 PM      Result Value Range Status   Specimen Description BLOOD ARM LEFT   Final   Special Requests BOTTLES DRAWN AEROBIC AND ANAEROBIC 10CC   Final   Culture  Setup Time 04/30/2012 22:52   Final   Culture STAPHYLOCOCCUS SPECIES (COAGULASE NEGATIVE)   Final   Report Status PENDING   Incomplete  CULTURE, BLOOD (ROUTINE X 2)     Status: None   Collection Time    04/30/12  6:50 PM      Result Value Range Status   Specimen Description BLOOD ARM LEFT   Final   Special Requests BOTTLES DRAWN AEROBIC AND ANAEROBIC 10CC   Final   Culture  Setup Time 04/30/2012 22:52   Final   Culture NO GROWTH 5 DAYS   Final   Report Status 05/06/2012 FINAL   Final  CLOSTRIDIUM DIFFICILE BY PCR     Status: None   Collection Time    04/30/12  7:08 PM      Result Value Range Status   C difficile by pcr NEGATIVE  NEGATIVE Final    Medical History: Past  Medical History  Diagnosis Date  . CHF (congestive heart failure)   . Diabetes mellitus     etiology of ESRD  . Blind t  . CVA (cerebral infarction) 2006  . ESRD (end stage renal disease) on dialysis started 08/2006    MTTS   . CAD (coronary artery disease) 2006  . MI, old 2006  . COPD (chronic obstructive pulmonary disease)   . Hyperlipidemia     Medications:  PTA Antibiotics: Cipro and Flagyl  Assessment:  CODE SEPSIS   67 y/o ESRD pt on TTS HD who left HD yesterday lethargic per wife. Hypotension concerning, Source?, has subclavian HD cath per MD note. WBC 13.3, BP slightly better now after fluids. To start broad spectrum antibiotics.   Goal of Therapy:  Pre-HD vancomycin level 15-25 mg/L   Plan:   -Vancomycin 2g IV x 1 already ordered, then continue Vancomycin 1000mg  IV post-HD sessions (will not order, will f/u HD schedule/tolerability and enter orders)  -Aztreonam 2g IV x 1 already ordered, then continue Aztreonam 1g IV q24h (after HD on HD days)  -Levaquin 750mg  IV x 1 already ordered, then continue Levaquin 500mg  IV q48h  -F/U cultures, HD schedule, LOT  Abran Duke, PharmD Clinical Pharmacist Phone: [phone] Pager: [phone] 05/07/2012 4:09 PM

## 2012-05-07 NOTE — ED Notes (Signed)
EDP at the bedside. Aware of hypotension. NS fluid bolus infusing at the time.

## 2012-05-07 NOTE — ED Provider Notes (Signed)
History     CSN: [id number]  Arrival date & time 05/07/12  1453   None     Chief Complaint  Patient presents with  . Altered Mental Status    (Consider location/radiation/quality/duration/timing/severity/associated sxs/prior treatment) HPI Comments: 22 y M with PMH of ESRD on MTTS HD, IDDM, COPD (no O2), CAD, CVA and HLD here for hypotension discovered by home health nurse today.  His wife indicates that he was more confused and fatigued after dialysis yesterday, but that there has otherwise been no significant change recently.  She instead indicates that he has been on a steady decline over the past weeks/months.  Pt denies any complaints of pain on questioning.  The wife denies any fevers, diarrhea, vomiting and does not report any pt complaints at home including CP/SOB/abd pain/HA/numbness/weakness.  Patient is a 67 y.o. male presenting with altered mental status. The history is provided by the spouse and the patient.  Altered Mental Status This is a new problem. The current episode started more than 1 month ago. The problem occurs constantly. The problem has been gradually worsening. Pertinent negatives include no abdominal pain, chest pain, fever, headaches or vomiting. Treatments tried: midodrine.    Past Medical History  Diagnosis Date  . CHF (congestive heart failure)   . Diabetes mellitus     etiology of ESRD  . Blind t  . CVA (cerebral infarction) 2006  . ESRD (end stage renal disease) on dialysis started 08/2006    MTTS [place]  . CAD (coronary artery disease) 2006  . MI, old 2006  . COPD (chronic obstructive pulmonary disease)   . Hyperlipidemia     Past Surgical History  Procedure Laterality Date  . Appendectomy    . Av fitula      Family History  Problem Relation Age of Onset  . Diabetes Mellitus II Father     History  Substance Use Topics  . Smoking status: Former Games developer  . Smokeless tobacco: Not on file  . Alcohol Use: No      Review of Systems   Unable to perform ROS: Mental status change  Constitutional: Negative for fever.  Cardiovascular: Negative for chest pain.  Gastrointestinal: Negative for vomiting and abdominal pain.  Neurological: Negative for headaches.  Psychiatric/Behavioral: Positive for altered mental status.    Allergies  Cefepime and Penicillins  Home Medications   Current Outpatient Rx  Name  Route  Sig  Dispense  Refill  . aspirin 325 MG tablet   Oral   Take 325 mg by mouth daily.         . calcium acetate (PHOSLO) 667 MG capsule   Oral   Take 4 capsules (2,668 mg total) by mouth 3 (three) times daily with meals.   360 capsule   0   . cinacalcet (SENSIPAR) 60 MG tablet   Oral   Take 60 mg by mouth daily.          . ciprofloxacin (CIPRO) 500 MG tablet   Oral   Take 1 tablet (500 mg total) by mouth every morning.   7 tablet   0   . darbepoetin (ARANESP) 40 MCG/0.4ML SOLN   Intravenous   Inject 0.4 mLs (40 mcg total) into the vein every Thursday with hemodialysis.   8.4 mL      . folic acid-vitamin b complex-vitamin c-selenium-zinc (DIALYVITE) 3 MG TABS   Oral   Take 1 tablet by mouth daily.         . insulin aspart (  NOVOLOG) 100 UNIT/ML injection   Subcutaneous   Inject 20-40 Units into the skin 3 (three) times daily before meals. Sliding scale         . insulin glargine (LANTUS) 100 UNIT/ML injection   Subcutaneous   Inject 0.35 mLs (35 Units total) into the skin at bedtime.   10 mL   0   . metroNIDAZOLE (FLAGYL) 500 MG tablet   Oral   Take 1 tablet (500 mg total) by mouth 3 (three) times daily.   25 tablet   0   . midodrine (PROAMATINE) 10 MG tablet   Oral   Take 1 tablet (10 mg total) by mouth Every Tuesday,Thursday,and Saturday with dialysis.   30 tablet   0   . multivitamin (RENA-VIT) TABS tablet   Oral   Take 1 tablet by mouth at bedtime.   30 tablet   0   . omeprazole (PRILOSEC) 20 MG capsule   Oral   Take 20 mg by mouth 2 (two) times daily.           Marland Kitchen oxycodone (OXY-IR) 5 MG capsule   Oral   Take 10 mg by mouth every 6 (six) hours as needed. For pain         . oxyCODONE-acetaminophen (PERCOCET) 10-325 MG per tablet   Oral   Take 1 tablet by mouth every 6 (six) hours as needed for pain.   5 tablet   0   . pravastatin (PRAVACHOL) 40 MG tablet   Oral   Take 40 mg by mouth Nightly.         Marland Kitchen rOPINIRole (REQUIP) 0.25 MG tablet   Oral   Take 0.25-0.5 mg by mouth 3 (three) times daily. 1 tablet twice a day and 2 tablets at bedtime.         . sennosides-docusate sodium (SENOKOT-S) 8.6-50 MG tablet   Oral   Take 2 tablets by mouth 2 (two) times daily as needed. For constipation         . vitamin B-12 (CYANOCOBALAMIN) 100 MCG tablet   Oral   Take 500 mcg by mouth 2 (two) times daily.           BP 62/30  Pulse 89  Temp(Src) 98.1 F (36.7 C) (Oral)  Resp 24  SpO2 98%  Physical Exam  Vitals reviewed. Constitutional: He is oriented to person, place, and time. He appears well-developed and well-nourished. No distress.  somnolent but easily aroused  HENT:  Head: Normocephalic.  Right Ear: External ear normal.  Left Ear: External ear normal.  Nose: Nose normal.  Mouth/Throat: Oropharynx is clear and moist. No oropharyngeal exudate.  Eyes: Conjunctivae are normal. Pupils are equal, round, and reactive to light.  Neck: Normal range of motion. Neck supple.  Cardiovascular: Normal rate, regular rhythm, normal heart sounds and intact distal pulses.  Exam reveals no gallop and no friction rub.   No murmur heard. Pulmonary/Chest: Effort normal and breath sounds normal.  Abdominal: Soft. Bowel sounds are normal. He exhibits distension. There is no tenderness. There is no rebound and no guarding.  Musculoskeletal: Normal range of motion. He exhibits no edema and no tenderness.  Neurological: He is oriented to person, place, and time. No cranial nerve deficit.  Somnolent but easily aroused Moving all four extremities   Skin: Skin is warm and dry. He is not diaphoretic.  Psychiatric: He has a normal mood and affect.    ED Course  Procedures (including critical care time)  Labs Reviewed  GLUCOSE, CAPILLARY - Abnormal; Notable for the following:    Glucose-Capillary 126 (*)    All other components within normal limits  GLUCOSE, CAPILLARY - Abnormal; Notable for the following:    Glucose-Capillary 139 (*)    All other components within normal limits  CBC WITH DIFFERENTIAL - Abnormal; Notable for the following:    WBC 11.6 (*)    RBC 3.48 (*)    Hemoglobin 10.7 (*)    HCT 33.2 (*)    Monocytes Absolute 1.1 (*)    All other components within normal limits  COMPREHENSIVE METABOLIC PANEL - Abnormal; Notable for the following:    Chloride 94 (*)    Glucose, Bld 126 (*)    BUN 38 (*)    Creatinine, Ser 6.34 (*)    Albumin 3.1 (*)    Total Bilirubin 0.2 (*)    GFR calc non Af Amer 8 (*)    GFR calc Af Amer 10 (*)    All other components within normal limits  CG4 I-STAT (LACTIC ACID) - Abnormal; Notable for the following:    Lactic Acid, Venous 3.36 (*)    All other components within normal limits  CULTURE, BLOOD (ROUTINE X 2)  CULTURE, BLOOD (ROUTINE X 2)  TROPONIN I  PROTIME-INR  PROCALCITONIN  TYPE AND SCREEN  ABO/RH   Ct Head Wo Contrast  05/07/2012  *RADIOLOGY REPORT*  Clinical Data: Altered mental status  CT HEAD WITHOUT CONTRAST  Technique:  Contiguous axial images were obtained from the base of the skull through the vertex without contrast.  Comparison: 12/11/2011  Findings: No skull fracture is noted.  There is mild mucosal thickening right sphenoid sinus.  The mastoid air cells are unremarkable.  No intracranial hemorrhage, mass effect or midline shift.  Stable cerebral atrophy.  Stable periventricular and patchy subcortical chronic white matter disease.  No acute infarction.  No mass lesion is noted on this unenhanced scan.  IMPRESSION: No acute intracranial abnormality.  Stable atrophy  and chronic white matter disease.   Original Report Authenticated By: Natasha Mead, M.D.    Dg Chest Port 1 View  05/07/2012  *RADIOLOGY REPORT*  Clinical Data: Altered mental status, dialysis patient, history of COPD and CHF  PORTABLE CHEST - 1 VIEW  Comparison: 02/14/2012; 01/01/2012  Findings:  Grossly unchanged enlarged cardiac silhouette and mediastinal contours with apparent differences attributable to decreased lung volumes and AP projection. Stable positioning of support apparatus. The pulmonary vasculature is less distinct on the present examination with cephalization of flow.  No focal airspace opacities.  No definite pleural effusion or pneumothorax. Unchanged bones.  IMPRESSION: Suspected mild pulmonary edema on this hypoventilated AP supine examination.   Original Report Authenticated By: Tacey Ruiz, MD      Date: 05/07/2012  Rate: 84  Rhythm: normal sinus rhythm  QRS Axis: right  Intervals: PR prolonged and QT prolonged  ST/T Wave abnormalities: nonspecific ST/T changes  Conduction Disutrbances:first-degree A-V block , right bundle branch block and left posterior fascicular block  Narrative Interpretation: NSR, 1st degree HB, RBBB and LPFB, QTc prolonged, new RBBB when compared to EKG 04/30/12  Old EKG Reviewed: changes noted    1. Hypotension   2. Lactic acidosis   3. Altered mental status   4. ESRD (end stage renal disease) on dialysis       MDM   26 y M with PMH of ESRD on MTTS HD, IDDM, COPD (no O2), CAD, CVA and HLD here for hypotension discovered by home  health nurse today.  His wife indicates that he was more confused and fatigued after dialysis yesterday, but that there has otherwise been no significant change recently.  She instead indicates that he has been on a steady decline over the past weeks/months.  Pt denies any complaints of pain on questioning.  The wife denies any fevers, diarrhea, vomiting and does not report any pt complaints at home including CP/SOB/abd  pain/HA/numbness/weakness.  Afebrile and hypotensive on arrival.  HR in 80s.  Fully oriented but somewhat somnolent.  Easily arounsed.  Abd soft, NT, distended.  Lungs clear.  Moving all four extremities equally.  Diff Dx: Septic Shock, Hemorrhagic Shock, Hypotension 2/2 dialysis, ACS  Will call Code Sepsis and tx empirically (vanc/aztreonam/levoflox) for hypotension of unknown etiology.  Bedside U/S.  CXR, CBC, CMP, trop, lactate, Pro-C, blood cx.  Pt is anuric so no urine studies indicated.  500 cc bolus  3:34 PM Dr Allena Katz contacted and gave OK to use dialysis catheter for resuscitation as needed.  4:21 PM BP improved to 110s/50s after initial IVF bolus.  Mental status improved.  Will obtain CT head.  5:25 PM CT head negative.  Workup unremarkable outside of lactic acidosis of 3.6.  BPs still 110s/60s.  Hospitalist consulted for admission.  Bed requested.  Disposition: Admit  Condition: Stable  Pt seen in conjunction with my attending, Dr. Lynelle Doctor.  Oleh Genin, MD PGY-II [facility] Emergency Medicine Resident   Oleh Genin, MD 05/07/12 [phone]

## 2012-05-07 NOTE — ED Notes (Signed)
Lab at bedside to draw at the time.

## 2012-05-08 DIAGNOSIS — R55 Syncope and collapse: Secondary | ICD-10-CM

## 2012-05-08 DIAGNOSIS — E119 Type 2 diabetes mellitus without complications: Secondary | ICD-10-CM

## 2012-05-08 DIAGNOSIS — D649 Anemia, unspecified: Secondary | ICD-10-CM

## 2012-05-08 LAB — CORTISOL: Cortisol, Plasma: 5.8 ug/dL

## 2012-05-08 LAB — GLUCOSE, CAPILLARY
Glucose-Capillary: 66 mg/dL — ABNORMAL LOW (ref 70–99)
Glucose-Capillary: 179 mg/dL — ABNORMAL HIGH (ref 70–99)

## 2012-05-08 LAB — RENAL FUNCTION PANEL
Albumin: 3.1 g/dL — ABNORMAL LOW (ref 3.5–5.2)
Calcium: 8.6 mg/dL (ref 8.4–10.5)
GFR calc Af Amer: 8 mL/min — ABNORMAL LOW (ref >90)
GFR calc non Af Amer: 7 mL/min — ABNORMAL LOW (ref >90)
Phosphorus: 5.5 mg/dL — ABNORMAL HIGH (ref 2.3–4.6)
Potassium: 4.3 mEq/L (ref 3.5–5.1)
Sodium: 134 mEq/L — ABNORMAL LOW (ref 135–145)

## 2012-05-08 LAB — CBC
Hemoglobin: 10.5 g/dL — ABNORMAL LOW (ref 13.0–17.0)
MCH: 30.2 pg (ref 26.0–34.0)
MCHC: 32.1 g/dL (ref 30.0–36.0)
Platelets: 236 10*3/uL (ref 150–400)
RDW: 13.7 % (ref 11.5–15.5)

## 2012-05-08 LAB — CULTURE, BLOOD (ROUTINE X 2)

## 2012-05-08 LAB — HEMOGLOBIN A1C: Hgb A1c MFr Bld: 8.2 % — ABNORMAL HIGH (ref <5.7)

## 2012-05-08 MED ORDER — SODIUM CHLORIDE 0.9 % IV SOLN: 100.0000 mL | INTRAVENOUS | Status: DC | PRN

## 2012-05-08 MED ORDER — HEPARIN SODIUM (PORCINE) 1000 UNIT/ML DIALYSIS
1000.0000 [IU] | INTRAMUSCULAR | Status: DC | PRN
  Filled 2012-05-08: qty 1

## 2012-05-08 MED ORDER — DEXTROSE 50 % IV SOLN
INTRAVENOUS | Status: AC
  Administered 2012-05-08: 12.5 g
  Filled 2012-05-08: qty 50

## 2012-05-08 MED ORDER — ALTEPLASE 2 MG IJ SOLR
2.0000 mg | Freq: Once | INTRAMUSCULAR | Status: AC | PRN
  Filled 2012-05-08: qty 2

## 2012-05-08 MED ORDER — PENTAFLUOROPROP-TETRAFLUOROETH EX AERO: 1.0000 "application " | INHALATION_SPRAY | CUTANEOUS | Status: DC | PRN

## 2012-05-08 MED ORDER — ENOXAPARIN SODIUM 30 MG/0.3ML ~~LOC~~ SOLN
30.0000 mg | SUBCUTANEOUS | Status: DC
  Administered 2012-05-08 – 2012-05-10 (×3): 30 mg via SUBCUTANEOUS
  Filled 2012-05-08 (×5): qty 0.3

## 2012-05-08 MED ORDER — NEPRO/CARBSTEADY PO LIQD: 237.0000 mL | ORAL | Status: DC | PRN

## 2012-05-08 MED ORDER — HEPARIN SODIUM (PORCINE) 1000 UNIT/ML DIALYSIS
9000.0000 [IU] | Freq: Once | INTRAMUSCULAR | Status: DC
  Filled 2012-05-08: qty 9

## 2012-05-08 MED ORDER — LIDOCAINE-PRILOCAINE 2.5-2.5 % EX CREA: 1.0000 "application " | TOPICAL_CREAM | CUTANEOUS | Status: DC | PRN

## 2012-05-08 MED ORDER — MIDODRINE HCL 5 MG PO TABS
10.0000 mg | ORAL_TABLET | Freq: Two times a day (BID) | ORAL | Status: DC
  Administered 2012-05-08: 10 mg via ORAL
  Filled 2012-05-08 (×4): qty 2

## 2012-05-08 MED ORDER — LIDOCAINE HCL (PF) 1 % IJ SOLN: 5.0000 mL | INTRAMUSCULAR | Status: DC | PRN

## 2012-05-08 NOTE — Progress Notes (Signed)
[facility]  Patient Status: Active (receiving services up to time of hospitalization)  [facility] is providing the following services: PT Referred for PT but readmitted to the hospital before start of services.  If patient discharges after hours, please call [phone].   [name] 05/08/2012, 11:12 AM

## 2012-05-08 NOTE — Progress Notes (Signed)
[facility] PROGRESS NOTE  [name]:[id number] DOB: 01/08/1945 DOA: 05/07/2012 PCP: [name], MD  Assessment/Plan: 1. Hypotension: -possibly related to chronic hypotension worsened after HD -no evidence of infection, DC Abx and observe -continue Midodrine 10mg  TID -received IVF yesterday  2. Generalized weakness /Falls:  -. Head CT scan is negative for acute findings, and patient has no focal neurologic deficit on physical examination. Likely, profound hypotension was the culprit, although his gradual decline may be due to ESRD. Patient per spouse, has had recurrent falls, which may be attributable to orthostasis and debility. Will consult PT/OT  3. Abd pain: possibly related to hypotension yesterday -resolved now, diet as tolerated, CT negative -DC USG -continue PPI  4. DM: stable -SSI for now  5. CAD: stable, cardiac enzymes negative -Fu echo  6. ESRD:  - Renal notified, gets HD TTS -midodrine TID  7. Anemia of chronic disease:  - aranesp per renal  DVt proph: lovenox  Code Status: Full code Family Communication: none at bedside Disposition Plan: to be determined   Consultants:  Renal  Antibiotics:  VAnc  Aztreonam  Levaquin  HPI/Subjective: Feels ok, no more abdominal pain  Objective: Filed Vitals:   05/07/12 1759 05/07/12 1900 05/07/12 2156 05/08/12 0432  BP: 110/69 112/60 108/70 129/67  Pulse: 73 77 85 84  Temp:  97.4 F (36.3 C) 97.4 F (36.3 C) 97.7 F (36.5 C)  TempSrc:  Oral Oral Oral  Resp: 18 18 20 20   Height:   5' 11.5" (1.816 m)   Weight:   110 kg (242 lb 8.1 oz)   SpO2: 100% 100% 99% 98%    Intake/Output Summary (Last 24 hours) at 05/08/12 0919 Last data filed at 05/07/12 1536  Gross per 24 hour  Intake    500 ml  Output      0 ml  Net    500 ml   Filed Weights   05/07/12 2156  Weight: 110 kg (242 lb 8.1 oz)    Exam:   General:  AAO  Cardiovascular: S1S2/RRR  Respiratory: CTAB  Abdomen: soft,  NT, BS present  Ext: trace edema c/c  Data Reviewed: Basic Metabolic Panel:  Recent Labs Lab 05/07/12 1518 05/07/12 1933  NA 135  --   K 3.8  --   CL 94*  --   CO2 25  --   GLUCOSE 126*  --   BUN 38*  --   CREATININE 6.34* 6.61*  CALCIUM 9.4  --    Liver Function Tests:  Recent Labs Lab 05/07/12 1518  AST 17  ALT 16  ALKPHOS 67  BILITOT 0.2*  PROT 6.6  ALBUMIN 3.1*   No results found for this basename: LIPASE, AMYLASE,  in the last 168 hours No results found for this basename: AMMONIA,  in the last 168 hours CBC:  Recent Labs Lab 05/07/12 1518 05/07/12 1933  WBC 11.6* 10.2  NEUTROABS 6.8  --   HGB 10.7* 11.2*  HCT 33.2* 35.5*  MCV 95.4 95.2  PLT 266 246   Cardiac Enzymes:  Recent Labs Lab 05/07/12 1519 05/07/12 1933 05/08/12 0203  TROPONINI <0.30 <0.30 <0.30   BNP (last 3 results) No results found for this basename: PROBNP,  in the last 8760 hours CBG:  Recent Labs Lab 05/07/12 1455 05/07/12 1504 05/07/12 2149 05/08/12 0817 05/08/12 0844  GLUCAP 126* 139* 124* 66* 114*    Recent Results (from the past 240 hour(s))  CULTURE, BLOOD (ROUTINE X 2)  Status: None   Collection Time    04/30/12  6:40 PM      Result Value Range Status   Specimen Description BLOOD ARM LEFT   Final   Special Requests BOTTLES DRAWN AEROBIC AND ANAEROBIC 10CC   Final   Culture  Setup Time 04/30/2012 22:52   Final   Culture STAPHYLOCOCCUS SPECIES (COAGULASE NEGATIVE)   Final   Report Status PENDING   Incomplete  CULTURE, BLOOD (ROUTINE X 2)     Status: None   Collection Time    04/30/12  6:50 PM      Result Value Range Status   Specimen Description BLOOD ARM LEFT   Final   Special Requests BOTTLES DRAWN AEROBIC AND ANAEROBIC 10CC   Final   Culture  Setup Time 04/30/2012 22:52   Final   Culture NO GROWTH 5 DAYS   Final   Report Status 05/06/2012 FINAL   Final  CLOSTRIDIUM DIFFICILE BY PCR     Status: None   Collection Time    04/30/12  7:08 PM       Result Value Range Status   C difficile by pcr NEGATIVE  NEGATIVE Final     Studies: Ct Head Wo Contrast  05/07/2012  *RADIOLOGY REPORT*  Clinical Data: Altered mental status  CT HEAD WITHOUT CONTRAST  Technique:  Contiguous axial images were obtained from the base of the skull through the vertex without contrast.  Comparison: 12/11/2011  Findings: No skull fracture is noted.  There is mild mucosal thickening right sphenoid sinus.  The mastoid air cells are unremarkable.  No intracranial hemorrhage, mass effect or midline shift.  Stable cerebral atrophy.  Stable periventricular and patchy subcortical chronic white matter disease.  No acute infarction.  No mass lesion is noted on this unenhanced scan.  IMPRESSION: No acute intracranial abnormality.  Stable atrophy and chronic white matter disease.   Original Report Authenticated By: Natasha Mead, M.D.    Ct Abdomen Pelvis W Contrast  05/07/2012  *RADIOLOGY REPORT*  Clinical Data: Old male abdominal pain.  Hypertension.  CT ABDOMEN AND PELVIS WITH CONTRAST  Technique:  Multidetector CT imaging of the abdomen and pelvis was performed following the standard protocol during bolus administration of intravenous contrast.  Contrast: OMNIPAQUE IOHEXOL 300 MG/ML  SOLN  Comparison: 04/30/2012 and earlier.  Findings: Stable lung bases.  Small lung nodules in the right costophrenic angle again noted, these are only minimally increased in 2011 most compatible with benign etiology.  No pericardial or pleural effusion. Stable small posterior mediastinal lymph nodes lateral to the thoracic  esophagus.  Chronic left posterior lateral rib fractures.  Chronic mild to moderate lumbar compression fractures. Stable visualized osseous structures.  Postoperative changes from left hip arthroplasty with streak artifact.  Stable small fat containing right inguinal hernia.  No pelvic free fluid is evident.  The bladder is decompressed.  Unchanged mild wall thickening in the distal  rectum which is decompressed as before.  The sigmoid is redundant extending up into the right abdomen but otherwise within normal limits.  Negative left colon and transverse colon.  The ascending colon today is less decompressed.  A 2-3 cm segment which is nondistended (coronal image 72) is felt to be physiologic. Oral contrast has reached the transverse colon.  No pericecal inflammation.  The appendix is diminutive or absent.  Terminal ileum within normal limits.  No dilated small bowel.  Stomach and duodenum within normal limits.  Liver, gallbladder, spleen, pancreas and adrenal glands are stable  and within normal limits.  Unchanged renal atrophy.  Insufficient contrast in the portal venous system today.  Major arterial structures in the abdomen pelvis are stable and appear patent despite widespread atherosclerosis.  No abdominal free fluid or free air.  No focal intra-abdominal or intrapelvic inflammatory stranding.  There is a small area of stranding in the right ventral abdomen subcutaneous soft tissues (series 2 image 7) with probably relate to recent subcutaneous injection.  IMPRESSION: Stable CT appearance of the abdomen and pelvis with no focal inflammatory process identified.   Original Report Authenticated By: Erskine Speed, M.D.    Dg Chest Port 1 View  05/07/2012  *RADIOLOGY REPORT*  Clinical Data: Altered mental status, dialysis patient, history of COPD and CHF  PORTABLE CHEST - 1 VIEW  Comparison: 02/14/2012; 01/01/2012  Findings:  Grossly unchanged enlarged cardiac silhouette and mediastinal contours with apparent differences attributable to decreased lung volumes and AP projection. Stable positioning of support apparatus. The pulmonary vasculature is less distinct on the present examination with cephalization of flow.  No focal airspace opacities.  No definite pleural effusion or pneumothorax. Unchanged bones.  IMPRESSION: Suspected mild pulmonary edema on this hypoventilated AP supine examination.    Original Report Authenticated By: Tacey Ruiz, MD     Scheduled Meds: . artificial tears  1 application Right Eye BID  . brimonidine  1 drop Both Eyes BID  . cinacalcet  30 mg Oral Daily  . heparin  5,000 Units Subcutaneous Q8H  . insulin aspart  0-9 Units Subcutaneous TID WC  . midodrine  10 mg Oral TID WC  . pantoprazole  40 mg Oral Daily  . simvastatin  20 mg Oral q1800  . sodium chloride  3 mL Intravenous Q12H  . sodium chloride  3 mL Intravenous Q12H  . cyanocobalamin  1,000 mcg Oral Daily   Continuous Infusions:   Active Problems:   Weakness generalized   Diabetes mellitus   CAD (coronary artery disease)    Time spent:9min    [facility]  [facility] Pager [phone]. If 7PM-7AM, please contact night-coverage at [email or web address], password [facility] 05/08/2012, 9:19 AM  LOS: 1 day

## 2012-05-08 NOTE — Consult Note (Signed)
[name] 05/08/2012 [name] D Requesting Physician:  Dr Jomarie Longs  Reason for Consult:  ESRD pt with hypotension yesterday HPI: The patient is a 67 y.o. year-old w DM 2, ESRD, CM EF 45%, CAD x MI 2006, CVA 2006. Pt gets HD in [place] [facility], extra day Monday was added about one month ago for volume control. Pt at home yesterday when [facility] noted BP in 60's. Told pt to go to ER. No symptoms per pt. ER notes say pt was difficult to arouse in ED, but appropriate. ED notes show BP 56/25 initially. Now 108/70 after some fluid boluses.   Pt without c/o's, no SOB or CP, no fevers, no cath drainage, no n/v/d or abd pain   Hospital admissions >>  Sept 2008- New start to HD, R arm AVG June 2009- Ligated AVG due to steal Aug 2011- Atypical CP, diast HF, EF 45% Aug 2011- Hypotension after HD Nov 2011- acute delirium, ^Ca++- resolved Nov 2013- Hypotension, BP 70's- improved with fluid Dec 2013- Syncopal episode after HD, midodrine inc'd April 30- May 02 2012-  Diarrhea, Cdif negative. Rx with po abx, resolved     ROS  no joint pain  no skin rash  no confusion   Past Medical History:  Past Medical History  Diagnosis Date  . CHF (congestive heart failure)   . CAD (coronary artery disease) 2006  . COPD (chronic obstructive pulmonary disease)   . Hyperlipidemia   . ESRD (end stage renal disease) on dialysis started 08/2006    MTTS [place]; "getting ready to have it on TTS" (05/07/2012)  . Type II diabetes mellitus     etiology of ESRD  . Diabetic blindness   . Hypertension   . MI, old 2006  . Asthma   . Pneumonia     "everytime I take a cold" (05/07/2012)  . Bronchiectasis   . Shortness of breath     "can happen at anytime" (05/07/2012)  . GERD (gastroesophageal reflux disease)   . Stroke 2006    "numb on left side since"  05/07/2012  . On home oxygen therapy     "3L at night mostly; also prn" (05/07/2012)    Past Surgical History:  Past Surgical History  Procedure Laterality  Date  . Av fistula placement Right 09/2006    "used it once" (05/07/2012)  . Appendectomy  ~ 1958  . Insertion of dialysis catheter Left 09/2006    "chest" (05/07/2012)  . Total hip arthroplasty Left 1997  . Repair ankle ligament Left 1981  . Cataract extraction w/ intraocular lens implant Left ?2012    Family History:  Family History  Problem Relation Age of Onset  . Diabetes Mellitus II Father    Social History:  reports that he quit smoking about 28 years ago. His smoking use included Cigarettes. He has a 60 pack-year smoking history. He has never used smokeless tobacco. He reports that  drinks alcohol. He reports that he does not use illicit drugs.  Allergies:  Allergies  Allergen Reactions  . Cefepime Itching  . Penicillins Hives and Swelling    Home medications: Prior to Admission medications   Medication Sig Start Date End Date Taking? Authorizing Provider  artificial tears (LACRILUBE) OINT ophthalmic ointment Place 1 application into the right eye 2 (two) times daily.   Yes Historical Provider, MD  aspirin 325 MG tablet Take 325 mg by mouth daily.   Yes Historical Provider, MD  brimonidine (ALPHAGAN) 0.2 % ophthalmic solution Place 1 drop  into both eyes 2 (two) times daily.   Yes Historical Provider, MD  calcium acetate (PHOSLO) 667 MG capsule Take 4 capsules (2,668 mg total) by mouth 3 (three) times daily with meals. 11/04/11  Yes Joseph Art, DO  carboxymethylcellulose (REFRESH CELLUVISC) 1 % ophthalmic solution Place 1 drop into both eyes every 2 (two) hours.   Yes Historical Provider, MD  cinacalcet (SENSIPAR) 30 MG tablet Take 30 mg by mouth daily.   Yes Historical Provider, MD  ciprofloxacin (CIPRO) 500 MG tablet Take 500 mg by mouth every morning. For 7 days; Start date 05/02/12 05/02/12 05/09/12 Yes Richarda Overlie, MD  cyanocobalamin 500 MCG tablet Take 1,000 mcg by mouth daily.   Yes Historical Provider, MD  folic acid-vitamin b complex-vitamin c-selenium-zinc (DIALYVITE) 3  MG TABS Take 1 tablet by mouth daily.   Yes Historical Provider, MD  gabapentin (NEURONTIN) 100 MG capsule Take 300 mg by mouth at bedtime as needed (nerve pain).   Yes Historical Provider, MD  insulin aspart (NOVOLOG) 100 UNIT/ML injection Inject 26 Units into the skin 3 (three) times daily before meals.    Yes Historical Provider, MD  insulin glargine (LANTUS) 100 UNIT/ML injection Inject 60 Units into the skin at bedtime. 05/02/12  Yes Richarda Overlie, MD  metroNIDAZOLE (FLAGYL) 500 MG tablet Take 500 mg by mouth 3 (three) times daily. Duration unknown 05/02/12 05/09/12 Yes Richarda Overlie, MD  midodrine (PROAMATINE) 10 MG tablet Take 1 tablet (10 mg total) by mouth Every Tuesday,Thursday,and Saturday with dialysis. 12/12/11  Yes Lesle Chris Black, NP  omeprazole (PRILOSEC) 20 MG capsule Take 20 mg by mouth 2 (two) times daily.    Yes Historical Provider, MD  oxycodone (OXY-IR) 5 MG capsule Take 10 mg by mouth every 6 (six) hours as needed for pain.    Yes Historical Provider, MD  pravastatin (PRAVACHOL) 40 MG tablet Take 40 mg by mouth at bedtime.    Yes Historical Provider, MD  rOPINIRole (REQUIP) 0.25 MG tablet Take 0.25-0.5 mg by mouth 3 (three) times daily. 1 tablet twice a day and 2 tablets at bedtime.   Yes Historical Provider, MD  sennosides-docusate sodium (SENOKOT-S) 8.6-50 MG tablet Take 1 tablet by mouth 2 (two) times daily as needed for constipation. For constipation   Yes Historical Provider, MD    Labs: Basic Metabolic Panel:  Recent Labs Lab 05/07/12 1518 05/07/12 1933 05/08/12 0900  NA 135  --  134*  K 3.8  --  4.3  CL 94*  --  93*  CO2 25  --  27  GLUCOSE 126*  --  110*  BUN 38*  --  42*  CREATININE 6.34* 6.61* 7.65*  CALCIUM 9.4  --  8.6  PHOS  --   --  5.5*   Liver Function Tests:  Recent Labs Lab 05/07/12 1518 05/08/12 0900  AST 17  --   ALT 16  --   ALKPHOS 67  --   BILITOT 0.2*  --   PROT 6.6  --   ALBUMIN 3.1* 3.1*   No results found for this basename: LIPASE,  AMYLASE,  in the last 168 hours No results found for this basename: AMMONIA,  in the last 168 hours CBC:  Recent Labs Lab 05/07/12 1518 05/07/12 1933 05/08/12 0900  WBC 11.6* 10.2 8.0  NEUTROABS 6.8  --   --   HGB 10.7* 11.2* 10.5*  HCT 33.2* 35.5* 32.7*  MCV 95.4 95.2 94.0  PLT 266 246 236   PT/INR: @LABRCNTIP (inr:5)  Cardiac Enzymes: ) Recent Labs Lab 05/07/12 1519 05/07/12 1933 05/08/12 0203 05/08/12 0900  TROPONINI <0.30 <0.30 <0.30 <0.30   CBG:  Recent Labs Lab 05/07/12 1504 05/07/12 2149 05/08/12 0817 05/08/12 0844 05/08/12 1234  GLUCAP 139* 124* 66* 114* 162*    Physical Exam:  Blood pressure 110/62, pulse 67, temperature 97.8 F (36.6 C), temperature source Oral, resp. rate 18, height 5' 11.5" (1.816 m), weight 110 kg (242 lb 8.1 oz), SpO2 100.00%.  Gen: alert, in no distress HEENT:  EOMI, sclera anicteric, throat clear Neck: no JVD, no LAN Chest: clear bilat  CV: regular, no rub or gallop Abdomen: soft, obese, nontender, no ascites Ext: no LE or UE edema, no joint effusion or deformity, no gangrene or ulceration Neuro: alert, Ox3, no focal deficit Access: L IJ tunneled HD cath with clean exit site  Outpatient HD: [facility]  3hr  105kg EDW  F180  450/800  2/2.5 Bath  L IJ cath  EPO 1800  Hectorol 2ug  Heparin 9000   Impression/Plan  1. Hypotension- exam not c/w sepsis or cardiac source. 1 of 2 blood cultures from 4/30 when pt was here in hospital was + for CNSS. This was likely a contaminant. Pt does have a catheter but no physical signs of infection or sepsis. Suspect this is hemodynamic due to volume shifts with HD. Pt has known syst HF and is at risk for R HF from OHS/pulm HTN as well. Has not had ECHO here since 2011.  Does not have any vol overload today on exam, though by wts is up 5kg.  Has had prior admissions for low BP and syncopal episodes.  Was taking midodrine only 10 mg once a day at home- will increase to 10 mg bid.  2. ESRD, cont  HD MTTS. HD today, remove 3 kg as tolerated (up 5kg). Reweigh prior to HD 3. Anemia of CKD- hold EPO for now 4. Secondary HPTH- cont vit D 5. COPD on home O2 6. Hx MI 7. Hx CVA '06   Vinson Moselle  MD [facility] [phone] pgr    [phone] cell 05/08/2012, 1:02 PM

## 2012-05-09 DIAGNOSIS — I519 Heart disease, unspecified: Secondary | ICD-10-CM

## 2012-05-09 LAB — BASIC METABOLIC PANEL
BUN: 55 mg/dL — ABNORMAL HIGH (ref 6–23)
CO2: 25 mEq/L (ref 19–32)
Chloride: 92 mEq/L — ABNORMAL LOW (ref 96–112)
Glucose, Bld: 207 mg/dL — ABNORMAL HIGH (ref 70–99)
Potassium: 4.9 mEq/L (ref 3.5–5.1)

## 2012-05-09 LAB — CBC
HCT: 31.2 % — ABNORMAL LOW (ref 39.0–52.0)
Hemoglobin: 9.9 g/dL — ABNORMAL LOW (ref 13.0–17.0)
WBC: 9.4 10*3/uL (ref 4.0–10.5)

## 2012-05-09 LAB — GLUCOSE, CAPILLARY: Glucose-Capillary: 208 mg/dL — ABNORMAL HIGH (ref 70–99)

## 2012-05-09 MED ORDER — HEPARIN SODIUM (PORCINE) 1000 UNIT/ML DIALYSIS
4000.0000 [IU] | INTRAMUSCULAR | Status: DC | PRN
  Filled 2012-05-09: qty 4

## 2012-05-09 MED ORDER — NEPRO/CARBSTEADY PO LIQD: 237.0000 mL | ORAL | Status: DC | PRN

## 2012-05-09 MED ORDER — ARTIFICIAL TEARS OP OINT
1.0000 "application " | TOPICAL_OINTMENT | Freq: Two times a day (BID) | OPHTHALMIC | Status: DC
  Administered 2012-05-09 – 2012-05-11 (×4): 1 via OPHTHALMIC

## 2012-05-09 MED ORDER — CARBOXYMETHYLCELLULOSE SODIUM 1 % OP SOLN: 1.0000 [drp] | OPHTHALMIC | Status: DC | PRN

## 2012-05-09 MED ORDER — MIDODRINE HCL 5 MG PO TABS
ORAL_TABLET | ORAL | Status: AC
  Administered 2012-05-09: 10 mg via ORAL
  Filled 2012-05-09: qty 2

## 2012-05-09 MED ORDER — VANCOMYCIN HCL IN DEXTROSE 1-5 GM/200ML-% IV SOLN
1000.0000 mg | Freq: Once | INTRAVENOUS | Status: AC
  Administered 2012-05-09: 1000 mg via INTRAVENOUS
  Filled 2012-05-09: qty 200

## 2012-05-09 MED ORDER — SODIUM CHLORIDE 0.9 % IV SOLN: 100.0000 mL | INTRAVENOUS | Status: DC | PRN

## 2012-05-09 MED ORDER — MIDODRINE HCL 5 MG PO TABS
10.0000 mg | ORAL_TABLET | Freq: Three times a day (TID) | ORAL | Status: DC
  Administered 2012-05-10 – 2012-05-11 (×3): 10 mg via ORAL
  Filled 2012-05-09 (×9): qty 2

## 2012-05-09 MED ORDER — ARTIFICIAL TEARS OP OINT
TOPICAL_OINTMENT | Freq: Two times a day (BID) | OPHTHALMIC | Status: DC
  Administered 2012-05-09: 14:00:00 via OPHTHALMIC

## 2012-05-09 MED ORDER — BRIMONIDINE TARTRATE 0.2 % OP SOLN
1.0000 [drp] | Freq: Two times a day (BID) | OPHTHALMIC | Status: DC
  Administered 2012-05-09 – 2012-05-11 (×5): 1 [drp] via OPHTHALMIC

## 2012-05-09 MED ORDER — INSULIN ASPART 100 UNIT/ML ~~LOC~~ SOLN
0.0000 [IU] | Freq: Every day | SUBCUTANEOUS | Status: DC
  Administered 2012-05-10: 2 [IU] via SUBCUTANEOUS

## 2012-05-09 MED ORDER — LIDOCAINE-PRILOCAINE 2.5-2.5 % EX CREA: 1.0000 "application " | TOPICAL_CREAM | CUTANEOUS | Status: DC | PRN

## 2012-05-09 MED ORDER — PENTAFLUOROPROP-TETRAFLUOROETH EX AERO: 1.0000 "application " | INHALATION_SPRAY | CUTANEOUS | Status: DC | PRN

## 2012-05-09 MED ORDER — ALTEPLASE 2 MG IJ SOLR
2.0000 mg | Freq: Once | INTRAMUSCULAR | Status: AC | PRN
  Filled 2012-05-09: qty 2

## 2012-05-09 MED ORDER — HEPARIN SODIUM (PORCINE) 1000 UNIT/ML DIALYSIS
1000.0000 [IU] | INTRAMUSCULAR | Status: DC | PRN
  Filled 2012-05-09: qty 1

## 2012-05-09 MED ORDER — LIDOCAINE HCL (PF) 1 % IJ SOLN: 5.0000 mL | INTRAMUSCULAR | Status: DC | PRN

## 2012-05-09 MED ORDER — INSULIN ASPART 100 UNIT/ML ~~LOC~~ SOLN
0.0000 [IU] | Freq: Three times a day (TID) | SUBCUTANEOUS | Status: DC
  Administered 2012-05-10: 3 [IU] via SUBCUTANEOUS
  Administered 2012-05-10: 7 [IU] via SUBCUTANEOUS
  Administered 2012-05-11: 5 [IU] via SUBCUTANEOUS

## 2012-05-09 NOTE — Evaluation (Signed)
Physical Therapy Evaluation Patient Details Name: [name] MRN: [id number] DOB: 12/14/1945 Today's Date: 05/09/2012 Time: 4098-1191 PT Time Calculation (min): 29 min  PT Assessment / Plan / Recommendation Clinical Impression   Pt admitted with generalized weakness. Pt currently with functional limitations due to the deficits listed below (PT Problem List). Pt will benefit from skilled PT to increase their independence and safety with mobility to allow discharge home with HHPT and support of family.       PT Assessment  Patient needs continued PT services    Follow Up Recommendations  Home health PT;Supervision/Assistance - 24 hour    Does the patient have the potential to tolerate intense rehabilitation      Barriers to Discharge None      Equipment Recommendations  None recommended by PT    Recommendations for Other Services     Frequency Min 2X/week    Precautions / Restrictions Precautions Precautions: Fall Restrictions Weight Bearing Restrictions: No   Pertinent Vitals/Pain No c/o pain.        Mobility  Bed Mobility Bed Mobility: Sitting - Scoot to Edge of Bed;Rolling Right;Right Sidelying to Sit Rolling Right: 5: Supervision;With rail Right Sidelying to Sit: 4: Min guard;HOB flat;With rails Left Sidelying to Sit: Not tested (comment) Sitting - Scoot to Edge of Bed: 4: Min guard;With rail Sit to Supine: Not Tested (comment) Details for Bed Mobility Assistance: Pt able to perform task with supervision for safety.   Transfers Transfers: Sit to Stand;Stand to [employer] Transfers Sit to Stand: 4: Min assist;4: Min guard;From bed;With upper extremity assist;From elevated surface Stand to Sit: 4: Min assist;To bed;To chair/3-in-1;With upper extremity assist Stand Pivot Transfers: 4: Min assist Details for Transfer Assistance: Min assist first trial from/to bed. Min guard second trial.  Min assist to steady pt and vcs for safe technique to stand pivot to  low chair.   Ambulation/Gait Ambulation/Gait Assistance: Not tested (comment)    Exercises     PT Diagnosis: Difficulty walking;Generalized weakness;Hemiplegia non-dominant side;Altered mental status  PT Problem List: Decreased strength;Decreased activity tolerance;Decreased balance;Decreased mobility;Decreased cognition PT Treatment Interventions: Gait training;DME instruction;Functional mobility training;Therapeutic exercise;Therapeutic activities;Neuromuscular re-education;Patient/family education   PT Goals Acute Rehab PT Goals PT Goal Formulation: With patient/family Time For Goal Achievement: 05/23/12 Potential to Achieve Goals: Good Pt will Transfer Bed to Chair/Chair to Bed: with supervision PT Transfer Goal: Bed to Chair/Chair to Bed - Progress: Goal set today Pt will Ambulate: 1 - 15 feet;with min assist;with rolling walker PT Goal: Ambulate - Progress: Goal set today  Visit Information  Last PT Received On: 05/09/12 Assistance Needed: +1    Subjective Data  Subjective: Family report pt is dead weight for transfers at home.   Patient Stated Goal: Wife - to walk   Prior Functioning  Home Living Lives With: Spouse Available Help at Discharge: Family;Home health Type of Home: House Home Access: Ramped entrance Home Layout: One level Bathroom Accessibility: Yes Home Adaptive Equipment: Bedside commode/3-in-1;Walker - rolling;Wheelchair - IT trainer Prior Function Level of Independence: Needs assistance Needs Assistance: Bathing;Dressing;Toileting;Meal Prep;Light Housekeeping;Transfers Bath: Maximal Dressing: Maximal Toileting: Maximal Meal Prep: Total Light Housekeeping: Total Transfer Assistance: Mod assist with bed to wheelchair transfers Able to Take Stairs?: No Driving: No Comments: Pt uses power wheel chair as primary mode of mobility Communication Communication: No difficulties    Cognition  Cognition Arousal/Alertness:  Awake/alert Behavior During Therapy: WFL for tasks assessed/performed Overall Cognitive Status: History of cognitive impairments - at baseline  Extremity/Trunk Assessment Right Upper Extremity Assessment RUE ROM/Strength/Tone: WFL for tasks assessed Left Upper Extremity Assessment LUE ROM/Strength/Tone: WFL for tasks assessed Right Lower Extremity Assessment RLE ROM/Strength/Tone: Deficits RLE ROM/Strength/Tone Deficits: Strength 4-/5 Left Lower Extremity Assessment LLE ROM/Strength/Tone: Deficits LLE ROM/Strength/Tone Deficits: 4-/5 strength   Balance Balance Balance Assessed: Yes Static Sitting Balance Static Sitting - Balance Support: No upper extremity supported;Feet supported Static Sitting - Level of Assistance: 5: Stand by assistance Static Sitting - Comment/# of Minutes: pt c/o dizziness  End of Session PT - End of Session Equipment Utilized During Treatment: Gait belt Activity Tolerance: Patient limited by fatigue Patient left: in chair;with call bell/phone within reach Nurse Communication: Mobility status  GP     [name] 05/09/2012, 4:44 PM [name] L. Chilton Si DPT   pager [phone]     Cell [phone]

## 2012-05-09 NOTE — Progress Notes (Addendum)
Subjective: Calm, alert, no complaints, on HD  Objective Vital signs in last 24 hours: Filed Vitals:   05/09/12 0800 05/09/12 0830 05/09/12 0900 05/09/12 0930  BP: 81/64 101/60 100/73 98/57  Pulse: 83 76 73 84  Temp:      TempSrc:      Resp: 11 18 15 18   Height:      Weight:      SpO2: 100% 100% 100% 100%   Weight change: 2.6 kg (5 lb 11.7 oz)  Intake/Output Summary (Last 24 hours) at 05/09/12 1010 Last data filed at 05/08/12 1300  Gross per 24 hour  Intake     35 ml  Output      0 ml  Net     35 ml   Labs: Basic Metabolic Panel:  Recent Labs Lab 05/07/12 1518 05/07/12 1933 05/08/12 0900 05/09/12 0600  NA 135  --  134* 133*  K 3.8  --  4.3 4.9  CL 94*  --  93* 92*  CO2 25  --  27 25  GLUCOSE 126*  --  110* 207*  BUN 38*  --  42* 55*  CREATININE 6.34* 6.61* 7.65* 9.13*  CALCIUM 9.4  --  8.6 8.8  PHOS  --   --  5.5*  --    Liver Function Tests:  Recent Labs Lab 05/07/12 1518 05/08/12 0900  AST 17  --   ALT 16  --   ALKPHOS 67  --   BILITOT 0.2*  --   PROT 6.6  --   ALBUMIN 3.1* 3.1*   No results found for this basename: LIPASE, AMYLASE,  in the last 168 hours No results found for this basename: AMMONIA,  in the last 168 hours CBC:  Recent Labs Lab 05/07/12 1518 05/07/12 1933 05/08/12 0900 05/09/12 0600  WBC 11.6* 10.2 8.0 9.4  NEUTROABS 6.8  --   --   --   HGB 10.7* 11.2* 10.5* 9.9*  HCT 33.2* 35.5* 32.7* 31.2*  MCV 95.4 95.2 94.0 97.5  PLT 266 246 236 259   PT/INR: @LABRCNTIP (inr:5)   Scheduled Meds ) . brimonidine  1 drop Both Eyes BID  . cinacalcet  30 mg Oral Daily  . enoxaparin (LOVENOX) injection  30 mg Subcutaneous Q24H  . heparin  9,000 Units Dialysis Once in dialysis  . insulin aspart  0-9 Units Subcutaneous TID WC  . midodrine  10 mg Oral TID WC  . pantoprazole  40 mg Oral Daily  . simvastatin  20 mg Oral q1800  . sodium chloride  3 mL Intravenous Q12H  . sodium chloride  3 mL Intravenous Q12H  . cyanocobalamin  1,000  mcg Oral Daily    Physical Exam:  Blood pressure 98/57, pulse 84, temperature 97.9 F (36.6 C), temperature source Oral, resp. rate 18, height 5' 11.5" (1.816 m), weight 112.5 kg (248 lb 0.3 oz), SpO2 100.00%.  Gen: alert, in no distress  HEENT: EOMI, sclera anicteric, throat clear  Neck: no JVD, no LAN  Chest: clear bilat  CV: regular, no rub or gallop  Abdomen: soft, obese, nontender, no ascites  Ext: no LE or UE edema, no joint effusion or deformity, no gangrene or ulceration  Neuro: alert, Ox3, no focal deficit  Access: L IJ tunneled HD cath with clean exit site   Outpatient HD: [facility] 3hr 105kg EDW F180 450/800 2/2.5 Bath L IJ cath EPO 1800 Hectorol 2ug Heparin 9000   Impression/Plan  1. Hypotension- had fever yesterday to  101. Discussed with primary MD, plan is to get blood cx's to r/o cath sepsis and start empiric Vanc IV.  Midodrine has been increased to 10 tid. 2. ESRD, cont HD MTTS. HD today (for yesterday) and tomorrow 3. Volume/BP- takes midodrine for hypotension as outpt.  Only 2 kg up after HD today, UF 2.5kg tomorrow as tolerated 4. Anemia of CKD- hold EPO for now 5. Secondary HPTH- cont vit D 6. COPD on home O2 7. Hx MI 8. Hx CVA '06    Todd Moselle  MD [phone] pgr    [phone] cell 05/09/2012, 10:10 AM

## 2012-05-09 NOTE — Care Management Note (Signed)
   CARE MANAGEMENT NOTE 05/09/2012  Patient:  [name], [name]   Account Number:  [id number]  Date Initiated:  05/09/2012  Documentation initiated by:  Philomena Buttermore  Subjective/Objective Assessment:     Action/Plan:   Met with pt on 5/8 re d/c needs. Pt has HH aide from [place] for 2hr, 2x/wk. Spoke with pt wife by phone who reports needing more help. Await PT and OT eval.   Anticipated DC Date:     Anticipated DC Plan:  HOME W HOME HEALTH SERVICES         Choice offered to / List presented to:          [facility] arranged  HH-2 PT  HH-3 OT      [facility] agency  [facility]   Status of service:  In process, will continue to follow Medicare Important Message given?   (If response is "NO", the following Medicare IM given date fields will be blank) Date Medicare IM given:   Date Additional Medicare IM given:    Discharge Disposition:  HOME W HOME HEALTH SERVICES  Per UR Regulation:    If discussed at Long Length of Stay Meetings, dates discussed:    Comments:

## 2012-05-09 NOTE — Progress Notes (Signed)
Received call that patient was in accelerated junctional rhythm with HR 80. Patient alert, asymptomatic. Dr. Bretta Bang notified.

## 2012-05-09 NOTE — Progress Notes (Signed)
Pt cbg is 217. Pt does states he takes insulin at home, but does not have sliding scale/night time coverage for his cbgs at this time. Lenny Pastel paged and made aware. Awaiting response. Will continue to monitor.

## 2012-05-09 NOTE — Progress Notes (Signed)
ANTIBIOTIC CONSULT NOTE - INITIAL  Pharmacy Consult for :  Vancomycin Indication: Possible HD cath infection  Hospital Problems: Active Problems:   Weakness generalized   Diabetes mellitus   CAD (coronary artery disease)   Allergies: Allergies  Allergen Reactions  . Cefepime Itching  . Penicillins Hives and Swelling    Patient Measurements: Height: 5' 11.5" (181.6 cm) Weight: 236 lb 1.8 oz (107.1 kg) IBW/kg (Calculated) : 76.45  Vital Signs: BP 80/60  Pulse 65  Temp(Src) 98.2 F (36.8 C) (Oral)  Resp 16  Ht 5' 11.5" (1.816 m)  Wt 236 lb 1.8 oz (107.1 kg)  BMI 32.48 kg/m2  SpO2 100%  Labs:  Recent Labs  05/07/12 1933 05/08/12 0900 05/09/12 0600  WBC 10.2 8.0 9.4  HGB 11.2* 10.5* 9.9*  PLT 246 236 259  CREATININE 6.61* 7.65* 9.13*   Estimated Creatinine Clearance: 10.1 ml/min (by C-G formula based on Cr of 9.13).   Microbiology: Recent Results (from the past 720 hour(s))  CULTURE, BLOOD (ROUTINE X 2)     Status: None   Collection Time    04/30/12  6:40 PM      Result Value Range Status   Specimen Description BLOOD ARM LEFT   Final   Special Requests BOTTLES DRAWN AEROBIC AND ANAEROBIC 10CC   Final   Culture  Setup Time 04/30/2012 22:52   Final   Culture     Final   Value: STAPHYLOCOCCUS EPIDERMIDIS     Note: Gram Stain Report Called to,Read Back By and Verified With: JANET GREEN 05/06/12 1430 BY MDDAN   Report Status 05/08/2012 FINAL   Final   Organism ID, Bacteria STAPHYLOCOCCUS EPIDERMIDIS   Final  CULTURE, BLOOD (ROUTINE X 2)     Status: None   Collection Time    04/30/12  6:50 PM      Result Value Range Status   Specimen Description BLOOD ARM LEFT   Final   Special Requests BOTTLES DRAWN AEROBIC AND ANAEROBIC 10CC   Final   Culture  Setup Time 04/30/2012 22:52   Final   Culture NO GROWTH 5 DAYS   Final   Report Status 05/06/2012 FINAL   Final  CLOSTRIDIUM DIFFICILE BY PCR     Status: None   Collection Time    04/30/12  7:08 PM      Result  Value Range Status   C difficile by pcr NEGATIVE  NEGATIVE Final  CULTURE, BLOOD (ROUTINE X 2)     Status: None   Collection Time    05/07/12  3:35 PM      Result Value Range Status   Specimen Description BLOOD BLOOD LEFT FOREARM   Final   Special Requests BOTTLES DRAWN AEROBIC ONLY 2CC   Final   Culture  Setup Time 05/08/2012 00:43   Final   Culture     Final   Value:        BLOOD CULTURE RECEIVED NO GROWTH TO DATE CULTURE WILL BE HELD FOR 5 DAYS BEFORE ISSUING A FINAL NEGATIVE REPORT   Report Status PENDING   Incomplete  CULTURE, BLOOD (ROUTINE X 2)     Status: None   Collection Time    05/07/12  3:41 PM      Result Value Range Status   Specimen Description BLOOD HAND LEFT   Final   Special Requests BOTTLES DRAWN AEROBIC ONLY 5CC   Final   Culture  Setup Time 05/08/2012 00:43   Final   Culture  Final   Value:        BLOOD CULTURE RECEIVED NO GROWTH TO DATE CULTURE WILL BE HELD FOR 5 DAYS BEFORE ISSUING A FINAL NEGATIVE REPORT   Report Status PENDING   Incomplete    Medical/Surgical History: Past Medical History  Diagnosis Date  . CHF (congestive heart failure)   . CAD (coronary artery disease) 2006  . COPD (chronic obstructive pulmonary disease)   . Hyperlipidemia   . ESRD (end stage renal disease) on dialysis started 08/2006    MTTS [place]; "getting ready to have it on TTS" (05/07/2012)  . Type II diabetes mellitus     etiology of ESRD  . Diabetic blindness   . Hypertension   . MI, old 2006  . Asthma   . Pneumonia     "everytime I take a cold" (05/07/2012)  . Bronchiectasis   . Shortness of breath     "can happen at anytime" (05/07/2012)  . GERD (gastroesophageal reflux disease)   . Stroke 2006    "numb on left side since"  05/07/2012  . On home oxygen therapy     "3L at night mostly; also prn" (05/07/2012)   Past Surgical History  Procedure Laterality Date  . Av fistula placement Right 09/2006    "used it once" (05/07/2012)  . Appendectomy  ~ 1958  . Insertion of  dialysis catheter Left 09/2006    "chest" (05/07/2012)  . Total hip arthroplasty Left 1997  . Repair ankle ligament Left 1981  . Cataract extraction w/ intraocular lens implant Left ?2012    Medications:  Anti-infectives   Start     Dose/Rate Route Frequency Ordered Stop   05/09/12 2200  levofloxacin (LEVAQUIN) IVPB 500 mg  Status:  Discontinued     500 mg 100 mL/hr over 60 Minutes Intravenous Every 48 hours 05/07/12 1610 05/08/12 0905   05/08/12 2000  aztreonam (AZACTAM) 1 g in dextrose 5 % 50 mL IVPB  Status:  Discontinued     1 g 100 mL/hr over 30 Minutes Intravenous Every 24 hours 05/07/12 1610 05/08/12 0905   05/07/12 1545  levofloxacin (LEVAQUIN) IVPB 750 mg     750 mg 100 mL/hr over 90 Minutes Intravenous  Once 05/07/12 1533 05/07/12 2332   05/07/12 1545  aztreonam (AZACTAM) 2 g in dextrose 5 % 50 mL IVPB     2 g 100 mL/hr over 30 Minutes Intravenous  Once 05/07/12 1533 05/08/12 0029   05/07/12 1545  vancomycin (VANCOCIN) 2,000 mg in sodium chloride 0.9 % 500 mL IVPB     2,000 mg 250 mL/hr over 120 Minutes Intravenous  Once 05/07/12 1533 05/07/12 1845      Assessment:  67 y/o male s/p Vanc, Aztreonam, and LVQ from admission for presumed sepsis.  These were discontinued due to no evidence of infection at that time.  Since then, patient has had hypotension initally thought to have been a result of volume shifts from HD.  He was febrile to 101.1 F.  WBC's 9.4.  Cultures drawn on admission report NGTD.  The patient received a loading dose of Vancomycin on admission.  He will have had one HD session after today's.  Goal of Therapy:   Pre-HD Vancomycin levels 15 - 25 mcg/ml  Plan:   Restart Vancomycin at 1 gm IV q HD, first dose today.  Follow up HD schedule, cultures, clinical course.   Avelynn Sellin, Elisha Headland,  Pharm.D.,    5/9/20141:38 PM

## 2012-05-09 NOTE — Progress Notes (Signed)
Inpatient Diabetes Program Recommendations  AACE/ADA: New Consensus Statement on Inpatient Glycemic Control (2013)  Target Ranges:  Prepandial:   less than 140 mg/dL      Peak postprandial:   less than 180 mg/dL (1-2 hours)      Critically ill patients:  140 - 180 mg/dL   Fasting hyperlgycemia  Inpatient Diabetes Program Recommendations Insulin - Basal: Fasting glucose this am hgh at 207 mg/dL Please consider adding some of home Lantus 15 units (pt takes 35 units at home)   Thank you, Lenor Coffin, RN, CNS, Diabetes Coordinator [phone])

## 2012-05-09 NOTE — Progress Notes (Signed)
  Echocardiogram 2D Echocardiogram has been performed.  Lougenia Morrissey FRANCES 05/09/2012, 4:19 PM

## 2012-05-09 NOTE — Progress Notes (Addendum)
[facility] PROGRESS NOTE  [name] BJY:[id number] DOB: 1945-01-03 DOA: 05/07/2012 PCP: [name], MD  Assessment/Plan: 1. Hypotension: -Initially suspected to be from volume shifts with HD, chronic hypotension -febrile yesterday, concern for possible HD catheter infection, - start IV Vancomcyin, Fu blood cultures drawn 5/8 during fevers -continue Midodrine 10mg  TID -received IVF 5/8  2. Generalized weakness /Falls:  -. Head CT scan is negative for acute findings, and patient has no focal neurologic deficit on physical examination. Likely, profound hypotension was the culprit, although his gradual decline may be due to ESRD. Patient per spouse, has had recurrent falls, which may be attributable to orthostasis and debility. Will consult PT/OT  3. Abd pain: possibly related to hypotension yesterday -resolved now, diet as tolerated, CT negative -DC USG -continue PPI  4. DM: stable -SSI for now  5. CAD: stable, cardiac enzymes negative -Fu echo  6. ESRD:  - Renal notified, gets HD TTS -midodrine TID  7. Anemia of chronic disease:  - aranesp per renal  DVt proph: lovenox  Code Status: Full code Family Communication: none at bedside Disposition Plan: to be determined, PT/OT eval pending   Consultants:  Renal  Antibiotics:  VAnc  Aztreonam  Levaquin  HPI/Subjective: Feels weak, no more abdominal pain,  No N/V, no further fevers  Objective: Filed Vitals:   05/09/12 0930 05/09/12 1000 05/09/12 1006 05/09/12 1044  BP: 98/57 85/71 102/44 80/60  Pulse: 84 99 72 65  Temp:   97.5 F (36.4 C) 98.2 F (36.8 C)  TempSrc:   Oral Oral  Resp: 18 19 14 16   Height:      Weight:   107.1 kg (236 lb 1.8 oz)   SpO2: 100% 100% 100% 100%    Intake/Output Summary (Last 24 hours) at 05/09/12 1236 Last data filed at 05/09/12 1006  Gross per 24 hour  Intake     35 ml  Output   3660 ml  Net  -3625 ml   Filed Weights   05/08/12 2039 05/09/12 0623 05/09/12  1006  Weight: 112.6 kg (248 lb 3.8 oz) 112.5 kg (248 lb 0.3 oz) 107.1 kg (236 lb 1.8 oz)    Exam:   General:  AAO  L subclavian HD catheter  Cardiovascular: S1S2/RRR  Respiratory: CTAB  Abdomen: soft, NT, BS present  Ext: trace edema c/c  Data Reviewed: Basic Metabolic Panel:  Recent Labs Lab 05/07/12 1518 05/07/12 1933 05/08/12 0900 05/09/12 0600  NA 135  --  134* 133*  K 3.8  --  4.3 4.9  CL 94*  --  93* 92*  CO2 25  --  27 25  GLUCOSE 126*  --  110* 207*  BUN 38*  --  42* 55*  CREATININE 6.34* 6.61* 7.65* 9.13*  CALCIUM 9.4  --  8.6 8.8  PHOS  --   --  5.5*  --    Liver Function Tests:  Recent Labs Lab 05/07/12 1518 05/08/12 0900  AST 17  --   ALT 16  --   ALKPHOS 67  --   BILITOT 0.2*  --   PROT 6.6  --   ALBUMIN 3.1* 3.1*   No results found for this basename: LIPASE, AMYLASE,  in the last 168 hours No results found for this basename: AMMONIA,  in the last 168 hours CBC:  Recent Labs Lab 05/07/12 1518 05/07/12 1933 05/08/12 0900 05/09/12 0600  WBC 11.6* 10.2 8.0 9.4  NEUTROABS 6.8  --   --   --  HGB 10.7* 11.2* 10.5* 9.9*  HCT 33.2* 35.5* 32.7* 31.2*  MCV 95.4 95.2 94.0 97.5  PLT 266 246 236 259   Cardiac Enzymes:  Recent Labs Lab 05/07/12 1519 05/07/12 1933 05/08/12 0203 05/08/12 0900  TROPONINI <0.30 <0.30 <0.30 <0.30   BNP (last 3 results) No results found for this basename: PROBNP,  in the last 8760 hours CBG:  Recent Labs Lab 05/08/12 0817 05/08/12 0844 05/08/12 1234 05/08/12 1717 05/08/12 2037  GLUCAP 66* 114* 162* 184* 179*    Recent Results (from the past 240 hour(s))  CULTURE, BLOOD (ROUTINE X 2)     Status: None   Collection Time    04/30/12  6:40 PM      Result Value Range Status   Specimen Description BLOOD ARM LEFT   Final   Special Requests BOTTLES DRAWN AEROBIC AND ANAEROBIC 10CC   Final   Culture  Setup Time 04/30/2012 22:52   Final   Culture     Final   Value: STAPHYLOCOCCUS EPIDERMIDIS      Note: Gram Stain Report Called to,Read Back By and Verified With: JANET GREEN 05/06/12 1430 BY MDDAN   Report Status 05/08/2012 FINAL   Final   Organism ID, Bacteria STAPHYLOCOCCUS EPIDERMIDIS   Final  CULTURE, BLOOD (ROUTINE X 2)     Status: None   Collection Time    04/30/12  6:50 PM      Result Value Range Status   Specimen Description BLOOD ARM LEFT   Final   Special Requests BOTTLES DRAWN AEROBIC AND ANAEROBIC 10CC   Final   Culture  Setup Time 04/30/2012 22:52   Final   Culture NO GROWTH 5 DAYS   Final   Report Status 05/06/2012 FINAL   Final  CLOSTRIDIUM DIFFICILE BY PCR     Status: None   Collection Time    04/30/12  7:08 PM      Result Value Range Status   C difficile by pcr NEGATIVE  NEGATIVE Final  CULTURE, BLOOD (ROUTINE X 2)     Status: None   Collection Time    05/07/12  3:35 PM      Result Value Range Status   Specimen Description BLOOD BLOOD LEFT FOREARM   Final   Special Requests BOTTLES DRAWN AEROBIC ONLY 2CC   Final   Culture  Setup Time 05/08/2012 00:43   Final   Culture     Final   Value:        BLOOD CULTURE RECEIVED NO GROWTH TO DATE CULTURE WILL BE HELD FOR 5 DAYS BEFORE ISSUING A FINAL NEGATIVE REPORT   Report Status PENDING   Incomplete  CULTURE, BLOOD (ROUTINE X 2)     Status: None   Collection Time    05/07/12  3:41 PM      Result Value Range Status   Specimen Description BLOOD HAND LEFT   Final   Special Requests BOTTLES DRAWN AEROBIC ONLY 5CC   Final   Culture  Setup Time 05/08/2012 00:43   Final   Culture     Final   Value:        BLOOD CULTURE RECEIVED NO GROWTH TO DATE CULTURE WILL BE HELD FOR 5 DAYS BEFORE ISSUING A FINAL NEGATIVE REPORT   Report Status PENDING   Incomplete     Studies: Ct Head Wo Contrast  05/07/2012  *RADIOLOGY REPORT*  Clinical Data: Altered mental status  CT HEAD WITHOUT CONTRAST  Technique:  Contiguous axial images were obtained from the  base of the skull through the vertex without contrast.  Comparison: 12/11/2011   Findings: No skull fracture is noted.  There is mild mucosal thickening right sphenoid sinus.  The mastoid air cells are unremarkable.  No intracranial hemorrhage, mass effect or midline shift.  Stable cerebral atrophy.  Stable periventricular and patchy subcortical chronic white matter disease.  No acute infarction.  No mass lesion is noted on this unenhanced scan.  IMPRESSION: No acute intracranial abnormality.  Stable atrophy and chronic white matter disease.   Original Report Authenticated By: Natasha Mead, M.D.    Ct Abdomen Pelvis W Contrast  05/07/2012  *RADIOLOGY REPORT*  Clinical Data: Old male abdominal pain.  Hypertension.  CT ABDOMEN AND PELVIS WITH CONTRAST  Technique:  Multidetector CT imaging of the abdomen and pelvis was performed following the standard protocol during bolus administration of intravenous contrast.  Contrast: OMNIPAQUE IOHEXOL 300 MG/ML  SOLN  Comparison: 04/30/2012 and earlier.  Findings: Stable lung bases.  Small lung nodules in the right costophrenic angle again noted, these are only minimally increased in 2011 most compatible with benign etiology.  No pericardial or pleural effusion. Stable small posterior mediastinal lymph nodes lateral to the thoracic  esophagus.  Chronic left posterior lateral rib fractures.  Chronic mild to moderate lumbar compression fractures. Stable visualized osseous structures.  Postoperative changes from left hip arthroplasty with streak artifact.  Stable small fat containing right inguinal hernia.  No pelvic free fluid is evident.  The bladder is decompressed.  Unchanged mild wall thickening in the distal rectum which is decompressed as before.  The sigmoid is redundant extending up into the right abdomen but otherwise within normal limits.  Negative left colon and transverse colon.  The ascending colon today is less decompressed.  A 2-3 cm segment which is nondistended (coronal image 72) is felt to be physiologic. Oral contrast has reached the  transverse colon.  No pericecal inflammation.  The appendix is diminutive or absent.  Terminal ileum within normal limits.  No dilated small bowel.  Stomach and duodenum within normal limits.  Liver, gallbladder, spleen, pancreas and adrenal glands are stable and within normal limits.  Unchanged renal atrophy.  Insufficient contrast in the portal venous system today.  Major arterial structures in the abdomen pelvis are stable and appear patent despite widespread atherosclerosis.  No abdominal free fluid or free air.  No focal intra-abdominal or intrapelvic inflammatory stranding.  There is a small area of stranding in the right ventral abdomen subcutaneous soft tissues (series 2 image 7) with probably relate to recent subcutaneous injection.  IMPRESSION: Stable CT appearance of the abdomen and pelvis with no focal inflammatory process identified.   Original Report Authenticated By: Erskine Speed, M.D.    Dg Chest Port 1 View  05/07/2012  *RADIOLOGY REPORT*  Clinical Data: Altered mental status, dialysis patient, history of COPD and CHF  PORTABLE CHEST - 1 VIEW  Comparison: 02/14/2012; 01/01/2012  Findings:  Grossly unchanged enlarged cardiac silhouette and mediastinal contours with apparent differences attributable to decreased lung volumes and AP projection. Stable positioning of support apparatus. The pulmonary vasculature is less distinct on the present examination with cephalization of flow.  No focal airspace opacities.  No definite pleural effusion or pneumothorax. Unchanged bones.  IMPRESSION: Suspected mild pulmonary edema on this hypoventilated AP supine examination.   Original Report Authenticated By: Tacey Ruiz, MD     Scheduled Meds: . artificial tears  1 application Right Eye BID  . brimonidine  1  drop Both Eyes BID  . cinacalcet  30 mg Oral Daily  . enoxaparin (LOVENOX) injection  30 mg Subcutaneous Q24H  . insulin aspart  0-9 Units Subcutaneous TID WC  . midodrine  10 mg Oral TID WC  .  pantoprazole  40 mg Oral Daily  . simvastatin  20 mg Oral q1800  . sodium chloride  3 mL Intravenous Q12H  . sodium chloride  3 mL Intravenous Q12H  . cyanocobalamin  1,000 mcg Oral Daily   Continuous Infusions:   Active Problems:   Weakness generalized   Diabetes mellitus   CAD (coronary artery disease)    Time spent:30min    [facility]  [facility] Pager [phone]. If 7PM-7AM, please contact night-coverage at [email or web address], password [facility] 05/09/2012, 12:36 PM  LOS: 2 days

## 2012-05-09 NOTE — Procedures (Signed)
I was present at this dialysis session. I have reviewed the session itself and made appropriate changes.   Vinson Moselle, MD [employer] 05/09/2012, 10:11 AM

## 2012-05-10 LAB — BASIC METABOLIC PANEL
CO2: 25 mEq/L (ref 19–32)
Chloride: 94 mEq/L — ABNORMAL LOW (ref 96–112)
Creatinine, Ser: 8.2 mg/dL — ABNORMAL HIGH (ref 0.50–1.35)

## 2012-05-10 LAB — CBC
HCT: 33.1 % — ABNORMAL LOW (ref 39.0–52.0)
MCV: 97.9 fL (ref 78.0–100.0)
Platelets: 233 10*3/uL (ref 150–400)
RBC: 3.38 MIL/uL — ABNORMAL LOW (ref 4.22–5.81)
WBC: 8.1 10*3/uL (ref 4.0–10.5)

## 2012-05-10 LAB — GLUCOSE, CAPILLARY
Glucose-Capillary: 243 mg/dL — ABNORMAL HIGH (ref 70–99)
Glucose-Capillary: 303 mg/dL — ABNORMAL HIGH (ref 70–99)

## 2012-05-10 MED ORDER — VANCOMYCIN HCL IN DEXTROSE 1-5 GM/200ML-% IV SOLN: 1000.0000 mg | INTRAVENOUS | Status: DC

## 2012-05-10 MED ORDER — VANCOMYCIN HCL 500 MG IV SOLR
500.0000 mg | Freq: Once | INTRAVENOUS | Status: AC
  Administered 2012-05-10: 500 mg via INTRAVENOUS
  Filled 2012-05-10: qty 500

## 2012-05-10 NOTE — Progress Notes (Signed)
ANTIBIOTIC CONSULT NOTE - INITIAL  Pharmacy Consult for :  Vancomycin Indication: Possible HD cath infection  Hospital Problems: Active Problems:   Weakness generalized   Diabetes mellitus   CAD (coronary artery disease)   Allergies: Allergies  Allergen Reactions  . Cefepime Itching  . Penicillins Hives and Swelling    Patient Measurements: Height: 5' 11.5" (181.6 cm) Weight: 235 lb 7.2 oz (106.8 kg) IBW/kg (Calculated) : 76.45  Vital Signs: BP 99/50  Pulse 81  Temp(Src) 97.9 F (36.6 C) (Oral)  Resp 16  Ht 5' 11.5" (1.816 m)  Wt 235 lb 7.2 oz (106.8 kg)  BMI 32.38 kg/m2  SpO2 98%  Labs:  Recent Labs  05/08/12 0900 05/09/12 0600 05/10/12 0800  WBC 8.0 9.4 8.1  HGB 10.5* 9.9* 10.3*  PLT 236 259 233  CREATININE 7.65* 9.13* 8.20*   Estimated Creatinine Clearance: 11.3 ml/min (by C-G formula based on Cr of 8.2).   Microbiology: Recent Results (from the past 720 hour(s))  CULTURE, BLOOD (ROUTINE X 2)     Status: None   Collection Time    04/30/12  6:40 PM      Result Value Range Status   Specimen Description BLOOD ARM LEFT   Final   Special Requests BOTTLES DRAWN AEROBIC AND ANAEROBIC 10CC   Final   Culture  Setup Time 04/30/2012 22:52   Final   Culture     Final   Value: STAPHYLOCOCCUS EPIDERMIDIS     Note: Gram Stain Report Called to,Read Back By and Verified With: JANET GREEN 05/06/12 1430 BY MDDAN   Report Status 05/08/2012 FINAL   Final   Organism ID, Bacteria STAPHYLOCOCCUS EPIDERMIDIS   Final  CULTURE, BLOOD (ROUTINE X 2)     Status: None   Collection Time    04/30/12  6:50 PM      Result Value Range Status   Specimen Description BLOOD ARM LEFT   Final   Special Requests BOTTLES DRAWN AEROBIC AND ANAEROBIC 10CC   Final   Culture  Setup Time 04/30/2012 22:52   Final   Culture NO GROWTH 5 DAYS   Final   Report Status 05/06/2012 FINAL   Final  CLOSTRIDIUM DIFFICILE BY PCR     Status: None   Collection Time    04/30/12  7:08 PM      Result Value  Range Status   C difficile by pcr NEGATIVE  NEGATIVE Final  CULTURE, BLOOD (ROUTINE X 2)     Status: None   Collection Time    05/07/12  3:35 PM      Result Value Range Status   Specimen Description BLOOD BLOOD LEFT FOREARM   Final   Special Requests BOTTLES DRAWN AEROBIC ONLY 2CC   Final   Culture  Setup Time 05/08/2012 00:43   Final   Culture     Final   Value:        BLOOD CULTURE RECEIVED NO GROWTH TO DATE CULTURE WILL BE HELD FOR 5 DAYS BEFORE ISSUING A FINAL NEGATIVE REPORT   Report Status PENDING   Incomplete  CULTURE, BLOOD (ROUTINE X 2)     Status: None   Collection Time    05/07/12  3:41 PM      Result Value Range Status   Specimen Description BLOOD HAND LEFT   Final   Special Requests BOTTLES DRAWN AEROBIC ONLY 5CC   Final   Culture  Setup Time 05/08/2012 00:43   Final   Culture  Final   Value:        BLOOD CULTURE RECEIVED NO GROWTH TO DATE CULTURE WILL BE HELD FOR 5 DAYS BEFORE ISSUING A FINAL NEGATIVE REPORT   Report Status PENDING   Incomplete    Medical/Surgical History: Past Medical History  Diagnosis Date  . CHF (congestive heart failure)   . CAD (coronary artery disease) 2006  . COPD (chronic obstructive pulmonary disease)   . Hyperlipidemia   . ESRD (end stage renal disease) on dialysis started 08/2006    MTTS [place]; "getting ready to have it on TTS" (05/07/2012)  . Type II diabetes mellitus     etiology of ESRD  . Diabetic blindness   . Hypertension   . MI, old 2006  . Asthma   . Pneumonia     "everytime I take a cold" (05/07/2012)  . Bronchiectasis   . Shortness of breath     "can happen at anytime" (05/07/2012)  . GERD (gastroesophageal reflux disease)   . Stroke 2006    "numb on left side since"  05/07/2012  . On home oxygen therapy     "3L at night mostly; also prn" (05/07/2012)   Past Surgical History  Procedure Laterality Date  . Av fistula placement Right 09/2006    "used it once" (05/07/2012)  . Appendectomy  ~ 1958  . Insertion of dialysis  catheter Left 09/2006    "chest" (05/07/2012)  . Total hip arthroplasty Left 1997  . Repair ankle ligament Left 1981  . Cataract extraction w/ intraocular lens implant Left ?2012    Medications:  Anti-infectives   Start     Dose/Rate Route Frequency Ordered Stop   05/10/12 1130  vancomycin (VANCOCIN) 500 mg in sodium chloride 0.9 % 100 mL IVPB     500 mg 100 mL/hr over 60 Minutes Intravenous  Once 05/10/12 1037     05/09/12 2200  levofloxacin (LEVAQUIN) IVPB 500 mg  Status:  Discontinued     500 mg 100 mL/hr over 60 Minutes Intravenous Every 48 hours 05/07/12 1610 05/08/12 0905   05/09/12 1400  vancomycin (VANCOCIN) IVPB 1000 mg/200 mL premix     1,000 mg 200 mL/hr over 60 Minutes Intravenous  Once 05/09/12 1349 05/09/12 1804   05/08/12 2000  aztreonam (AZACTAM) 1 g in dextrose 5 % 50 mL IVPB  Status:  Discontinued     1 g 100 mL/hr over 30 Minutes Intravenous Every 24 hours 05/07/12 1610 05/08/12 0905   05/07/12 1545  levofloxacin (LEVAQUIN) IVPB 750 mg     750 mg 100 mL/hr over 90 Minutes Intravenous  Once 05/07/12 1533 05/07/12 2332   05/07/12 1545  aztreonam (AZACTAM) 2 g in dextrose 5 % 50 mL IVPB     2 g 100 mL/hr over 30 Minutes Intravenous  Once 05/07/12 1533 05/08/12 0029   05/07/12 1545  vancomycin (VANCOCIN) 2,000 mg in sodium chloride 0.9 % 500 mL IVPB     2,000 mg 250 mL/hr over 120 Minutes Intravenous  Once 05/07/12 1533 05/07/12 1845      Assessment:  67 y/o male s/p Vanc, Aztreonam, and LVQ from admission for presumed sepsis.  These were discontinued due to no evidence of infection at that time.  Since then, patient has had hypotension initally thought to have been a result of volume shifts from HD, but  was febrile to 101.1 F.  WBC's 9.4.  Cultures drawn on admission report NGTD.  The patient has been receiving Vancomycin with HD sessions, but did  not today. Today's HD session was only 2 hours so will dose accordingly  Goal of Therapy:   Pre-HD Vancomycin  levels 15 - 25 mcg/ml  Plan:   Vancomycin 500mg  IV now  Follow up Cx results if negative may not need dose at next HD session  Vania Rea. Darin Engels.D. Clinical Pharmacist Pager [phone] Phone [phone] 05/10/2012 10:42 AM

## 2012-05-10 NOTE — Procedures (Signed)
I was present at this dialysis session. I have reviewed the session itself and made appropriate changes.   Rob Leza Apsey, MD [facility] 05/10/2012, 9:14 AM   

## 2012-05-10 NOTE — Progress Notes (Signed)
[facility] PROGRESS NOTE  [name] QMV:[id number] DOB: 1945-01-03 DOA: 05/07/2012 PCP: [name], MD  Assessment/Plan: 1. Hypotension: -Initially suspected to be from volume shifts with HD, chronic hypotension -febrile 5/8 raising concern for possible HD catheter infection, -started IV Vancomycin 5/9, Fu blood cultures, negative so far from 5/8 -continue Midodrine 10mg  TID -received IVF 5/8 -BP in 80s this am, will monitor after HD and see  2. Generalized weakness /Falls:  -. Head CT scan is negative for acute findings, and patient has no focal neurologic deficit on physical examination. Likely, profound hypotension was the culprit, although his gradual decline may be due to ESRD. Patient per spouse, has had recurrent falls, which may be attributable to orthostasis and debility. PT/OT following Recommended HH services  3. Abd pain: possibly related to hypotension 5/8 -resolved now, diet as tolerated, CT negative -DC USG -continue PPI  4. DM: stable -SSI for now  5. CAD: stable, cardiac enzymes negative -Fu echo  6. ESRD:  - Renal notified, gets HD TTS -midodrine TID  7. Anemia of chronic disease:  - aranesp per renal  DVt proph: lovenox  Code Status: Full code Family Communication: none at bedside Disposition Plan: home when BP stable  Consultants:  Renal  Antibiotics:  VAnc  Aztreonam  Levaquin  HPI/Subjective: Feels weak, no more abdominal pain,  No N/V, no further fevers  Objective: Filed Vitals:   05/10/12 0900 05/10/12 0917 05/10/12 0925 05/10/12 0930  BP: 80/35 80/37 77/28  83/45  Pulse: 86   74  Temp:      TempSrc:      Resp:      Height:      Weight:      SpO2:        Intake/Output Summary (Last 24 hours) at 05/10/12 0947 Last data filed at 05/09/12 1006  Gross per 24 hour  Intake      0 ml  Output   3660 ml  Net  -3660 ml   Filed Weights   05/09/12 1006 05/09/12 2229 05/10/12 0814  Weight: 107.1 kg (236 lb 1.8 oz)  108.1 kg (238 lb 5.1 oz) 108 kg (238 lb 1.6 oz)    Exam:   General:  AAO, sleepy, seen on HD  L subclavian HD catheter  Cardiovascular: S1S2/RRR  Respiratory: CTAB  Abdomen: soft, NT, BS present  Ext: trace edema c/c  Data Reviewed: Basic Metabolic Panel:  Recent Labs Lab 05/07/12 1518 05/07/12 1933 05/08/12 0900 05/09/12 0600 05/10/12 0800  NA 135  --  134* 133* 134*  K 3.8  --  4.3 4.9 5.0  CL 94*  --  93* 92* 94*  CO2 25  --  27 25 25   GLUCOSE 126*  --  110* 207* 245*  BUN 38*  --  42* 55* 44*  CREATININE 6.34* 6.61* 7.65* 9.13* 8.20*  CALCIUM 9.4  --  8.6 8.8 8.7  PHOS  --   --  5.5*  --   --    Liver Function Tests:  Recent Labs Lab 05/07/12 1518 05/08/12 0900  AST 17  --   ALT 16  --   ALKPHOS 67  --   BILITOT 0.2*  --   PROT 6.6  --   ALBUMIN 3.1* 3.1*   No results found for this basename: LIPASE, AMYLASE,  in the last 168 hours No results found for this basename: AMMONIA,  in the last 168 hours CBC:  Recent Labs Lab 05/07/12 1518 05/07/12 1933 05/08/12 0900 05/09/12  0600 05/10/12 0800  WBC 11.6* 10.2 8.0 9.4 8.1  NEUTROABS 6.8  --   --   --   --   HGB 10.7* 11.2* 10.5* 9.9* 10.3*  HCT 33.2* 35.5* 32.7* 31.2* 33.1*  MCV 95.4 95.2 94.0 97.5 97.9  PLT 266 246 236 259 233   Cardiac Enzymes:  Recent Labs Lab 05/07/12 1519 05/07/12 1933 05/08/12 0203 05/08/12 0900  TROPONINI <0.30 <0.30 <0.30 <0.30   BNP (last 3 results) No results found for this basename: PROBNP,  in the last 8760 hours CBG:  Recent Labs Lab 05/08/12 2037 05/09/12 1037 05/09/12 1320 05/09/12 1709 05/09/12 2224  GLUCAP 179* 154* 279* 208* 217*    Recent Results (from the past 240 hour(s))  CULTURE, BLOOD (ROUTINE X 2)     Status: None   Collection Time    04/30/12  6:40 PM      Result Value Range Status   Specimen Description BLOOD ARM LEFT   Final   Special Requests BOTTLES DRAWN AEROBIC AND ANAEROBIC 10CC   Final   Culture  Setup Time 04/30/2012  22:52   Final   Culture     Final   Value: STAPHYLOCOCCUS EPIDERMIDIS     Note: Gram Stain Report Called to,Read Back By and Verified With: JANET GREEN 05/06/12 1430 BY MDDAN   Report Status 05/08/2012 FINAL   Final   Organism ID, Bacteria STAPHYLOCOCCUS EPIDERMIDIS   Final  CULTURE, BLOOD (ROUTINE X 2)     Status: None   Collection Time    04/30/12  6:50 PM      Result Value Range Status   Specimen Description BLOOD ARM LEFT   Final   Special Requests BOTTLES DRAWN AEROBIC AND ANAEROBIC 10CC   Final   Culture  Setup Time 04/30/2012 22:52   Final   Culture NO GROWTH 5 DAYS   Final   Report Status 05/06/2012 FINAL   Final  CLOSTRIDIUM DIFFICILE BY PCR     Status: None   Collection Time    04/30/12  7:08 PM      Result Value Range Status   C difficile by pcr NEGATIVE  NEGATIVE Final  CULTURE, BLOOD (ROUTINE X 2)     Status: None   Collection Time    05/07/12  3:35 PM      Result Value Range Status   Specimen Description BLOOD BLOOD LEFT FOREARM   Final   Special Requests BOTTLES DRAWN AEROBIC ONLY 2CC   Final   Culture  Setup Time 05/08/2012 00:43   Final   Culture     Final   Value:        BLOOD CULTURE RECEIVED NO GROWTH TO DATE CULTURE WILL BE HELD FOR 5 DAYS BEFORE ISSUING A FINAL NEGATIVE REPORT   Report Status PENDING   Incomplete  CULTURE, BLOOD (ROUTINE X 2)     Status: None   Collection Time    05/07/12  3:41 PM      Result Value Range Status   Specimen Description BLOOD HAND LEFT   Final   Special Requests BOTTLES DRAWN AEROBIC ONLY 5CC   Final   Culture  Setup Time 05/08/2012 00:43   Final   Culture     Final   Value:        BLOOD CULTURE RECEIVED NO GROWTH TO DATE CULTURE WILL BE HELD FOR 5 DAYS BEFORE ISSUING A FINAL NEGATIVE REPORT   Report Status PENDING   Incomplete     Studies:  No results found.  Scheduled Meds: . artificial tears  1 application Right Eye BID  . brimonidine  1 drop Both Eyes BID  . cinacalcet  30 mg Oral Daily  . enoxaparin (LOVENOX)  injection  30 mg Subcutaneous Q24H  . insulin aspart  0-5 Units Subcutaneous QHS  . insulin aspart  0-9 Units Subcutaneous TID WC  . midodrine  10 mg Oral TID WC  . pantoprazole  40 mg Oral Daily  . simvastatin  20 mg Oral q1800  . sodium chloride  3 mL Intravenous Q12H  . sodium chloride  3 mL Intravenous Q12H  . cyanocobalamin  1,000 mcg Oral Daily   Continuous Infusions:   Active Problems:   Weakness generalized   Diabetes mellitus   CAD (coronary artery disease)    Time spent:9min    [facility]  [facility] Pager [phone]. If 7PM-7AM, please contact night-coverage at [email or web address], password [facility] 05/10/2012, 9:47 AM  LOS: 3 days

## 2012-05-10 NOTE — Progress Notes (Signed)
Subjective:  No complaints, no dizziness, breathing well.  Objective: Vital signs in last 24 hours: Temp:  [97.5 F (36.4 C)-99.4 F (37.4 C)] 98.9 F (37.2 C) (05/09 2229) Pulse Rate:  [63-99] 90 (05/09 2229) Resp:  [11-19] 16 (05/09 2229) BP: (80-125)/(39-79) 94/39 mmHg (05/09 2229) SpO2:  [80 %-100 %] 95 % (05/09 2229) Weight:  [107.1 kg (236 lb 1.8 oz)-108.1 kg (238 lb 5.1 oz)] 108.1 kg (238 lb 5.1 oz) (05/09 2229) Weight change: -5.5 kg (-12 lb 2 oz)  Intake/Output from previous day: 05/09 0701 - 05/10 0700 In: -  Out: 3660    EXAM: General appearance:  Alert, in no apparent distress Resp:  CTA without rales, rhonchi, or wheezes Cardio:  RRR without murmur or rub GI:  + BS, soft and nontender Extremities:  No edema Access:  Left IJ catheter  Lab Results:  Recent Labs  05/08/12 0900 05/09/12 0600  WBC 8.0 9.4  HGB 10.5* 9.9*  HCT 32.7* 31.2*  PLT 236 259   BMET:  Recent Labs  05/07/12 1518  05/08/12 0900 05/09/12 0600  NA 135  --  134* 133*  K 3.8  --  4.3 4.9  CL 94*  --  93* 92*  CO2 25  --  27 25  GLUCOSE 126*  --  110* 207*  BUN 38*  --  42* 55*  CREATININE 6.34*  < > 7.65* 9.13*  CALCIUM 9.4  --  8.6 8.8  ALBUMIN 3.1*  --  3.1*  --   < > = values in this interval not displayed. No results found for this basename: PTH,  in the last 72 hours Iron Studies: No results found for this basename: IRON, TIBC, TRANSFERRIN, FERRITIN,  in the last 72 hours  Outpatient HD: [facility] MTTS 3hr 105kg EDW F180 450/800 2/2.5 Bath L IJ cath EPO 1800 Hectorol 2ug Heparin 9000  Assessment/Plan: 1. Hypotension - BP 94/39 most recently, on Midodrine 10 mg tid; fever 2 days ago, none recently; catheter site clean; Blood cultures 5/8 are negative to date. OK for discharge after HD today, will continue Vanc at HD until final cx results available 2. ESRD - HD on MTTS @ [facility]. HD pending today. 3. Volume/BP - on Midodrine; current wt 108.1 kg s/p net UF 3.7 L yesterday, 3.1  over EDW.   UF goal of 2.5 kg today as tolerated. 4. Anemia of CKD - Hgb 9.9, Epogen on hold.  Recheck CBC today. 5. 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 6. COPD on home O2 7. Hx MI 8. Hx CVA '06    LOS: 3 days   LYLES,CHARLES 05/10/2012,6:54 AM  Patient seen and examined.  I agree with plan as above with additions as indicated. Vinson Moselle  MD [phone] pgr    [phone] cell 05/10/2012, 9:17 AM

## 2012-05-11 MED ORDER — VANCOMYCIN HCL IN DEXTROSE 1-5 GM/200ML-% IV SOLN: 1000.0000 mg | INTRAVENOUS | Status: DC

## 2012-05-11 MED ORDER — MIDODRINE HCL 10 MG PO TABS: 10.0000 mg | ORAL_TABLET | Freq: Three times a day (TID) | ORAL | Status: DC

## 2012-05-11 MED ORDER — INSULIN ASPART 100 UNIT/ML ~~LOC~~ SOLN: 6.0000 [IU] | Freq: Three times a day (TID) | SUBCUTANEOUS | Status: DC

## 2012-05-11 MED ORDER — INSULIN GLARGINE 100 UNIT/ML ~~LOC~~ SOLN: 20.0000 [IU] | Freq: Every day | SUBCUTANEOUS | Status: DC

## 2012-05-11 NOTE — Progress Notes (Signed)
Patient discharge Home per Md order.  Discharge instructions reviewed with patient and family.  Copies of all forms given and explained. Patient/family voiced understanding of all instructions.  Discharge in no acute distress. Markcus Lazenby B. Daianna Vasques, RN BC, BSN, MSN 

## 2012-05-11 NOTE — Progress Notes (Signed)
Subjective:   No complaints, no nausea, no dizziness, breathing well.  Objective: Vital signs in last 24 hours: Temp:  [97.9 F (36.6 C)-98.4 F (36.9 C)] 98.1 F (36.7 C) (05/11 0436) Pulse Rate:  [74-99] 92 (05/11 0436) Resp:  [16-21] 18 (05/11 0436) BP: (77-133)/(28-77) 119/77 mmHg (05/11 0436) SpO2:  [95 %-100 %] 98 % (05/11 0436) Weight:  [106.8 kg (235 lb 7.2 oz)-108 kg (238 lb 1.6 oz)] 106.8 kg (235 lb 7.2 oz) (05/10 1020) Weight change: 0.9 kg (1 lb 15.8 oz)  Intake/Output from previous day: 05/10 0701 - 05/11 0700 In: 360 [P.O.:360] Out: 1635    EXAM: General appearance:  Alert, in no apparent distress Resp:  CTA without rales, rhonchi, or wheees Cardio:  RRR without murmur or rub GI:  + BS, soft and nontender Extremities:  No edema Access:  Left IJ catheter  Lab Results:  Recent Labs  05/09/12 0600 05/10/12 0800  WBC 9.4 8.1  HGB 9.9* 10.3*  HCT 31.2* 33.1*  PLT 259 233   BMET:  Recent Labs  05/08/12 0900 05/09/12 0600 05/10/12 0800  NA 134* 133* 134*  K 4.3 4.9 5.0  CL 93* 92* 94*  CO2 27 25 25   GLUCOSE 110* 207* 245*  BUN 42* 55* 44*  CREATININE 7.65* 9.13* 8.20*  CALCIUM 8.6 8.8 8.7  ALBUMIN 3.1*  --   --    No results found for this basename: PTH,  in the last 72 hours Iron Studies: No results found for this basename: IRON, TIBC, TRANSFERRIN, FERRITIN,  in the last 72 hours  Outpatient HD: [facility] MTTS 3hr 105kg EDW F180 450/800 2/2.5 Bath L IJ cath EPO 1800 Hectorol 2ug Heparin 9000  Assessment/Plan: 1. Hypotension - BP 94/39 most recently, on Midodrine 10 mg tid; now afebrile; catheter site clean; bood cultures 5/8 negative to date, continue Vanc at HD until final cx results available. 2. ESRD - HD on MTTS @ [facility], K 5 pre-HD yesterday.  Next HD on 5/13. 3. Volume/BP - on Midodrine; current wt 106.8 kg s/p net UF 1.6 L yesterday, 1.8 over EDW. 4. Anemia of CKD - Hgb 10.3, Epogen on hold.  5. Secondary HPTH - Ca 8.7 (9.4 corrected), P  5.5; Hectorol 2 mcg, Sensipar 30 mg qd. 6. COPD on home O2 7. Hx MI 8. Hx CVA '06    LOS: 4 days   [name],[name] 05/11/2012,6:39 AM  Patient seen and examined.  Agree with assessment and plan as above. Vinson Moselle  MD [phone] pgr    [phone] cell 05/11/2012, 12:07 PM

## 2012-05-11 NOTE — Progress Notes (Signed)
   CARE MANAGEMENT NOTE 05/11/2012  Patient:  [name], [name]   Account Number:  [id number]  Date Initiated:  05/09/2012  Documentation initiated by:  ROYAL,CHERYL  Subjective/Objective Assessment:     Action/Plan:   Met with pt on 5/8 re d/c needs. Pt has HH aide from [place] for 2hr, 2x/wk. Spoke with pt wife by phone who reports needing more help. Await PT and OT eval.   Anticipated DC Date:  05/11/2012   Anticipated DC Plan:  HOME W HOME HEALTH SERVICES      DC Planning Services  CM consult      [facility] Choice  HOME HEALTH   Choice offered to / List presented to:  C-1 Patient        HH arranged  HH-2 PT  HH-3 OT  HH-1 RN  HH-4 NURSE'S AIDE      HH agency  [facility]   Status of service:  Completed, signed off Medicare Important Message given?   (If response is "NO", the following Medicare IM given date fields will be blank) Date Medicare IM given:   Date Additional Medicare IM given:    Discharge Disposition:  HOME W HOME HEALTH SERVICES  Per UR Regulation:    If discussed at Long Length of Stay Meetings, dates discussed:    Comments:  05/11/2012 1100 NCM spoke to pt and states he had [facility] prior to admission. Faxed notification to [facility] of resumption of care with [facility] RN and aide added to orders. Isidoro Donning RN CCM Case Mgmt phone [phone]

## 2012-05-14 LAB — CULTURE, BLOOD (ROUTINE X 2): Culture: NO GROWTH

## 2012-05-15 LAB — CULTURE, BLOOD (ROUTINE X 2)
Culture: NO GROWTH
Culture: NO GROWTH

## 2012-05-18 NOTE — ED Provider Notes (Signed)
History     CSN: [id number]  Arrival date & time 04/30/12  1753   First MD Initiated Contact with Patient 04/30/12 1803      Chief Complaint  Patient presents with  . Weakness  . Altered Mental Status    (Consider location/radiation/quality/duration/timing/severity/associated sxs/prior treatment) HPI Comments: Patient presents to the ER for evaluation of generalized weakness, mental status changes and diarrhea. He denies any abdominal discomfort. He has not had any fever. Patient denies chest pain, shortness of breath as well as vomiting.  Patient is a 67 y.o. male presenting with weakness and altered mental status.  Weakness  Altered Mental Status    Past Medical History  Diagnosis Date  . CHF (congestive heart failure)   . CAD (coronary artery disease) 2006  . COPD (chronic obstructive pulmonary disease)   . Hyperlipidemia   . ESRD (end stage renal disease) on dialysis started 08/2006    MTTS [place]; "getting ready to have it on TTS" (05/07/2012)  . Type II diabetes mellitus     etiology of ESRD  . Diabetic blindness   . Hypertension   . MI, old 2006  . Asthma   . Pneumonia     "everytime I take a cold" (05/07/2012)  . Bronchiectasis   . Shortness of breath     "can happen at anytime" (05/07/2012)  . GERD (gastroesophageal reflux disease)   . Stroke 2006    "numb on left side since"  05/07/2012  . On home oxygen therapy     "3L at night mostly; also prn" (05/07/2012)    Past Surgical History  Procedure Laterality Date  . Av fistula placement Right 09/2006    "used it once" (05/07/2012)  . Appendectomy  ~ 1958  . Insertion of dialysis catheter Left 09/2006    "chest" (05/07/2012)  . Total hip arthroplasty Left 1997  . Repair ankle ligament Left 1981  . Cataract extraction w/ intraocular lens implant Left ?2012    Family History  Problem Relation Age of Onset  . Diabetes Mellitus II Father     History  Substance Use Topics  . Smoking status: Former Smoker --  2.00 packs/day for 30 years    Types: Cigarettes    Quit date: 01/02/1984  . Smokeless tobacco: Never Used  . Alcohol Use: Yes     Comment: 05/07/2012 "stopped drinking 1979"      Review of Systems  Neurological: Positive for weakness.  Psychiatric/Behavioral: Positive for confusion and altered mental status.  All other systems reviewed and are negative.    Allergies  Cefepime and Penicillins  Home Medications   Current Outpatient Rx  Name  Route  Sig  Dispense  Refill  . aspirin 325 MG tablet   Oral   Take 325 mg by mouth daily.         . calcium acetate (PHOSLO) 667 MG capsule   Oral   Take 4 capsules (2,668 mg total) by mouth 3 (three) times daily with meals.   360 capsule   0   . folic acid-vitamin b complex-vitamin c-selenium-zinc (DIALYVITE) 3 MG TABS   Oral   Take 1 tablet by mouth daily.         Marland Kitchen omeprazole (PRILOSEC) 20 MG capsule   Oral   Take 20 mg by mouth 2 (two) times daily.          Marland Kitchen oxycodone (OXY-IR) 5 MG capsule   Oral   Take 10 mg by mouth every 6 (six) hours  as needed for pain.          . pravastatin (PRAVACHOL) 40 MG tablet   Oral   Take 40 mg by mouth at bedtime.          Marland Kitchen rOPINIRole (REQUIP) 0.25 MG tablet   Oral   Take 0.25-0.5 mg by mouth 3 (three) times daily. 1 tablet twice a day and 2 tablets at bedtime.         . sennosides-docusate sodium (SENOKOT-S) 8.6-50 MG tablet   Oral   Take 1 tablet by mouth 2 (two) times daily as needed for constipation. For constipation         . artificial tears (LACRILUBE) OINT ophthalmic ointment   Right Eye   Place 1 application into the right eye 2 (two) times daily.         . brimonidine (ALPHAGAN) 0.2 % ophthalmic solution   Both Eyes   Place 1 drop into both eyes 2 (two) times daily.         . carboxymethylcellulose (REFRESH CELLUVISC) 1 % ophthalmic solution   Both Eyes   Place 1 drop into both eyes every 2 (two) hours.         . cinacalcet (SENSIPAR) 30 MG  tablet   Oral   Take 30 mg by mouth daily.         . cyanocobalamin 500 MCG tablet   Oral   Take 1,000 mcg by mouth daily.         Marland Kitchen gabapentin (NEURONTIN) 100 MG capsule   Oral   Take 300 mg by mouth at bedtime as needed (nerve pain).         . insulin aspart (NOVOLOG) 100 UNIT/ML injection   Subcutaneous   Inject 6 Units into the skin 3 (three) times daily before meals.   1 vial   0   . insulin glargine (LANTUS) 100 UNIT/ML injection   Subcutaneous   Inject 0.2 mLs (20 Units total) into the skin at bedtime.   10 mL      . midodrine (PROAMATINE) 10 MG tablet   Oral   Take 1 tablet (10 mg total) by mouth 3 (three) times daily.   30 tablet   0   . vancomycin (VANCOCIN) 1 GM/200ML SOLN   Intravenous   Inject 200 mLs (1,000 mg total) into the vein Every Tuesday,Thursday,and Saturday with dialysis. For 2 weeks   4000 mL        BP 122/77  Pulse 98  Temp(Src) 98.2 F (36.8 C) (Oral)  Resp 16  Ht 5' 11.5" (1.816 m)  Wt 244 lb 11.4 oz (111 kg)  BMI 33.66 kg/m2  SpO2 98%  Physical Exam  Constitutional: He is oriented to person, place, and time. He appears well-developed and well-nourished. He appears listless. No distress.  HENT:  Head: Normocephalic and atraumatic.  Right Ear: Hearing normal.  Left Ear: Hearing normal.  Nose: Nose normal.  Mouth/Throat: Oropharynx is clear and moist and mucous membranes are normal.  Eyes: Conjunctivae and EOM are normal. Pupils are equal, round, and reactive to light.  Neck: Normal range of motion. Neck supple.  Cardiovascular: Regular rhythm, S1 normal and S2 normal.  Exam reveals no gallop and no friction rub.   No murmur heard. Pulmonary/Chest: Effort normal and breath sounds normal. No respiratory distress. He exhibits no tenderness.  Abdominal: Soft. Normal appearance and bowel sounds are normal. There is no hepatosplenomegaly. There is no tenderness. There is no rebound, no guarding,  no tenderness at McBurney's point  and negative Murphy's sign. No hernia.  Musculoskeletal: Normal range of motion.  Neurological: He is oriented to person, place, and time. He has normal strength. He appears listless. No cranial nerve deficit or sensory deficit. Coordination normal. GCS eye subscore is 4. GCS verbal subscore is 5. GCS motor subscore is 6.  Skin: Skin is warm, dry and intact. No rash noted. No cyanosis.  Psychiatric: He has a normal mood and affect. His speech is normal and behavior is normal. Thought content normal.    ED Course  Procedures (including critical care time)  Labs Reviewed  CBC - Abnormal; Notable for the following:    WBC 15.0 (*)    RBC 3.82 (*)    Hemoglobin 11.8 (*)    HCT 35.7 (*)    All other components within normal limits  COMPREHENSIVE METABOLIC PANEL - Abnormal; Notable for the following:    Sodium 134 (*)    Chloride 91 (*)    Glucose, Bld 273 (*)    BUN 44 (*)    Creatinine, Ser 7.30 (*)    Albumin 3.4 (*)    Total Bilirubin 0.2 (*)    GFR calc non Af Amer 7 (*)    GFR calc Af Amer 8 (*)    All other components within normal limits  LACTIC ACID, PLASMA - Abnormal; Notable for the following:    Lactic Acid, Venous 2.3 (*)    All other components within normal limits  GLUCOSE, CAPILLARY - Abnormal; Notable for the following:    Glucose-Capillary 219 (*)    All other components within normal limits  GLUCOSE, CAPILLARY - Abnormal; Notable for the following:    Glucose-Capillary 166 (*)    All other components within normal limits  COMPREHENSIVE METABOLIC PANEL - Abnormal; Notable for the following:    Chloride 95 (*)    BUN 46 (*)    Creatinine, Ser 8.03 (*)    Albumin 3.2 (*)    GFR calc non Af Amer 6 (*)    GFR calc Af Amer 7 (*)    All other components within normal limits  CBC - Abnormal; Notable for the following:    WBC 13.3 (*)    RBC 3.59 (*)    Hemoglobin 10.9 (*)    HCT 33.2 (*)    All other components within normal limits  GLUCOSE, CAPILLARY -  Abnormal; Notable for the following:    Glucose-Capillary 242 (*)    All other components within normal limits  GLUCOSE, CAPILLARY - Abnormal; Notable for the following:    Glucose-Capillary 133 (*)    All other components within normal limits  GLUCOSE, CAPILLARY - Abnormal; Notable for the following:    Glucose-Capillary 103 (*)    All other components within normal limits  GLUCOSE, CAPILLARY - Abnormal; Notable for the following:    Glucose-Capillary 107 (*)    All other components within normal limits  GLUCOSE, CAPILLARY - Abnormal; Notable for the following:    Glucose-Capillary 67 (*)    All other components within normal limits  GLUCOSE, CAPILLARY - Abnormal; Notable for the following:    Glucose-Capillary 161 (*)    All other components within normal limits  OCCULT BLOOD, POC DEVICE - Abnormal; Notable for the following:    Fecal Occult Bld POSITIVE (*)    All other components within normal limits  CLOSTRIDIUM DIFFICILE BY PCR  CULTURE, BLOOD (ROUTINE X 2)  CULTURE, BLOOD (ROUTINE X 2)  STOOL CULTURE  STOOL CULTURE  LIPASE, BLOOD  TROPONIN I  GLUCOSE, CAPILLARY  GLUCOSE, CAPILLARY   No results found.   1. Colitis   2. Diarrhea   3. Dehydration   4. ESRD (end stage renal disease) on dialysis   5. Heme positive stool   6. Altered mental state   7. Anemia   8. DM2 (diabetes mellitus, type 2)       MDM  Initial workup started but not finished the time of shift patent. Case signed out to [facility] [place], [place] for ultimate disposition.        Gilda Crease, MD 05/18/12 0800

## 2012-05-26 NOTE — Discharge Summary (Signed)
Physician Discharge Summary  DONYAE KILNER ZOX:[id number] DOB: [birth year] DOA: 05/07/2012  PCP: [name], MD  Admit date: 05/07/2012 Discharge date: 05/11/2012  Time spent: 50 minutes  Recommendations for Outpatient Follow-up:  1. PCP in 1 week 2. [place]in with HD for 2 weeks  Discharge Diagnoses:  Active Problems:   Weakness generalized   Diabetes mellitus   CAD (coronary artery disease)   Hypotension   Fever   CAD   ESRD on HD   Anemia of chronic disease   Discharge Condition: stable  Diet recommendation: Renal diabetic  Filed Weights   05/09/12 2229 05/10/12 0814 05/10/12 1020  Weight: 108.1 kg (238 lb 5.1 oz) 108 kg (238 lb 1.6 oz) 106.8 kg (235 lb 7.2 oz)    History of present illness:  This is a 67 year old male, with known history of ESRD on HD M-T-T-S, CHF, DM-2, CAD, s/p previous MI, COPD, dyslipidemia, s/p CVA, blindness, chronic hypotension on Midodrine, here for hypotension discovered by home health nurse today. According to patient and his spouse, patient completed HD at about 10:30 AM on 05/06/12, after which he became quite lethargic. He got home at about noon, and was very weak and somewhat confused all day. When [facility] came by at 11:00 AM today, patient was disoriented, and was found to have an exeedingly low BP. EMS was called, and per spouse, patient actually passed out, when he was placed in a wheelchair, to be placed in the ambulance. Per spouse, his CBG was about 126 at that time. Patient has had no antecedent SOB, chest pain, cough, fever, chills, vomiting or diarrhea. Per spouse, patient has been gradually declining over several weeks to months, and has fallen at least 3 times in the past one week. In the ED, SBP was found to be in the 60s, but normalized after an iv bolus of 250 ml NS.      Hospital Course:  1. Hypotension: -Initially suspected to be from volume shifts with HD, chronic hypotension  -But was febrile 5/8 raising concern for possible HD  catheter infection,  -started IV Vancomycin 5/9, Fu blood cultures, negative so far from 5/8 for 4 days -continue Midodrine, dose increased to 10mg  TID  -intermittently received fluid during hospitalization for hypotension. -Discussed with Renal and decision made to treat with IV Vanc at HD for 2 weeks -BP improved in last 48hours of admission  2. Generalized weakness /Falls:  -. Head CT scan is negative for acute findings, and patient has no focal neurologic deficit on physical examination. -Likely, profound hypotension was the culprit, although his gradual decline may be due to ESRD. Patient per spouse, has had recurrent falls, which may be attributable to orthostasis and debility.  -Seen by PT/OT in consultation -Recommended HH services, which was set up prior to discharge  3. Abd pain: possibly related to hypotension 5/8  -resolved now, diet as tolerated, CT negative  -continue PPI   4. DM: stable  -SSI   5. CAD: stable, cardiac enzymes negative   6. ESRD:  - see #1 -midodrine TID   7. Anemia of chronic disease:  - aranesp per renal with HD     Consultations:  Renal  Discharge Exam: Filed Vitals:   05/10/12 1735 05/10/12 2034 05/11/12 0436 05/11/12 0926  BP: 111/69 133/76 119/77 164/69  Pulse: 90 83 92 82  Temp: 98.4 F (36.9 C) 98.1 F (36.7 C) 98.1 F (36.7 C) 98.5 F (36.9 C)  TempSrc: Oral Oral Oral Oral  Resp:  18 18 18 18   Height:      Weight:      SpO2: 95% 100% 98% 98%    General: AAOx3, no distress Cardiovascular: S1S2/RRR Respiratory: CTAB  Discharge Instructions  Discharge Orders   Future Orders Complete By Expires     Discharge instructions  As directed     Comments:      Renal Diabetic    Increase activity slowly  As directed         Medication List    STOP taking these medications       ciprofloxacin 500 MG tablet  Commonly known as:  CIPRO     metroNIDAZOLE 500 MG tablet  Commonly known as:  FLAGYL      TAKE these  medications       artificial tears Oint ophthalmic ointment  Place 1 application into the right eye 2 (two) times daily.     aspirin 325 MG tablet  Take 325 mg by mouth daily.     brimonidine 0.2 % ophthalmic solution  Commonly known as:  ALPHAGAN  Place 1 drop into both eyes 2 (two) times daily.     calcium acetate 667 MG capsule  Commonly known as:  PHOSLO  Take 4 capsules (2,668 mg total) by mouth 3 (three) times daily with meals.     cinacalcet 30 MG tablet  Commonly known as:  SENSIPAR  Take 30 mg by mouth daily.     cyanocobalamin 500 MCG tablet  Take 1,000 mcg by mouth daily.     folic acid-vitamin b complex-vitamin c-selenium-zinc 3 MG Tabs  Take 1 tablet by mouth daily.     gabapentin 100 MG capsule  Commonly known as:  NEURONTIN  Take 300 mg by mouth at bedtime as needed (nerve pain).     insulin aspart 100 UNIT/ML injection  Commonly known as:  novoLOG  Inject 6 Units into the skin 3 (three) times daily before meals.     insulin glargine 100 UNIT/ML injection  Commonly known as:  LANTUS  Inject 0.2 mLs (20 Units total) into the skin at bedtime.     midodrine 10 MG tablet  Commonly known as:  PROAMATINE  Take 1 tablet (10 mg total) by mouth 3 (three) times daily.     omeprazole 20 MG capsule  Commonly known as:  PRILOSEC  Take 20 mg by mouth 2 (two) times daily.     oxycodone 5 MG capsule  Commonly known as:  OXY-IR  Take 10 mg by mouth every 6 (six) hours as needed for pain.     pravastatin 40 MG tablet  Commonly known as:  PRAVACHOL  Take 40 mg by mouth at bedtime.     REFRESH CELLUVISC 1 % ophthalmic solution  Generic drug:  carboxymethylcellulose  Place 1 drop into both eyes every 2 (two) hours.     rOPINIRole 0.25 MG tablet  Commonly known as:  REQUIP  Take 0.25-0.5 mg by mouth 3 (three) times daily. 1 tablet twice a day and 2 tablets at bedtime.     sennosides-docusate sodium 8.6-50 MG tablet  Commonly known as:  SENOKOT-S  Take 1 tablet  by mouth 2 (two) times daily as needed for constipation. For constipation     vancomycin 1 GM/200ML Soln  Commonly known as:  VANCOCIN  Inject 200 mLs (1,000 mg total) into the vein Every Tuesday,Thursday,and Saturday with dialysis. For 2 weeks       Allergies  Allergen Reactions  . Cefepime  Itching  . Penicillins Hives and Swelling       Follow-up Information   Follow up with [facility]. [facility] RN, Physical Therapy, aide)    Contact information:   [phone]       The results of significant diagnostics from this hospitalization (including imaging, microbiology, ancillary and laboratory) are listed below for reference.    Significant Diagnostic Studies: Ct Head Wo Contrast  05/07/2012   *RADIOLOGY REPORT*  Clinical Data: Altered mental status  CT HEAD WITHOUT CONTRAST  Technique:  Contiguous axial images were obtained from the base of the skull through the vertex without contrast.  Comparison: 12/11/2011  Findings: No skull fracture is noted.  There is mild mucosal thickening right sphenoid sinus.  The mastoid air cells are unremarkable.  No intracranial hemorrhage, mass effect or midline shift.  Stable cerebral atrophy.  Stable periventricular and patchy subcortical chronic white matter disease.  No acute infarction.  No mass lesion is noted on this unenhanced scan.  IMPRESSION: No acute intracranial abnormality.  Stable atrophy and chronic white matter disease.   Original Report Authenticated By: Natasha Mead, M.D.   Ct Abdomen Pelvis W Contrast  05/07/2012   *RADIOLOGY REPORT*  Clinical Data: Old male abdominal pain.  Hypertension.  CT ABDOMEN AND PELVIS WITH CONTRAST  Technique:  Multidetector CT imaging of the abdomen and pelvis was performed following the standard protocol during bolus administration of intravenous contrast.  Contrast: OMNIPAQUE IOHEXOL 300 MG/ML  SOLN  Comparison: 04/30/2012 and earlier.  Findings: Stable lung bases.  Small lung nodules in the  right costophrenic angle again noted, these are only minimally increased in 2011 most compatible with benign etiology.  No pericardial or pleural effusion. Stable small posterior mediastinal lymph nodes lateral to the thoracic  esophagus.  Chronic left posterior lateral rib fractures.  Chronic mild to moderate lumbar compression fractures. Stable visualized osseous structures.  Postoperative changes from left hip arthroplasty with streak artifact.  Stable small fat containing right inguinal hernia.  No pelvic free fluid is evident.  The bladder is decompressed.  Unchanged mild wall thickening in the distal rectum which is decompressed as before.  The sigmoid is redundant extending up into the right abdomen but otherwise within normal limits.  Negative left colon and transverse colon.  The ascending colon today is less decompressed.  A 2-3 cm segment which is nondistended (coronal image 72) is felt to be physiologic. Oral contrast has reached the transverse colon.  No pericecal inflammation.  The appendix is diminutive or absent.  Terminal ileum within normal limits.  No dilated small bowel.  Stomach and duodenum within normal limits.  Liver, gallbladder, spleen, pancreas and adrenal glands are stable and within normal limits.  Unchanged renal atrophy.  Insufficient contrast in the portal venous system today.  Major arterial structures in the abdomen pelvis are stable and appear patent despite widespread atherosclerosis.  No abdominal free fluid or free air.  No focal intra-abdominal or intrapelvic inflammatory stranding.  There is a small area of stranding in the right ventral abdomen subcutaneous soft tissues (series 2 image 7) with probably relate to recent subcutaneous injection.  IMPRESSION: Stable CT appearance of the abdomen and pelvis with no focal inflammatory process identified.   Original Report Authenticated By: Erskine Speed, M.D.   Ct Abdomen Pelvis W Contrast  04/30/2012   *RADIOLOGY REPORT*  Clinical  Data: Dizziness.  Short of breath.  Dialysis today.  CT ABDOMEN AND PELVIS WITH CONTRAST  Technique:  Multidetector CT imaging of the abdomen and pelvis was performed following the standard protocol during bolus administration of intravenous contrast.  Contrast: OMNIPAQUE IOHEXOL 300 MG/ML  SOLN  Comparison: 06/21/2010 CT.  Findings:  Lung Bases: Dependent atelectasis at the left lung base.  valvular and coronary artery calcification in the heart.  Right gynecomastia incidentally noted.  Old left rib fractures.  Liver:  Normal.  Spleen:  Normal.  Gallbladder:  Partially contracted.  Normal.  Common bile duct:  No calcified stones.  Pancreas:  Mild atrophy.  No mass lesion.  No inflammatory changes.  Adrenal glands:  Normal.  Kidneys:  Bilateral renal atrophy.  Both ureters appear within normal limits.  Stomach:  Mild mural thickening is nonspecific.  This may be due to under distention or associated with chronic gastritis.  Clinically correlate.  Small bowel:  The duodenum appears normal.  Abdominal small bowel and pelvic small bowel appears normal.  No obstruction.  Colon:   Normal appendix.  No inflammatory changes of colon.  The majority of the colon is decompressed with mural thickening associated with decompression.  Pelvic Genitourinary:  Artifact from left hip arthroplasty obscures part of the pelvis.  Urinary bladder shows a tiny amount of fluid and mural thickening which is likely secondary to under distention. The fat containing right inguinal hernia.  Bones:  No aggressive osseous lesions.  Old lumbar compression fractures.  No acute abnormality.  Vasculature: Atherosclerosis.  No acute vascular abnormality.  IMPRESSION: No acute abdominal abnormality.  The colon is decompressed.  Mild wall thickening is favored to be due to nondistension of the colon however mild colitis could be present in this patient with diarrhea.  Atrophic native kidneys.   Original Report Authenticated By: Andreas Newport,  M.D.   Dg Chest Port 1 View  05/07/2012   *RADIOLOGY REPORT*  Clinical Data: Altered mental status, dialysis patient, history of COPD and CHF  PORTABLE CHEST - 1 VIEW  Comparison: 02/14/2012; 01/01/2012  Findings:  Grossly unchanged enlarged cardiac silhouette and mediastinal contours with apparent differences attributable to decreased lung volumes and AP projection. Stable positioning of support apparatus. The pulmonary vasculature is less distinct on the present examination with cephalization of flow.  No focal airspace opacities.  No definite pleural effusion or pneumothorax. Unchanged bones.  IMPRESSION: Suspected mild pulmonary edema on this hypoventilated AP supine examination.   Original Report Authenticated By: Tacey Ruiz, MD    Microbiology: No results found for this or any previous visit (from the past 240 hour(s)).   Labs: Basic Metabolic Panel: No results found for this basename: NA, K, CL, CO2, GLUCOSE, BUN, CREATININE, CALCIUM, MG, PHOS,  in the last 168 hours Liver Function Tests: No results found for this basename: AST, ALT, ALKPHOS, BILITOT, PROT, ALBUMIN,  in the last 168 hours No results found for this basename: LIPASE, AMYLASE,  in the last 168 hours No results found for this basename: AMMONIA,  in the last 168 hours CBC: No results found for this basename: WBC, NEUTROABS, HGB, HCT, MCV, PLT,  in the last 168 hours Cardiac Enzymes: No results found for this basename: CKTOTAL, CKMB, CKMBINDEX, TROPONINI,  in the last 168 hours BNP: BNP (last 3 results) No results found for this basename: PROBNP,  in the last 8760 hours CBG: No results found for this basename: GLUCAP,  in the last 168 hours     Signed:  Jaymeson Mengel  [facility] 05/26/2012, 6:08 PM

## 2012-08-29 ENCOUNTER — Inpatient Hospital Stay (HOSPITAL_COMMUNITY)
Admission: EM | Admit: 2012-08-29 | Discharge: 2012-09-01 | DRG: 377 | Disposition: A | Discharge status: Home / Self Care | Payer: Medicare Other | Attending: Internal Medicine | Admitting: Internal Medicine

## 2012-08-29 ENCOUNTER — Encounter (HOSPITAL_COMMUNITY): Payer: Self-pay | Admitting: Adult Health

## 2012-08-29 DIAGNOSIS — K922 Gastrointestinal hemorrhage, unspecified: Secondary | ICD-10-CM | POA: Diagnosis present

## 2012-08-29 DIAGNOSIS — J189 Pneumonia, unspecified organism: Secondary | ICD-10-CM

## 2012-08-29 DIAGNOSIS — K921 Melena: Principal | ICD-10-CM | POA: Diagnosis present

## 2012-08-29 DIAGNOSIS — R531 Weakness: Secondary | ICD-10-CM

## 2012-08-29 DIAGNOSIS — Z794 Long term (current) use of insulin: Secondary | ICD-10-CM

## 2012-08-29 DIAGNOSIS — Z96649 Presence of unspecified artificial hip joint: Secondary | ICD-10-CM

## 2012-08-29 DIAGNOSIS — Z7982 Long term (current) use of aspirin: Secondary | ICD-10-CM

## 2012-08-29 DIAGNOSIS — Z79899 Other long term (current) drug therapy: Secondary | ICD-10-CM

## 2012-08-29 DIAGNOSIS — H547 Unspecified visual loss: Secondary | ICD-10-CM

## 2012-08-29 DIAGNOSIS — I429 Cardiomyopathy, unspecified: Secondary | ICD-10-CM

## 2012-08-29 DIAGNOSIS — I509 Heart failure, unspecified: Secondary | ICD-10-CM | POA: Diagnosis present

## 2012-08-29 DIAGNOSIS — I959 Hypotension, unspecified: Secondary | ICD-10-CM

## 2012-08-29 DIAGNOSIS — Z9981 Dependence on supplemental oxygen: Secondary | ICD-10-CM

## 2012-08-29 DIAGNOSIS — E785 Hyperlipidemia, unspecified: Secondary | ICD-10-CM | POA: Diagnosis present

## 2012-08-29 DIAGNOSIS — Y841 Kidney dialysis as the cause of abnormal reaction of the patient, or of later complication, without mention of misadventure at the time of the procedure: Secondary | ICD-10-CM | POA: Diagnosis present

## 2012-08-29 DIAGNOSIS — I252 Old myocardial infarction: Secondary | ICD-10-CM

## 2012-08-29 DIAGNOSIS — N186 End stage renal disease: Secondary | ICD-10-CM

## 2012-08-29 DIAGNOSIS — I440 Atrioventricular block, first degree: Secondary | ICD-10-CM | POA: Diagnosis present

## 2012-08-29 DIAGNOSIS — E119 Type 2 diabetes mellitus without complications: Secondary | ICD-10-CM

## 2012-08-29 DIAGNOSIS — Z992 Dependence on renal dialysis: Secondary | ICD-10-CM

## 2012-08-29 DIAGNOSIS — T827XXA Infection and inflammatory reaction due to other cardiac and vascular devices, implants and grafts, initial encounter: Secondary | ICD-10-CM | POA: Diagnosis present

## 2012-08-29 DIAGNOSIS — R55 Syncope and collapse: Secondary | ICD-10-CM

## 2012-08-29 DIAGNOSIS — I120 Hypertensive chronic kidney disease with stage 5 chronic kidney disease or end stage renal disease: Secondary | ICD-10-CM | POA: Diagnosis present

## 2012-08-29 DIAGNOSIS — D649 Anemia, unspecified: Secondary | ICD-10-CM

## 2012-08-29 DIAGNOSIS — Z8673 Personal history of transient ischemic attack (TIA), and cerebral infarction without residual deficits: Secondary | ICD-10-CM

## 2012-08-29 DIAGNOSIS — I2510 Atherosclerotic heart disease of native coronary artery without angina pectoris: Secondary | ICD-10-CM

## 2012-08-29 DIAGNOSIS — K219 Gastro-esophageal reflux disease without esophagitis: Secondary | ICD-10-CM | POA: Diagnosis present

## 2012-08-29 DIAGNOSIS — G4733 Obstructive sleep apnea (adult) (pediatric): Secondary | ICD-10-CM | POA: Diagnosis present

## 2012-08-29 DIAGNOSIS — H543 Unqualified visual loss, both eyes: Secondary | ICD-10-CM | POA: Diagnosis present

## 2012-08-29 DIAGNOSIS — Z6834 Body mass index (BMI) 34.0-34.9, adult: Secondary | ICD-10-CM

## 2012-08-29 DIAGNOSIS — N2581 Secondary hyperparathyroidism of renal origin: Secondary | ICD-10-CM | POA: Diagnosis present

## 2012-08-29 LAB — CBC
HCT: 33.5 % — ABNORMAL LOW (ref 39.0–52.0)
Hemoglobin: 10.6 g/dL — ABNORMAL LOW (ref 13.0–17.0)
MCV: 97.7 fL (ref 78.0–100.0)
RBC: 3.43 MIL/uL — ABNORMAL LOW (ref 4.22–5.81)
RDW: 13.3 % (ref 11.5–15.5)
WBC: 15.9 10*3/uL — ABNORMAL HIGH (ref 4.0–10.5)

## 2012-08-29 LAB — POCT I-STAT, CHEM 8
BUN: 32 mg/dL — ABNORMAL HIGH (ref 6–23)
Chloride: 98 mEq/L (ref 96–112)
HCT: 33 % — ABNORMAL LOW (ref 39.0–52.0)
Potassium: 3.7 mEq/L (ref 3.5–5.1)
Sodium: 139 mEq/L (ref 135–145)

## 2012-08-29 NOTE — ED Notes (Signed)
Patient presents with c/o multiple episodes of bloody diarrhea since this morning. Initially stools were dark red and has gotten more bright red throughout the afternoon.   Dialysis patient T-Th-Sa; went to dialysis today for an "extra treatment" per nephrologist. Hemo cath to left subclavian changed out Monday d/t infection. Has been receiving antibiotics through dialysis cath with each treatment since Monday.   AAOx4, resp e/u, Afib on monitor HR 80s, SPO2 100% on 3L [place] (home dose); no fevers, sweats or chills; c/o fatigue.

## 2012-08-29 NOTE — ED Provider Notes (Signed)
CSN: [id number]     Arrival date & time 08/29/12  2300 History   First MD Initiated Contact with Patient 08/29/12 2344     Chief Complaint  Patient presents with  . GI Bleeding   (Consider location/radiation/quality/duration/timing/severity/associated sxs/prior Treatment) Patient is a 67 y.o. male presenting with hematochezia. The history is provided by the patient.  Rectal Bleeding Quality:  Black and tarry and bright red Amount:  Moderate Duration:  1 day Timing:  Constant Progression:  Unchanged Chronicity:  New Context: diarrhea   Relieved by:  Nothing Worsened by:  Nothing tried Ineffective treatments:  None tried Associated symptoms: no abdominal pain and no fever   Risk factors: NSAID use     Past Medical History  Diagnosis Date  . CHF (congestive heart failure)   . CAD (coronary artery disease) 2006  . COPD (chronic obstructive pulmonary disease)   . Hyperlipidemia   . ESRD (end stage renal disease) on dialysis started 08/2006    MTTS [place]; "getting ready to have it on TTS" (05/07/2012)  . Type II diabetes mellitus     etiology of ESRD  . Diabetic blindness   . Hypertension   . MI, old 2006  . Asthma   . Pneumonia     "everytime I take a cold" (05/07/2012)  . Bronchiectasis   . Shortness of breath     "can happen at anytime" (05/07/2012)  . GERD (gastroesophageal reflux disease)   . Stroke 2006    "numb on left side since"  05/07/2012  . On home oxygen therapy     "3L at night mostly; also prn" (05/07/2012)   Past Surgical History  Procedure Laterality Date  . Av fistula placement Right 09/2006    "used it once" (05/07/2012)  . Appendectomy  ~ 1958  . Insertion of dialysis catheter Left 09/2006    "chest" (05/07/2012)  . Total hip arthroplasty Left 1997  . Repair ankle ligament Left 1981  . Cataract extraction w/ intraocular lens implant Left ?2012   Family History  Problem Relation Age of Onset  . Diabetes Mellitus II Father    History  Substance Use  Topics  . Smoking status: Former Smoker -- 2.00 packs/day for 30 years    Types: Cigarettes    Quit date: 01/02/1984  . Smokeless tobacco: Never Used  . Alcohol Use: Yes     Comment: 05/07/2012 "stopped drinking 1979"    Review of Systems  Constitutional: Negative for fever.  Gastrointestinal: Positive for hematochezia. Negative for abdominal pain.  All other systems reviewed and are negative.    Allergies  Cefepime and Penicillins  Home Medications   Current Outpatient Rx  Name  Route  Sig  Dispense  Refill  . artificial tears (LACRILUBE) OINT ophthalmic ointment   Right Eye   Place 1 application into the right eye 2 (two) times daily.         Marland Kitchen aspirin 325 MG tablet   Oral   Take 325 mg by mouth daily.         . brimonidine (ALPHAGAN) 0.2 % ophthalmic solution   Both Eyes   Place 1 drop into both eyes 2 (two) times daily.         . calcium acetate (PHOSLO) 667 MG capsule   Oral   Take 4 capsules (2,668 mg total) by mouth 3 (three) times daily with meals.   360 capsule   0   . carboxymethylcellulose (REFRESH CELLUVISC) 1 % ophthalmic solution  Both Eyes   Place 1 drop into both eyes every 2 (two) hours.         . cinacalcet (SENSIPAR) 30 MG tablet   Oral   Take 30 mg by mouth daily.         . cyanocobalamin 500 MCG tablet   Oral   Take 1,000 mcg by mouth daily.         . folic acid-vitamin b complex-vitamin c-selenium-zinc (DIALYVITE) 3 MG TABS   Oral   Take 1 tablet by mouth daily.         Marland Kitchen gabapentin (NEURONTIN) 100 MG capsule   Oral   Take 300 mg by mouth at bedtime as needed (nerve pain).         . insulin aspart (NOVOLOG) 100 UNIT/ML injection   Subcutaneous   Inject 6 Units into the skin 3 (three) times daily before meals.   1 vial   0   . insulin glargine (LANTUS) 100 UNIT/ML injection   Subcutaneous   Inject 0.2 mLs (20 Units total) into the skin at bedtime.   10 mL      . midodrine (PROAMATINE) 10 MG tablet   Oral    Take 1 tablet (10 mg total) by mouth 3 (three) times daily.   30 tablet   0   . omeprazole (PRILOSEC) 20 MG capsule   Oral   Take 20 mg by mouth 2 (two) times daily.          Marland Kitchen oxycodone (OXY-IR) 5 MG capsule   Oral   Take 10 mg by mouth every 6 (six) hours as needed for pain.          . pravastatin (PRAVACHOL) 40 MG tablet   Oral   Take 40 mg by mouth at bedtime.          Marland Kitchen rOPINIRole (REQUIP) 0.25 MG tablet   Oral   Take 0.25-0.5 mg by mouth 3 (three) times daily. 1 tablet twice a day and 2 tablets at bedtime.         . sennosides-docusate sodium (SENOKOT-S) 8.6-50 MG tablet   Oral   Take 1 tablet by mouth 2 (two) times daily as needed for constipation. For constipation         . vancomycin (VANCOCIN) 1 GM/200ML SOLN   Intravenous   Inject 200 mLs (1,000 mg total) into the vein Every Tuesday,Thursday,and Saturday with dialysis. For 2 weeks   4000 mL       BP 127/60  Pulse 64  Temp(Src) 98.1 F (36.7 C) (Oral)  Resp 18  SpO2 98% Physical Exam  Constitutional: He is oriented to person, place, and time. He appears well-developed and well-nourished. No distress.  HENT:  Head: Normocephalic and atraumatic.  Mouth/Throat: Oropharynx is clear and moist.  Eyes: Conjunctivae are normal. Pupils are equal, round, and reactive to light.  Neck: Normal range of motion. Neck supple.  Cardiovascular: Normal rate, regular rhythm and intact distal pulses.   Pulmonary/Chest: Effort normal and breath sounds normal. He has no wheezes. He has no rales.  Abdominal: Soft. Bowel sounds are normal. There is no tenderness. There is no guarding.  Genitourinary: Guaiac positive stool.  Musculoskeletal: Normal range of motion.  Neurological: He is alert and oriented to person, place, and time.  Skin: Skin is warm and dry.  Psychiatric: He has a normal mood and affect.    ED Course  Procedures (including critical care time) Labs Review Labs Reviewed  POCT I-STAT, CHEM  8 -  Abnormal; Notable for the following:    BUN 32 (*)    Creatinine, Ser 7.40 (*)    Glucose, Bld 130 (*)    Hemoglobin 11.2 (*)    HCT 33.0 (*)    All other components within normal limits  CBC  TYPE AND SCREEN   Imaging Review No results found.  MDM      Jennifer Holland K Gita Dilger-Rasch, MD 08/30/12 [phone]

## 2012-08-29 NOTE — ED Notes (Signed)
MD at bedside. 

## 2012-08-29 NOTE — ED Notes (Addendum)
Presents with 15-20 times today dark black and bloody diarrhea that began this AM associated with abdominal pain. Dialysis pt with port in upper left chest, unable to finish today due to diarrhea, pale mucous membranes, slightly jaundiced tone. Fatigued. No blood thinners.  Last bout of bloody diarrhea at 22:45

## 2012-08-30 ENCOUNTER — Encounter (HOSPITAL_COMMUNITY): Payer: Self-pay | Admitting: *Deleted

## 2012-08-30 ENCOUNTER — Emergency Department (HOSPITAL_COMMUNITY): Payer: Medicare Other

## 2012-08-30 DIAGNOSIS — D649 Anemia, unspecified: Secondary | ICD-10-CM

## 2012-08-30 DIAGNOSIS — E119 Type 2 diabetes mellitus without complications: Secondary | ICD-10-CM

## 2012-08-30 DIAGNOSIS — N186 End stage renal disease: Secondary | ICD-10-CM

## 2012-08-30 DIAGNOSIS — K922 Gastrointestinal hemorrhage, unspecified: Secondary | ICD-10-CM

## 2012-08-30 DIAGNOSIS — I2510 Atherosclerotic heart disease of native coronary artery without angina pectoris: Secondary | ICD-10-CM

## 2012-08-30 LAB — RENAL FUNCTION PANEL
CO2: 29 mEq/L (ref 19–32)
Calcium: 9.4 mg/dL (ref 8.4–10.5)
GFR calc Af Amer: 7 mL/min — ABNORMAL LOW (ref >90)
GFR calc non Af Amer: 6 mL/min — ABNORMAL LOW (ref >90)
Sodium: 137 mEq/L (ref 135–145)

## 2012-08-30 LAB — BASIC METABOLIC PANEL
BUN: 33 mg/dL — ABNORMAL HIGH (ref 6–23)
Calcium: 9.4 mg/dL (ref 8.4–10.5)
Creatinine, Ser: 7.49 mg/dL — ABNORMAL HIGH (ref 0.50–1.35)
GFR calc Af Amer: 8 mL/min — ABNORMAL LOW (ref >90)
GFR calc non Af Amer: 7 mL/min — ABNORMAL LOW (ref >90)
Potassium: 3.6 mEq/L (ref 3.5–5.1)

## 2012-08-30 LAB — GLUCOSE, CAPILLARY: Glucose-Capillary: 120 mg/dL — ABNORMAL HIGH (ref 70–99)

## 2012-08-30 LAB — CBC
MCH: 31.1 pg (ref 26.0–34.0)
MCHC: 32.5 g/dL (ref 30.0–36.0)
Platelets: 205 10*3/uL (ref 150–400)
Platelets: 223 10*3/uL (ref 150–400)
RBC: 3.02 MIL/uL — ABNORMAL LOW (ref 4.22–5.81)
RDW: 13.3 % (ref 11.5–15.5)
WBC: 13 10*3/uL — ABNORMAL HIGH (ref 4.0–10.5)

## 2012-08-30 LAB — MRSA PCR SCREENING: MRSA by PCR: NEGATIVE

## 2012-08-30 LAB — TYPE AND SCREEN: Antibody Screen: NEGATIVE

## 2012-08-30 LAB — OCCULT BLOOD, POC DEVICE: Fecal Occult Bld: POSITIVE — AB

## 2012-08-30 LAB — HEMOGLOBIN A1C
Hgb A1c MFr Bld: 7 % — ABNORMAL HIGH (ref <5.7)
Mean Plasma Glucose: 154 mg/dL — ABNORMAL HIGH (ref <117)

## 2012-08-30 MED ORDER — VITAMIN B-12 1000 MCG PO TABS
1000.0000 ug | ORAL_TABLET | Freq: Every day | ORAL | Status: DC
  Administered 2012-08-30 – 2012-09-01 (×3): 1000 ug via ORAL
  Filled 2012-08-30 (×3): qty 1

## 2012-08-30 MED ORDER — GABAPENTIN 300 MG PO CAPS
300.0000 mg | ORAL_CAPSULE | Freq: Two times a day (BID) | ORAL | Status: DC | PRN
  Administered 2012-08-30: 300 mg via ORAL
  Filled 2012-08-30 (×2): qty 1

## 2012-08-30 MED ORDER — ONDANSETRON HCL 4 MG PO TABS: 4.0000 mg | ORAL_TABLET | Freq: Four times a day (QID) | ORAL | Status: DC | PRN

## 2012-08-30 MED ORDER — ONDANSETRON HCL 4 MG/2ML IJ SOLN
4.0000 mg | Freq: Four times a day (QID) | INTRAMUSCULAR | Status: DC | PRN
  Administered 2012-08-31: 4 mg via INTRAVENOUS
  Filled 2012-08-30: qty 2

## 2012-08-30 MED ORDER — SODIUM CHLORIDE 0.9 % IV SOLN: INTRAVENOUS | Status: DC

## 2012-08-30 MED ORDER — NEPHRO-VITE 0.8 MG PO TABS
1.0000 | ORAL_TABLET | Freq: Every day | ORAL | Status: DC
  Administered 2012-08-30: 09:00:00 via ORAL
  Administered 2012-08-31: 1 via ORAL
  Filled 2012-08-30 (×5): qty 1

## 2012-08-30 MED ORDER — ACETAMINOPHEN 650 MG RE SUPP: 650.0000 mg | Freq: Four times a day (QID) | RECTAL | Status: DC | PRN

## 2012-08-30 MED ORDER — LIDOCAINE-PRILOCAINE 2.5-2.5 % EX CREA: 1.0000 "application " | TOPICAL_CREAM | CUTANEOUS | Status: DC | PRN

## 2012-08-30 MED ORDER — HEPARIN SODIUM (PORCINE) 1000 UNIT/ML DIALYSIS: 5000.0000 [IU] | INTRAMUSCULAR | Status: DC | PRN

## 2012-08-30 MED ORDER — POLYVINYL ALCOHOL 1.4 % OP SOLN
1.0000 [drp] | Freq: Two times a day (BID) | OPHTHALMIC | Status: DC
  Administered 2012-08-30 – 2012-09-01 (×4): 1 [drp] via OPHTHALMIC
  Filled 2012-08-30: qty 15

## 2012-08-30 MED ORDER — GABAPENTIN 300 MG PO CAPS
300.0000 mg | ORAL_CAPSULE | Freq: Every evening | ORAL | Status: DC | PRN
  Administered 2012-08-30: 300 mg via ORAL
  Filled 2012-08-30: qty 1

## 2012-08-30 MED ORDER — LIDOCAINE HCL (PF) 1 % IJ SOLN: 5.0000 mL | INTRAMUSCULAR | Status: DC | PRN

## 2012-08-30 MED ORDER — ALTEPLASE 2 MG IJ SOLR: 2.0000 mg | Freq: Once | INTRAMUSCULAR | Status: DC | PRN

## 2012-08-30 MED ORDER — CALCIUM ACETATE 667 MG PO CAPS
2866.0000 mg | ORAL_CAPSULE | Freq: Three times a day (TID) | ORAL | Status: DC
  Administered 2012-08-30 – 2012-08-31 (×4): 2668 mg via ORAL
  Filled 2012-08-30 (×7): qty 4

## 2012-08-30 MED ORDER — SODIUM CHLORIDE 0.9 % IV SOLN: 100.0000 mL | INTRAVENOUS | Status: DC | PRN

## 2012-08-30 MED ORDER — INSULIN GLARGINE 100 UNIT/ML ~~LOC~~ SOLN
35.0000 [IU] | Freq: Two times a day (BID) | SUBCUTANEOUS | Status: DC
Start: 2012-08-30 — End: 2012-08-31
  Administered 2012-08-30 (×2): 35 [IU] via SUBCUTANEOUS
  Filled 2012-08-30 (×5): qty 0.35

## 2012-08-30 MED ORDER — PANTOPRAZOLE SODIUM 40 MG IV SOLR
40.0000 mg | INTRAVENOUS | Status: DC
  Administered 2012-08-30 – 2012-08-31 (×2): 40 mg via INTRAVENOUS
  Filled 2012-08-30 (×3): qty 40

## 2012-08-30 MED ORDER — BRIMONIDINE TARTRATE 0.2 % OP SOLN
1.0000 [drp] | Freq: Two times a day (BID) | OPHTHALMIC | Status: DC
  Administered 2012-08-30 – 2012-09-01 (×6): 1 [drp] via OPHTHALMIC
  Filled 2012-08-30 (×2): qty 5

## 2012-08-30 MED ORDER — ARTIFICIAL TEARS OP OINT
1.0000 "application " | TOPICAL_OINTMENT | Freq: Two times a day (BID) | OPHTHALMIC | Status: DC
  Administered 2012-08-30 – 2012-09-01 (×6): 1 via OPHTHALMIC
  Filled 2012-08-30 (×2): qty 3.5

## 2012-08-30 MED ORDER — HEPARIN SODIUM (PORCINE) 1000 UNIT/ML DIALYSIS: 1000.0000 [IU] | INTRAMUSCULAR | Status: DC | PRN

## 2012-08-30 MED ORDER — POLYVINYL ALCOHOL 1.4 % OP SOLN
1.0000 [drp] | OPHTHALMIC | Status: DC
  Administered 2012-08-30 (×4): 1 [drp] via OPHTHALMIC
  Filled 2012-08-30: qty 15

## 2012-08-30 MED ORDER — INSULIN ASPART 100 UNIT/ML ~~LOC~~ SOLN
0.0000 [IU] | SUBCUTANEOUS | Status: DC
  Administered 2012-08-30: 1 [IU] via SUBCUTANEOUS
  Administered 2012-08-31 (×2): 2 [IU] via SUBCUTANEOUS
  Administered 2012-08-31: 1 [IU] via SUBCUTANEOUS

## 2012-08-30 MED ORDER — ROPINIROLE HCL 0.25 MG PO TABS
0.2500 mg | ORAL_TABLET | ORAL | Status: DC
  Administered 2012-08-30 – 2012-09-01 (×5): 0.25 mg via ORAL
  Filled 2012-08-30 (×7): qty 1

## 2012-08-30 MED ORDER — ROPINIROLE HCL 0.5 MG PO TABS
0.5000 mg | ORAL_TABLET | Freq: Every day | ORAL | Status: DC
  Administered 2012-08-30 – 2012-08-31 (×3): 0.5 mg via ORAL
  Filled 2012-08-30 (×4): qty 1

## 2012-08-30 MED ORDER — HYDROMORPHONE HCL PF 1 MG/ML IJ SOLN
0.5000 mg | INTRAMUSCULAR | Status: DC | PRN
Start: 2012-08-30 — End: 2012-09-01

## 2012-08-30 MED ORDER — SODIUM CHLORIDE 0.9 % IV BOLUS (SEPSIS)
500.0000 mL | Freq: Once | INTRAVENOUS | Status: AC
  Administered 2012-08-30: 500 mL via INTRAVENOUS

## 2012-08-30 MED ORDER — SIMVASTATIN 20 MG PO TABS
20.0000 mg | ORAL_TABLET | Freq: Every day | ORAL | Status: DC
  Administered 2012-08-30 – 2012-08-31 (×2): 20 mg via ORAL
  Filled 2012-08-30 (×3): qty 1

## 2012-08-30 MED ORDER — NEPRO/CARBSTEADY PO LIQD: 237.0000 mL | ORAL | Status: DC | PRN

## 2012-08-30 MED ORDER — PENTAFLUOROPROP-TETRAFLUOROETH EX AERO: 1.0000 "application " | INHALATION_SPRAY | CUTANEOUS | Status: DC | PRN

## 2012-08-30 MED ORDER — CINACALCET HCL 30 MG PO TABS
30.0000 mg | ORAL_TABLET | Freq: Every day | ORAL | Status: DC
  Administered 2012-08-30 – 2012-09-01 (×3): 30 mg via ORAL
  Filled 2012-08-30 (×4): qty 1

## 2012-08-30 MED ORDER — ACETAMINOPHEN 325 MG PO TABS: 650.0000 mg | ORAL_TABLET | Freq: Four times a day (QID) | ORAL | Status: DC | PRN

## 2012-08-30 MED ORDER — ZOLPIDEM TARTRATE 5 MG PO TABS: 5.0000 mg | ORAL_TABLET | Freq: Every evening | ORAL | Status: DC | PRN

## 2012-08-30 MED ORDER — MIDODRINE HCL 5 MG PO TABS
10.0000 mg | ORAL_TABLET | Freq: Three times a day (TID) | ORAL | Status: DC
  Administered 2012-08-30 – 2012-09-01 (×8): 10 mg via ORAL
  Filled 2012-08-30 (×10): qty 2

## 2012-08-30 MED ORDER — HEPARIN SODIUM (PORCINE) 1000 UNIT/ML IJ SOLN
10000.0000 [IU] | Freq: Once | INTRAMUSCULAR | Status: AC
  Administered 2012-08-30: 10000 [IU] via INTRAVENOUS

## 2012-08-30 MED ORDER — OXYCODONE HCL 5 MG PO TABS
5.0000 mg | ORAL_TABLET | ORAL | Status: DC | PRN
  Administered 2012-08-30 – 2012-09-01 (×7): 5 mg via ORAL
  Filled 2012-08-30 (×7): qty 1

## 2012-08-30 NOTE — Progress Notes (Addendum)
[facility] PROGRESS NOTE  [name] ZOX:[id number] DOB: 1945-04-26 DOA: 08/29/2012 PCP: Todd Ligas., MD  Assessment/Plan:  67 y.o. male with history of ESRD on HD, DM@, and CD who presents to the ED with complaints of passing bloody stools all day. He denies having ABD pain or nausea or vomiting or fevers or chills. He had an extra dialysis treatment today but was unable To complete the session. He had his dialysis catheter changed due to infection and has been receiving IV Vancomycin with dialysis and had to have an additional session on Friday due to volume overload. His ususal dialysis schedule is on Tues Thursday and Saturdays.   1. GIB, +occult blood; hemodynamically stable; Hg 11.0;  -consult GI, possible need endoscopy; no previous history of endoscopy per patient  -Monitor H/H, IV Protonix, Aspirin on hold  2. ESRD on HD- discussed with nephrologist for HD, +vanc .  3. Anemia due to #1 and #2, monitor H/Hs.  4. DM2- HA1C 8.4 (5/14); Continue Lantus and Add SSI, and monitor Glucose q4 hours, and check Hba1C in AM.  5. CAD- stable; ECG 1st degree av block with sinus arrythmia; no chest pain, monitor  6. DVT prophylaxis with SCDs.  7. Dialysis Catheter Infection- Catheter replaced and on IV Vancomycin with dialysis treatments.  8. OSA on CPAP; on home oxygen; con O2, and start CPAP qhs      Code Status: full  Family Communication: discussed with [name] Spouse [phone]  Disposition Plan: likely home    Consultants:  Nephrology   GI  Procedures:  X   Antibiotics:  vanc with HD (indicate start date, and stop date if known)  HPI/Subjective: Patient denies chest pain, no SOB, but reports leg pain   Objective: Filed Vitals:   08/30/12 0800  BP: 104/51  Pulse: 78  Temp: 98.3 F (36.8 C)  Resp: 14    Intake/Output Summary (Last 24 hours) at 08/30/12 0850 Last data filed at 08/30/12 0800  Gross per 24 hour  Intake    590 ml  Output       0 ml  Net    590 ml   Filed Weights   08/30/12 0200  Weight: 117.4 kg (258 lb 13.1 oz)    Exam:   General:  Awake, reports leg pain   Cardiovascular: S1, S2,   Respiratory: LL few crackles   Abdomen: obese, NT, +BS   Musculoskeletal: no edema    Data Reviewed: Basic Metabolic Panel:  Recent Labs Lab 08/29/12 2344 08/30/12 0315  NA 139 139  K 3.7 3.6  CL 98 98  CO2  --  29  GLUCOSE 130* 127*  BUN 32* 33*  CREATININE 7.40* 7.49*  CALCIUM  --  9.4   Liver Function Tests: No results found for this basename: AST, ALT, ALKPHOS, BILITOT, PROT, ALBUMIN,  in the last 168 hours No results found for this basename: LIPASE, AMYLASE,  in the last 168 hours No results found for this basename: AMMONIA,  in the last 168 hours CBC:  Recent Labs Lab 08/29/12 2326 08/29/12 2344 08/30/12 0315  WBC 15.9*  --  14.9*  HGB 10.6* 11.2* 11.0*  HCT 33.5* 33.0* 33.8*  MCV 97.7  --  97.4  PLT 224  --  205   Cardiac Enzymes: No results found for this basename: CKTOTAL, CKMB, CKMBINDEX, TROPONINI,  in the last 168 hours BNP (last 3 results) No results found for this basename: PROBNP,  in the last 8760 hours CBG:  Recent Labs Lab 08/30/12 0234  GLUCAP 122*    Recent Results (from the past 240 hour(s))  MRSA PCR SCREENING     Status: None   Collection Time    08/30/12  4:33 AM      Result Value Range Status   MRSA by PCR NEGATIVE  NEGATIVE Final   Comment:            The GeneXpert MRSA Assay (FDA     approved for NASAL specimens     only), is one component of a     comprehensive MRSA colonization     surveillance program. It is not     intended to diagnose MRSA     infection nor to guide or     monitor treatment for     MRSA infections.     Studies: Dg Abd Acute W/chest  08/30/2012   CLINICAL DATA:  GI bleeding. End-stage renal disease. History of CHF, hypertension.  EXAM: ACUTE ABDOMEN SERIES (ABDOMEN 2 VIEW & CHEST 1 VIEW)  COMPARISON:  CHEST x-ray 05/07/2012   FINDINGS: Left dialysis catheter remains in stable position. Cardiomegaly. Mild vascular congestion without overt edema. No effusions.  Nonobstructive bowel gas pattern. No free air organomegaly. No suspicious calcification.  IMPRESSION: No evidence of bowel obstruction or free air.  Cardiomegaly, vascular congestion   Electronically Signed   By: Charlett Nose   On: 08/30/2012 00:52    Scheduled Meds: . artificial tears  1 application Right Eye BID  . b complex-vitamin c-folic acid  1 tablet Oral Daily  . brimonidine  1 drop Both Eyes BID  . calcium acetate  2,668 mg Oral TID WC  . cinacalcet  30 mg Oral Q breakfast  . insulin aspart  0-9 Units Subcutaneous Q4H  . insulin glargine  35 Units Subcutaneous BID  . midodrine  10 mg Oral TID WC  . pantoprazole (PROTONIX) IV  40 mg Intravenous Q24H  . polyvinyl alcohol  1 drop Both Eyes Q2H  . rOPINIRole  0.25 mg Oral Custom  . rOPINIRole  0.5 mg Oral QHS  . simvastatin  20 mg Oral q1800  . cyanocobalamin  1,000 mcg Oral Daily   Continuous Infusions: . sodium chloride 50 mL/hr at 08/30/12 0200    Principal Problem:   GI bleeding Active Problems:   ESRD (end stage renal disease) on dialysis   Anemia   Diabetes mellitus   CAD (coronary artery disease)    Time spent: > 30 minutes     [name]  [facility] Pager [phone]. If 7PM-7AM, please contact night-coverage at [email or web address], password [facility] 08/30/2012, 8:50 AM  LOS: 1 day

## 2012-08-30 NOTE — Consult Note (Signed)
[facility] Consult Note  Referring Provider: No ref. provider found Primary Care Physician:  [name], MD Primary Gastroenterologist:  Dr.  Antony Contras Complaint: Rectal bleeding HPI: [name] is an 67 y.o. white male  male who was apparently noticed to be having rectal bleeding either by his wife for bile dialysis attendance yesterday. Continue aware of it. He has not been having any abdominal pain but was transferred here for admission. His hemoglobin today was 11 and on recheck 9.5. This seems to be post to his baseline. He states he has had 2 colonoscopies at the [facility] hospital but is not now long ago. We do not have any record of this.  Past Medical History  Diagnosis Date  . CHF (congestive heart failure)   . CAD (coronary artery disease) 2006  . COPD (chronic obstructive pulmonary disease)   . Hyperlipidemia   . ESRD (end stage renal disease) on dialysis started 08/2006    MTTS [facility]; "getting ready to have it on TTS" (05/07/2012)  . Type II diabetes mellitus     etiology of ESRD  . Diabetic blindness   . Hypertension   . MI, old 2006  . Asthma   . Pneumonia     "everytime I take a cold" (05/07/2012)  . Bronchiectasis   . Shortness of breath     "can happen at anytime" (05/07/2012)  . GERD (gastroesophageal reflux disease)   . Stroke 2006    "numb on left side since"  05/07/2012  . On home oxygen therapy     "3L at night mostly; also prn" (05/07/2012)    Past Surgical History  Procedure Laterality Date  . Av fistula placement Right 09/2006    "used it once" (05/07/2012)  . Appendectomy  ~ 1958  . Insertion of dialysis catheter Left 09/2006    "chest" (05/07/2012)  . Total hip arthroplasty Left 1997  . Repair ankle ligament Left 1981  . Cataract extraction w/ intraocular lens implant Left ?2012    Medications Prior to Admission  Medication Sig Dispense Refill  . artificial tears (LACRILUBE) OINT ophthalmic ointment Place 1 application into the right eye 2  (two) times daily.      Marland Kitchen aspirin 325 MG tablet Take 325 mg by mouth daily.      . brimonidine (ALPHAGAN) 0.2 % ophthalmic solution Place 1 drop into both eyes 2 (two) times daily.      . calcium acetate (PHOSLO) 667 MG capsule Take 4 capsules (2,668 mg total) by mouth 3 (three) times daily with meals.  360 capsule  0  . carboxymethylcellulose (REFRESH CELLUVISC) 1 % ophthalmic solution Place 1 drop into both eyes every 2 (two) hours.      . cinacalcet (SENSIPAR) 30 MG tablet Take 30 mg by mouth daily.      . cyanocobalamin 500 MCG tablet Take 1,000 mcg by mouth daily.      . folic acid-vitamin b complex-vitamin c-selenium-zinc (DIALYVITE) 3 MG TABS Take 1 tablet by mouth daily.      Marland Kitchen gabapentin (NEURONTIN) 100 MG capsule Take 300 mg by mouth at bedtime as needed (nerve pain).      . insulin aspart (NOVOLOG) 100 UNIT/ML injection Inject 26 Units into the skin 3 (three) times daily with meals.      . insulin glargine (LANTUS) 100 UNIT/ML injection Inject 35 Units into the skin 2 (two) times daily.      . midodrine (PROAMATINE) 10 MG tablet Take 1 tablet (10 mg total)  by mouth 3 (three) times daily.  30 tablet  0  . omeprazole (PRILOSEC) 20 MG capsule Take 20 mg by mouth 2 (two) times daily.       Marland Kitchen oxycodone (OXY-IR) 5 MG capsule Take 10 mg by mouth every 6 (six) hours as needed for pain.       . pravastatin (PRAVACHOL) 40 MG tablet Take 40 mg by mouth at bedtime.       Marland Kitchen rOPINIRole (REQUIP) 0.25 MG tablet Take 0.25-0.5 mg by mouth 3 (three) times daily. 1 tablet twice a day and 2 tablets at bedtime.      . sennosides-docusate sodium (SENOKOT-S) 8.6-50 MG tablet Take 1 tablet by mouth 2 (two) times daily as needed for constipation. For constipation      . VANCOMYCIN HCL IV Inject into the vein 4 (four) times a week. Tuesday, Thursday, Friday and Saturday during dialysis        Allergies:  Allergies  Allergen Reactions  . Cefepime Itching  . Penicillins Hives and Swelling    Family History   Problem Relation Age of Onset  . Diabetes Mellitus II Father     Social History:  reports that he quit smoking about 28 years ago. His smoking use included Cigarettes. He has a 60 pack-year smoking history. He has never used smokeless tobacco. He reports that  drinks alcohol. He reports that he does not use illicit drugs.  Review of Systems: negative except as above   Blood pressure 128/54, pulse 75, temperature 97.4 F (36.3 C), temperature source Oral, resp. rate 19, height 6' (1.829 m), weight 117.4 kg (258 lb 13.1 oz), SpO2 99.00%. Head: Normocephalic, without obvious abnormality, atraumatic Neck: no adenopathy, no carotid bruit, no JVD, supple, symmetrical, trachea midline and thyroid not enlarged, symmetric, no tenderness/mass/nodules Resp: clear to auscultation bilaterally Cardio: regular rate and rhythm, S1, S2 normal, no murmur, click, rub or gallop GI: Abdomen soft distended nontender Extremities: extremities normal, atraumatic, no cyanosis or edema  Results for orders placed during the hospital encounter of 08/29/12 (from the past 48 hour(s))  CBC     Status: Abnormal   Collection Time    08/29/12 11:26 PM      Result Value Range   WBC 15.9 (*) 4.0 - 10.5 K/uL   RBC 3.43 (*) 4.22 - 5.81 MIL/uL   Hemoglobin 10.6 (*) 13.0 - 17.0 g/dL   HCT 14.7 (*) 82.9 - 56.2 %   MCV 97.7  78.0 - 100.0 fL   MCH 30.9  26.0 - 34.0 pg   MCHC 31.6  30.0 - 36.0 g/dL   RDW 13.0  86.5 - 78.4 %   Platelets 224  150 - 400 K/uL  TYPE AND SCREEN     Status: None   Collection Time    08/29/12 11:26 PM      Result Value Range   ABO/RH(D) A POS     Antibody Screen NEG     Sample Expiration 09/01/2012    POCT I-STAT, CHEM 8     Status: Abnormal   Collection Time    08/29/12 11:44 PM      Result Value Range   Sodium 139  135 - 145 mEq/L   Potassium 3.7  3.5 - 5.1 mEq/L   Chloride 98  96 - 112 mEq/L   BUN 32 (*) 6 - 23 mg/dL   Creatinine, Ser 6.96 (*) 0.50 - 1.35 mg/dL   Glucose, Bld 295  (*) 70 - 99 mg/dL  Calcium, Ion 1.22  1.13 - 1.30 mmol/L   TCO2 29  0 - 100 mmol/L   Hemoglobin 11.2 (*) 13.0 - 17.0 g/dL   HCT 16.1 (*) 09.6 - 04.5 %  OCCULT BLOOD, POC DEVICE     Status: Abnormal   Collection Time    08/30/12 12:49 AM      Result Value Range   Fecal Occult Bld POSITIVE (*) NEGATIVE  GLUCOSE, CAPILLARY     Status: Abnormal   Collection Time    08/30/12  2:34 AM      Result Value Range   Glucose-Capillary 122 (*) 70 - 99 mg/dL   Comment 1 Documented in Chart     Comment 2 Notify RN    BASIC METABOLIC PANEL     Status: Abnormal   Collection Time    08/30/12  3:15 AM      Result Value Range   Sodium 139  135 - 145 mEq/L   Potassium 3.6  3.5 - 5.1 mEq/L   Chloride 98  96 - 112 mEq/L   CO2 29  19 - 32 mEq/L   Glucose, Bld 127 (*) 70 - 99 mg/dL   BUN 33 (*) 6 - 23 mg/dL   Creatinine, Ser 4.09 (*) 0.50 - 1.35 mg/dL   Calcium 9.4  8.4 - 81.1 mg/dL   GFR calc non Af Amer 7 (*) >90 mL/min   GFR calc Af Amer 8 (*) >90 mL/min   Comment: (NOTE)     The eGFR has been calculated using the CKD EPI equation.     This calculation has not been validated in all clinical situations.     eGFR's persistently <90 mL/min signify possible Chronic Kidney     Disease.  CBC     Status: Abnormal   Collection Time    08/30/12  3:15 AM      Result Value Range   WBC 14.9 (*) 4.0 - 10.5 K/uL   RBC 3.47 (*) 4.22 - 5.81 MIL/uL   Hemoglobin 11.0 (*) 13.0 - 17.0 g/dL   HCT 91.4 (*) 78.2 - 95.6 %   MCV 97.4  78.0 - 100.0 fL   MCH 31.7  26.0 - 34.0 pg   MCHC 32.5  30.0 - 36.0 g/dL   RDW 21.3  08.6 - 57.8 %   Platelets 205  150 - 400 K/uL  GLUCOSE, CAPILLARY     Status: Abnormal   Collection Time    08/30/12  4:01 AM      Result Value Range   Glucose-Capillary 120 (*) 70 - 99 mg/dL   Comment 1 Documented in Chart     Comment 2 Notify RN    MRSA PCR SCREENING     Status: None   Collection Time    08/30/12  4:33 AM      Result Value Range   MRSA by PCR NEGATIVE  NEGATIVE    Comment:            The GeneXpert MRSA Assay (FDA     approved for NASAL specimens     only), is one component of a     comprehensive MRSA colonization     surveillance program. It is not     intended to diagnose MRSA     infection nor to guide or     monitor treatment for     MRSA infections.  GLUCOSE, CAPILLARY     Status: Abnormal   Collection Time    08/30/12  8:05 AM      Result Value Range   Glucose-Capillary 112 (*) 70 - 99 mg/dL   Comment 1 Documented in Chart     Comment 2 Notify RN    HEMOGLOBIN AND HEMATOCRIT, BLOOD     Status: Abnormal   Collection Time    08/30/12 10:20 AM      Result Value Range   Hemoglobin 9.5 (*) 13.0 - 17.0 g/dL   HCT 16.1 (*) 09.6 - 04.5 %   Dg Abd Acute W/chest  08/30/2012   CLINICAL DATA:  GI bleeding. End-stage renal disease. History of CHF, hypertension.  EXAM: ACUTE ABDOMEN SERIES (ABDOMEN 2 VIEW & CHEST 1 VIEW)  COMPARISON:  CHEST x-ray 05/07/2012  FINDINGS: Left dialysis catheter remains in stable position. Cardiomegaly. Mild vascular congestion without overt edema. No effusions.  Nonobstructive bowel gas pattern. No free air organomegaly. No suspicious calcification.  IMPRESSION: No evidence of bowel obstruction or free air.  Cardiomegaly, vascular congestion   Electronically Signed   By: Charlett Nose   On: 08/30/2012 00:52    Assessment: Rectal bleeding, probably lower GI source could be perianal or colitis or diverticular Plan:  Will observe stools and hemoglobin over the next 24 hours, probably allow another dialysis session and decide whether we need to perform sigmoidoscopy or colonoscopy. We'll follow with you. Would remain on clear liquid diet for now. Alejandra Hunt C 08/30/2012, 12:46 PM

## 2012-08-30 NOTE — Procedures (Signed)
I was present at this dialysis session. I have reviewed the session itself and made appropriate changes.   Vinson Moselle  MD Pager [phone]    Cell  [phone] 08/30/2012, 4:29 PM

## 2012-08-30 NOTE — ED Notes (Signed)
Attempted floor report x 1

## 2012-08-30 NOTE — H&P (Addendum)
[facility] History and Physical  [name] YQM:[id number] DOB: 07-23-1945 DOA: 08/29/2012  Referring physician: EDP PCP: [name], MD  Specialists:   Chief Complaint: Bloody Stools  HPI: [name] is a 67 y.o. male with history of ESRD on HD, DM@, and CD who presents to the ED with complaints of passing bloody stools all day.   He denies having ABD pain or nausea or vomiting or fevers or chills.   He had an extra dialysis treatment today but was unable  To complete the session.   He had his dialysis catheter changed due to infection and has been receiving IV Vancomycin with dialysis and had to have an additional session on Friday due to volume overload.   His ususal dialysis schedule is on Tues Thursday and Saturdays.      Review of Systems: The patient denies anorexia, fever, chills, headaches, weight loss,, vision loss, diplopia, dizziness, decreased hearing, rhinitis, hoarseness, chest pain, syncope, dyspnea on exertion, peripheral edema, balance deficits, cough, hemoptysis, abdominal pain, nausea, vomiting, diarrhea, constipation, hematemesis, melena, hematochezia, severe indigestion/heartburn, dysuria, hematuria, incontinence, muscle weakness, suspicious skin lesions, transient blindness, difficulty walking, depression, unusual weight change, abnormal bleeding, enlarged lymph nodes, angioedema, and breast masses.    Past Medical History  Diagnosis Date  . CHF (congestive heart failure)   . CAD (coronary artery disease) 2006  . COPD (chronic obstructive pulmonary disease)   . Hyperlipidemia   . ESRD (end stage renal disease) on dialysis started 08/2006    MTTS [place]; "getting ready to have it on TTS" (05/07/2012)  . Type II diabetes mellitus     etiology of ESRD  . Diabetic blindness   . Hypertension   . MI, old 2006  . Asthma   . Pneumonia     "everytime I take a cold" (05/07/2012)  . Bronchiectasis   . Shortness of breath     "can happen at anytime"  (05/07/2012)  . GERD (gastroesophageal reflux disease)   . Stroke 2006    "numb on left side since"  05/07/2012  . On home oxygen therapy     "3L at night mostly; also prn" (05/07/2012)    Past Surgical History  Procedure Laterality Date  . Av fistula placement Right 09/2006    "used it once" (05/07/2012)  . Appendectomy  ~ 1958  . Insertion of dialysis catheter Left 09/2006    "chest" (05/07/2012)  . Total hip arthroplasty Left 1997  . Repair ankle ligament Left 1981  . Cataract extraction w/ intraocular lens implant Left ?2012    Prior to Admission medications   Medication Sig Start Date End Date Taking? Authorizing Provider  artificial tears (LACRILUBE) OINT ophthalmic ointment Place 1 application into the right eye 2 (two) times daily.   Yes Historical Provider, MD  aspirin 325 MG tablet Take 325 mg by mouth daily.   Yes Historical Provider, MD  brimonidine (ALPHAGAN) 0.2 % ophthalmic solution Place 1 drop into both eyes 2 (two) times daily.   Yes Historical Provider, MD  calcium acetate (PHOSLO) 667 MG capsule Take 4 capsules (2,668 mg total) by mouth 3 (three) times daily with meals. 11/04/11  Yes Joseph Art, DO  carboxymethylcellulose (REFRESH CELLUVISC) 1 % ophthalmic solution Place 1 drop into both eyes every 2 (two) hours.   Yes Historical Provider, MD  cinacalcet (SENSIPAR) 30 MG tablet Take 30 mg by mouth daily.   Yes Historical Provider, MD  cyanocobalamin 500 MCG tablet Take 1,000 mcg by mouth  daily.   Yes Historical Provider, MD  folic acid-vitamin b complex-vitamin c-selenium-zinc (DIALYVITE) 3 MG TABS Take 1 tablet by mouth daily.   Yes Historical Provider, MD  gabapentin (NEURONTIN) 100 MG capsule Take 300 mg by mouth at bedtime as needed (nerve pain).   Yes Historical Provider, MD  insulin aspart (NOVOLOG) 100 UNIT/ML injection Inject 26 Units into the skin 3 (three) times daily with meals.   Yes Historical Provider, MD  insulin glargine (LANTUS) 100 UNIT/ML injection Inject  35 Units into the skin 2 (two) times daily.   Yes Historical Provider, MD  midodrine (PROAMATINE) 10 MG tablet Take 1 tablet (10 mg total) by mouth 3 (three) times daily. 05/11/12  Yes Zannie Cove, MD  omeprazole (PRILOSEC) 20 MG capsule Take 20 mg by mouth 2 (two) times daily.    Yes Historical Provider, MD  oxycodone (OXY-IR) 5 MG capsule Take 10 mg by mouth every 6 (six) hours as needed for pain.    Yes Historical Provider, MD  pravastatin (PRAVACHOL) 40 MG tablet Take 40 mg by mouth at bedtime.    Yes Historical Provider, MD  rOPINIRole (REQUIP) 0.25 MG tablet Take 0.25-0.5 mg by mouth 3 (three) times daily. 1 tablet twice a day and 2 tablets at bedtime.   Yes Historical Provider, MD  sennosides-docusate sodium (SENOKOT-S) 8.6-50 MG tablet Take 1 tablet by mouth 2 (two) times daily as needed for constipation. For constipation   Yes Historical Provider, MD  VANCOMYCIN HCL IV Inject into the vein 4 (four) times a week. Tuesday, Thursday, Friday and Saturday during dialysis   Yes Historical Provider, MD    Allergies  Allergen Reactions  . Cefepime Itching  . Penicillins Hives and Swelling    Social History:  reports that he quit smoking about 28 years ago. His smoking use included Cigarettes. He has a 60 pack-year smoking history. He has never used smokeless tobacco. He reports that  drinks alcohol. He reports that he does not use illicit drugs.     Family History  Problem Relation Age of Onset  . Diabetes Mellitus II Father      Physical Exam:  GEN:  Pleasant  Morbidly Obese  67 y.o. Caucasian  male  examined  and in no acute distress; cooperative with exam Filed Vitals:   08/30/12 0000 08/30/12 0100 08/30/12 0115 08/30/12 0130  BP: 116/48 109/48 114/47 123/60  Pulse: 82 78 75 78  Temp:      TempSrc:      Resp:  13 13 15   SpO2: 100% 99% 99% 99%   Blood pressure 123/60, pulse 78, temperature 98.1 F (36.7 C), temperature source Oral, resp. rate 15, SpO2 99.00%. PSYCH: He is  alert and oriented x4; does not appear anxious does not appear depressed; affect is normal HEENT: Normocephalic and Atraumatic, Mucous membranes pink; PERRLA; EOM intact; Fundi:  Unable to visualize;  No scleral icterus, Nares: Patent, Oropharynx: Clear, Fair Dentition, Neck:  FROM, no cervical lymphadenopathy nor thyromegaly or carotid bruit; no JVD; Breasts:: Not examined CHEST WALL: No tenderness CHEST: Normal respiration, clear to auscultation bilaterally HEART: Regular rate and rhythm; no murmurs rubs or gallops BACK: No kyphosis or scoliosis; no CVA tenderness ABDOMEN: Positive Bowel Sounds,  Obese, soft non-tender; no masses, no organomegaly, no pannus; no intertriginous candida. Rectal Exam: Not done EXTREMITIES: No cyanosis, clubbing or edema; no ulcerations. Genitalia: not examined PULSES: 2+ and symmetric SKIN: Normal hydration no rash or ulceration CNS: Cranial nerves 2-12 grossly intact no focal  neurologic deficit    Labs on Admission:  Basic Metabolic Panel:  Recent Labs Lab 08/29/12 2344  NA 139  K 3.7  CL 98  GLUCOSE 130*  BUN 32*  CREATININE 7.40*   Liver Function Tests: No results found for this basename: AST, ALT, ALKPHOS, BILITOT, PROT, ALBUMIN,  in the last 168 hours No results found for this basename: LIPASE, AMYLASE,  in the last 168 hours No results found for this basename: AMMONIA,  in the last 168 hours CBC:  Recent Labs Lab 08/29/12 2326 08/29/12 2344  WBC 15.9*  --   HGB 10.6* 11.2*  HCT 33.5* 33.0*  MCV 97.7  --   PLT 224  --    Cardiac Enzymes: No results found for this basename: CKTOTAL, CKMB, CKMBINDEX, TROPONINI,  in the last 168 hours  BNP (last 3 results) No results found for this basename: PROBNP,  in the last 8760 hours CBG: No results found for this basename: GLUCAP,  in the last 168 hours  Radiological Exams on Admission: Dg Abd Acute W/chest  08/30/2012   CLINICAL DATA:  GI bleeding. End-stage renal disease. History of  CHF, hypertension.  EXAM: ACUTE ABDOMEN SERIES (ABDOMEN 2 VIEW & CHEST 1 VIEW)  COMPARISON:  CHEST x-ray 05/07/2012  FINDINGS: Left dialysis catheter remains in stable position. Cardiomegaly. Mild vascular congestion without overt edema. No effusions.  Nonobstructive bowel gas pattern. No free air organomegaly. No suspicious calcification.  IMPRESSION: No evidence of bowel obstruction or free air.  Cardiomegaly, vascular congestion   Electronically Signed   By: Charlett Nose   On: 08/30/2012 00:52        Assessment/Plan Principal Problem:   GI bleeding Active Problems:   ESRD (end stage renal disease) on dialysis   Anemia   Diabetes mellitus   CAD (coronary artery disease)    Dialysis Catheter Infection   1.   GI Bleeding-  Admit to Stepdown Bed, Monitor H/H, IV Protonix, GI Consult in AM.  Discontinue Aspirin  2.   ESRD on HD-  Notify Dialysis team in AM.    3.   Anemia due to #1 and #2, monitor H/Hs.    4.   DM2-  Continue Lantus and Add SSI, and monitor Glucose q4 hours, and check Hba1C in AM.    5.   CAD- stable  6.  DVT prophylaxis with SCDs.    7.  Dialysis Catheter Infection-  Catheter replaced and on IV Vancomycin with dialysis treatments.         Code Status:   FULL CODE Family Communication:    Family at Bedside Disposition Plan:     Inpatient  Time spent:  41 Minutes  Ron Parker [facility] Pager [phone]  If 7PM-7AM, please contact night-coverage [email or web address] Password [email or web address] 08/30/2012, 2:10 AM

## 2012-08-30 NOTE — Consult Note (Signed)
Renal Service Consult Note [facility]  [name] 08/30/2012 [name] D Requesting Physician:  Dr [name]  Reason for Consult:  ESRD patient with BRBPR HPI: The patient is a 67 y.o. year-old with hx of DM2, ESRD, COPD, CAD, CVA, home O2 who presents with c/o bright red blood per rectum.    Patient is on HD at [facility] HD on TTS schedule.  He started dialysis around 2008.  He is left-handed. He had an AVG placed R forearm and developed severe steal symptoms per pt and the R hand is poorly functional now.  For this reason he has refused any other permanent access and is dialyzed via tunneled cath long-term.    2 weeks ago pt had a fever at HD, blood cx's were + for CNSS, PCN sensitive.  He rec'd one week IV vanc then was changed to IV Ancef , fevers resolved, cath was not removed. Finishes IV Ancef on 9/6.    Currently has no compaints, denies sob, cp, abd pain, n/v. Says rectal bleeding started after several day bout with nonbloody diarrhea.    ROS  no sob, cp  no confusion  no jt pain  no skin rash  no cough  Past Medical History  Past Medical History  Diagnosis Date  . CHF (congestive heart failure)   . CAD (coronary artery disease) 2006  . COPD (chronic obstructive pulmonary disease)   . Hyperlipidemia   . ESRD (end stage renal disease) on dialysis started 08/2006    MTTS [place]; "getting ready to have it on TTS" (05/07/2012)  . Type II diabetes mellitus     etiology of ESRD  . Diabetic blindness   . Hypertension   . MI, old 2006  . Asthma   . Pneumonia     "everytime I take a cold" (05/07/2012)  . Bronchiectasis   . Shortness of breath     "can happen at anytime" (05/07/2012)  . GERD (gastroesophageal reflux disease)   . Stroke 2006    "numb on left side since"  05/07/2012  . On home oxygen therapy     "3L at night mostly; also prn" (05/07/2012)   Past Surgical History  Past Surgical History  Procedure Laterality Date  . Av fistula placement  Right 09/2006    "used it once" (05/07/2012)  . Appendectomy  ~ 1958  . Insertion of dialysis catheter Left 09/2006    "chest" (05/07/2012)  . Total hip arthroplasty Left 1997  . Repair ankle ligament Left 1981  . Cataract extraction w/ intraocular lens implant Left ?2012   Family History  Family History  Problem Relation Age of Onset  . Diabetes Mellitus II Father    Social History  reports that he quit smoking about 28 years ago. His smoking use included Cigarettes. He has a 60 pack-year smoking history. He has never used smokeless tobacco. He reports that  drinks alcohol. He reports that he does not use illicit drugs. Allergies  Allergies  Allergen Reactions  . Cefepime Itching  . Penicillins Hives and Swelling   Home medications Prior to Admission medications   Medication Sig Start Date End Date Taking? Authorizing Provider  artificial tears (LACRILUBE) OINT ophthalmic ointment Place 1 application into the right eye 2 (two) times daily.   Yes Historical Provider, MD  aspirin 325 MG tablet Take 325 mg by mouth daily.   Yes Historical Provider, MD  brimonidine (ALPHAGAN) 0.2 % ophthalmic solution Place 1 drop into both eyes 2 (two) times  daily.   Yes Historical Provider, MD  calcium acetate (PHOSLO) 667 MG capsule Take 4 capsules (2,668 mg total) by mouth 3 (three) times daily with meals. 11/04/11  Yes Joseph Art, DO  carboxymethylcellulose (REFRESH CELLUVISC) 1 % ophthalmic solution Place 1 drop into both eyes every 2 (two) hours.   Yes Historical Provider, MD  cinacalcet (SENSIPAR) 30 MG tablet Take 30 mg by mouth daily.   Yes Historical Provider, MD  cyanocobalamin 500 MCG tablet Take 1,000 mcg by mouth daily.   Yes Historical Provider, MD  folic acid-vitamin b complex-vitamin c-selenium-zinc (DIALYVITE) 3 MG TABS Take 1 tablet by mouth daily.   Yes Historical Provider, MD  gabapentin (NEURONTIN) 100 MG capsule Take 300 mg by mouth at bedtime as needed (nerve pain).   Yes Historical  Provider, MD  insulin aspart (NOVOLOG) 100 UNIT/ML injection Inject 26 Units into the skin 3 (three) times daily with meals.   Yes Historical Provider, MD  insulin glargine (LANTUS) 100 UNIT/ML injection Inject 35 Units into the skin 2 (two) times daily.   Yes Historical Provider, MD  midodrine (PROAMATINE) 10 MG tablet Take 1 tablet (10 mg total) by mouth 3 (three) times daily. 05/11/12  Yes Zannie Cove, MD  omeprazole (PRILOSEC) 20 MG capsule Take 20 mg by mouth 2 (two) times daily.    Yes Historical Provider, MD  oxycodone (OXY-IR) 5 MG capsule Take 10 mg by mouth every 6 (six) hours as needed for pain.    Yes Historical Provider, MD  pravastatin (PRAVACHOL) 40 MG tablet Take 40 mg by mouth at bedtime.    Yes Historical Provider, MD  rOPINIRole (REQUIP) 0.25 MG tablet Take 0.25-0.5 mg by mouth 3 (three) times daily. 1 tablet twice a day and 2 tablets at bedtime.   Yes Historical Provider, MD  sennosides-docusate sodium (SENOKOT-S) 8.6-50 MG tablet Take 1 tablet by mouth 2 (two) times daily as needed for constipation. For constipation   Yes Historical Provider, MD  VANCOMYCIN HCL IV Inject into the vein 4 (four) times a week. Tuesday, Thursday, Friday and Saturday during dialysis   Yes Historical Provider, MD   Liver Function Tests No results found for this basename: AST, ALT, ALKPHOS, BILITOT, PROT, ALBUMIN,  in the last 168 hours No results found for this basename: LIPASE, AMYLASE,  in the last 168 hours CBC  Recent Labs Lab 08/29/12 2326 08/29/12 2344 08/30/12 0315 08/30/12 1020  WBC 15.9*  --  14.9*  --   HGB 10.6* 11.2* 11.0* 9.5*  HCT 33.5* 33.0* 33.8* 29.4*  MCV 97.7  --  97.4  --   PLT 224  --  205  --    Basic Metabolic Panel  Recent Labs Lab 08/29/12 2344 08/30/12 0315  NA 139 139  K 3.7 3.6  CL 98 98  CO2  --  29  GLUCOSE 130* 127*  BUN 32* 33*  CREATININE 7.40* 7.49*  CALCIUM  --  9.4   Physical Exam:  Blood pressure 128/54, pulse 75, temperature 97.4 F  (36.3 C), temperature source Oral, resp. rate 19, height 6' (1.829 m), weight 117.4 kg (258 lb 13.1 oz), SpO2 99.00%. Gen: older adult male, obese, nasal voice, no distress Skin: no rash, cyanosis HEENT:  EOMI, sclera anicteric, throat moist, +periorbital edema Neck: no JVD, no bruits Chest: decreased at bases, no rales or wheezing, decreased BS throughout CV: regular, distant HS, no rub or murmur noted, pedal pulses diminished Abdomen: soft, obese, tender LUQ, +BS no ascites, liver  down 4cm Ext: no LE or UE edema, no joint effusion, no gangrene/ulcers Neuro: alert, Ox3, nonfocal Access: L IJ cath with very mild erythema at exit site, no drainage expressible   Outpatient HD: (TTS at [facility] HD) 3hr 45 min  EDW 112kg     Bath 2K/2.25Ca   L IJ cath  Profile 4  Heparin 8000 / 2000 EPO 1000 2x/wk   Hectorol 2ug/hd   Ancef 2gm 8/23 > 9/6 (fever, +blood cx's for coag neg staph from 8/14, cath not removed)   Impression: 1. GI bleed with hematochezia- GI evaluating 2. Recent coag neg staph bacteremia- on Ancef 2gm after HD tiw thru 9/6 3. ESRD, cont TTS HD 4. Hypotension / volume- up 4-5 kg by wts, takes midodrine for low BP's 5. Anemia- cont epo  6. Sec HPT- cont vit D, sensipar and phoslo 7. COPD on home O2 8. Diabetic blindness 9. CAD, remote MI 10. CVA 2006, (numb left side is sequelae)  Plan- HD today, abx, GI work up    [employer]  MD Pager [phone]    Cell  [phone] 08/30/2012, 11:48 AM

## 2012-08-30 NOTE — ED Notes (Signed)
Admission MD (Dr. Lovell Sheehan) at bedside

## 2012-08-30 NOTE — ED Notes (Signed)
Patient in xray 

## 2012-08-31 DIAGNOSIS — I959 Hypotension, unspecified: Secondary | ICD-10-CM

## 2012-08-31 LAB — GLUCOSE, CAPILLARY
Glucose-Capillary: 45 mg/dL — ABNORMAL LOW (ref 70–99)
Glucose-Capillary: 50 mg/dL — ABNORMAL LOW (ref 70–99)
Glucose-Capillary: 150 mg/dL — ABNORMAL HIGH (ref 70–99)

## 2012-08-31 LAB — HEMOGLOBIN AND HEMATOCRIT, BLOOD
Hemoglobin: 9.9 g/dL — ABNORMAL LOW (ref 13.0–17.0)
Hemoglobin: 9.7 g/dL — ABNORMAL LOW (ref 13.0–17.0)
Hemoglobin: 10 g/dL — ABNORMAL LOW (ref 13.0–17.0)

## 2012-08-31 MED ORDER — CALCIUM ACETATE 667 MG PO CAPS
2001.0000 mg | ORAL_CAPSULE | Freq: Three times a day (TID) | ORAL | Status: DC
  Administered 2012-08-31 – 2012-09-01 (×4): 2001 mg via ORAL
  Filled 2012-08-31 (×6): qty 3

## 2012-08-31 MED ORDER — DEXTROSE 50 % IV SOLN
25.0000 mL | Freq: Once | INTRAVENOUS | Status: AC | PRN
  Administered 2012-08-31: 25 mL via INTRAVENOUS

## 2012-08-31 MED ORDER — CEFAZOLIN SODIUM-DEXTROSE 2-3 GM-% IV SOLR: 2.0000 g | INTRAVENOUS | Status: DC

## 2012-08-31 MED ORDER — DEXTROSE 50 % IV SOLN
INTRAVENOUS | Status: AC
  Filled 2012-08-31: qty 50

## 2012-08-31 MED ORDER — INSULIN GLARGINE 100 UNIT/ML ~~LOC~~ SOLN
35.0000 [IU] | Freq: Two times a day (BID) | SUBCUTANEOUS | Status: DC
  Administered 2012-09-01: 35 [IU] via SUBCUTANEOUS
  Filled 2012-08-31 (×2): qty 0.35

## 2012-08-31 MED ORDER — DARBEPOETIN ALFA-POLYSORBATE 100 MCG/0.5ML IJ SOLN: 100.0000 ug | INTRAMUSCULAR | Status: DC

## 2012-08-31 NOTE — Progress Notes (Addendum)
[facility] PROGRESS NOTE  [name] WUJ:[id number] DOB: February 26, 1945 DOA: 08/29/2012 PCP: [name], MD  Assessment/Plan:  67 y.o. male with history of ESRD on HD, DM@, and CD who presents to the ED with complaints of passing bloody stools all day. He denies having ABD pain or nausea or vomiting or fevers or chills. He had an extra dialysis treatment today but was unable To complete the session. On IV Vancomycin with dialysis due to recent bacteremia; and had to have an additional session on Friday due to volume overload. His ususal dialysis schedule is on Tues Thursday and Saturdays.   1. GIB, +occult blood; hemodynamically stable; Hg remains stable; few episodes of intermittent diarrhea like BM with blood ; ? Colitis  appreciate GI input; evaluated for possible endoscopy;  -Monitor H/H, IV Protonix, Aspirin on hold  2. ESRD on HD- discussed with nephrologist for HD, appreciate comanagement  - Recent coag neg staph bacteremia- on Ancef 2gm after HD tiw thru 9/6 per nephrology  3. Anemia due to #1 and #2, monitor H/Hs.; on epo 4. DM2- HA1C 7.0 8/14); Continue Lantus and Add SSI, and monitor Glucose q4 hours  -cont lantus 35 units BID; hold mealtime insulins (26 units with each meal) until able to eat; cont ISS  5. CAD- stable; ECG 1st degree av block with sinus arrythmia; no chest pain, monitor  -h/o CVA L sided paraesthesia  6. DVT prophylaxis with SCDs.  7. OSA on CPAP; on home oxygen; con O2, refusing CPAP 8. Hypotension; takes midodrine for low BP's; intermittent episodes yesterday; resolved      Code Status: full  Family Communication: discussed with Rennels,[name] Spouse [phone]  Disposition Plan: likely home    Consultants:  Nephrology   GI  Procedures:  X   Antibiotics:  vanc with HD (indicate start date, and stop date if known)  HPI/Subjective: Patient denies chest pain, no SOB, but reports leg pain   Objective: Filed Vitals:   08/31/12  0700  BP:   Pulse:   Temp: 98.6 F (37 C)  Resp:     Intake/Output Summary (Last 24 hours) at 08/31/12 0825 Last data filed at 08/31/12 0400  Gross per 24 hour  Intake   1350 ml  Output   4200 ml  Net  -2850 ml   Filed Weights   08/30/12 1435 08/30/12 1827 08/31/12 0302  Weight: 119 kg (262 lb 5.6 oz) 114.3 kg (251 lb 15.8 oz) 115.5 kg (254 lb 10.1 oz)    Exam:   General:  Awake, reports leg pain   Cardiovascular: S1, S2,   Respiratory: LL few crackles   Abdomen: obese, NT, +BS   Musculoskeletal: no edema    Data Reviewed: Basic Metabolic Panel:  Recent Labs Lab 08/29/12 2344 08/30/12 0315 08/30/12 1520  NA 139 139 137  K 3.7 3.6 3.8  CL 98 98 97  CO2  --  29 29  GLUCOSE 130* 127* 137*  BUN 32* 33* 36*  CREATININE 7.40* 7.49* 8.23*  CALCIUM  --  9.4 9.4  PHOS  --   --  5.6*   Liver Function Tests:  Recent Labs Lab 08/30/12 1520  ALBUMIN 2.9*   No results found for this basename: LIPASE, AMYLASE,  in the last 168 hours No results found for this basename: AMMONIA,  in the last 168 hours CBC:  Recent Labs Lab 08/29/12 2326  08/30/12 0315 08/30/12 1020 08/30/12 1518 08/30/12 1949 08/31/12 0140  WBC 15.9*  --  14.9*  --  13.0*  --   --   HGB 10.6*  < > 11.0* 9.5* 9.4* 11.2* 9.9*  HCT 33.5*  < > 33.8* 29.4* 29.8* 34.8* 31.5*  MCV 97.7  --  97.4  --  98.7  --   --   PLT 224  --  205  --  223  --   --   < > = values in this interval not displayed. Cardiac Enzymes: No results found for this basename: CKTOTAL, CKMB, CKMBINDEX, TROPONINI,  in the last 168 hours BNP (last 3 results) No results found for this basename: PROBNP,  in the last 8760 hours CBG:  Recent Labs Lab 08/30/12 0805 08/30/12 1138 08/30/12 1918 08/30/12 2319 08/31/12 0301  GLUCAP 112* 111* 86 194* 97    Recent Results (from the past 240 hour(s))  MRSA PCR SCREENING     Status: None   Collection Time    08/30/12  4:33 AM      Result Value Range Status   MRSA by PCR  NEGATIVE  NEGATIVE Final   Comment:            The GeneXpert MRSA Assay (FDA     approved for NASAL specimens     only), is one component of a     comprehensive MRSA colonization     surveillance program. It is not     intended to diagnose MRSA     infection nor to guide or     monitor treatment for     MRSA infections.     Studies: Dg Abd Acute W/chest  08/30/2012   CLINICAL DATA:  GI bleeding. End-stage renal disease. History of CHF, hypertension.  EXAM: ACUTE ABDOMEN SERIES (ABDOMEN 2 VIEW & CHEST 1 VIEW)  COMPARISON:  CHEST x-ray 05/07/2012  FINDINGS: Left dialysis catheter remains in stable position. Cardiomegaly. Mild vascular congestion without overt edema. No effusions.  Nonobstructive bowel gas pattern. No free air organomegaly. No suspicious calcification.  IMPRESSION: No evidence of bowel obstruction or free air.  Cardiomegaly, vascular congestion   Electronically Signed   By: Charlett Nose   On: 08/30/2012 00:52    Scheduled Meds: . artificial tears  1 application Right Eye BID  . b complex-vitamin c-folic acid  1 tablet Oral Daily  . brimonidine  1 drop Both Eyes BID  . calcium acetate  2,668 mg Oral TID WC  . cinacalcet  30 mg Oral Q breakfast  . dextrose      . insulin aspart  0-9 Units Subcutaneous Q4H  . insulin glargine  35 Units Subcutaneous BID  . midodrine  10 mg Oral TID WC  . pantoprazole (PROTONIX) IV  40 mg Intravenous Q24H  . polyvinyl alcohol  1 drop Both Eyes BID  . rOPINIRole  0.25 mg Oral Custom  . rOPINIRole  0.5 mg Oral QHS  . simvastatin  20 mg Oral q1800  . cyanocobalamin  1,000 mcg Oral Daily   Continuous Infusions: . sodium chloride 50 mL/hr at 08/30/12 0200    Principal Problem:   GI bleeding Active Problems:   ESRD (end stage renal disease) on dialysis   Anemia   Diabetes mellitus   CAD (coronary artery disease)    Time spent: > 30 minutes     Esperanza Sheets  [facility] Pager [phone]. If 7PM-7AM, please contact  night-coverage at [email or web address], password [facility] 08/31/2012, 8:25 AM  LOS: 2 days

## 2012-08-31 NOTE — Progress Notes (Signed)
Subjective:  Awoken from sleep no cos, denies further stools/ hd yesterday Objective Vital signs in last 24 hours: Filed Vitals:   08/30/12 2230 08/30/12 2320 08/31/12 0302 08/31/12 0700  BP:  105/57 101/57   Pulse: 88 69 63   Temp:  98.1 F (36.7 C) 98 F (36.7 C) 98.6 F (37 C)  TempSrc:  Oral Oral Oral  Resp: 18 19 18    Height:      Weight:   115.5 kg (254 lb 10.1 oz)   SpO2:  98% 97%    Weight change: 1.6 kg (3 lb 8.4 oz)  Intake/Output Summary (Last 24 hours) at 08/31/12 1027 Last data filed at 08/31/12 0400  Gross per 24 hour  Intake   1010 ml  Output   4200 ml  Net  -3190 ml   Labs: Basic Metabolic Panel:  Recent Labs Lab 08/29/12 2344 08/30/12 0315 08/30/12 1520  NA 139 139 137  K 3.7 3.6 3.8  CL 98 98 97  CO2  --  29 29  GLUCOSE 130* 127* 137*  BUN 32* 33* 36*  CREATININE 7.40* 7.49* 8.23*  CALCIUM  --  9.4 9.4  PHOS  --   --  5.6*   Liver Function Tests:  Recent Labs Lab 08/30/12 1520  ALBUMIN 2.9*   No results found for this basename: LIPASE, AMYLASE,  in the last 168 hours No results found for this basename: AMMONIA,  in the last 168 hours CBC:  Recent Labs Lab 08/29/12 2326  08/30/12 0315  08/30/12 1518 08/30/12 1949 08/31/12 0140  WBC 15.9*  --  14.9*  --  13.0*  --   --   HGB 10.6*  < > 11.0*  < > 9.4* 11.2* 9.9*  HCT 33.5*  < > 33.8*  < > 29.8* 34.8* 31.5*  MCV 97.7  --  97.4  --  98.7  --   --   PLT 224  --  205  --  223  --   --   < > = values in this interval not displayed. CBG:  Recent Labs Lab 08/31/12 0301 08/31/12 0811 08/31/12 0814 08/31/12 0836 08/31/12 0925  GLUCAP 97 45* 50* 97 139*    Medications:   . artificial tears  1 application Right Eye BID  . b complex-vitamin c-folic acid  1 tablet Oral Daily  . brimonidine  1 drop Both Eyes BID  . calcium acetate  2,668 mg Oral TID WC  . cinacalcet  30 mg Oral Q breakfast  . insulin aspart  0-9 Units Subcutaneous Q4H  . [START ON 09/01/2012] insulin glargine  35  Units Subcutaneous BID  . midodrine  10 mg Oral TID WC  . pantoprazole (PROTONIX) IV  40 mg Intravenous Q24H  . polyvinyl alcohol  1 drop Both Eyes BID  . rOPINIRole  0.25 mg Oral Custom  . rOPINIRole  0.5 mg Oral QHS  . simvastatin  20 mg Oral q1800  . cyanocobalamin  1,000 mcg Oral Daily   I  have reviewed scheduled and prn medications.  Physical Exam: General: alert obese wm nad Heart: RRR  With distant sounds, no murmur or rub Lungs: decr at bases, otherwise CTA Abdomen: Obese, bs pos. , soft, nontender Extremities: Dialysis Access: no pedal edema,L IJ Perm cath non tender, no dc  Outpatient HD: (TTS at [facility])  3hr 45 min EDW 112kg Bath 2K/2.25Ca L IJ cath Profile 4 Heparin 8000 / 2000  EPO 1000 2x/wk Hectorol 2ug/hd  Ancef 2gm 8/23 > 9/6 (fever, +blood cx's for coag neg staph from 8/14, cath not removed)   Impression:  1. GI bleed with hematochezia- stable Hgb / GI evaluating/ no heparin hd for now 2. Recent coag neg staph bacteremia- Prob. Perm cath  Etiology (REFUSING permanent Access) on Ancef 2gm after HD tiw thru 9/6 ( HAS Received with prob. With ho pcn Allergy) catheter long-term as pt refuses perm access, will get f/u blood cx's to be sure infection cleared, cath has not been removed 3. ESRD, cont TTS HD(ash)  And no hep hd/ fu am labs 4. Hypotension / volume-( VOLUME OVERLOAD) 7 kg up by wts/ vascular congestion on CXR/ On midodrine 10mg  tid  for low BP's/ yesterday hd 4200 uf  With bp drop to 65/35 end of HD and BED wts= 119 prehd to 114.3 post hd (edw112kg)/ 97 % o2 stats 5. Anemia- hgb9.9  cont Aranesp in hosp. weekly 6. Sec HPT- cont vit D, sensipar and phoslo/ fu am labs 7. COPD/ OSA= refusing Cpap in hosp and at home/ on home O2/  8. Diabetic blindness 9. CAD, remote MI 10. CVA 2006, (numb left side is sequelae)  Lives with Wife at home.  Lenny Pastel, PA-C [facility] [phone] 08/31/2012,10:27 AM  LOS: 2 days   Patient  seen and examined.  I agree with assessment and plan as above with additions as indicated. Vinson Moselle  MD Pager [phone]    Cell  [phone] 08/31/2012, 4:08 PM

## 2012-08-31 NOTE — Progress Notes (Signed)
[facility] Progress Note  Subjective: Patient is lethargic denies any complaints denies any bowel movements suggest today.  Objective: Vital signs in last 24 hours: Temp:  [97.4 F (36.3 C)-98.6 F (37 C)] 98.6 F (37 C) (08/31 0700) Pulse Rate:  [63-88] 63 (08/31 0302) Resp:  [9-19] 18 (08/31 0302) BP: (65-142)/(35-82) 101/57 mmHg (08/31 0302) SpO2:  [97 %-100 %] 97 % (08/31 0302) Weight:  [114.3 kg (251 lb 15.8 oz)-119 kg (262 lb 5.6 oz)] 115.5 kg (254 lb 10.1 oz) (08/31 0302) Weight change: 1.6 kg (3 lb 8.4 oz)   PE: Rectal exam reveals no obvious external hemorrhoids. Digital exam reveals one small smear of mahogany mucus on examining gloveLab Results: Results for orders placed during the hospital encounter of 08/29/12 (from the past 24 hour(s))  GLUCOSE, CAPILLARY     Status: Abnormal   Collection Time    08/30/12 11:38 AM      Result Value Range   Glucose-Capillary 111 (*) 70 - 99 mg/dL   Comment 1 Documented in Chart     Comment 2 Notify RN    CBC     Status: Abnormal   Collection Time    08/30/12  3:18 PM      Result Value Range   WBC 13.0 (*) 4.0 - 10.5 K/uL   RBC 3.02 (*) 4.22 - 5.81 MIL/uL   Hemoglobin 9.4 (*) 13.0 - 17.0 g/dL   HCT 16.1 (*) 09.6 - 04.5 %   MCV 98.7  78.0 - 100.0 fL   MCH 31.1  26.0 - 34.0 pg   MCHC 31.5  30.0 - 36.0 g/dL   RDW 40.9  81.1 - 91.4 %   Platelets 223  150 - 400 K/uL  RENAL FUNCTION PANEL     Status: Abnormal   Collection Time    08/30/12  3:20 PM      Result Value Range   Sodium 137  135 - 145 mEq/L   Potassium 3.8  3.5 - 5.1 mEq/L   Chloride 97  96 - 112 mEq/L   CO2 29  19 - 32 mEq/L   Glucose, Bld 137 (*) 70 - 99 mg/dL   BUN 36 (*) 6 - 23 mg/dL   Creatinine, Ser 7.82 (*) 0.50 - 1.35 mg/dL   Calcium 9.4  8.4 - 95.6 mg/dL   Phosphorus 5.6 (*) 2.3 - 4.6 mg/dL   Albumin 2.9 (*) 3.5 - 5.2 g/dL   GFR calc non Af Amer 6 (*) >90 mL/min   GFR calc Af Amer 7 (*) >90 mL/min  GLUCOSE, CAPILLARY     Status: None   Collection Time    08/30/12  7:18 PM      Result Value Range   Glucose-Capillary 86  70 - 99 mg/dL   Comment 1 Notify RN    HEMOGLOBIN AND HEMATOCRIT, BLOOD     Status: Abnormal   Collection Time    08/30/12  7:49 PM      Result Value Range   Hemoglobin 11.2 (*) 13.0 - 17.0 g/dL   HCT 21.3 (*) 08.6 - 57.8 %  GLUCOSE, CAPILLARY     Status: Abnormal   Collection Time    08/30/12 11:19 PM      Result Value Range   Glucose-Capillary 194 (*) 70 - 99 mg/dL   Comment 1 Notify RN    HEMOGLOBIN AND HEMATOCRIT, BLOOD     Status: Abnormal   Collection Time    08/31/12  1:40 AM  Result Value Range   Hemoglobin 9.9 (*) 13.0 - 17.0 g/dL   HCT 16.1 (*) 09.6 - 04.5 %  GLUCOSE, CAPILLARY     Status: None   Collection Time    08/31/12  3:01 AM      Result Value Range   Glucose-Capillary 97  70 - 99 mg/dL   Comment 1 Notify RN    GLUCOSE, CAPILLARY     Status: Abnormal   Collection Time    08/31/12  8:11 AM      Result Value Range   Glucose-Capillary 45 (*) 70 - 99 mg/dL   Comment 1 Repeat Test     Comment 2 Notify RN    GLUCOSE, CAPILLARY     Status: Abnormal   Collection Time    08/31/12  8:14 AM      Result Value Range   Glucose-Capillary 50 (*) 70 - 99 mg/dL   Comment 1 Notify RN    GLUCOSE, CAPILLARY     Status: None   Collection Time    08/31/12  8:36 AM      Result Value Range   Glucose-Capillary 97  70 - 99 mg/dL   Comment 1 Documented in Chart     Comment 2 Notify RN    GLUCOSE, CAPILLARY     Status: Abnormal   Collection Time    08/31/12  9:25 AM      Result Value Range   Glucose-Capillary 139 (*) 70 - 99 mg/dL   Comment 1 Documented in Chart     Comment 2 Notify RN      Studies/Results: Dg Abd Acute W/chest  08/30/2012   CLINICAL DATA:  GI bleeding. End-stage renal disease. History of CHF, hypertension.  EXAM: ACUTE ABDOMEN SERIES (ABDOMEN 2 VIEW & CHEST 1 VIEW)  COMPARISON:  CHEST x-ray 05/07/2012  FINDINGS: Left dialysis catheter remains in stable position.  Cardiomegaly. Mild vascular congestion without overt edema. No effusions.  Nonobstructive bowel gas pattern. No free air organomegaly. No suspicious calcification.  IMPRESSION: No evidence of bowel obstruction or free air.  Cardiomegaly, vascular congestion   Electronically Signed   By: Charlett Nose   On: 08/30/2012 00:52      Assessment: Patient admitted with rectal bleeding. Seems minimal. Wife states the patient had a screening colonoscopy this year at the [facility] hospital.  Plan: Conservative therapy at least through the holiday weekend. If continues to bleed will consider sigmoidoscopy if the first of the week or since followup at the [facility] hospital if bleeding stops.    Lorrene Graef C 08/31/2012, 10:43 AM

## 2012-08-31 NOTE — Progress Notes (Signed)
Hypoglycemic Event  CBG: 50  Treatment: 15 GM carbohydrate snack and D50 IV 25 mL  Symptoms: Shaky  Follow-up CBG: [id number] CBG Result:97  Possible Reasons for Event: Medication Regimen  Comments/MD notified:Dr. York Spaniel notified via text page @[phone]     Danna Hefty  Remember to initiate Hypoglycemia Order Set & complete

## 2012-09-01 LAB — GLUCOSE, CAPILLARY: Glucose-Capillary: 97 mg/dL (ref 70–99)

## 2012-09-01 LAB — RENAL FUNCTION PANEL
Albumin: 3.2 g/dL — ABNORMAL LOW (ref 3.5–5.2)
BUN: 30 mg/dL — ABNORMAL HIGH (ref 6–23)
Calcium: 10.2 mg/dL (ref 8.4–10.5)
Creatinine, Ser: 7.84 mg/dL — ABNORMAL HIGH (ref 0.50–1.35)
Phosphorus: 6.9 mg/dL — ABNORMAL HIGH (ref 2.3–4.6)
Potassium: 4.7 mEq/L (ref 3.5–5.1)

## 2012-09-01 MED ORDER — CEFAZOLIN SODIUM-DEXTROSE 2-3 GM-% IV SOLR: 2.0000 g | INTRAVENOUS | Status: AC

## 2012-09-01 MED ORDER — RENA-VITE PO TABS
1.0000 | ORAL_TABLET | Freq: Every day | ORAL | Status: DC
  Filled 2012-09-01: qty 1

## 2012-09-01 MED ORDER — ASPIRIN 325 MG PO TABS: 325.0000 mg | ORAL_TABLET | Freq: Every day | ORAL | Status: DC

## 2012-09-01 NOTE — Progress Notes (Signed)
I have seen and examined this patient and agree with the plan of care. Large fluid gains. Appears puffy today. Will plan dialysis in AM.  [facility] W 09/01/2012, 11:43 AM

## 2012-09-01 NOTE — Progress Notes (Signed)
Subjective:   Feels ok. No BM in two days. Wife at bedside. No complaints  Objective Filed Vitals:   08/31/12 1626 08/31/12 2040 09/01/12 0502 09/01/12 0930  BP: 129/48 140/70 118/79 113/43  Pulse: 73 70 84 67  Temp: 98 F (36.7 C) 98.1 F (36.7 C) 98.7 F (37.1 C) 98.6 F (37 C)  TempSrc:  Oral Oral Oral  Resp: 18 18 17 18   Height:  6' (1.829 m)    Weight:  115.5 kg (254 lb 10.1 oz)    SpO2: 98% 99% 96% 98%   Physical Exam General: lethargic and mostly uncooperative.  Heart: RRR no murmur Lungs: Clear with diminished bases Abdomen: obese, soft, nontender. +BS Extremities: No LE edema Dialysis Access: L IJ cath  Outpatient HD: (TTS at [facility] HD)  3hr 45 min EDW 112kg Bath 2K/2.25Ca L IJ cath Profile 4 Heparin 8000 / 2000  EPO 1000 2x/wk Hectorol 2ug/hd  Ancef 2gm 8/23 > 9/6 (fever, +blood cx's for coag neg staph from 8/14, cath not removed)    Assessment/Plan: 1. GI bleed- GI following. No BM in 2 days. Hgb stable at 10. Per GI no obvious external hemorrhoids and one small smear of mahogany mucus on digital exam.  Conservative therapy for now. Sigmoidoscopy if bleeding continues. Cont to hold heparin in HD 2. Recent coag negative staph bacteremia- likely source perm cath- which as not been removed. Refusing permanent access. On ancef 2gm Q HD until 9/6. Then follow up blood cultures 2. ESRD - TTS at [facility]- HD pending for tomorrow. K+ 4.7 3. Anemia - hgb 10. Cont aranesp and epo 4. Secondary hyperparathyroidism - ca + 10.2. phos 6.9. Sensipar and phoslo with meals. Hectorol 5. HTN/volume -  113/43. On midodrine. Still 3.5 kg above EDW. No sob.  6. Nutrition - decreased appetite per wife. Renal diet. multivit 7. DM- CBG 81. aspart ss with meals and lantus 35 u BID 8. OSA- refuses CPAP, uses 02 at home  Jetty Duhamel, NP [facility] [phone] 09/01/2012,10:57 AM  LOS: 3 days    Additional Objective Labs: Basic Metabolic Panel:  Recent  Labs Lab 08/30/12 0315 08/30/12 1520 09/01/12 0430  NA 139 137 135  K 3.6 3.8 4.7  CL 98 97 96  CO2 29 29 26   GLUCOSE 127* 137* 96  BUN 33* 36* 30*  CREATININE 7.49* 8.23* 7.84*  CALCIUM 9.4 9.4 10.2  PHOS  --  5.6* 6.9*   Liver Function Tests:  Recent Labs Lab 08/30/12 1520 09/01/12 0430  ALBUMIN 2.9* 3.2*   No results found for this basename: LIPASE, AMYLASE,  in the last 168 hours CBC:  Recent Labs Lab 08/29/12 2326  08/30/12 0315  08/30/12 1518  08/31/12 0140 08/31/12 1120 08/31/12 1900  WBC 15.9*  --  14.9*  --  13.0*  --   --   --   --   HGB 10.6*  < > 11.0*  < > 9.4*  < > 9.9* 9.7* 10.0*  HCT 33.5*  < > 33.8*  < > 29.8*  < > 31.5* 30.9* 30.9*  MCV 97.7  --  97.4  --  98.7  --   --   --   --   PLT 224  --  205  --  223  --   --   --   --   < > = values in this interval not displayed. Blood Culture    Component Value Date/Time   SDES BLOOD RIGHT  ANTECUBITAL 05/08/2012 1540   SPECREQUEST BOTTLES DRAWN AEROBIC ONLY 10CC 05/08/2012 1540   CULT NO GROWTH 5 DAYS 05/08/2012 1540   REPTSTATUS 05/15/2012 FINAL 05/08/2012 1540    Cardiac Enzymes: No results found for this basename: CKTOTAL, CKMB, CKMBINDEX, TROPONINI,  in the last 168 hours CBG:  Recent Labs Lab 08/31/12 1700 08/31/12 2044 08/31/12 2354 09/01/12 0345 09/01/12 0811  GLUCAP 162* 115* 107* 97 81   Iron Studies: No results found for this basename: IRON, TIBC, TRANSFERRIN, FERRITIN,  in the last 72 hours @lablastinr3 @ Studies/Results: No results found. Medications:   . artificial tears  1 application Right Eye BID  . b complex-vitamin c-folic acid  1 tablet Oral Daily  . brimonidine  1 drop Both Eyes BID  . calcium acetate  2,001 mg Oral TID WC  . [START ON 09/02/2012]  ceFAZolin (ANCEF) IV  2 g Intravenous Q T,Th,Sa-HD  . cinacalcet  30 mg Oral Q breakfast  . [START ON 09/04/2012] darbepoetin (ARANESP) injection - DIALYSIS  100 mcg Intravenous Q Thu-HD  . insulin aspart  0-9 Units Subcutaneous  Q4H  . insulin glargine  35 Units Subcutaneous BID  . midodrine  10 mg Oral TID WC  . pantoprazole (PROTONIX) IV  40 mg Intravenous Q24H  . polyvinyl alcohol  1 drop Both Eyes BID  . rOPINIRole  0.25 mg Oral Custom  . rOPINIRole  0.5 mg Oral QHS  . simvastatin  20 mg Oral q1800  . cyanocobalamin  1,000 mcg Oral Daily

## 2012-09-01 NOTE — Discharge Summary (Signed)
Physician Discharge Summary  [name]:[id number] DOB: 07/21/1945 DOA: 08/29/2012  PCP: [name]., MD  Admit date: 08/29/2012 Discharge date: 09/01/2012  Time spent: > 25 minutes  minutes  Recommendations for Outpatient Follow-up:   Follow up with PCP in 1 week for CBC Follow up with HD as scheduled   Discharge Diagnoses:  Principal Problem:   GI bleeding Active Problems:   ESRD (end stage renal disease) on dialysis   Anemia   Diabetes mellitus   CAD (coronary artery disease)   Discharge Condition: stable   Diet recommendation: renal   Filed Weights   08/30/12 1827 08/31/12 0302 08/31/12 2040  Weight: 114.3 kg (251 lb 15.8 oz) 115.5 kg (254 lb 10.1 oz) 115.5 kg (254 lb 10.1 oz)    History of present illness: 67 y.o. male with history of ESRD on HD, DM@, and CD who presents to the ED with complaints of passing bloody stools all day. He denies having ABD pain or nausea or vomiting or fevers or chills. He had an extra dialysis treatment today but was unable To complete the session. On IV Vancomycin with dialysis due to recent bacteremia; and had to have an additional session on Friday due to volume overload. His ususal dialysis schedule is on Tues Thursday and Saturdays.    Hospital Course:  Assessment/Plan:   1. GIB, +occult blood; hemodynamically stable; Hg remains stable; ? Perianal bleeding per GI who recommended to f/u at [facility] hospital;  -Aspirin on hold for 1 week 2. ESRD on HD- discussed with nephrologist for HD, appreciate comanagement  - Recent coag neg staph bacteremia- on Ancef 2gm after HD tiw thru 9/6 per nephrology  -d/w nephrology okay for d/c to f/u at HD center as scheduled  3. Anemia due to #1 and #2, monitor H/Hs.; on epo  4. DM2- HA1C 7.0 8/14);cont lantus 35 units BID; (26 units with each meal)  5. CAD- stable; ECG 1st degree av block with sinus arrythmia; no chest pain, monitor  -h/o CVA L sided paraesthesia  6. OSA on CPAP; on home oxygen; con  O2, refusing CPAP 8. Hypotension; takes midodrine for low BP's; intermittent episodes yesterday; resolved       Procedures: CXR   Consultations:  Dr. Madilyn Fireman  Dr. Arlean Hopping   Discharge Exam: Filed Vitals:   09/01/12 0930  BP: 113/43  Pulse: 67  Temp: 98.6 F (37 C)  Resp: 18    General: alert, oriented  Cardiovascular: S1, S2 RRR Respiratory: CTA bl   Discharge Instructions  Discharge Orders   Future Orders Complete By Expires   Diet - low sodium heart healthy  As directed    Discharge instructions  As directed    Comments:     1. Please follow up at [facility] in 1 week for repeat blood test to check hemoglobin 2. Please follow up at dialysis center as scheduled   Increase activity slowly  As directed        Medication List    STOP taking these medications       VANCOMYCIN HCL IV      TAKE these medications       artificial tears Oint ophthalmic ointment  Place 1 application into the right eye 2 (two) times daily.     aspirin 325 MG tablet  Take 1 tablet (325 mg total) by mouth daily.  Start taking on:  09/08/2012     brimonidine 0.2 % ophthalmic solution  Commonly known as:  ALPHAGAN  Place  1 drop into both eyes 2 (two) times daily.     calcium acetate 667 MG capsule  Commonly known as:  PHOSLO  Take 4 capsules (2,668 mg total) by mouth 3 (three) times daily with meals.     ceFAZolin 2-3 GM-% Solr  Commonly known as:  ANCEF  Inject 50 mLs (2 g total) into the vein Every Tuesday,Thursday,and Saturday with dialysis.  Start taking on:  09/02/2012     cinacalcet 30 MG tablet  Commonly known as:  SENSIPAR  Take 30 mg by mouth daily.     cyanocobalamin 500 MCG tablet  Take 1,000 mcg by mouth daily.     folic acid-vitamin b complex-vitamin c-selenium-zinc 3 MG Tabs tablet  Take 1 tablet by mouth daily.     gabapentin 100 MG capsule  Commonly known as:  NEURONTIN  Take 300 mg by mouth at bedtime as needed (nerve pain).     insulin aspart 100 UNIT/ML  injection  Commonly known as:  novoLOG  Inject 26 Units into the skin 3 (three) times daily with meals.     insulin glargine 100 UNIT/ML injection  Commonly known as:  LANTUS  Inject 35 Units into the skin 2 (two) times daily.     midodrine 10 MG tablet  Commonly known as:  PROAMATINE  Take 1 tablet (10 mg total) by mouth 3 (three) times daily.     omeprazole 20 MG capsule  Commonly known as:  PRILOSEC  Take 20 mg by mouth 2 (two) times daily.     oxycodone 5 MG capsule  Commonly known as:  OXY-IR  Take 10 mg by mouth every 6 (six) hours as needed for pain.     pravastatin 40 MG tablet  Commonly known as:  PRAVACHOL  Take 40 mg by mouth at bedtime.     REFRESH CELLUVISC 1 % ophthalmic solution  Generic drug:  carboxymethylcellulose  Place 1 drop into both eyes every 2 (two) hours.     rOPINIRole 0.25 MG tablet  Commonly known as:  REQUIP  Take 0.25-0.5 mg by mouth 3 (three) times daily. 1 tablet twice a day and 2 tablets at bedtime.     sennosides-docusate sodium 8.6-50 MG tablet  Commonly known as:  SENOKOT-S  Take 1 tablet by mouth 2 (two) times daily as needed for constipation. For constipation       Allergies  Allergen Reactions  . Cefepime Itching  . Penicillins Hives and Swelling       Follow-up Information   Follow up with [name]., MD as scheduled for HD.   Specialty:  Nephrology   Contact information:   [address] [facility] [place] [address] [phone]        The results of significant diagnostics from this hospitalization (including imaging, microbiology, ancillary and laboratory) are listed below for reference.    Significant Diagnostic Studies: Dg Abd Acute W/chest  08/30/2012   CLINICAL DATA:  GI bleeding. End-stage renal disease. History of CHF, hypertension.  EXAM: ACUTE ABDOMEN SERIES (ABDOMEN 2 VIEW & CHEST 1 VIEW)  COMPARISON:  CHEST x-ray 05/07/2012  FINDINGS: Left dialysis catheter remains in stable position.  Cardiomegaly. Mild vascular congestion without overt edema. No effusions.  Nonobstructive bowel gas pattern. No free air organomegaly. No suspicious calcification.  IMPRESSION: No evidence of bowel obstruction or free air.  Cardiomegaly, vascular congestion   Electronically Signed   By: Charlett Nose   On: 08/30/2012 00:52    Microbiology: Recent Results (  from the past 240 hour(s))  MRSA PCR SCREENING     Status: None   Collection Time    08/30/12  4:33 AM      Result Value Range Status   MRSA by PCR NEGATIVE  NEGATIVE Final   Comment:            The GeneXpert MRSA Assay (FDA     approved for NASAL specimens     only), is one component of a     comprehensive MRSA colonization     surveillance program. It is not     intended to diagnose MRSA     infection nor to guide or     monitor treatment for     MRSA infections.     Labs: Basic Metabolic Panel:  Recent Labs Lab 08/29/12 2344 08/30/12 0315 08/30/12 1520 09/01/12 0430  NA 139 139 137 135  K 3.7 3.6 3.8 4.7  CL 98 98 97 96  CO2  --  29 29 26   GLUCOSE 130* 127* 137* 96  BUN 32* 33* 36* 30*  CREATININE 7.40* 7.49* 8.23* 7.84*  CALCIUM  --  9.4 9.4 10.2  PHOS  --   --  5.6* 6.9*   Liver Function Tests:  Recent Labs Lab 08/30/12 1520 09/01/12 0430  ALBUMIN 2.9* 3.2*   No results found for this basename: LIPASE, AMYLASE,  in the last 168 hours No results found for this basename: AMMONIA,  in the last 168 hours CBC:  Recent Labs Lab 08/29/12 2326  08/30/12 0315  08/30/12 1518 08/30/12 1949 08/31/12 0140 08/31/12 1120 08/31/12 1900  WBC 15.9*  --  14.9*  --  13.0*  --   --   --   --   HGB 10.6*  < > 11.0*  < > 9.4* 11.2* 9.9* 9.7* 10.0*  HCT 33.5*  < > 33.8*  < > 29.8* 34.8* 31.5* 30.9* 30.9*  MCV 97.7  --  97.4  --  98.7  --   --   --   --   PLT 224  --  205  --  223  --   --   --   --   < > = values in this interval not displayed. Cardiac Enzymes: No results found for this basename: CKTOTAL, CKMB,  CKMBINDEX, TROPONINI,  in the last 168 hours BNP: BNP (last 3 results) No results found for this basename: PROBNP,  in the last 8760 hours CBG:  Recent Labs Lab 08/31/12 2044 08/31/12 2354 09/01/12 0345 09/01/12 0811 09/01/12 1126  GLUCAP 115* 107* 97 81 89       Signed:  Shaunice Levitan N  [facility] 09/01/2012, 12:07 PM

## 2012-09-01 NOTE — Progress Notes (Signed)
Discharge instructions given to patient and wife.  All questions answered.  Tele box discontinued and IV removed.  Assessment unchanged from morning.  Patient wheeled down in wheelchair to car by security.

## 2012-09-01 NOTE — Progress Notes (Signed)
[facility] Progress Note  Subjective: No stools charted last 2 days. Rectal exam yesterday revealed one small mahogany smear  Objective: Vital signs in last 24 hours: Temp:  [97.8 F (36.6 C)-99 F (37.2 C)] 98.6 F (37 C) (09/01 0930) Pulse Rate:  [66-84] 67 (09/01 0930) Resp:  [14-18] 18 (09/01 0930) BP: (113-140)/(28-79) 113/43 mmHg (09/01 0930) SpO2:  [96 %-100 %] 98 % (09/01 0930) Weight:  [115.5 kg (254 lb 10.1 oz)] 115.5 kg (254 lb 10.1 oz) (08/31 2040) Weight change: -3.5 kg (-7 lb 11.5 oz)   PE: Lethargic and unchanged  Lab Results: Results for orders placed during the hospital encounter of 08/29/12 (from the past 24 hour(s))  HEMOGLOBIN AND HEMATOCRIT, BLOOD     Status: Abnormal   Collection Time    08/31/12 11:20 AM      Result Value Range   Hemoglobin 9.7 (*) 13.0 - 17.0 g/dL   HCT 40.9 (*) 81.1 - 91.4 %  GLUCOSE, CAPILLARY     Status: Abnormal   Collection Time    08/31/12 11:23 AM      Result Value Range   Glucose-Capillary 150 (*) 70 - 99 mg/dL   Comment 1 Documented in Chart     Comment 2 Notify RN    GLUCOSE, CAPILLARY     Status: Abnormal   Collection Time    08/31/12  4:26 PM      Result Value Range   Glucose-Capillary 153 (*) 70 - 99 mg/dL  GLUCOSE, CAPILLARY     Status: Abnormal   Collection Time    08/31/12  5:00 PM      Result Value Range   Glucose-Capillary 162 (*) 70 - 99 mg/dL  HEMOGLOBIN AND HEMATOCRIT, BLOOD     Status: Abnormal   Collection Time    08/31/12  7:00 PM      Result Value Range   Hemoglobin 10.0 (*) 13.0 - 17.0 g/dL   HCT 78.2 (*) 95.6 - 21.3 %  GLUCOSE, CAPILLARY     Status: Abnormal   Collection Time    08/31/12  8:44 PM      Result Value Range   Glucose-Capillary 115 (*) 70 - 99 mg/dL  GLUCOSE, CAPILLARY     Status: Abnormal   Collection Time    08/31/12 11:54 PM      Result Value Range   Glucose-Capillary 107 (*) 70 - 99 mg/dL  GLUCOSE, CAPILLARY     Status: None   Collection Time    09/01/12   3:45 AM      Result Value Range   Glucose-Capillary 97  70 - 99 mg/dL  RENAL FUNCTION PANEL     Status: Abnormal   Collection Time    09/01/12  4:30 AM      Result Value Range   Sodium 135  135 - 145 mEq/L   Potassium 4.7  3.5 - 5.1 mEq/L   Chloride 96  96 - 112 mEq/L   CO2 26  19 - 32 mEq/L   Glucose, Bld 96  70 - 99 mg/dL   BUN 30 (*) 6 - 23 mg/dL   Creatinine, Ser 0.86 (*) 0.50 - 1.35 mg/dL   Calcium 57.8  8.4 - 46.9 mg/dL   Phosphorus 6.9 (*) 2.3 - 4.6 mg/dL   Albumin 3.2 (*) 3.5 - 5.2 g/dL   GFR calc non Af Amer 6 (*) >90 mL/min   GFR calc Af Amer 7 (*) >90 mL/min  GLUCOSE, CAPILLARY  Status: None   Collection Time    09/01/12  8:11 AM      Result Value Range   Glucose-Capillary 81  70 - 99 mg/dL    Studies/Results: No results found.    Assessment: Rectal bleeding probably distal source probably perianal with reported last colonoscopy within the last year at the [facility] hospital and [facility] Plan: Favor conservative management and followup at the [place]. Will follow at distance. Least call if further GI input needed    Patton Swisher C 09/01/2012, 9:59 AM

## 2012-09-01 NOTE — Progress Notes (Addendum)
Pt's IV removed. Tele removed. When nursing staff attempted to stand pt to pull up his pants, pt was unable to stand. Pt extremely weak. According to the wife, pt has been like this for 6 months, but pt is very hard headed and refuses therapy. Pt is completely oriented. Pt's wife struggles with him every time he has to go to dialysis, to get him out of bed, and into the wheelchair for transport to HD. Pt is adamant on going home via the wheelchair, and refuses ambulance transport home. Charge nurse notified. We both discussed this with pt, and he adamantly refuses to take an ambulance home, even though this seems to be the safest option, because he is concerned that he will have to pay for the ambulance ride home. We discussed this with social work, and they are unable to guarantee that insurance will completely cover the fee. Security then notified for assistance, and they were successful in getting the pt from bed to wheelchair, and from wheelchair to vehicle. Wife states she has neighbors that can help get him from the vehicle, back into the house. I worry about the pt's safety, and so does the wife, but the pt is capable of making his own decisions, and is firm in his decision to go home.

## 2012-09-08 LAB — CULTURE, BLOOD (ROUTINE X 2)

## 2012-09-25 ENCOUNTER — Emergency Department (HOSPITAL_COMMUNITY): Payer: Medicare Other

## 2012-09-25 ENCOUNTER — Encounter (HOSPITAL_COMMUNITY): Payer: Self-pay | Admitting: Emergency Medicine

## 2012-09-25 ENCOUNTER — Emergency Department (HOSPITAL_COMMUNITY)
Admission: EM | Admit: 2012-09-25 | Discharge: 2012-09-25 | Disposition: A | Discharge status: Home / Self Care | Payer: Medicare Other | Attending: Emergency Medicine | Admitting: Emergency Medicine

## 2012-09-25 DIAGNOSIS — H543 Unqualified visual loss, both eyes: Secondary | ICD-10-CM | POA: Insufficient documentation

## 2012-09-25 DIAGNOSIS — E1139 Type 2 diabetes mellitus with other diabetic ophthalmic complication: Secondary | ICD-10-CM | POA: Insufficient documentation

## 2012-09-25 DIAGNOSIS — J45909 Unspecified asthma, uncomplicated: Secondary | ICD-10-CM | POA: Insufficient documentation

## 2012-09-25 DIAGNOSIS — E785 Hyperlipidemia, unspecified: Secondary | ICD-10-CM | POA: Insufficient documentation

## 2012-09-25 DIAGNOSIS — R42 Dizziness and giddiness: Secondary | ICD-10-CM | POA: Insufficient documentation

## 2012-09-25 DIAGNOSIS — I252 Old myocardial infarction: Secondary | ICD-10-CM | POA: Insufficient documentation

## 2012-09-25 DIAGNOSIS — J4489 Other specified chronic obstructive pulmonary disease: Secondary | ICD-10-CM | POA: Insufficient documentation

## 2012-09-25 DIAGNOSIS — I120 Hypertensive chronic kidney disease with stage 5 chronic kidney disease or end stage renal disease: Secondary | ICD-10-CM | POA: Insufficient documentation

## 2012-09-25 DIAGNOSIS — R000 Tachycardia, unspecified: Secondary | ICD-10-CM | POA: Insufficient documentation

## 2012-09-25 DIAGNOSIS — N186 End stage renal disease: Secondary | ICD-10-CM | POA: Insufficient documentation

## 2012-09-25 DIAGNOSIS — Z888 Allergy status to other drugs, medicaments and biological substances status: Secondary | ICD-10-CM | POA: Insufficient documentation

## 2012-09-25 DIAGNOSIS — I959 Hypotension, unspecified: Secondary | ICD-10-CM | POA: Insufficient documentation

## 2012-09-25 DIAGNOSIS — I509 Heart failure, unspecified: Secondary | ICD-10-CM | POA: Insufficient documentation

## 2012-09-25 DIAGNOSIS — Z992 Dependence on renal dialysis: Secondary | ICD-10-CM | POA: Insufficient documentation

## 2012-09-25 DIAGNOSIS — Z8673 Personal history of transient ischemic attack (TIA), and cerebral infarction without residual deficits: Secondary | ICD-10-CM | POA: Insufficient documentation

## 2012-09-25 DIAGNOSIS — J449 Chronic obstructive pulmonary disease, unspecified: Secondary | ICD-10-CM | POA: Insufficient documentation

## 2012-09-25 DIAGNOSIS — Z880 Allergy status to penicillin: Secondary | ICD-10-CM | POA: Insufficient documentation

## 2012-09-25 DIAGNOSIS — Z79899 Other long term (current) drug therapy: Secondary | ICD-10-CM | POA: Insufficient documentation

## 2012-09-25 DIAGNOSIS — I2510 Atherosclerotic heart disease of native coronary artery without angina pectoris: Secondary | ICD-10-CM | POA: Insufficient documentation

## 2012-09-25 DIAGNOSIS — I440 Atrioventricular block, first degree: Secondary | ICD-10-CM | POA: Insufficient documentation

## 2012-09-25 DIAGNOSIS — Z87891 Personal history of nicotine dependence: Secondary | ICD-10-CM | POA: Insufficient documentation

## 2012-09-25 DIAGNOSIS — Z794 Long term (current) use of insulin: Secondary | ICD-10-CM | POA: Insufficient documentation

## 2012-09-25 DIAGNOSIS — Z8709 Personal history of other diseases of the respiratory system: Secondary | ICD-10-CM | POA: Insufficient documentation

## 2012-09-25 DIAGNOSIS — K219 Gastro-esophageal reflux disease without esophagitis: Secondary | ICD-10-CM | POA: Insufficient documentation

## 2012-09-25 DIAGNOSIS — E860 Dehydration: Secondary | ICD-10-CM

## 2012-09-25 DIAGNOSIS — Z8701 Personal history of pneumonia (recurrent): Secondary | ICD-10-CM | POA: Insufficient documentation

## 2012-09-25 DIAGNOSIS — Z9981 Dependence on supplemental oxygen: Secondary | ICD-10-CM | POA: Insufficient documentation

## 2012-09-25 DIAGNOSIS — Z7982 Long term (current) use of aspirin: Secondary | ICD-10-CM | POA: Insufficient documentation

## 2012-09-25 LAB — CBC WITH DIFFERENTIAL/PLATELET
Basophils Relative: 1 % (ref 0–1)
HCT: 32.5 % — ABNORMAL LOW (ref 39.0–52.0)
Hemoglobin: 10.5 g/dL — ABNORMAL LOW (ref 13.0–17.0)
Lymphs Abs: 1.2 10*3/uL (ref 0.7–4.0)
MCH: 31.8 pg (ref 26.0–34.0)
MCHC: 32.3 g/dL (ref 30.0–36.0)
Monocytes Absolute: 0.9 10*3/uL (ref 0.1–1.0)
Monocytes Relative: 8 % (ref 3–12)
Neutro Abs: 9 10*3/uL — ABNORMAL HIGH (ref 1.7–7.7)
RBC: 3.3 MIL/uL — ABNORMAL LOW (ref 4.22–5.81)

## 2012-09-25 LAB — BASIC METABOLIC PANEL
BUN: 35 mg/dL — ABNORMAL HIGH (ref 6–23)
Chloride: 96 mEq/L (ref 96–112)
Creatinine, Ser: 8.46 mg/dL — ABNORMAL HIGH (ref 0.50–1.35)
GFR calc Af Amer: 7 mL/min — ABNORMAL LOW (ref >90)
Glucose, Bld: 182 mg/dL — ABNORMAL HIGH (ref 70–99)

## 2012-09-25 MED ORDER — SODIUM CHLORIDE 0.9 % IV BOLUS (SEPSIS)
250.0000 mL | Freq: Once | INTRAVENOUS | Status: AC
  Administered 2012-09-25: 250 mL via INTRAVENOUS

## 2012-09-25 NOTE — ED Provider Notes (Addendum)
CSN: [id number]     Arrival date & time 09/25/12  1111 History   First MD Initiated Contact with Patient 09/25/12 1148     Chief Complaint  Patient presents with  . Loss of Consciousness    HPI  , Was at dialysis today. He has been getting his normal Tuesday Thursday Saturday dialysis. He states that he was asleep" they had to shake him he to wake me up". He denies that he was unconscious. He states he awakened easily. He had a low blood pressure reading following this. He just finished his dialysis. He was transferred here by ambulance. He really doesn't want to be here.  He denies shortness of breath. He denies fever. Here he denies nausea or vomiting. He denies any pain in his chest. He denies any symptoms whatsoever.   Past Medical History  Diagnosis Date  . CHF (congestive heart failure)   . CAD (coronary artery disease) 2006  . COPD (chronic obstructive pulmonary disease)   . Hyperlipidemia   . ESRD (end stage renal disease) on dialysis started 08/2006    MTTS [place]; "getting ready to have it on TTS" (05/07/2012)  . Type II diabetes mellitus     etiology of ESRD  . Diabetic blindness   . Hypertension   . MI, old 2006  . Asthma   . Pneumonia     "everytime I take a cold" (05/07/2012)  . Bronchiectasis   . Shortness of breath     "can happen at anytime" (05/07/2012)  . GERD (gastroesophageal reflux disease)   . Stroke 2006    "numb on left side since"  05/07/2012  . On home oxygen therapy     "3L at night mostly; also prn" (05/07/2012)   Past Surgical History  Procedure Laterality Date  . Av fistula placement Right 09/2006    "used it once" (05/07/2012)  . Appendectomy  ~ 1958  . Insertion of dialysis catheter Left 09/2006    "chest" (05/07/2012)  . Total hip arthroplasty Left 1997  . Repair ankle ligament Left 1981  . Cataract extraction w/ intraocular lens implant Left ?2012   Family History  Problem Relation Age of Onset  . Diabetes Mellitus II Father    History   Substance Use Topics  . Smoking status: Former Smoker -- 2.00 packs/day for 30 years    Types: Cigarettes    Quit date: 01/02/1984  . Smokeless tobacco: Never Used  . Alcohol Use: Yes     Comment: 05/07/2012 "stopped drinking 1979"    Review of Systems  Constitutional: Negative for fever, chills, diaphoresis, appetite change and fatigue.  HENT: Negative for sore throat, mouth sores and trouble swallowing.   Eyes: Negative for visual disturbance.  Respiratory: Negative for cough, chest tightness, shortness of breath and wheezing.   Cardiovascular: Negative for chest pain.  Gastrointestinal: Negative for nausea, vomiting, abdominal pain, diarrhea and abdominal distention.  Endocrine: Negative for polydipsia, polyphagia and polyuria.  Genitourinary: Negative for dysuria, frequency and hematuria.  Musculoskeletal: Negative for gait problem.  Skin: Negative for color change, pallor and rash.  Neurological: Negative for dizziness, syncope, light-headedness and headaches.  Hematological: Does not bruise/bleed easily.  Psychiatric/Behavioral: Negative for behavioral problems and confusion.    Allergies  Cefepime and Penicillins  Home Medications   Current Outpatient Rx  Name  Route  Sig  Dispense  Refill  . artificial tears (LACRILUBE) OINT ophthalmic ointment   Right Eye   Place 1 application into the right eye 2 (  two) times daily.         Marland Kitchen aspirin 325 MG tablet   Oral   Take 1 tablet (325 mg total) by mouth daily.         . brimonidine (ALPHAGAN) 0.2 % ophthalmic solution   Both Eyes   Place 1 drop into both eyes 2 (two) times daily.         . calcium acetate (PHOSLO) 667 MG capsule   Oral   Take 4 capsules (2,668 mg total) by mouth 3 (three) times daily with meals.   360 capsule   0   . carboxymethylcellulose (REFRESH CELLUVISC) 1 % ophthalmic solution   Both Eyes   Place 1 drop into both eyes every 2 (two) hours.         . cinacalcet (SENSIPAR) 30 MG  tablet   Oral   Take 30 mg by mouth daily.         . cyanocobalamin 500 MCG tablet   Oral   Take 1,000 mcg by mouth daily.         . folic acid-vitamin b complex-vitamin c-selenium-zinc (DIALYVITE) 3 MG TABS   Oral   Take 1 tablet by mouth daily.         Marland Kitchen gabapentin (NEURONTIN) 100 MG capsule   Oral   Take 300 mg by mouth at bedtime as needed (nerve pain).         . insulin aspart (NOVOLOG) 100 UNIT/ML injection   Subcutaneous   Inject 26 Units into the skin 3 (three) times daily with meals.         . insulin glargine (LANTUS) 100 UNIT/ML injection   Subcutaneous   Inject 35 Units into the skin 2 (two) times daily.         . midodrine (PROAMATINE) 10 MG tablet   Oral   Take 1 tablet (10 mg total) by mouth 3 (three) times daily.   30 tablet   0   . omeprazole (PRILOSEC) 20 MG capsule   Oral   Take 20 mg by mouth 2 (two) times daily.          Marland Kitchen oxycodone (OXY-IR) 5 MG capsule   Oral   Take 10 mg by mouth every 6 (six) hours as needed for pain.          . pravastatin (PRAVACHOL) 40 MG tablet   Oral   Take 40 mg by mouth at bedtime.          Marland Kitchen rOPINIRole (REQUIP) 0.25 MG tablet   Oral   Take 0.25-0.5 mg by mouth 3 (three) times daily. 1 tablet twice a day and 2 tablets at bedtime.         . sennosides-docusate sodium (SENOKOT-S) 8.6-50 MG tablet   Oral   Take 1 tablet by mouth 2 (two) times daily as needed for constipation. For constipation          BP 78/38  Pulse 107  Temp(Src) 98.2 F (36.8 C) (Oral)  Resp 19  SpO2 100% Physical Exam  Constitutional: He is oriented to person, place, and time. No distress.  He is laying on his left side. He awakens to voice. He is cooperative with the exam. He seems rather irritated by the fact that he is in the emergency room but he does comply with exam and questioning and test.  HENT:  Head: Normocephalic.  Eyes: Conjunctivae are normal. Pupils are equal, round, and reactive to light. No scleral  icterus.  Neck:  Normal range of motion. Neck supple. No thyromegaly present.  Cardiovascular: Normal rate and regular rhythm.  Exam reveals no gallop and no friction rub.   No murmur heard. He is in sinus rhythm. When he sits upright he does have some orthostatic changes. He will occasionally change to a junctional tachycardia (versus sinus rhythm with marked first degree AV block). Laying supine he is clearly sinus.  Pulmonary/Chest: Effort normal and breath sounds normal. No respiratory distress. He has no wheezes. He has no rales.  Lungs are clear to auscultation without crackles or rales  Abdominal: Soft. Bowel sounds are normal. He exhibits no distension. There is no tenderness. There is no rebound.  Musculoskeletal: Normal range of motion.  Lymphadenopathy:  No edema  Neurological: He is alert and oriented to person, place, and time.  Skin: Skin is warm and dry. No rash noted.  Psychiatric: He has a normal mood and affect. His behavior is normal.    ED Course  Procedures (including critical care time) Labs Review  TEG: Indication tachycardia dizziness. Interpretation rhythm is junctional tachycardia versus sinus tach with first degree AV block. No ectopy. He is regular. His most recent comparison dated 08/30/2012 shows a sinus bradycardia with a first degree AV block and PR interval of 0.302.  Labs Reviewed  CBC WITH DIFFERENTIAL - Abnormal; Notable for the following:    WBC 11.4 (*)    RBC 3.30 (*)    Hemoglobin 10.5 (*)    HCT 32.5 (*)    Neutrophils Relative % 79 (*)    Neutro Abs 9.0 (*)    Lymphocytes Relative 11 (*)    All other components within normal limits  BASIC METABOLIC PANEL - Abnormal; Notable for the following:    Glucose, Bld 182 (*)    BUN 35 (*)    Creatinine, Ser 8.46 (*)    GFR calc non Af Amer 6 (*)    GFR calc Af Amer 7 (*)    All other components within normal limits   Imaging Review Dg Chest Port 1 View  09/25/2012   CLINICAL DATA:  Weakness.   EXAM: PORTABLE CHEST - 1 VIEW  COMPARISON:  08/30/2012.  FINDINGS: The left IJ a dialysis catheter is stable. Is stable prominent mediastinal and hilar contours stable calcified structure near the aortic arch. The lungs are clear. No pleural effusion or pulmonary edema. No pneumothorax.  IMPRESSION: Stable cardiomediastinal contours.  No acute pulmonary findings.   Electronically Signed   By: Loralie Champagne M.D.   On: 09/25/2012 12:51    MDM   1. Dizziness   2. Dehydration   3. Hypotension    Evaluation here is essentially unrevealing. He is asymptomatic. He does have drop his blood pressure 85-90 systolic. He just did finish dialysis. A repeat examined him and asked her repeat vitals after 2 hours and he is equilibrating somewhat. I did ask that he be given 250 cc of IV normal saline. His  numbers are improving.  He is less orthostatic. No hypotensive or tachycardic response.    Roney Marion, MD 09/25/12 1434  Roney Marion, MD 09/25/12 [phone]

## 2012-09-25 NOTE — ED Notes (Signed)
Pt to ED with c/o syncopal episode at dialysis. Per EMS, pt passed out for about a minutes. Pt finished dialysis. Pt states, had chest pain yesterday but denies now and he is not been feeling x1 week. CBG-136, HR-120, RR-26, SpO-98% on 3L. Pt go to dialysis on Tuesday, Thursday, and Saturday.

## 2013-04-13 ENCOUNTER — Emergency Department (HOSPITAL_COMMUNITY): Payer: Medicare Other

## 2013-04-13 ENCOUNTER — Inpatient Hospital Stay (HOSPITAL_COMMUNITY)
Admission: EM | Admit: 2013-04-13 | Discharge: 2013-04-22 | DRG: 917 | Disposition: A | Discharge status: Skilled Nursing Facility | Payer: Medicare Other | Attending: Internal Medicine | Admitting: Internal Medicine

## 2013-04-13 ENCOUNTER — Encounter (HOSPITAL_COMMUNITY): Payer: Self-pay | Admitting: Emergency Medicine

## 2013-04-13 DIAGNOSIS — R5381 Other malaise: Secondary | ICD-10-CM | POA: Diagnosis present

## 2013-04-13 DIAGNOSIS — I10 Essential (primary) hypertension: Secondary | ICD-10-CM

## 2013-04-13 DIAGNOSIS — I440 Atrioventricular block, first degree: Secondary | ICD-10-CM

## 2013-04-13 DIAGNOSIS — Z9119 Patient's noncompliance with other medical treatment and regimen: Secondary | ICD-10-CM

## 2013-04-13 DIAGNOSIS — B957 Other staphylococcus as the cause of diseases classified elsewhere: Secondary | ICD-10-CM | POA: Diagnosis not present

## 2013-04-13 DIAGNOSIS — R410 Disorientation, unspecified: Secondary | ICD-10-CM

## 2013-04-13 DIAGNOSIS — I120 Hypertensive chronic kidney disease with stage 5 chronic kidney disease or end stage renal disease: Secondary | ICD-10-CM | POA: Diagnosis present

## 2013-04-13 DIAGNOSIS — D631 Anemia in chronic kidney disease: Secondary | ICD-10-CM | POA: Diagnosis present

## 2013-04-13 DIAGNOSIS — I4510 Unspecified right bundle-branch block: Secondary | ICD-10-CM

## 2013-04-13 DIAGNOSIS — Z91199 Patient's noncompliance with other medical treatment and regimen due to unspecified reason: Secondary | ICD-10-CM

## 2013-04-13 DIAGNOSIS — J449 Chronic obstructive pulmonary disease, unspecified: Secondary | ICD-10-CM

## 2013-04-13 DIAGNOSIS — I2510 Atherosclerotic heart disease of native coronary artery without angina pectoris: Secondary | ICD-10-CM

## 2013-04-13 DIAGNOSIS — F05 Delirium due to known physiological condition: Secondary | ICD-10-CM | POA: Diagnosis not present

## 2013-04-13 DIAGNOSIS — I959 Hypotension, unspecified: Secondary | ICD-10-CM

## 2013-04-13 DIAGNOSIS — G9340 Encephalopathy, unspecified: Secondary | ICD-10-CM | POA: Diagnosis present

## 2013-04-13 DIAGNOSIS — Z833 Family history of diabetes mellitus: Secondary | ICD-10-CM

## 2013-04-13 DIAGNOSIS — Z961 Presence of intraocular lens: Secondary | ICD-10-CM

## 2013-04-13 DIAGNOSIS — N039 Chronic nephritic syndrome with unspecified morphologic changes: Secondary | ICD-10-CM

## 2013-04-13 DIAGNOSIS — J9610 Chronic respiratory failure, unspecified whether with hypoxia or hypercapnia: Secondary | ICD-10-CM

## 2013-04-13 DIAGNOSIS — D649 Anemia, unspecified: Secondary | ICD-10-CM

## 2013-04-13 DIAGNOSIS — R7881 Bacteremia: Secondary | ICD-10-CM

## 2013-04-13 DIAGNOSIS — Z66 Do not resuscitate: Secondary | ICD-10-CM | POA: Diagnosis not present

## 2013-04-13 DIAGNOSIS — F0390 Unspecified dementia without behavioral disturbance: Secondary | ICD-10-CM | POA: Diagnosis present

## 2013-04-13 DIAGNOSIS — T40605A Adverse effect of unspecified narcotics, initial encounter: Secondary | ICD-10-CM | POA: Diagnosis present

## 2013-04-13 DIAGNOSIS — Z8673 Personal history of transient ischemic attack (TIA), and cerebral infarction without residual deficits: Secondary | ICD-10-CM

## 2013-04-13 DIAGNOSIS — Z794 Long term (current) use of insulin: Secondary | ICD-10-CM

## 2013-04-13 DIAGNOSIS — I359 Nonrheumatic aortic valve disorder, unspecified: Secondary | ICD-10-CM | POA: Diagnosis present

## 2013-04-13 DIAGNOSIS — I441 Atrioventricular block, second degree: Secondary | ICD-10-CM

## 2013-04-13 DIAGNOSIS — R531 Weakness: Secondary | ICD-10-CM

## 2013-04-13 DIAGNOSIS — E1149 Type 2 diabetes mellitus with other diabetic neurological complication: Secondary | ICD-10-CM | POA: Diagnosis present

## 2013-04-13 DIAGNOSIS — K219 Gastro-esophageal reflux disease without esophagitis: Secondary | ICD-10-CM

## 2013-04-13 DIAGNOSIS — I350 Nonrheumatic aortic (valve) stenosis: Secondary | ICD-10-CM

## 2013-04-13 DIAGNOSIS — I509 Heart failure, unspecified: Secondary | ICD-10-CM | POA: Diagnosis present

## 2013-04-13 DIAGNOSIS — Z880 Allergy status to penicillin: Secondary | ICD-10-CM

## 2013-04-13 DIAGNOSIS — Z9981 Dependence on supplemental oxygen: Secondary | ICD-10-CM

## 2013-04-13 DIAGNOSIS — Z992 Dependence on renal dialysis: Secondary | ICD-10-CM

## 2013-04-13 DIAGNOSIS — J189 Pneumonia, unspecified organism: Secondary | ICD-10-CM

## 2013-04-13 DIAGNOSIS — I429 Cardiomyopathy, unspecified: Secondary | ICD-10-CM

## 2013-04-13 DIAGNOSIS — T400X1A Poisoning by opium, accidental (unintentional), initial encounter: Principal | ICD-10-CM | POA: Diagnosis present

## 2013-04-13 DIAGNOSIS — I498 Other specified cardiac arrhythmias: Secondary | ICD-10-CM | POA: Diagnosis present

## 2013-04-13 DIAGNOSIS — H409 Unspecified glaucoma: Secondary | ICD-10-CM | POA: Diagnosis present

## 2013-04-13 DIAGNOSIS — N186 End stage renal disease: Secondary | ICD-10-CM

## 2013-04-13 DIAGNOSIS — I953 Hypotension of hemodialysis: Secondary | ICD-10-CM | POA: Diagnosis present

## 2013-04-13 DIAGNOSIS — Z9089 Acquired absence of other organs: Secondary | ICD-10-CM

## 2013-04-13 DIAGNOSIS — G929 Unspecified toxic encephalopathy: Secondary | ICD-10-CM | POA: Diagnosis present

## 2013-04-13 DIAGNOSIS — Z7982 Long term (current) use of aspirin: Secondary | ICD-10-CM

## 2013-04-13 DIAGNOSIS — N2581 Secondary hyperparathyroidism of renal origin: Secondary | ICD-10-CM | POA: Diagnosis present

## 2013-04-13 DIAGNOSIS — Z9181 History of falling: Secondary | ICD-10-CM

## 2013-04-13 DIAGNOSIS — E669 Obesity, unspecified: Secondary | ICD-10-CM | POA: Diagnosis present

## 2013-04-13 DIAGNOSIS — G4733 Obstructive sleep apnea (adult) (pediatric): Secondary | ICD-10-CM

## 2013-04-13 DIAGNOSIS — Z9849 Cataract extraction status, unspecified eye: Secondary | ICD-10-CM

## 2013-04-13 DIAGNOSIS — F1110 Opioid abuse, uncomplicated: Secondary | ICD-10-CM | POA: Diagnosis present

## 2013-04-13 DIAGNOSIS — T40601A Poisoning by unspecified narcotics, accidental (unintentional), initial encounter: Secondary | ICD-10-CM

## 2013-04-13 DIAGNOSIS — K922 Gastrointestinal hemorrhage, unspecified: Secondary | ICD-10-CM

## 2013-04-13 DIAGNOSIS — G92 Toxic encephalopathy: Secondary | ICD-10-CM | POA: Diagnosis present

## 2013-04-13 DIAGNOSIS — G8929 Other chronic pain: Secondary | ICD-10-CM | POA: Diagnosis present

## 2013-04-13 DIAGNOSIS — IMO0002 Reserved for concepts with insufficient information to code with codable children: Secondary | ICD-10-CM | POA: Diagnosis not present

## 2013-04-13 DIAGNOSIS — R0602 Shortness of breath: Secondary | ICD-10-CM | POA: Diagnosis present

## 2013-04-13 DIAGNOSIS — Z87891 Personal history of nicotine dependence: Secondary | ICD-10-CM

## 2013-04-13 DIAGNOSIS — Z96649 Presence of unspecified artificial hip joint: Secondary | ICD-10-CM

## 2013-04-13 DIAGNOSIS — J4489 Other specified chronic obstructive pulmonary disease: Secondary | ICD-10-CM | POA: Diagnosis present

## 2013-04-13 DIAGNOSIS — T402X1A Poisoning by other opioids, accidental (unintentional), initial encounter: Secondary | ICD-10-CM

## 2013-04-13 DIAGNOSIS — Z6833 Body mass index (BMI) 33.0-33.9, adult: Secondary | ICD-10-CM

## 2013-04-13 DIAGNOSIS — E662 Morbid (severe) obesity with alveolar hypoventilation: Secondary | ICD-10-CM

## 2013-04-13 DIAGNOSIS — R55 Syncope and collapse: Secondary | ICD-10-CM

## 2013-04-13 DIAGNOSIS — I252 Old myocardial infarction: Secondary | ICD-10-CM

## 2013-04-13 DIAGNOSIS — J9620 Acute and chronic respiratory failure, unspecified whether with hypoxia or hypercapnia: Secondary | ICD-10-CM

## 2013-04-13 DIAGNOSIS — Z79899 Other long term (current) drug therapy: Secondary | ICD-10-CM

## 2013-04-13 DIAGNOSIS — E1142 Type 2 diabetes mellitus with diabetic polyneuropathy: Secondary | ICD-10-CM | POA: Diagnosis present

## 2013-04-13 DIAGNOSIS — Z7902 Long term (current) use of antithrombotics/antiplatelets: Secondary | ICD-10-CM

## 2013-04-13 DIAGNOSIS — I951 Orthostatic hypotension: Secondary | ICD-10-CM | POA: Diagnosis present

## 2013-04-13 DIAGNOSIS — H547 Unspecified visual loss: Secondary | ICD-10-CM

## 2013-04-13 DIAGNOSIS — T84011A Broken internal left hip prosthesis, initial encounter: Secondary | ICD-10-CM

## 2013-04-13 DIAGNOSIS — E785 Hyperlipidemia, unspecified: Secondary | ICD-10-CM | POA: Diagnosis present

## 2013-04-13 DIAGNOSIS — J9600 Acute respiratory failure, unspecified whether with hypoxia or hypercapnia: Secondary | ICD-10-CM

## 2013-04-13 DIAGNOSIS — E119 Type 2 diabetes mellitus without complications: Secondary | ICD-10-CM

## 2013-04-13 DIAGNOSIS — I5030 Unspecified diastolic (congestive) heart failure: Secondary | ICD-10-CM

## 2013-04-13 DIAGNOSIS — E872 Acidosis, unspecified: Secondary | ICD-10-CM | POA: Diagnosis present

## 2013-04-13 HISTORY — DX: Cardiomegaly: I51.7

## 2013-04-13 LAB — CBC WITH DIFFERENTIAL/PLATELET
BASOS ABS: 0.1 10*3/uL (ref 0.0–0.1)
BASOS PCT: 1 % (ref 0–1)
EOS ABS: 0.2 10*3/uL (ref 0.0–0.7)
Eosinophils Relative: 3 % (ref 0–5)
HCT: 36.4 % — ABNORMAL LOW (ref 39.0–52.0)
Hemoglobin: 11.9 g/dL — ABNORMAL LOW (ref 13.0–17.0)
Lymphocytes Relative: 21 % (ref 12–46)
Lymphs Abs: 1.7 10*3/uL (ref 0.7–4.0)
MCH: 31.7 pg (ref 26.0–34.0)
MCHC: 32.7 g/dL (ref 30.0–36.0)
MCV: 97.1 fL (ref 78.0–100.0)
Monocytes Absolute: 1.1 10*3/uL — ABNORMAL HIGH (ref 0.1–1.0)
Monocytes Relative: 13 % — ABNORMAL HIGH (ref 3–12)
NEUTROS ABS: 4.9 10*3/uL (ref 1.7–7.7)
NEUTROS PCT: 62 % (ref 43–77)
Platelets: 210 10*3/uL (ref 150–400)
RBC: 3.75 MIL/uL — ABNORMAL LOW (ref 4.22–5.81)
RDW: 12.4 % (ref 11.5–15.5)
WBC: 7.9 10*3/uL (ref 4.0–10.5)

## 2013-04-13 LAB — TSH: TSH: 1.59 u[IU]/mL (ref 0.350–4.500)

## 2013-04-13 LAB — I-STAT ARTERIAL BLOOD GAS, ED
BICARBONATE: 27.7 meq/L — AB (ref 20.0–24.0)
O2 Saturation: 90 %
PH ART: 7.272 — AB (ref 7.350–7.450)
PO2 ART: 68 mmHg — AB (ref 80.0–100.0)
TCO2: 30 mmol/L (ref 0–100)
pCO2 arterial: 60.1 mmHg (ref 35.0–45.0)

## 2013-04-13 LAB — COMPREHENSIVE METABOLIC PANEL
ALBUMIN: 3.6 g/dL (ref 3.5–5.2)
ALK PHOS: 61 U/L (ref 39–117)
ALT: 11 U/L (ref 0–53)
AST: 9 U/L (ref 0–37)
BUN: 47 mg/dL — ABNORMAL HIGH (ref 6–23)
CO2: 25 mEq/L (ref 19–32)
Calcium: 11.3 mg/dL — ABNORMAL HIGH (ref 8.4–10.5)
Chloride: 97 mEq/L (ref 96–112)
Creatinine, Ser: 9.53 mg/dL — ABNORMAL HIGH (ref 0.50–1.35)
GFR calc Af Amer: 6 mL/min — ABNORMAL LOW (ref >90)
GFR calc non Af Amer: 5 mL/min — ABNORMAL LOW (ref >90)
Glucose, Bld: 215 mg/dL — ABNORMAL HIGH (ref 70–99)
POTASSIUM: 5.1 meq/L (ref 3.7–5.3)
Sodium: 141 mEq/L (ref 137–147)
Total Bilirubin: 0.4 mg/dL (ref 0.3–1.2)
Total Protein: 7.2 g/dL (ref 6.0–8.3)

## 2013-04-13 LAB — I-STAT CHEM 8, ED
BUN: 45 mg/dL — ABNORMAL HIGH (ref 6–23)
CHLORIDE: 101 meq/L (ref 96–112)
Calcium, Ion: 1.4 mmol/L — ABNORMAL HIGH (ref 1.13–1.30)
Creatinine, Ser: 9 mg/dL — ABNORMAL HIGH (ref 0.50–1.35)
GLUCOSE: 212 mg/dL — AB (ref 70–99)
HCT: 38 % — ABNORMAL LOW (ref 39.0–52.0)
Hemoglobin: 12.9 g/dL — ABNORMAL LOW (ref 13.0–17.0)
POTASSIUM: 4.8 meq/L (ref 3.7–5.3)
Sodium: 139 mEq/L (ref 137–147)
TCO2: 27 mmol/L (ref 0–100)

## 2013-04-13 LAB — MAGNESIUM: Magnesium: 1.9 mg/dL (ref 1.5–2.5)

## 2013-04-13 LAB — MRSA PCR SCREENING: MRSA by PCR: NEGATIVE

## 2013-04-13 LAB — I-STAT CG4 LACTIC ACID, ED: Lactic Acid, Venous: 1.33 mmol/L (ref 0.5–2.2)

## 2013-04-13 LAB — ETHANOL: Alcohol, Ethyl (B): <11 mg/dL (ref 0–11)

## 2013-04-13 LAB — PHOSPHORUS: Phosphorus: 9.5 mg/dL — ABNORMAL HIGH (ref 2.3–4.6)

## 2013-04-13 LAB — HEMOGLOBIN A1C
Hgb A1c MFr Bld: 7.7 % — ABNORMAL HIGH (ref <5.7)
Mean Plasma Glucose: 174 mg/dL — ABNORMAL HIGH (ref <117)

## 2013-04-13 LAB — TROPONIN I: Troponin I: <0.3 ng/mL (ref <0.30)

## 2013-04-13 LAB — GLUCOSE, CAPILLARY: GLUCOSE-CAPILLARY: 165 mg/dL — AB (ref 70–99)

## 2013-04-13 MED ORDER — ONDANSETRON HCL 4 MG/2ML IJ SOLN
4.0000 mg | Freq: Four times a day (QID) | INTRAMUSCULAR | Status: DC | PRN
  Filled 2013-04-13: qty 2

## 2013-04-13 MED ORDER — NALOXONE HCL 1 MG/ML IJ SOLN
1.0000 mg | Freq: Once | INTRAMUSCULAR | Status: AC
  Administered 2013-04-13: 1 mg via INTRAVENOUS
  Filled 2013-04-13: qty 2

## 2013-04-13 MED ORDER — CARBOXYMETHYLCELLULOSE SODIUM 1 % OP SOLN: 1.0000 [drp] | OPHTHALMIC | Status: DC

## 2013-04-13 MED ORDER — SODIUM CHLORIDE 0.9 % IV SOLN: 250.0000 mL | INTRAVENOUS | Status: DC | PRN

## 2013-04-13 MED ORDER — ASPIRIN 325 MG PO TABS
325.0000 mg | ORAL_TABLET | Freq: Every day | ORAL | Status: DC
  Administered 2013-04-15 – 2013-04-21 (×4): 325 mg via ORAL
  Filled 2013-04-13 (×10): qty 1

## 2013-04-13 MED ORDER — SODIUM CHLORIDE 0.9 % IJ SOLN: 3.0000 mL | INTRAMUSCULAR | Status: DC | PRN

## 2013-04-13 MED ORDER — HEPARIN SODIUM (PORCINE) 5000 UNIT/ML IJ SOLN
5000.0000 [IU] | Freq: Three times a day (TID) | INTRAMUSCULAR | Status: DC
  Administered 2013-04-13 – 2013-04-22 (×21): 5000 [IU] via SUBCUTANEOUS
  Filled 2013-04-13 (×29): qty 1

## 2013-04-13 MED ORDER — CALCIUM CARBONATE-VITAMIN D 500-200 MG-UNIT PO TABS
1.0000 | ORAL_TABLET | Freq: Every day | ORAL | Status: DC
  Filled 2013-04-13 (×2): qty 1

## 2013-04-13 MED ORDER — RENA-VITE PO TABS
1.0000 | ORAL_TABLET | Freq: Every day | ORAL | Status: DC
  Administered 2013-04-13 – 2013-04-21 (×7): 1 via ORAL
  Filled 2013-04-13 (×10): qty 1

## 2013-04-13 MED ORDER — METOPROLOL TARTRATE 50 MG PO TABS
50.0000 mg | ORAL_TABLET | Freq: Two times a day (BID) | ORAL | Status: DC
  Filled 2013-04-13 (×5): qty 1

## 2013-04-13 MED ORDER — INSULIN GLARGINE 100 UNIT/ML ~~LOC~~ SOLN
20.0000 [IU] | Freq: Two times a day (BID) | SUBCUTANEOUS | Status: DC
  Administered 2013-04-13 – 2013-04-16 (×5): 20 [IU] via SUBCUTANEOUS
  Filled 2013-04-13 (×9): qty 0.2

## 2013-04-13 MED ORDER — CALCIUM ACETATE 667 MG PO CAPS
2866.0000 mg | ORAL_CAPSULE | Freq: Three times a day (TID) | ORAL | Status: DC
  Filled 2013-04-13 (×4): qty 4

## 2013-04-13 MED ORDER — ARTIFICIAL TEARS OP OINT
1.0000 "application " | TOPICAL_OINTMENT | Freq: Two times a day (BID) | OPHTHALMIC | Status: DC
  Administered 2013-04-13 – 2013-04-21 (×14): 1 via OPHTHALMIC
  Filled 2013-04-13 (×3): qty 3.5

## 2013-04-13 MED ORDER — ALBUTEROL SULFATE (2.5 MG/3ML) 0.083% IN NEBU
2.5000 mg | INHALATION_SOLUTION | RESPIRATORY_TRACT | Status: DC | PRN
  Administered 2013-04-13: 2.5 mg via RESPIRATORY_TRACT
  Filled 2013-04-13: qty 3

## 2013-04-13 MED ORDER — MIDODRINE HCL 5 MG PO TABS: 5.0000 mg | ORAL_TABLET | Freq: Three times a day (TID) | ORAL | Status: DC

## 2013-04-13 MED ORDER — VITAMIN B-12 1000 MCG PO TABS
1000.0000 ug | ORAL_TABLET | Freq: Every day | ORAL | Status: DC
  Administered 2013-04-15 – 2013-04-21 (×4): 1000 ug via ORAL
  Filled 2013-04-13 (×10): qty 1

## 2013-04-13 MED ORDER — BRIMONIDINE TARTRATE 0.2 % OP SOLN
1.0000 [drp] | Freq: Two times a day (BID) | OPHTHALMIC | Status: DC
  Administered 2013-04-13 – 2013-04-21 (×14): 1 [drp] via OPHTHALMIC
  Filled 2013-04-13 (×2): qty 5

## 2013-04-13 MED ORDER — ACETAMINOPHEN 325 MG PO TABS
650.0000 mg | ORAL_TABLET | Freq: Four times a day (QID) | ORAL | Status: DC | PRN
  Administered 2013-04-15: 325 mg via ORAL
  Administered 2013-04-16 – 2013-04-21 (×4): 650 mg via ORAL
  Filled 2013-04-13 (×4): qty 2

## 2013-04-13 MED ORDER — MIDODRINE HCL 5 MG PO TABS
5.0000 mg | ORAL_TABLET | Freq: Three times a day (TID) | ORAL | Status: DC
  Filled 2013-04-13 (×7): qty 1

## 2013-04-13 MED ORDER — GABAPENTIN 100 MG PO CAPS
100.0000 mg | ORAL_CAPSULE | Freq: Three times a day (TID) | ORAL | Status: DC
  Administered 2013-04-13: 100 mg via ORAL
  Filled 2013-04-13 (×4): qty 1

## 2013-04-13 MED ORDER — CINACALCET HCL 30 MG PO TABS
60.0000 mg | ORAL_TABLET | Freq: Two times a day (BID) | ORAL | Status: DC
  Administered 2013-04-15 – 2013-04-22 (×8): 60 mg via ORAL
  Filled 2013-04-13 (×19): qty 2

## 2013-04-13 MED ORDER — PANTOPRAZOLE SODIUM 40 MG PO TBEC
40.0000 mg | DELAYED_RELEASE_TABLET | Freq: Every day | ORAL | Status: DC
  Administered 2013-04-15 – 2013-04-21 (×4): 40 mg via ORAL
  Filled 2013-04-13 (×4): qty 1

## 2013-04-13 MED ORDER — SIMVASTATIN 20 MG PO TABS
20.0000 mg | ORAL_TABLET | Freq: Every day | ORAL | Status: DC
  Administered 2013-04-15 – 2013-04-21 (×4): 20 mg via ORAL
  Filled 2013-04-13 (×9): qty 1

## 2013-04-13 MED ORDER — HYDRALAZINE HCL 10 MG PO TABS
10.0000 mg | ORAL_TABLET | Freq: Three times a day (TID) | ORAL | Status: DC
  Filled 2013-04-13 (×7): qty 1

## 2013-04-13 MED ORDER — POLYVINYL ALCOHOL 1.4 % OP SOLN
1.0000 [drp] | OPHTHALMIC | Status: DC | PRN
  Administered 2013-04-15 – 2013-04-19 (×2): 1 [drp] via OPHTHALMIC
  Filled 2013-04-13: qty 15

## 2013-04-13 MED ORDER — ONDANSETRON HCL 4 MG PO TABS: 4.0000 mg | ORAL_TABLET | Freq: Four times a day (QID) | ORAL | Status: DC | PRN

## 2013-04-13 MED ORDER — ACETAMINOPHEN 650 MG RE SUPP: 650.0000 mg | Freq: Four times a day (QID) | RECTAL | Status: DC | PRN

## 2013-04-13 MED ORDER — SODIUM CHLORIDE 0.9 % IJ SOLN
3.0000 mL | Freq: Two times a day (BID) | INTRAMUSCULAR | Status: DC
  Administered 2013-04-13 – 2013-04-15 (×4): 3 mL via INTRAVENOUS

## 2013-04-13 MED ORDER — DIALYVITE 3000 3 MG PO TABS: 1.0000 | ORAL_TABLET | Freq: Every day | ORAL | Status: DC

## 2013-04-13 MED ORDER — CINACALCET HCL 30 MG PO TABS: 60.0000 mg | ORAL_TABLET | Freq: Two times a day (BID) | ORAL | Status: DC

## 2013-04-13 MED ORDER — INSULIN ASPART 100 UNIT/ML ~~LOC~~ SOLN
0.0000 [IU] | Freq: Three times a day (TID) | SUBCUTANEOUS | Status: DC
  Administered 2013-04-15: 2 [IU] via SUBCUTANEOUS
  Administered 2013-04-15: 1 [IU] via SUBCUTANEOUS

## 2013-04-13 MED ORDER — ROPINIROLE HCL 0.5 MG PO TABS
0.5000 mg | ORAL_TABLET | Freq: Every day | ORAL | Status: DC
  Administered 2013-04-13 – 2013-04-21 (×7): 0.5 mg via ORAL
  Filled 2013-04-13 (×10): qty 1

## 2013-04-13 NOTE — ED Notes (Signed)
Report attempted 

## 2013-04-13 NOTE — ED Notes (Signed)
Patient is unable to stay awake. Needs constant stimulation. He is able to states the year, month and holiday in April but unable to answer directly other questions even with repeating them to him. Unable to get him to squeeze my hands or maneuver his feet. He is maintaining his airway and oxygen level is greater than 96%.

## 2013-04-13 NOTE — ED Notes (Signed)
Patient arrived via [facility] from home post fall. Patient was sitting on the side of the bed when he fell to the floor. [facility] states he did not hit anything. Patient is lethargic and according to [facility] his wife states he has been taking oxycodone but he has not pill bottle for them. Patient is home O2 dependant.

## 2013-04-13 NOTE — ED Notes (Signed)
Lactic acid results called to primary nurse Tresa Endo

## 2013-04-13 NOTE — ED Notes (Signed)
Prior to narcan administration patient was slightly awake. Opened his eyes to verbal stimulation which is improvement from when he arrived. Post Narcan patient has his eyes open and trying to adjust his BiPap. He is able to tell me that he fell at home.

## 2013-04-13 NOTE — ED Notes (Signed)
Patient is home oxygen dependant at home. Placed him on 3L [place].

## 2013-04-13 NOTE — ED Provider Notes (Signed)
CSN: [id number]     Arrival date & time 04/13/13  1126 History   First MD Initiated Contact with Patient 04/13/13 1128     Chief Complaint  Patient presents with  . Fall     (Consider location/radiation/quality/duration/timing/severity/associated sxs/prior Treatment) HPI Comments: Patient brought to the ER by ambulance after a fall. Patient was reportedly sitting on the edge of the bed and fell to the floor. He might have fallen asleep or passed out. He was unable to get back up off the ground. EMS reports that frequently asked to come to the house for falls. Usually they can get him up off the floor and he does not want to be transported. Today, however, wife says that he has been more weak than usual. He has had a persistent cough, but states that he cannot bring anything up. He denies pain.  Patient is a 68 y.o. male presenting with fall.  Fall    Past Medical History  Diagnosis Date  . CHF (congestive heart failure)   . CAD (coronary artery disease) 2006  . COPD (chronic obstructive pulmonary disease)   . Hyperlipidemia   . ESRD (end stage renal disease) on dialysis started 08/2006    [facility] [place]; "getting ready to have it on TTS" (05/07/2012)  . Type II diabetes mellitus     etiology of ESRD  . Diabetic blindness   . Hypertension   . MI, old 2006  . Asthma   . Pneumonia     "everytime I take a cold" (05/07/2012)  . Bronchiectasis   . Shortness of breath     "can happen at anytime" (05/07/2012)  . GERD (gastroesophageal reflux disease)   . Stroke 2006    "numb on left side since"  05/07/2012  . On home oxygen therapy     "3L at night mostly; also prn" (05/07/2012)  . Cardiomegaly    Past Surgical History  Procedure Laterality Date  . Av fistula placement Right 09/2006    "used it once" (05/07/2012)  . Appendectomy  ~ 1958  . Insertion of dialysis catheter Left 09/2006    "chest" (05/07/2012)  . Total hip arthroplasty Left 1997  . Repair ankle ligament Left 1981  . Cataract  extraction w/ intraocular lens implant Left ?2012   Family History  Problem Relation Age of Onset  . Diabetes Mellitus II Father    History  Substance Use Topics  . Smoking status: Former Smoker -- 2.00 packs/day for 30 years    Types: Cigarettes    Quit date: 01/02/1984  . Smokeless tobacco: Never Used  . Alcohol Use: Yes     Comment: 05/07/2012 "stopped drinking 1979"    Review of Systems  Constitutional: Positive for fatigue.  All other systems reviewed and are negative.     Allergies  Cefepime and Penicillins  Home Medications   Current Outpatient Rx  Name  Route  Sig  Dispense  Refill  . calcium-vitamin D (OSCAL WITH D) 500-200 MG-UNIT per tablet   Oral   Take 1 tablet by mouth.         . cinacalcet (SENSIPAR) 60 MG tablet   Oral   Take 60 mg by mouth daily.         . cyanocobalamin 500 MCG tablet   Oral   Take 1,000 mcg by mouth daily.         . folic acid-vitamin b complex-vitamin c-selenium-zinc (DIALYVITE) 3 MG TABS   Oral   Take 1 tablet by  mouth daily.         . hydrALAZINE (APRESOLINE) 10 MG tablet   Oral   Take 10 mg by mouth 3 (three) times daily.         . metoprolol (LOPRESSOR) 50 MG tablet   Oral   Take 50 mg by mouth 2 (two) times daily.         Marland Kitchen omeprazole (PRILOSEC) 20 MG capsule   Oral   Take 20 mg by mouth 2 (two) times daily.          . pravastatin (PRAVACHOL) 40 MG tablet   Oral   Take 40 mg by mouth at bedtime.          Marland Kitchen rOPINIRole (REQUIP) 0.25 MG tablet   Oral   Take 0.5 mg by mouth at bedtime. 1 tablet twice a day and 2 tablets at bedtime.         . sennosides-docusate sodium (SENOKOT-S) 8.6-50 MG tablet   Oral   Take 1 tablet by mouth 2 (two) times daily as needed for constipation. For constipation         . artificial tears (LACRILUBE) OINT ophthalmic ointment   Right Eye   Place 1 application into the right eye 2 (two) times daily.         Marland Kitchen aspirin 325 MG tablet   Oral   Take 1 tablet  (325 mg total) by mouth daily.         . brimonidine (ALPHAGAN) 0.2 % ophthalmic solution   Both Eyes   Place 1 drop into both eyes 2 (two) times daily.         . calcium acetate (PHOSLO) 667 MG capsule   Oral   Take 4 capsules (2,668 mg total) by mouth 3 (three) times daily with meals.   360 capsule   0   . carboxymethylcellulose (REFRESH CELLUVISC) 1 % ophthalmic solution   Both Eyes   Place 1 drop into both eyes every 2 (two) hours.         . gabapentin (NEURONTIN) 100 MG capsule   Oral   Take 300 mg by mouth at bedtime as needed (nerve pain).         . insulin aspart (NOVOLOG) 100 UNIT/ML injection   Subcutaneous   Inject 26 Units into the skin 3 (three) times daily with meals.         . insulin glargine (LANTUS) 100 UNIT/ML injection   Subcutaneous   Inject 35 Units into the skin 2 (two) times daily.         . midodrine (PROAMATINE) 10 MG tablet   Oral   Take 1 tablet (10 mg total) by mouth 3 (three) times daily.   30 tablet   0   . oxycodone (OXY-IR) 5 MG capsule   Oral   Take 10 mg by mouth every 6 (six) hours as needed for pain.           BP 169/90  Pulse 71  Temp(Src) 98.6 F (37 C) (Oral)  Resp 15  Ht 6' (1.829 m)  SpO2 94% Physical Exam  Constitutional: He is oriented to person, place, and time. He appears well-developed and well-nourished. He appears listless. No distress.  HENT:  Head: Normocephalic and atraumatic.  Right Ear: Hearing normal.  Left Ear: Hearing normal.  Nose: Nose normal.  Mouth/Throat: Oropharynx is clear and moist and mucous membranes are normal.  Eyes: Conjunctivae and EOM are normal. Pupils are equal, round, and reactive  to light.  Neck: Normal range of motion. Neck supple.  Cardiovascular: Regular rhythm, S1 normal and S2 normal.  Exam reveals no gallop and no friction rub.   No murmur heard. Pulmonary/Chest: Effort normal. No respiratory distress. He has wheezes. He has rales. He exhibits no tenderness.   Abdominal: Soft. Normal appearance and bowel sounds are normal. There is no hepatosplenomegaly. There is no tenderness. There is no rebound, no guarding, no tenderness at McBurney's point and negative Murphy's sign. No hernia.  Musculoskeletal: Normal range of motion. He exhibits edema.  Neurological: He is oriented to person, place, and time. He has normal strength. He appears listless. No cranial nerve deficit or sensory deficit. Coordination normal. GCS eye subscore is 4. GCS verbal subscore is 5. GCS motor subscore is 6.  Skin: Skin is warm, dry and intact. No rash noted. No cyanosis.  Psychiatric: He has a normal mood and affect. His speech is normal and behavior is normal. Thought content normal.    ED Course  Procedures (including critical care time) Labs Review Labs Reviewed  CBC WITH DIFFERENTIAL - Abnormal; Notable for the following:    RBC 3.75 (*)    Hemoglobin 11.9 (*)    HCT 36.4 (*)    Monocytes Relative 13 (*)    Monocytes Absolute 1.1 (*)    All other components within normal limits  COMPREHENSIVE METABOLIC PANEL - Abnormal; Notable for the following:    Glucose, Bld 215 (*)    BUN 47 (*)    Creatinine, Ser 9.53 (*)    Calcium 11.3 (*)    GFR calc non Af Amer 5 (*)    GFR calc Af Amer 6 (*)    All other components within normal limits  I-STAT CHEM 8, ED - Abnormal; Notable for the following:    BUN 45 (*)    Creatinine, Ser 9.00 (*)    Glucose, Bld 212 (*)    Calcium, Ion 1.40 (*)    Hemoglobin 12.9 (*)    HCT 38.0 (*)    All other components within normal limits  I-STAT ARTERIAL BLOOD GAS, ED - Abnormal; Notable for the following:    pH, Arterial 7.272 (*)    pCO2 arterial 60.1 (*)    pO2, Arterial 68.0 (*)    Bicarbonate 27.7 (*)    All other components within normal limits  TROPONIN I  I-STAT CG4 LACTIC ACID, ED   Imaging Review Dg Chest 2 View  04/13/2013   CLINICAL DATA:  Cough, shortness of Breath  EXAM: CHEST  2 VIEW  COMPARISON:  09/25/2012   FINDINGS: Cardiomegaly again noted. Stable left IJ dialysis catheter. Again noted calcified structure adjacent to aortic arch stable from prior exam. No acute infiltrate or pleural effusion. No pulmonary edema. Mild degenerative changes thoracic spine.  IMPRESSION: No active cardiopulmonary disease.   Electronically Signed   By: Natasha Mead M.D.   On: 04/13/2013 12:58   Ct Head Wo Contrast  04/13/2013   CLINICAL DATA:  68 year old male status post fall. Lethargy. Initial encounter.  EXAM: CT HEAD WITHOUT CONTRAST  CT CERVICAL SPINE WITHOUT CONTRAST  TECHNIQUE: Multidetector CT imaging of the head and cervical spine was performed following the standard protocol without intravenous contrast. Multiplanar CT image reconstructions of the cervical spine were also generated.  COMPARISON:  Head CTs without contrast 05/07/2012 and earlier.  FINDINGS: CT HEAD FINDINGS  Advanced calcified atherosclerosis noted diffusely. No acute scalp or orbits soft tissue abnormality identified. Increased widespread paranasal sinus  mucosal thickening and opacification. Chronic bubbly opacity in the left sphenoid sinus. Stable mastoids and tympanic cavities. No skull fracture identified.  Stable cerebral volume. No ventriculomegaly. No midline shift, mass effect, or evidence of intracranial mass lesion. No acute intracranial hemorrhage identified. Stable nonspecific cerebral white matter hypodensity. No evidence of cortically based acute infarction identified. No suspicious intracranial vascular hyperdensity.  CT CERVICAL SPINE FINDINGS  Straightening of cervical lordosis. Visualized skull base is intact. No atlanto-occipital dissociation. No acute cervical spine fracture identified. Retropharyngeal course of both carotid arteries with calcified plaque.  IMPRESSION: 1. Nonspecific cerebral white matter changes. No acute intracranial abnormality. 2. No acute fracture or listhesis identified in the cervical spine. Ligamentous injury is not  excluded. 3. Advanced diffuse calcified atherosclerosis suggesting chronic renal disease/renal failure. 4. Increased paranasal sinus inflammation.   Electronically Signed   By: Augusto Gamble M.D.   On: 04/13/2013 14:12   Ct Cervical Spine Wo Contrast  04/13/2013   CLINICAL DATA:  68 year old male status post fall. Lethargy. Initial encounter.  EXAM: CT HEAD WITHOUT CONTRAST  CT CERVICAL SPINE WITHOUT CONTRAST  TECHNIQUE: Multidetector CT imaging of the head and cervical spine was performed following the standard protocol without intravenous contrast. Multiplanar CT image reconstructions of the cervical spine were also generated.  COMPARISON:  Head CTs without contrast 05/07/2012 and earlier.  FINDINGS: CT HEAD FINDINGS  Advanced calcified atherosclerosis noted diffusely. No acute scalp or orbits soft tissue abnormality identified. Increased widespread paranasal sinus mucosal thickening and opacification. Chronic bubbly opacity in the left sphenoid sinus. Stable mastoids and tympanic cavities. No skull fracture identified.  Stable cerebral volume. No ventriculomegaly. No midline shift, mass effect, or evidence of intracranial mass lesion. No acute intracranial hemorrhage identified. Stable nonspecific cerebral white matter hypodensity. No evidence of cortically based acute infarction identified. No suspicious intracranial vascular hyperdensity.  CT CERVICAL SPINE FINDINGS  Straightening of cervical lordosis. Visualized skull base is intact. No atlanto-occipital dissociation. No acute cervical spine fracture identified. Retropharyngeal course of both carotid arteries with calcified plaque.  IMPRESSION: 1. Nonspecific cerebral white matter changes. No acute intracranial abnormality. 2. No acute fracture or listhesis identified in the cervical spine. Ligamentous injury is not excluded. 3. Advanced diffuse calcified atherosclerosis suggesting chronic renal disease/renal failure. 4. Increased paranasal sinus inflammation.    Electronically Signed   By: Augusto Gamble M.D.   On: 04/13/2013 14:12     EKG Interpretation   Date/Time:  Monday April 13 2013 11:32:11 EDT Ventricular Rate:  85 PR Interval:  302 QRS Duration: 161 QT Interval:  466 QTC Calculation: 554 R Axis:   150 Text Interpretation:  Sinus or ectopic atrial rhythm Intraventricular  conduction delay Baseline wander in lead(s) V2 with QRS widening since  last tracing Confirmed by Cynthia Cogle  MD, Aryia Delira [id number]) on 04/13/2013  12:23:43 PM      MDM   Final diagnoses:  Acute respiratory failure  Opioid overdose    Patient presents to the ER for evaluation after a fall. Patient was extremely confused, somnolent upon arrival. Patient has apparently just been started on oxycodone in the last few days. Workup for acute mental status changes as well as injury from fall were performed. Head CT, cervical spine CT were unremarkable. Lab work was also fairly unremarkable. Patient is a dialysis patient, it is not clear at this time what days he receives dialysis.  Patient first hypoxic upon arrival. He was in respiratory distress and was therefore initiated on BiPAP. He did seem to improve  with his mentation as well as breathing. Blood gas did prove acute respiratory acidosis secondary to respiratory failure. Chest x-ray did not show any evidence of pneumonia. Cannot rule out aspiration, patient does have cough and congestion on examination.  The patient was administered Narcan and did become much more awake and alert. He is still somewhat confused. As he is a dialysis patient, it is not clear if he is going to have rebound somnolence after the Narcan wears off. He will be admitted to the step down unit by the hospitalist service. Will consult nephrology for consideration of dialysis.    Gilda Creasehristopher J. Gabriella Woodhead, MD 04/13/13 [phone]

## 2013-04-13 NOTE — ED Notes (Signed)
Post Narcan administration patient became awake and removed his BiPap machine. Stated he needed to call his bank. He can recall falling at home prior to coming to ED. Patient is wanting to leave the ER without any interventions. EDP aware.

## 2013-04-13 NOTE — H&P (Signed)
[facility] History and Physical  [name] QAS:[id number] DOB: 16-Mar-1945 DOA: 04/13/2013  Referring physician: Dr.Pollina PCP: [name], MD   Chief Complaint: Acute on chronic respiratory failure, encephalopathy (do to non-intentional opiate overdose)  HPI: [name] is a 68 y.o. male with past medical history significant for chronic respiratory failure secondary to COPD (on oxygen supplementation 3 L at home), diabetes mellitus (on insulin), hyperlipidemia, hypertension (with orthostatic changes requiring midrodine, especially on HD days), obesity, care, restless leg syndrome/neuropathy, end-stage renal disease (hemodialysis Tuesday-Thursday-Saturday) and chronic pain; came to the hospital after he experienced an episode of fall from his bed with inability to get up. EMS has reported that on multiple occasions they have been called before due to patient experiencing falls and then having difficulties to get up or being assisted by his wife. This time they found patient to be more lethargic, with difficulty breathing and hypoxic while waiting his usual 3 L of oxygen at home. Per patient's wife he has been recently started on OxyContin and she feel he has taken a little bit more than what has been prescribed to try to control his pain. At that moment patient reached the emergency department he had an ABG demonstrating mild acidosis with hypercapnia and hypoxemia. Patient condition drastically improved with administration of Narcan and use of BiPAP for approximately 4 hours. At that moment patient was able to protect his airways abdomen pain good oxygen saturation on his chronic 3 L of oxygen supplementation. Seems he was still lethargic (even easily arouse) and with concerns of possible rebound obtundent state after Narcan effect wear off; [facility] to admit the patient for further evaluation and treatment. On my evaluation patient was lethargic/somnolent, but  easily Bernoulli arouse and able to follow commands. Patient denies any chest pain, shortness of breath, fever, abdominal pain, nausea/vomiting, melena, hematochezia or any other acute complaints.     Review of Systems:  Negative except as otherwise mentioned on history of present illness.  Past Medical History  Diagnosis Date  . CHF (congestive heart failure)   . CAD (coronary artery disease) 2006  . COPD (chronic obstructive pulmonary disease)   . Hyperlipidemia   . ESRD (end stage renal disease) on dialysis started 08/2006    MTTS [facility]; "getting ready to have it on TTS" (05/07/2012)  . Type II diabetes mellitus     etiology of ESRD  . Diabetic blindness   . Hypertension   . MI, old 2006  . Asthma   . Pneumonia     "everytime I take a cold" (05/07/2012)  . Bronchiectasis   . Shortness of breath     "can happen at anytime" (05/07/2012)  . GERD (gastroesophageal reflux disease)   . Stroke 2006    "numb on left side since"  05/07/2012  . On home oxygen therapy     "3L at night mostly; also prn" (05/07/2012)  . Cardiomegaly    Past Surgical History  Procedure Laterality Date  . Av fistula placement Right 09/2006    "used it once" (05/07/2012)  . Appendectomy  ~ 1958  . Insertion of dialysis catheter Left 09/2006    "chest" (05/07/2012)  . Total hip arthroplasty Left 1997  . Repair ankle ligament Left 1981  . Cataract extraction w/ intraocular lens implant Left ?2012   Social History:  reports that he quit smoking about 29 years ago. His smoking use included Cigarettes. He has a 60 pack-year smoking history. He has never used  smokeless tobacco. He reports that he drinks alcohol. He reports that he does not use illicit drugs.  Allergies  Allergen Reactions  . Cefepime Itching  . Penicillins Hives and Swelling    Family History  Problem Relation Age of Onset  . Diabetes Mellitus II Father      Prior to Admission medications   Medication Sig Start Date End Date Taking?  Authorizing Provider  calcium-vitamin D (OSCAL WITH D) 500-200 MG-UNIT per tablet Take 1 tablet by mouth.   Yes Historical Provider, MD  cinacalcet (SENSIPAR) 60 MG tablet Take 60 mg by mouth daily.   Yes Historical Provider, MD  cyanocobalamin 500 MCG tablet Take 1,000 mcg by mouth daily.   Yes Historical Provider, MD  folic acid-vitamin b complex-vitamin c-selenium-zinc (DIALYVITE) 3 MG TABS Take 1 tablet by mouth daily.   Yes Historical Provider, MD  hydrALAZINE (APRESOLINE) 10 MG tablet Take 10 mg by mouth 3 (three) times daily.   Yes Historical Provider, MD  metoprolol (LOPRESSOR) 50 MG tablet Take 50 mg by mouth 2 (two) times daily.   Yes Historical Provider, MD  omeprazole (PRILOSEC) 20 MG capsule Take 20 mg by mouth 2 (two) times daily.    Yes Historical Provider, MD  pravastatin (PRAVACHOL) 40 MG tablet Take 40 mg by mouth at bedtime.    Yes Historical Provider, MD  rOPINIRole (REQUIP) 0.25 MG tablet Take 0.5 mg by mouth at bedtime. 1 tablet twice a day and 2 tablets at bedtime.   Yes Historical Provider, MD  sennosides-docusate sodium (SENOKOT-S) 8.6-50 MG tablet Take 1 tablet by mouth 2 (two) times daily as needed for constipation. For constipation   Yes Historical Provider, MD  artificial tears (LACRILUBE) OINT ophthalmic ointment Place 1 application into the right eye 2 (two) times daily.    Historical Provider, MD  aspirin 325 MG tablet Take 1 tablet (325 mg total) by mouth daily. 09/08/12   Esperanza Sheets, MD  brimonidine (ALPHAGAN) 0.2 % ophthalmic solution Place 1 drop into both eyes 2 (two) times daily.    Historical Provider, MD  calcium acetate (PHOSLO) 667 MG capsule Take 4 capsules (2,668 mg total) by mouth 3 (three) times daily with meals. 11/04/11   Joseph Art, DO  carboxymethylcellulose (REFRESH CELLUVISC) 1 % ophthalmic solution Place 1 drop into both eyes every 2 (two) hours.    Historical Provider, MD  gabapentin (NEURONTIN) 100 MG capsule Take 300 mg by mouth at  bedtime as needed (nerve pain).    Historical Provider, MD  insulin aspart (NOVOLOG) 100 UNIT/ML injection Inject 26 Units into the skin 3 (three) times daily with meals.    Historical Provider, MD  insulin glargine (LANTUS) 100 UNIT/ML injection Inject 35 Units into the skin 2 (two) times daily.    Historical Provider, MD  midodrine (PROAMATINE) 10 MG tablet Take 1 tablet (10 mg total) by mouth 3 (three) times daily. 05/11/12   Zannie Cove, MD  oxycodone (OXY-IR) 5 MG capsule Take 10 mg by mouth every 6 (six) hours as needed for pain.     Historical Provider, MD   Physical Exam: Filed Vitals:   04/13/13 1713  BP: 203/103  Pulse: 76  Temp:   Resp: 13    BP 203/103  Pulse 76  Temp(Src) 98.6 F (37 C) (Oral)  Resp 13  Ht 6' (1.829 m)  SpO2 100%  General:  Somnolent, but verbally arouse; no fever, protecting airways and with good oxygen saturation on nasal cannula oxygen supplementation (  3 L) Eyes: PERRL, normal conjunctiva, no icterus, slight blepharitis appreciated on exam ENT: grossly normal hearing, lips & tongue; moist mucous membranes, no erythema or exudate inside his mouth. No drainage out of his ears or nostrils appreciated Neck: no LAD, masses or thyromegaly, no JVD appreciated (even difficult to assess due to body habitus) Cardiovascular: RRR, no m/r/g. No LE edema. Telemetry: SR, no arrhythmias  Respiratory: CTA bilaterally, no w/r/r. Normal respiratory effort. Abdomen: soft, nt, nd, obese, no guarding, positive bowel sounds Skin: no rash, petechia or induration appreciated on exam Musculoskeletal: grossly normal tone BUE/BLE Neurologic: Patient somnolent and lethargic, able to follow simple commands, legally blind 3-4/5 muscle strength bilaterally (due to poor effort or generalized weakness).          Labs on Admission:  Basic Metabolic Panel:  Recent Labs Lab 04/13/13 1155 04/13/13 1207  NA 141 139  K 5.1 4.8  CL 97 101  CO2 25  --   GLUCOSE 215* 212*    BUN 47* 45*  CREATININE 9.53* 9.00*  CALCIUM 11.3*  --    Liver Function Tests:  Recent Labs Lab 04/13/13 1155  AST 9  ALT 11  ALKPHOS 61  BILITOT 0.4  PROT 7.2  ALBUMIN 3.6   CBC:  Recent Labs Lab 04/13/13 1155 04/13/13 1207  WBC 7.9  --   NEUTROABS 4.9  --   HGB 11.9* 12.9*  HCT 36.4* 38.0*  MCV 97.1  --   PLT 210  --    Cardiac Enzymes:  Recent Labs Lab 04/13/13 1155  TROPONINI <0.30    Radiological Exams on Admission: Dg Chest 2 View  04/13/2013   CLINICAL DATA:  Cough, shortness of Breath  EXAM: CHEST  2 VIEW  COMPARISON:  09/25/2012  FINDINGS: Cardiomegaly again noted. Stable left IJ dialysis catheter. Again noted calcified structure adjacent to aortic arch stable from prior exam. No acute infiltrate or pleural effusion. No pulmonary edema. Mild degenerative changes thoracic spine.  IMPRESSION: No active cardiopulmonary disease.   Electronically Signed   By: Natasha MeadLiviu  Pop M.D.   On: 04/13/2013 12:58   Ct Head Wo Contrast  04/13/2013   CLINICAL DATA:  68 year old male status post fall. Lethargy. Initial encounter.  EXAM: CT HEAD WITHOUT CONTRAST  CT CERVICAL SPINE WITHOUT CONTRAST  TECHNIQUE: Multidetector CT imaging of the head and cervical spine was performed following the standard protocol without intravenous contrast. Multiplanar CT image reconstructions of the cervical spine were also generated.  COMPARISON:  Head CTs without contrast 05/07/2012 and earlier.  FINDINGS: CT HEAD FINDINGS  Advanced calcified atherosclerosis noted diffusely. No acute scalp or orbits soft tissue abnormality identified. Increased widespread paranasal sinus mucosal thickening and opacification. Chronic bubbly opacity in the left sphenoid sinus. Stable mastoids and tympanic cavities. No skull fracture identified.  Stable cerebral volume. No ventriculomegaly. No midline shift, mass effect, or evidence of intracranial mass lesion. No acute intracranial hemorrhage identified. Stable  nonspecific cerebral white matter hypodensity. No evidence of cortically based acute infarction identified. No suspicious intracranial vascular hyperdensity.  CT CERVICAL SPINE FINDINGS  Straightening of cervical lordosis. Visualized skull base is intact. No atlanto-occipital dissociation. No acute cervical spine fracture identified. Retropharyngeal course of both carotid arteries with calcified plaque.  IMPRESSION: 1. Nonspecific cerebral white matter changes. No acute intracranial abnormality. 2. No acute fracture or listhesis identified in the cervical spine. Ligamentous injury is not excluded. 3. Advanced diffuse calcified atherosclerosis suggesting chronic renal disease/renal failure. 4. Increased paranasal sinus inflammation.   Electronically  Signed   By: Augusto Gamble M.D.   On: 04/13/2013 14:12   Ct Cervical Spine Wo Contrast  04/13/2013   CLINICAL DATA:  68 year old male status post fall. Lethargy. Initial encounter.  EXAM: CT HEAD WITHOUT CONTRAST  CT CERVICAL SPINE WITHOUT CONTRAST  TECHNIQUE: Multidetector CT imaging of the head and cervical spine was performed following the standard protocol without intravenous contrast. Multiplanar CT image reconstructions of the cervical spine were also generated.  COMPARISON:  Head CTs without contrast 05/07/2012 and earlier.  FINDINGS: CT HEAD FINDINGS  Advanced calcified atherosclerosis noted diffusely. No acute scalp or orbits soft tissue abnormality identified. Increased widespread paranasal sinus mucosal thickening and opacification. Chronic bubbly opacity in the left sphenoid sinus. Stable mastoids and tympanic cavities. No skull fracture identified.  Stable cerebral volume. No ventriculomegaly. No midline shift, mass effect, or evidence of intracranial mass lesion. No acute intracranial hemorrhage identified. Stable nonspecific cerebral white matter hypodensity. No evidence of cortically based acute infarction identified. No suspicious intracranial vascular  hyperdensity.  CT CERVICAL SPINE FINDINGS  Straightening of cervical lordosis. Visualized skull base is intact. No atlanto-occipital dissociation. No acute cervical spine fracture identified. Retropharyngeal course of both carotid arteries with calcified plaque.  IMPRESSION: 1. Nonspecific cerebral white matter changes. No acute intracranial abnormality. 2. No acute fracture or listhesis identified in the cervical spine. Ligamentous injury is not excluded. 3. Advanced diffuse calcified atherosclerosis suggesting chronic renal disease/renal failure. 4. Increased paranasal sinus inflammation.   Electronically Signed   By: Augusto Gamble M.D.   On: 04/13/2013 14:12    EKG:  Ventricular Rate: 85  PR Interval: 302  QRS Duration: 161  QT Interval: 466  QTC Calculation: 554  R Axis: 150  Text Interpretation: Sinus sinus rhythm, mild prolongation of QT (unchanged from previous tracings); no acute ischemic abnormalities   Assessment/Plan 1-acute on chronic arrest failure: Appear to be secondary to encephalopathy due to 2 non-intentional opioid overdose. Patient initially was in need of BiPAP to maintain good oxygen saturation. After Narcan was given patient has been able to protect his airways abdomen pain good oxygen saturation on his chronic 3 L of oxygen supplementation through nasal cannula. -Will admit to step down bed for close monitoring of his vital signs and breathing status -Continue oxygen supplementation -Avoid any narcotics or medications that can alter his sensorium -Renal service has been contacted for hemodialysis either tonight or early tomorrow (patient on T-T-S schedule). -will check TSH and ETOH level to be thorough -troponin neg, no CP and no acute ischemic changes on EKG.   2-acute encephalopathy: As mentioned above secondary to opioid abuse. No suicidal attempt or intentional overdose.  -Patient nave to opiates recently started for chronic pain and neuropathy. -Apparently has been  taking a little bit more to try to control his pain and due to ESRD experienced accumulative effect  3-ESRD (end stage renal disease) on dialysis: renal service contacted for continuation of HD  4-DM2 (diabetes mellitus, type 2): Will start sliding scale insulin and continue Lantus (Just 20 units twice a day) -Low carbohydrate diet  -will check A1C  5-Opioid overdose: Non-intentional as mentioned above. Will hold any narcotic medications at this point.   6-Chronic pain and neuropathy: hold narcotics. Continue neurontin and ropinirole  7-HTN (hypertension): with hx of orthostatic hypotension (on midodrine). -Currently elevated -continue hydralazine and metoprolol -Hemodialysis to help also control his blood pressure.  8-Chronic respiratory failure: On oxygen supplementation and secondary to COPD. -Currently no wheezing and with good air  movement. -Continue PRN albuterol  9-COPD (chronic obstructive pulmonary disease): No medications for maintenance therapy at home. -Continue as needed albuterol nebulizer treatments. -At this moment condition is stable.  -Continue oxygen supplementation   10-GERD (gastroesophageal reflux disease): Continue PPI    11-secondary hyperparathyroidism: Continue PhosLo and Sensipar   12-glaucoma: Continue home eyedrops regimen  13-hyperlipidemia: Continue statins. Will check lipid profile in the morning.  14-generalized weakness and physical deconditioning: Physical therapy has been consulted for evaluation and treatment.  DVT: Heparin  Nephrology (Dr. Briant Cedar; called by ED)  Code Status: Full Family Communication: no family at bedside Disposition Plan: And need to step down bed, length of stay more than 2 midnights; inpatient status  Time spent: 50 minutes  Vassie Loll [facility] Pager [phone]

## 2013-04-13 NOTE — ED Notes (Signed)
Report given.

## 2013-04-14 ENCOUNTER — Inpatient Hospital Stay (HOSPITAL_COMMUNITY): Payer: Medicare Other

## 2013-04-14 DIAGNOSIS — T84011A Broken internal left hip prosthesis, initial encounter: Secondary | ICD-10-CM | POA: Insufficient documentation

## 2013-04-14 DIAGNOSIS — T84019A Broken internal joint prosthesis, unspecified site, initial encounter: Secondary | ICD-10-CM

## 2013-04-14 DIAGNOSIS — G4733 Obstructive sleep apnea (adult) (pediatric): Secondary | ICD-10-CM

## 2013-04-14 DIAGNOSIS — E662 Morbid (severe) obesity with alveolar hypoventilation: Secondary | ICD-10-CM | POA: Diagnosis present

## 2013-04-14 LAB — COMPREHENSIVE METABOLIC PANEL
ALT: 10 U/L (ref 0–53)
AST: 7 U/L (ref 0–37)
Albumin: 3.2 g/dL — ABNORMAL LOW (ref 3.5–5.2)
Alkaline Phosphatase: 54 U/L (ref 39–117)
BUN: 56 mg/dL — ABNORMAL HIGH (ref 6–23)
CO2: 24 meq/L (ref 19–32)
Calcium: 10.5 mg/dL (ref 8.4–10.5)
Chloride: 96 mEq/L (ref 96–112)
Creatinine, Ser: 10.65 mg/dL — ABNORMAL HIGH (ref 0.50–1.35)
GFR calc Af Amer: 5 mL/min — ABNORMAL LOW (ref >90)
GFR calc non Af Amer: 4 mL/min — ABNORMAL LOW (ref >90)
Glucose, Bld: 203 mg/dL — ABNORMAL HIGH (ref 70–99)
Potassium: 5.2 mEq/L (ref 3.7–5.3)
SODIUM: 139 meq/L (ref 137–147)
Total Bilirubin: 0.4 mg/dL (ref 0.3–1.2)
Total Protein: 6.5 g/dL (ref 6.0–8.3)

## 2013-04-14 LAB — LIPID PANEL
CHOL/HDL RATIO: 3.3 ratio
CHOLESTEROL: 91 mg/dL (ref 0–200)
HDL: 28 mg/dL — AB (ref >39)
LDL Cholesterol: 38 mg/dL (ref 0–99)
TRIGLYCERIDES: 127 mg/dL (ref <150)
VLDL: 25 mg/dL (ref 0–40)

## 2013-04-14 LAB — BLOOD GAS, ARTERIAL
ACID-BASE DEFICIT: 1.5 mmol/L (ref 0.0–2.0)
BICARBONATE: 24.6 meq/L — AB (ref 20.0–24.0)
Drawn by: 358491
FIO2: 0.32 %
O2 Saturation: 92.9 %
PCO2 ART: 55.9 mmHg — AB (ref 35.0–45.0)
PO2 ART: 73.3 mmHg — AB (ref 80.0–100.0)
Patient temperature: 98.6
TCO2: 26.3 mmol/L (ref 0–100)
pH, Arterial: 7.266 — ABNORMAL LOW (ref 7.350–7.450)

## 2013-04-14 LAB — GLUCOSE, CAPILLARY
GLUCOSE-CAPILLARY: 122 mg/dL — AB (ref 70–99)
GLUCOSE-CAPILLARY: 106 mg/dL — AB (ref 70–99)
Glucose-Capillary: 158 mg/dL — ABNORMAL HIGH (ref 70–99)

## 2013-04-14 MED ORDER — GABAPENTIN 100 MG PO CAPS
200.0000 mg | ORAL_CAPSULE | Freq: Three times a day (TID) | ORAL | Status: DC
  Administered 2013-04-15 (×2): 200 mg via ORAL
  Filled 2013-04-14 (×6): qty 2

## 2013-04-14 MED ORDER — TRAMADOL HCL 50 MG PO TABS
50.0000 mg | ORAL_TABLET | Freq: Four times a day (QID) | ORAL | Status: DC | PRN
  Administered 2013-04-15 – 2013-04-16 (×2): 50 mg via ORAL
  Filled 2013-04-14 (×2): qty 1

## 2013-04-14 NOTE — Procedures (Signed)
Pt seen on HD.  Remains on Bi Pap.  Opened eyes briefly.  On 2K2Ca bath.  Will get BCx2.

## 2013-04-14 NOTE — Progress Notes (Signed)
PT Cancellation Note  Patient Details Name: DETERRIUS HEINS MRN: [id number] DOB: Nov 11, 1945   Cancelled Treatment:    Reason Eval/Treat Not Completed: Patient not medically ready. RN deferred at this time due to just placing pt on BiPap and pt with increased lethargy. PT to return as able/when appropriate.   [name] [name] 04/14/2013, 11:19 AM Lewis Shock, PT, DPT Pager #: [phone] Office #: [phone]

## 2013-04-14 NOTE — Progress Notes (Addendum)
   CARE MANAGEMENT NOTE 04/14/2013  Patient:  [name], [name]   Account Number:  [id number]  Date Initiated:  04/14/2013  Documentation initiated by:  [facility]  Subjective/Objective Assessment:   Acute on chronic respiratory failure, encephalopathy (do to non-intentional opiate overdose)     Action/Plan:   home oxygen 3 L    Anticipated DC Date:     Anticipated DC Plan:  SKILLED NURSING FACILITY  In-house referral  Clinical Social Worker      DC Associate Professor  CM consult      Choice offered to / List presented to:             Status of service:  Completed, signed off Medicare Important Message given?   (If response is "NO", the following Medicare IM given date fields will be blank) Date Medicare IM given:   Date Additional Medicare IM given:    Discharge Disposition:    Per UR Regulation:    If discussed at Long Length of Stay Meetings, dates discussed:    Comments:  04/14/2013 1345 NCM spoke to wife, Jamesetta So. States she would prefer SNF placement post dc. States she has wheelchair, powerwheelchair, RW and hoyer lift at home. She receives 2 hours 4x per week aide services through the [place]. CSW referral for possible SNF placement. Isidoro Donning RN CCM Case Mgmt phone [phone]  04/14/2013 1215 UR completed. NCM will continue to follow for dc needs. Will assess for [facility] needs. Pt may benefit for [facility] RN for disease mgmt.  Isidoro Donning RN CCM Case Mgmt phone [phone]

## 2013-04-14 NOTE — Consult Note (Signed)
[facility] Renal Consultation Note  Indication for Consultation:  Management of ESRD/hemodialysis; anemia, hypertension/volume and secondary hyperparathyroidism  HPI: [name] is a 68 y.o. male with a history of COPD on supplemental oxygen at home, Type 2 Diabetes, hypertension, obesity, and ESRD on dialysis on TTS at the [facility] who reportedly fell from his wheelchair yesterday at home and notified his wife before becoming very lethargic and losing consciousness.  His wife, who provided the history, states that he has been taking Oxycontin for left hip pain and may have taken too much.  He was transferred to [facility], where ABGs showed mild acidosis and hypercapnia, and he responded intermittently to Narcan, but is currently lethargic, unable to arouse, and on 3 L of oxygen with saturation of 97%.   CT of the head and cervical spine showed no acute abnormality, and chest x-ray showed no edema, but he will receive his regularly scheduled dialysis today.  Dialysis Orders:   TTS @ [facility] 4:15        112.5 kg        2K/2Ca        450/800      Profile 4        Heparin 8000 U     L IJ catheter    No Hectorol           Epogen 1000 U        Venofer 50 mg on Thurs  Past Medical History  Diagnosis Date  . CHF (congestive heart failure)   . CAD (coronary artery disease) 2006  . COPD (chronic obstructive pulmonary disease)   . Hyperlipidemia   . ESRD (end stage renal disease) on dialysis started 08/2006    MTTS [place]; "getting ready to have it on TTS" (05/07/2012)  . Type II diabetes mellitus     etiology of ESRD  . Diabetic blindness   . Hypertension   . MI, old 2006  . Asthma   . Pneumonia     "everytime I take a cold" (05/07/2012)  . Bronchiectasis   . Shortness of breath     "can happen at anytime" (05/07/2012)  . GERD (gastroesophageal reflux disease)   . Stroke 2006    "numb on left side since"  05/07/2012  . On home oxygen therapy     "3L at night mostly;  also prn" (05/07/2012)  . Cardiomegaly    Past Surgical History  Procedure Laterality Date  . Av fistula placement Right 09/2006    "used it once" (05/07/2012)  . Appendectomy  ~ 1958  . Insertion of dialysis catheter Left 09/2006    "chest" (05/07/2012)  . Total hip arthroplasty Left 1997  . Repair ankle ligament Left 1981  . Cataract extraction w/ intraocular lens implant Left ?2012   Family History  Problem Relation Age of Onset  . Diabetes Mellitus II Father    Social History He quit smoking cigarettes about 30 years ago, but used as much as three packs a day. He quit alcohol nearly 40 years ago, but never used illicit drugs.  Allergies  Allergen Reactions  . Cefepime Itching  . Penicillins Hives and Swelling   Prior to Admission medications   Medication Sig Start Date End Date Taking? Authorizing Provider  albuterol (PROVENTIL HFA;VENTOLIN HFA) 108 (90 BASE) MCG/ACT inhaler Inhale into the lungs every 6 (six) hours as needed for wheezing or shortness of breath.   Yes Historical Provider, MD  albuterol (PROVENTIL) (2.5 MG/3ML) 0.083%  nebulizer solution Take 2.5 mg by nebulization every 6 (six) hours as needed for wheezing or shortness of breath.   Yes Historical Provider, MD  aspirin 325 MG tablet Take 1 tablet (325 mg total) by mouth daily. 09/08/12  Yes Kinnie Feil, MD  beclomethasone (QVAR) 40 MCG/ACT inhaler Inhale 1 puff into the lungs 2 (two) times daily.   Yes Historical Provider, MD  brimonidine (ALPHAGAN) 0.2 % ophthalmic solution Place 1 drop into both eyes 2 (two) times daily.   Yes Historical Provider, MD  calcium acetate (PHOSLO) 667 MG capsule Take 3,335 mg by mouth 3 (three) times daily with meals.   Yes Historical Provider, MD  calcium-vitamin D (OSCAL WITH D) 500-200 MG-UNIT per tablet Take 1 tablet by mouth.   Yes Historical Provider, MD  carboxymethylcellulose (REFRESH CELLUVISC) 1 % ophthalmic solution Place 1 drop into both eyes every 2 (two) hours.   Yes  Historical Provider, MD  cinacalcet (SENSIPAR) 60 MG tablet Take 60 mg by mouth daily.   Yes Historical Provider, MD  clopidogrel (PLAVIX) 75 MG tablet Take 75 mg by mouth daily with breakfast.   Yes Historical Provider, MD  cyanocobalamin 500 MCG tablet Take 1,000 mcg by mouth daily.   Yes Historical Provider, MD  folic acid-vitamin b complex-vitamin c-selenium-zinc (DIALYVITE) 3 MG TABS Take 1 tablet by mouth daily.   Yes Historical Provider, MD  gabapentin (NEURONTIN) 100 MG capsule Take 100 mg by mouth at bedtime as needed (for nerve pain).    Yes Historical Provider, MD  hydrALAZINE (APRESOLINE) 10 MG tablet Take 10 mg by mouth 3 (three) times daily.   Yes Historical Provider, MD  insulin aspart (NOVOLOG) 100 UNIT/ML injection Inject 20-30 Units into the skin 3 (three) times daily with meals. Per sliding scale.   Yes Historical Provider, MD  insulin glargine (LANTUS) 100 UNIT/ML injection Inject 35 Units into the skin 2 (two) times daily.   Yes Historical Provider, MD  metoprolol (LOPRESSOR) 50 MG tablet Take 50 mg by mouth 2 (two) times daily.   Yes Historical Provider, MD  omeprazole (PRILOSEC) 20 MG capsule Take 20 mg by mouth 2 (two) times daily.    Yes Historical Provider, MD  oxycodone (OXY-IR) 5 MG capsule Take 10 mg by mouth every 6 (six) hours as needed for pain.    Yes Historical Provider, MD  pravastatin (PRAVACHOL) 40 MG tablet Take 40 mg by mouth at bedtime.    Yes Historical Provider, MD  rOPINIRole (REQUIP) 0.25 MG tablet Take 0.5 mg by mouth at bedtime. 1 tablet twice a day and 2 tablets at bedtime.   Yes Historical Provider, MD  sennosides-docusate sodium (SENOKOT-S) 8.6-50 MG tablet Take 1 tablet by mouth 2 (two) times daily as needed for constipation. For constipation   Yes Historical Provider, MD   Labs:  Results for orders placed during the hospital encounter of 04/13/13 (from the past 48 hour(s))  CBC WITH DIFFERENTIAL     Status: Abnormal   Collection Time    04/13/13  11:55 AM      Result Value Ref Range   WBC 7.9  4.0 - 10.5 K/uL   RBC 3.75 (*) 4.22 - 5.81 MIL/uL   Hemoglobin 11.9 (*) 13.0 - 17.0 g/dL   HCT 36.4 (*) 39.0 - 52.0 %   MCV 97.1  78.0 - 100.0 fL   MCH 31.7  26.0 - 34.0 pg   MCHC 32.7  30.0 - 36.0 g/dL   RDW 12.4  11.5 -  15.5 %   Platelets 210  150 - 400 K/uL   Neutrophils Relative % 62  43 - 77 %   Neutro Abs 4.9  1.7 - 7.7 K/uL   Lymphocytes Relative 21  12 - 46 %   Lymphs Abs 1.7  0.7 - 4.0 K/uL   Monocytes Relative 13 (*) 3 - 12 %   Monocytes Absolute 1.1 (*) 0.1 - 1.0 K/uL   Eosinophils Relative 3  0 - 5 %   Eosinophils Absolute 0.2  0.0 - 0.7 K/uL   Basophils Relative 1  0 - 1 %   Basophils Absolute 0.1  0.0 - 0.1 K/uL  COMPREHENSIVE METABOLIC PANEL     Status: Abnormal   Collection Time    04/13/13 11:55 AM      Result Value Ref Range   Sodium 141  137 - 147 mEq/L   Potassium 5.1  3.7 - 5.3 mEq/L   Chloride 97  96 - 112 mEq/L   CO2 25  19 - 32 mEq/L   Glucose, Bld 215 (*) 70 - 99 mg/dL   BUN 47 (*) 6 - 23 mg/dL   Creatinine, Ser 9.53 (*) 0.50 - 1.35 mg/dL   Calcium 11.3 (*) 8.4 - 10.5 mg/dL   Total Protein 7.2  6.0 - 8.3 g/dL   Albumin 3.6  3.5 - 5.2 g/dL   AST 9  0 - 37 U/L   ALT 11  0 - 53 U/L   Alkaline Phosphatase 61  39 - 117 U/L   Total Bilirubin 0.4  0.3 - 1.2 mg/dL   GFR calc non Af Amer 5 (*) >90 mL/min   GFR calc Af Amer 6 (*) >90 mL/min   Comment: (NOTE)     The eGFR has been calculated using the CKD EPI equation.     This calculation has not been validated in all clinical situations.     eGFR's persistently <90 mL/min signify possible Chronic Kidney     Disease.  TROPONIN I     Status: None   Collection Time    04/13/13 11:55 AM      Result Value Ref Range   Troponin I <0.30  <0.30 ng/mL   Comment:            Due to the release kinetics of cTnI,     a negative result within the first hours     of the onset of symptoms does not rule out     myocardial infarction with certainty.     If myocardial  infarction is still suspected,     repeat the test at appropriate intervals.  I-STAT CG4 LACTIC ACID, ED     Status: None   Collection Time    04/13/13 12:07 PM      Result Value Ref Range   Lactic Acid, Venous 1.33  0.5 - 2.2 mmol/L  I-STAT CHEM 8, ED     Status: Abnormal   Collection Time    04/13/13 12:07 PM      Result Value Ref Range   Sodium 139  137 - 147 mEq/L   Potassium 4.8  3.7 - 5.3 mEq/L   Chloride 101  96 - 112 mEq/L   BUN 45 (*) 6 - 23 mg/dL   Creatinine, Ser 9.00 (*) 0.50 - 1.35 mg/dL   Glucose, Bld 212 (*) 70 - 99 mg/dL   Calcium, Ion 1.40 (*) 1.13 - 1.30 mmol/L   TCO2 27  0 - 100 mmol/L  Hemoglobin 12.9 (*) 13.0 - 17.0 g/dL   HCT 38.0 (*) 39.0 - 52.0 %  I-STAT ARTERIAL BLOOD GAS, ED     Status: Abnormal   Collection Time    04/13/13 12:19 PM      Result Value Ref Range   pH, Arterial 7.272 (*) 7.350 - 7.450   pCO2 arterial 60.1 (*) 35.0 - 45.0 mmHg   pO2, Arterial 68.0 (*) 80.0 - 100.0 mmHg   Bicarbonate 27.7 (*) 20.0 - 24.0 mEq/L   TCO2 30  0 - 100 mmol/L   O2 Saturation 90.0     Patient temperature 98.6 F     Collection site RADIAL, ALLEN'S TEST ACCEPTABLE     Drawn by Operator     Sample type ARTERIAL     Comment NOTIFIED PHYSICIAN    HEMOGLOBIN A1C     Status: Abnormal   Collection Time    04/13/13  6:21 PM      Result Value Ref Range   Hemoglobin A1C 7.7 (*) <5.7 %   Comment: (NOTE)                                                                               According to the ADA Clinical Practice Recommendations for 2011, when     HbA1c is used as a screening test:      >=6.5%   Diagnostic of Diabetes Mellitus               (if abnormal result is confirmed)     5.7-6.4%   Increased risk of developing Diabetes Mellitus     References:Diagnosis and Classification of Diabetes Mellitus,Diabetes     WUGQ,9169,45(WTUUE 1):S62-S69 and Standards of Medical Care in             Diabetes - 2011,Diabetes Care,2011,34 (Suppl 1):S11-S61.   Mean Plasma  Glucose 174 (*) <117 mg/dL   Comment: Performed at [facility], [facility]     Status: Abnormal   Collection Time    04/13/13  8:07 PM      Result Value Ref Range   Glucose-Capillary 165 (*) 70 - 99 mg/dL  MRSA PCR SCREENING     Status: None   Collection Time    04/13/13  8:28 PM      Result Value Ref Range   MRSA by PCR NEGATIVE  NEGATIVE   Comment:            The GeneXpert MRSA Assay (FDA     approved for NASAL specimens     only), is one component of a     comprehensive MRSA colonization     surveillance program. It is not     intended to diagnose MRSA     infection nor to guide or     monitor treatment for     MRSA infections.  PHOSPHORUS     Status: Abnormal   Collection Time    04/13/13  9:56 PM      Result Value Ref Range   Phosphorus 9.5 (*) 2.3 - 4.6 mg/dL  MAGNESIUM     Status: None   Collection Time    04/13/13  9:56 PM  Result Value Ref Range   Magnesium 1.9  1.5 - 2.5 mg/dL  TSH     Status: None   Collection Time    04/13/13  9:56 PM      Result Value Ref Range   TSH 1.590  0.350 - 4.500 uIU/mL   Comment: Please note change in reference range.  ETHANOL     Status: None   Collection Time    04/13/13  9:56 PM      Result Value Ref Range   Alcohol, Ethyl (B) <11  0 - 11 mg/dL   Comment:            LOWEST DETECTABLE LIMIT FOR     SERUM ALCOHOL IS 11 mg/dL     FOR MEDICAL PURPOSES ONLY  GLUCOSE, CAPILLARY     Status: Abnormal   Collection Time    04/13/13 11:43 PM      Result Value Ref Range   Glucose-Capillary 158 (*) 70 - 99 mg/dL  COMPREHENSIVE METABOLIC PANEL     Status: Abnormal   Collection Time    04/14/13  3:24 AM      Result Value Ref Range   Sodium 139  137 - 147 mEq/L   Potassium 5.2  3.7 - 5.3 mEq/L   Chloride 96  96 - 112 mEq/L   CO2 24  19 - 32 mEq/L   Glucose, Bld 203 (*) 70 - 99 mg/dL   BUN 56 (*) 6 - 23 mg/dL   Creatinine, Ser 10.65 (*) 0.50 - 1.35 mg/dL   Calcium 10.5  8.4 - 10.5 mg/dL   Total Protein 6.5   6.0 - 8.3 g/dL   Albumin 3.2 (*) 3.5 - 5.2 g/dL   AST 7  0 - 37 U/L   ALT 10  0 - 53 U/L   Alkaline Phosphatase 54  39 - 117 U/L   Total Bilirubin 0.4  0.3 - 1.2 mg/dL   GFR calc non Af Amer 4 (*) >90 mL/min   GFR calc Af Amer 5 (*) >90 mL/min   Comment: (NOTE)     The eGFR has been calculated using the CKD EPI equation.     This calculation has not been validated in all clinical situations.     eGFR's persistently <90 mL/min signify possible Chronic Kidney     Disease.  LIPID PANEL     Status: Abnormal   Collection Time    04/14/13  3:24 AM      Result Value Ref Range   Cholesterol 91  0 - 200 mg/dL   Triglycerides 127  <150 mg/dL   HDL 28 (*) >39 mg/dL   Total CHOL/HDL Ratio 3.3     VLDL 25  0 - 40 mg/dL   LDL Cholesterol 38  0 - 99 mg/dL   Comment:            Total Cholesterol/HDL:CHD Risk     Coronary Heart Disease Risk Table                         Men   Women      1/2 Average Risk   3.4   3.3      Average Risk       5.0   4.4      2 X Average Risk   9.6   7.1      3 X Average Risk  23.4   11.0  Use the calculated Patient Ratio     above and the CHD Risk Table     to determine the patient's CHD Risk.                ATP III CLASSIFICATION (LDL):      <100     mg/dL   Optimal      100-129  mg/dL   Near or Above                        Optimal      130-159  mg/dL   Borderline      160-189  mg/dL   High      >190     mg/dL   Very High  GLUCOSE, CAPILLARY     Status: Abnormal   Collection Time    04/14/13  8:23 AM      Result Value Ref Range   Glucose-Capillary 122 (*) 70 - 99 mg/dL   Comment 1 Documented in Chart     Comment 2 Notify RN    BLOOD GAS, ARTERIAL     Status: Abnormal   Collection Time    04/14/13  8:58 AM      Result Value Ref Range   FIO2 0.32     Delivery systems NASAL CANNULA     pH, Arterial 7.266 (*) 7.350 - 7.450   pCO2 arterial 55.9 (*) 35.0 - 45.0 mmHg   pO2, Arterial 73.3 (*) 80.0 - 100.0 mmHg   Bicarbonate 24.6 (*) 20.0 -  24.0 mEq/L   TCO2 26.3  0 - 100 mmol/L   Acid-base deficit 1.5  0.0 - 2.0 mmol/L   O2 Saturation 92.9     Patient temperature 98.6     Collection site RIGHT RADIAL     Drawn by [phone]     Sample type ARTERIAL DRAW     Allens test (pass/fail) PASS  PASS  GLUCOSE, CAPILLARY     Status: Abnormal   Collection Time    04/14/13 10:53 AM      Result Value Ref Range   Glucose-Capillary 106 (*) 70 - 99 mg/dL   Comment 1 Documented in Chart     Comment 2 Notify RN     Review of systems not obtained due to patient factors.  Physical Exam: Filed Vitals:   04/14/13 1123  BP:   Pulse:   Temp: 98.5 F (36.9 C)  Resp:      General appearance: lethargic, on 3 L of oxygen per BiPAP Head: Normocephalic, without obvious abnormality, atraumatic Neck: no adenopathy, no carotid bruit, no JVD and supple, symmetrical, trachea midline Resp: clear to ausculatation without rales, rhonchi, or wheezes Cardio: regular rate and rhythm, S1, S2 normal, no murmur, click, rub or gallop GI: soft, obese, non-tender; bowel sounds normal Extremities: extremities normal, atraumatic, no cyanosis or edema Neurologic: lethargic, unable to arouse Dialysis Access: L IJ catheter   Assessment/Plan: 1. Acute encephalopathy - believed to be sec to non-intentional narcotic overdose, s/p Narcan, on 3 L O2 per BiPAP. 2. ESRD - HD on TTS @ [facility], K 5.2.  HD pending. 3. Hypertension/volume - BP 124/59 on outpatient Midodrine; on hydralazine & metoprolol now; @ EDW, UF goal 3 L today. 4. Anemia - Hgb 12.9 on outpatient Epogen & weekly Fe. 5. Metabolic bone disease - Ca 10.5 (11.1 corrected), last P 6.5, iPTH 497; 2Ca bath, no Hectorol, Sensipar 60 qd, Phoslo 4 with meals. 6. Nutrition - Alb  3.2, currently NPO. 7. DM Type 2 - on insulin. 8. COPD - on supplemental O2 @ home.  Ramiro Harvest 04/14/2013, 1:44 PM  I have seen and examined this patient and agree with plan per Ramiro Harvest.  Has been getting narcotics from the [facility] to tx neuropathic pain.  Wife says he told her he was taking as prescribed unless his pain wasn't controlled and then he would take more.  Currently very somnolent/obtunded and on BiPap.  Hemodynamically stable.  Has Lt PC...afebrile with nl WBC.  Will plan HD tonight.  Mild hypercalcemia so will DC Ca based binder. Bennie Dallas 04/14/2013 3:09 PM Attending Nephrologist: Fleet Contras, MD

## 2013-04-14 NOTE — Progress Notes (Signed)
Inpatient Diabetes Program Recommendations  AACE/ADA: New Consensus Statement on Inpatient Glycemic Control (2013)  Target Ranges:  Prepandial:   less than 140 mg/dL      Peak postprandial:   less than 180 mg/dL (1-2 hours)      Critically ill patients:  140 - 180 mg/dL   Reason for Visit: Hyperglycemia  Diabetes history: DM2 Outpatient Diabetes medications: Lantus 35 units bid and Novolog 20-30 units tidwc for meal coverage Current orders for Inpatient glycemic control: Lantus 20 units bid and Novolog sensitive tidwc  Inpatient Diabetes Program Recommendations Correction (SSI): Increase to moderate tidwc Insulin - Meal Coverage: When diet begins, consider addition of Novolog 3 units tidwc for meal coverage insulin HgbA1C: 7.7% -  Diet: When advanced, CHO mod med renal  Note: Will monitor daily.  Need to titrate Lantus and Novolog as needed. Thank you. Ailene Ards, RD, LDN, CDE Inpatient Diabetes Coordinator [phone]

## 2013-04-14 NOTE — Progress Notes (Signed)
[name]:[id number] DOB: 1945-06-27 DOA: 04/13/2013 PCP: [name], MD  Time spent :  Brief narrative: 68 year old WM PMHx multiple medical problems including fracture of left hip 2 weeks ago unrepaired per wife (same hip S/P THA 1998), chronic kidney disease on dialysis T/Th/Sat, chronic hypoxemic respiratory failure due to COPD on 3 L nasal cannula oxygen at home, sleep apnea noncompliant with CPAP and diabetes. He also has orthostatic hypotension on dialysis days and is on chronic midodrine.  Patient apparently had been experiencing multiple falls and was having difficulty getting up despite assistance from his wife. On the date of admission he was more lethargic with respiratory difficulties and increasing hypoxemia despite his usual oxygen. Recent start by the [name] on OxyIR and wife is concerned patient may have taken more than was as prescribed.  Upon arrival to the ER patient had mild acidosis with hypercapnia and hypoxemia. Patient condition drastically improved with administration of Narcan and use of BiPAP. Upon evaluation in the admitting physician he was still lethargic but easy to arouse so it was opted to admit the patient for further observation  HPI/Subjective: Patient lethargic and essentially obtunded this morning.  Assessment/Plan:   Encephalopathy acute -multifactorial: hypercarbia and narcs -treat underlying causes    Acute and chronic respiratory failure:   A) COPD (chronic obstructive pulmonary disease)   B) OSA/Obesity hypoventilation syndrome -repeat ABG today OFF narcs and other sedating meds with persistent hypercarbia with resp acidosis so BiPAP resumed -pt non adherent with CPAP at home -no wheezing so COPD compensated    ESRD (end stage renal disease) on dialysis -consulted Nephro today -T-Th-Sa usual HD days. -does not appear volume overloaded at this time    Opioid  overdose/Chronic pain -unintentional and due to ongoing left hip pain (see below) -allowing only Ultram for now -seems to have a degree of diabetic neuropathy so have increased Neurontin from 100 mg to 200 mg TID -no narcs for now      Reported [facility] internal left hip prosthesis -THR 1997 in [name] -wife states pt falling- went to [name] and had X[facility] -[facility] and [facility] and area of prosthesis and ? Plan to FU for surgical evaluation -wife says pt falls onto left hip frequently -left hip XR without fracture but was portable and possibly limited data given one view and pt obesity- may need CT to better clarify-if confirmed or if prosthesis issues will consult orthopedics here    DM2 (diabetes mellitus, type 2) -CBGs controlled -NPO due to resp status -cont Lantus and SSI -change CBGs to q 4 hrs    CAD (coronary artery disease)    HTN (hypertension) -issues with orthostasis on HD days and could be contributing to falls -check OVS here (if can wt bear given concerns over left hip fx) -may need to allow permissive HTN which means no anti HTN meds -Echo done last month with preserved LV function but when does an adequate to determine if diastolic dysfunction    GERD (gastroesophageal reflux disease)    DVT prophylaxis: Subcutaneous heparin Code Status: Full Family Communication: Multiple family members at bedside Disposition Plan/Expected LOS: Stepdown   Consultants: Nephrology  Procedures: None  Antibiotics: None  Objective: Blood pressure 129/36, pulse 82, temperature 98.5 F (36.9 C), temperature source Axillary, resp. rate 14, height 6' (1.829 m), weight 248 lb 0.3 oz (112.5 kg), SpO2 99.00%.  Intake/Output Summary (Last 24 hours) at 04/14/13 1254 Last data filed at 04/14/13 0100  Gross per 24 hour  Intake    243 ml  Output      0 ml  Net    243 ml     Exam: General: No acute respiratory distress but is very  lethargic Lungs: Clear to auscultation bilaterally without wheezes or crackles, 3 L-transitioned to BiPAP Cardiovascular: Regular rate and rhythm without murmur gallop or rub normal S1 and S2, no peripheral edema or JVD Abdomen: Nontender, nondistended but is obese, soft, bowel sounds positive, no rebound, no ascites, no appreciable mass Musculoskeletal: No significant cyanosis, clubbing of bilateral lower extremities Neurological: Lethargic and obtunded, unable to awaken  Scheduled Meds:  Scheduled Meds: . artificial tears  1 application Right Eye BID  . aspirin  325 mg Oral Daily  . brimonidine  1 drop Both Eyes BID  . calcium acetate  2,668 mg Oral TID WC  . calcium-vitamin D  1 tablet Oral Q breakfast  . cinacalcet  60 mg Oral BID WC  . gabapentin  200 mg Oral TID  . heparin  5,000 Units Subcutaneous 3 times per day  . hydrALAZINE  10 mg Oral TID  . insulin aspart  0-9 Units Subcutaneous TID WC  . insulin glargine  20 Units Subcutaneous BID  . metoprolol  50 mg Oral BID  . midodrine  5 mg Oral TID WC  . multivitamin  1 tablet Oral QHS  . pantoprazole  40 mg Oral Daily  . rOPINIRole  0.5 mg Oral QHS  . simvastatin  20 mg Oral q1800  . sodium chloride  3 mL Intravenous Q12H  . cyanocobalamin  1,000 mcg Oral Daily   Continuous Infusions:   Data Reviewed: Basic Metabolic Panel:  Recent Labs Lab 04/13/13 1155 04/13/13 1207 04/13/13 2156 04/14/13 0324  NA 141 139  --  139  K 5.1 4.8  --  5.2  CL 97 101  --  96  CO2 25  --   --  24  GLUCOSE 215* 212*  --  203*  BUN 47* 45*  --  56*  CREATININE 9.53* 9.00*  --  10.65*  CALCIUM 11.3*  --   --  10.5  MG  --   --  1.9  --   PHOS  --   --  9.5*  --    Liver Function Tests:  Recent Labs Lab 04/13/13 1155 04/14/13 0324  AST 9 7  ALT 11 10  ALKPHOS 61 54  BILITOT 0.4 0.4  PROT 7.2 6.5  ALBUMIN 3.6 3.2*   No results found for this basename: LIPASE, AMYLASE,  in the last 168 hours No results found for this  basename: AMMONIA,  in the last 168 hours CBC:  Recent Labs Lab 04/13/13 1155 04/13/13 1207  WBC 7.9  --   NEUTROABS 4.9  --   HGB 11.9* 12.9*  HCT 36.4* 38.0*  MCV 97.1  --   PLT 210  --    Cardiac Enzymes:  Recent Labs Lab 04/13/13 1155  TROPONINI <0.30   BNP (last 3 results) No results found for this basename: PROBNP,  in the last 8760 hours CBG:  Recent Labs Lab 04/13/13 2007 04/13/13 2343 04/14/13 0823  GLUCAP 165* 158* 122*    Recent Results (from the past 240 hour(s))  MRSA PCR SCREENING     Status: None   Collection Time    04/13/13  8:28 PM  Result Value Ref Range Status   MRSA by PCR NEGATIVE  NEGATIVE Final   Comment:            The GeneXpert MRSA Assay (FDA     approved for NASAL specimens     only), is one component of a     comprehensive MRSA colonization     surveillance program. It is not     intended to diagnose MRSA     infection nor to guide or     monitor treatment for     MRSA infections.     Studies:  Recent x-ray studies have been reviewed in detail by the Attending Physician       Junious SilkAllison Ellis, ANP [facility] Office  [phone] Pager [phone]  **If unable to reach the above provider after paging please contact the Flow Manager @ [phone]  On-Call/Text Page:      [email or web address]      password [email or web address]  If 7PM-7AM, please contact night-coverage [email or web address] Password [email or web address] 04/14/2013, 12:54 PM   LOS: 1 day  -Examining patient with ANP Junious SilkAllison Ellis and agree with exam, assessment and plan -Family present and discussed at length plan of care and answered all questions

## 2013-04-14 NOTE — Progress Notes (Signed)
Nutrition Brief Note  Patient identified on the [facility] Report  Wt Readings from Last 15 Encounters:  04/13/13 248 lb 0.3 oz (112.5 kg)  08/31/12 254 lb 10.1 oz (115.5 kg)  05/10/12 235 lb 7.2 oz (106.8 kg)  05/01/12 244 lb 11.4 oz (111 kg)  12/12/11 226 lb (102.513 kg)  12/12/11 226 lb (102.513 kg)  11/03/11 229 lb 8 oz (104.1 kg)    Body mass index is 33.63 kg/(m^2). Patient meets criteria for obesity based on current BMI.   Current diet order is NPO due to being places on BiPap. Pt's wife reports that pt has had no recent weight changes, and that his appetite has been very good up until admission. Labs and medications reviewed.   No nutrition interventions warranted at this time. If nutrition issues arise, please consult RD.   Ebbie Latus RD, LDN

## 2013-04-15 DIAGNOSIS — R7881 Bacteremia: Secondary | ICD-10-CM | POA: Insufficient documentation

## 2013-04-15 LAB — GLUCOSE, CAPILLARY
GLUCOSE-CAPILLARY: 91 mg/dL (ref 70–99)
GLUCOSE-CAPILLARY: 177 mg/dL — AB (ref 70–99)
Glucose-Capillary: 123 mg/dL — ABNORMAL HIGH (ref 70–99)
Glucose-Capillary: 66 mg/dL — ABNORMAL LOW (ref 70–99)
Glucose-Capillary: 74 mg/dL (ref 70–99)
Glucose-Capillary: 82 mg/dL (ref 70–99)

## 2013-04-15 LAB — RENAL FUNCTION PANEL
Albumin: 2.9 g/dL — ABNORMAL LOW (ref 3.5–5.2)
BUN: 30 mg/dL — ABNORMAL HIGH (ref 6–23)
CALCIUM: 9.8 mg/dL (ref 8.4–10.5)
CHLORIDE: 94 meq/L — AB (ref 96–112)
CO2: 24 mEq/L (ref 19–32)
Creatinine, Ser: 6.91 mg/dL — ABNORMAL HIGH (ref 0.50–1.35)
GFR calc Af Amer: 9 mL/min — ABNORMAL LOW (ref >90)
GFR calc non Af Amer: 7 mL/min — ABNORMAL LOW (ref >90)
Glucose, Bld: 75 mg/dL (ref 70–99)
Phosphorus: 7 mg/dL — ABNORMAL HIGH (ref 2.3–4.6)
Potassium: 4.3 mEq/L (ref 3.7–5.3)
SODIUM: 139 meq/L (ref 137–147)

## 2013-04-15 MED ORDER — METOPROLOL TARTRATE 25 MG PO TABS
25.0000 mg | ORAL_TABLET | Freq: Two times a day (BID) | ORAL | Status: DC
  Administered 2013-04-15: 25 mg via ORAL
  Filled 2013-04-15 (×6): qty 1

## 2013-04-15 MED ORDER — CLOPIDOGREL BISULFATE 75 MG PO TABS
75.0000 mg | ORAL_TABLET | Freq: Every day | ORAL | Status: DC
  Administered 2013-04-18 – 2013-04-21 (×3): 75 mg via ORAL
  Filled 2013-04-15 (×8): qty 1

## 2013-04-15 MED ORDER — CALCIUM ACETATE 667 MG PO CAPS
3335.0000 mg | ORAL_CAPSULE | Freq: Three times a day (TID) | ORAL | Status: DC
  Filled 2013-04-15 (×7): qty 5

## 2013-04-15 MED ORDER — FLUTICASONE PROPIONATE HFA 44 MCG/ACT IN AERO
1.0000 | INHALATION_SPRAY | Freq: Two times a day (BID) | RESPIRATORY_TRACT | Status: DC
  Administered 2013-04-15 – 2013-04-22 (×9): 1 via RESPIRATORY_TRACT
  Filled 2013-04-15: qty 10.6

## 2013-04-15 MED ORDER — GABAPENTIN 100 MG PO CAPS
100.0000 mg | ORAL_CAPSULE | Freq: Three times a day (TID) | ORAL | Status: DC
  Administered 2013-04-15 – 2013-04-16 (×2): 100 mg via ORAL
  Filled 2013-04-15 (×7): qty 1

## 2013-04-15 MED ORDER — MIDODRINE HCL 5 MG PO TABS
10.0000 mg | ORAL_TABLET | Freq: Three times a day (TID) | ORAL | Status: DC
  Administered 2013-04-15 – 2013-04-18 (×5): 10 mg via ORAL
  Filled 2013-04-15 (×13): qty 2

## 2013-04-15 NOTE — Evaluation (Signed)
Physical Therapy Evaluation Patient Details Name: [name] MRN: [id number] DOB: 02/23/1945 Today's Date: 04/15/2013   History of Present Illness  68 year old WM PMHx multiple medical problems including fracture of left hip 2 weeks ago unrepaired per wife (same hip S/P THA 1998), chronic kidney disease on dialysis T/Th/Sat, chronic hypoxemic respiratory failure due to COPD on 3 L nasal cannula oxygen at home, sleep apnea noncompliant with CPAP and diabetes. He also has orthostatic hypotension on dialysis days and is on chronic midodrine.  Clinical Impression  Pt with noted vision deficits and L gaze preference however can turn head to R with verbal cues. Pt with significant balance impairment. Unclear of L hip condition if it's okay to attempt WBing due to the L hip fx 2 weeks ago. Recommend SNF upon d/c to allow for maximal functional recovery to decrease burden of care on spouse upon transition home.    Follow Up Recommendations SNF;Supervision/Assistance - 24 hour    Equipment Recommendations  None recommended by PT    Recommendations for Other Services OT consult     Precautions / Restrictions Precautions Precautions: Fall Precaution Comments: poor vision, pt reports "I cant see anything out of my left eye" Restrictions Weight Bearing Restrictions: No      Mobility  Bed Mobility Overal bed mobility: Needs Assistance Bed Mobility: Supine to Sit;Sit to Supine     Supine to sit: Max assist;+2 for physical assistance Sit to supine: Max assist;+2 for physical assistance   General bed mobility comments: max directional verbal and tactile cues to complete task.  Transfers                 General transfer comment: not assessed due to known L Hip fx. pt unable to sit up EOB without maximal assist  Ambulation/Gait                Stairs            Wheelchair Mobility    Modified Rankin (Stroke Patients Only)       Balance Overall balance  assessment: Needs assistance Sitting-balance support: Feet supported;Bilateral upper extremity supported Sitting balance-Leahy Scale: Zero Sitting balance - Comments: pt consistantly falling to L and unable to self correct and reports he falling to the R. Postural control: Posterior lean;Left lateral lean                                   Pertinent Vitals/Pain Denies pain    Home Living Family/patient expects to be discharged to:: Private residence Living Arrangements: Spouse/significant other;Children Available Help at Discharge: Family;Available 24 hours/day Type of Home: House Home Access: Ramped entrance     Home Layout: One level Home Equipment: Walker - 2 wheels;Bedside commode;Wheelchair - IT trainermanual;Electric scooter      Prior Function Level of Independence: Needs assistance   Gait / Transfers Assistance Needed: pt reports of minimal ambulation in over a year  ADL's / Homemaking Assistance Needed: pt reports assist with bathing/dressing  Comments: pt with difficulty staying focused, unclear of accuracy of report of PLOF     Hand Dominance   Dominant Hand: Right    Extremity/Trunk Assessment   Upper Extremity Assessment: Generalized weakness (pt moved bilat UEs equally)           Lower Extremity Assessment: Generalized weakness         Communication      Cognition Arousal/Alertness: Awake/alert Behavior During Therapy:  WFL for tasks assessed/performed Overall Cognitive Status: Impaired/Different from baseline Area of Impairment: Attention;Following commands;Awareness;Safety/judgement   Current Attention Level: Sustained   Following Commands: Follows one step commands with increased time Safety/Judgement: Decreased awareness of safety;Decreased awareness of deficits Awareness: Intellectual        General Comments General comments (skin integrity, edema, etc.): Pt unable to find utensils to feed self, pt vision improved when L eye is  covered however unable to sequence L UE to feed self    Exercises        Assessment/Plan    PT Assessment Patient needs continued PT services  PT Diagnosis Difficulty walking;Acute pain   PT Problem List Decreased strength;Decreased activity tolerance;Decreased balance;Decreased mobility;Decreased coordination;Decreased cognition;Decreased safety awareness  PT Treatment Interventions DME instruction;Functional mobility training;Therapeutic activities;Therapeutic exercise;Balance training   PT Goals (Current goals can be found in the Care Plan section) Acute Rehab PT Goals PT Goal Formulation: With patient Time For Goal Achievement: 04/29/13 Potential to Achieve Goals: Good    Frequency Min 3X/week   Barriers to discharge        Co-evaluation               End of Session Equipment Utilized During Treatment: Oxygen Activity Tolerance: Patient limited by fatigue Patient left: in bed;with call bell/phone within reach Nurse Communication: Mobility status (needs assist with feeding self)         Time: 1448-1856 PT Time Calculation (min): 24 min   Charges:   PT Evaluation $Initial PT Evaluation Tier I: 1 Procedure PT Treatments $Therapeutic Activity: 8-22 mins   PT G Codes:          Georga Stys M Saquan Furtick 04/15/2013, 11:24 AM  Lewis Shock, PT, DPT Pager #: [phone] Office #: [phone]

## 2013-04-15 NOTE — Progress Notes (Signed)
S: Wants to eat.   O:BP 101/62  Pulse 98  Temp(Src) 98.8 F (37.1 C) (Oral)  Resp 14  Ht 6' (1.829 m)  Wt 112.1 kg (247 lb 2.2 oz)  BMI 33.51 kg/m2  SpO2 96%  Intake/Output Summary (Last 24 hours) at 04/15/13 0752 Last data filed at 04/14/13 1939  Gross per 24 hour  Intake      0 ml  Output   2081 ml  Net  -2081 ml   Weight change: -0.4 kg (-14.1 oz) ZOX:WRUEAGen:awake and alert CVS:RRR Resp:Scattered wheezes, mostly from upper airways Abd:+ BS ND, soft.  Mild tenderness to deep palpation Ext: no edema NEURO:CNI Ox3 + asterixis Lt IJ permcath   . artificial tears  1 application Right Eye BID  . aspirin  325 mg Oral Daily  . brimonidine  1 drop Both Eyes BID  . cinacalcet  60 mg Oral BID WC  . gabapentin  200 mg Oral TID  . heparin  5,000 Units Subcutaneous 3 times per day  . hydrALAZINE  10 mg Oral TID  . insulin aspart  0-9 Units Subcutaneous TID WC  . insulin glargine  20 Units Subcutaneous BID  . metoprolol  50 mg Oral BID  . midodrine  5 mg Oral TID WC  . multivitamin  1 tablet Oral QHS  . pantoprazole  40 mg Oral Daily  . rOPINIRole  0.5 mg Oral QHS  . simvastatin  20 mg Oral q1800  . sodium chloride  3 mL Intravenous Q12H  . cyanocobalamin  1,000 mcg Oral Daily   Dg Chest 2 View  04/13/2013   CLINICAL DATA:  Cough, shortness of Breath  EXAM: CHEST  2 VIEW  COMPARISON:  09/25/2012  FINDINGS: Cardiomegaly again noted. Stable left IJ dialysis catheter. Again noted calcified structure adjacent to aortic arch stable from prior exam. No acute infiltrate or pleural effusion. No pulmonary edema. Mild degenerative changes thoracic spine.  IMPRESSION: No active cardiopulmonary disease.   Electronically Signed   By: Natasha MeadLiviu  Pop M.D.   On: 04/13/2013 12:58   Ct Head Wo Contrast  04/13/2013   CLINICAL DATA:  68 year old male status post fall. Lethargy. Initial encounter.  EXAM: CT HEAD WITHOUT CONTRAST  CT CERVICAL SPINE WITHOUT CONTRAST  TECHNIQUE: Multidetector CT imaging of the  head and cervical spine was performed following the standard protocol without intravenous contrast. Multiplanar CT image reconstructions of the cervical spine were also generated.  COMPARISON:  Head CTs without contrast 05/07/2012 and earlier.  FINDINGS: CT HEAD FINDINGS  Advanced calcified atherosclerosis noted diffusely. No acute scalp or orbits soft tissue abnormality identified. Increased widespread paranasal sinus mucosal thickening and opacification. Chronic bubbly opacity in the left sphenoid sinus. Stable mastoids and tympanic cavities. No skull fracture identified.  Stable cerebral volume. No ventriculomegaly. No midline shift, mass effect, or evidence of intracranial mass lesion. No acute intracranial hemorrhage identified. Stable nonspecific cerebral white matter hypodensity. No evidence of cortically based acute infarction identified. No suspicious intracranial vascular hyperdensity.  CT CERVICAL SPINE FINDINGS  Straightening of cervical lordosis. Visualized skull base is intact. No atlanto-occipital dissociation. No acute cervical spine fracture identified. Retropharyngeal course of both carotid arteries with calcified plaque.  IMPRESSION: 1. Nonspecific cerebral white matter changes. No acute intracranial abnormality. 2. No acute fracture or listhesis identified in the cervical spine. Ligamentous injury is not excluded. 3. Advanced diffuse calcified atherosclerosis suggesting chronic renal disease/renal failure. 4. Increased paranasal sinus inflammation.   Electronically Signed   By: Augusto GambleLee  Hall  M.D.   On: 04/13/2013 14:12   Ct Cervical Spine Wo Contrast  04/13/2013   CLINICAL DATA:  68 year old male status post fall. Lethargy. Initial encounter.  EXAM: CT HEAD WITHOUT CONTRAST  CT CERVICAL SPINE WITHOUT CONTRAST  TECHNIQUE: Multidetector CT imaging of the head and cervical spine was performed following the standard protocol without intravenous contrast. Multiplanar CT image reconstructions of the  cervical spine were also generated.  COMPARISON:  Head CTs without contrast 05/07/2012 and earlier.  FINDINGS: CT HEAD FINDINGS  Advanced calcified atherosclerosis noted diffusely. No acute scalp or orbits soft tissue abnormality identified. Increased widespread paranasal sinus mucosal thickening and opacification. Chronic bubbly opacity in the left sphenoid sinus. Stable mastoids and tympanic cavities. No skull fracture identified.  Stable cerebral volume. No ventriculomegaly. No midline shift, mass effect, or evidence of intracranial mass lesion. No acute intracranial hemorrhage identified. Stable nonspecific cerebral white matter hypodensity. No evidence of cortically based acute infarction identified. No suspicious intracranial vascular hyperdensity.  CT CERVICAL SPINE FINDINGS  Straightening of cervical lordosis. Visualized skull base is intact. No atlanto-occipital dissociation. No acute cervical spine fracture identified. Retropharyngeal course of both carotid arteries with calcified plaque.  IMPRESSION: 1. Nonspecific cerebral white matter changes. No acute intracranial abnormality. 2. No acute fracture or listhesis identified in the cervical spine. Ligamentous injury is not excluded. 3. Advanced diffuse calcified atherosclerosis suggesting chronic renal disease/renal failure. 4. Increased paranasal sinus inflammation.   Electronically Signed   By: Augusto Gamble M.D.   On: 04/13/2013 14:12   Dg Hip Portable 1 View Left  04/14/2013   CLINICAL DATA:  Known prosthesis. [facility] films at [facility] demonstrating fracture.  EXAM: PORTABLE LEFT HIP - 1 VIEW  COMPARISON:  02/14/2013  FINDINGS: There is a left total hip arthroplasty intact and normally located. This is unchanged. There is no acute fracture or dislocation. Moderate arteriosclerosis is present.  IMPRESSION: Total left hip arthroplasty intact and unchanged. No acute findings.   Electronically Signed   By: Elberta Fortis M.D.   On: 04/14/2013 11:58   BMET     Component Value Date/Time   NA 139 04/15/2013 0245   K 4.3 04/15/2013 0245   CL 94* 04/15/2013 0245   CO2 24 04/15/2013 0245   GLUCOSE 75 04/15/2013 0245   BUN 30* 04/15/2013 0245   CREATININE 6.91* 04/15/2013 0245   CALCIUM 9.8 04/15/2013 0245   CALCIUM 10.2 08/31/2009 0850   GFRNONAA 7* 04/15/2013 0245   GFRAA 9* 04/15/2013 0245   CBC    Component Value Date/Time   WBC 7.9 04/13/2013 1155   RBC 3.75* 04/13/2013 1155   HGB 12.9* 04/13/2013 1207   HCT 38.0* 04/13/2013 1207   PLT 210 04/13/2013 1155   MCV 97.1 04/13/2013 1155   MCH 31.7 04/13/2013 1155   MCHC 32.7 04/13/2013 1155   RDW 12.4 04/13/2013 1155   LYMPHSABS 1.7 04/13/2013 1155   MONOABS 1.1* 04/13/2013 1155   EOSABS 0.2 04/13/2013 1155   BASOSABS 0.1 04/13/2013 1155     Assessment: 1. Hypercapneic resp failure poss sec to narcotics, improved 2. Mild hypercalcemia, improved 3. 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 DM 5. COPD 6. ESRD 7. Anemia  EPO on hold Plan: 1. Renal diet 2. HD tomorrow 3.  Increase midodrine to 10mg  tid 4. DC hydralazine.  Not clear why hydralazine and metoprolol when BP so low he requires midodrine   Dyke Maes

## 2013-04-15 NOTE — Progress Notes (Signed)
[name]  [name] ZOX:[id number] DOB: 09/22/1945 DOA: 04/13/2013 PCP: Todd LigasPATEL,JAY K., MD  Time spent :  35 minutes  Brief narrative: 68 year old M PMHx multiple medical problems including "fracture" of left hip 2 weeks ago unrepaired per wife (same hip S/P THA 1998), chronic kidney disease on dialysis T/Th/Sat, chronic hypoxemic respiratory failure due to COPD on 3 L nasal cannula oxygen at home, sleep apnea noncompliant with CPAP and diabetes. He also has orthostatic hypotension on dialysis days and is on chronic midodrine.  Patient apparently had been experiencing multiple falls and was having difficulty getting up despite assistance from his wife. On the date of admission he was more lethargic with respiratory difficulties and increasing hypoxemia despite his usual oxygen. [facility] and wife was concerned patient may have taken more than was prescribed.  Upon arrival to the ER patient had mild acidosis with hypercapnia and hypoxemia. Patient condition drastically improved with administration of Narcan and use of BiPAP.   HPI/Subjective: Patient awake but clearly confused - restless in bed - will not follow commands or accept redirection.  Assessment/Plan:    Encephalopathy acute -Now more alert but still quite confused -suspect multifactorial: hypercarbia and narcs -treat underlying causes and follow    Acute and chronic respiratory failure:   A) Baseline COPD (chronic obstructive pulmonary disease)   B) OSA/Obesity hypoventilation syndrome -repeat ABG 4/14 OFF narcs and other sedating meds: persistent hypercarbia with resp acidosis so BiPAP resumed - successfully weaned to 3L by am of 4/15 -pt non adherent with CPAP at home -no wheezing so COPD compensated   ? GPC Bacteremia -1/2 bottles positive -likely a contaminant but since HD pt does have risk factors for true bacteremia - followup speciation   End stage renal disease on dialysis -consulted Nephro  -T-Th-Sa usual HD days. -does not appear volume overloaded at this time    Opioid overdose / Chronic pain -unintentional and due to ongoing left hip pain (see below) -allowing only Ultram for now -seems to have a degree of diabetic neuropathy so continue Neurontin TID cautiously -no narcs for now      Reported Broken left hip prosthesis -THR 1997 in [place] -wife states pt falling- [facility] and [facility] -[facility] says [facility] and stated [facility] had hip fracture at area of prosthesis and ?plan to FU for eventual surgical evaluation -wife says pt falls onto left hip frequently -left hip XR here without fracture but was portable and possibly limited data given one view and pt obesity -obtain records from [facility]- if not yet done will need CT hip to clarify- if fx detected will need ortho eval while here -per family pt had limited mobility post hip repair in 1997 progressive worsening of mobility - by 2006 essentially WC bound    DM2  -CBGs controlled -NPO due to resp status -cont Lantus and SSI -change CBGs to q 4 hrs    CAD     HTN -issues with orthostasis on HD days could be contributing to falls -may need to allow permissive HTN which means no anti HTN meds -Nephro has dc'd hydralazine - may need to taper and dc BB as well -Echo done last month with preserved LV function     GERD   DVT prophylaxis: Subcutaneous heparin Code Status: Full Family Communication: Multiple family members at bedside Disposition Plan/Expected LOS: Stepdown -PT recommends SNF but pt with  ACUTE encephalopathy so likely will need add'l eval later  Consultants: Nephrology  Procedures: None  Antibiotics: None  Objective: Blood pressure 98/28, pulse 97, temperature 98.1 F (36.7 C), temperature source Oral, resp. rate 16, height 6' (1.829 m), weight 247 lb 2.2 oz (112.1 kg), SpO2 94.00%.  Intake/Output Summary (Last 24 hours) at 04/15/13  1432 Last data filed at 04/15/13 1300  Gross per 24 hour  Intake    243 ml  Output   2081 ml  Net  -1838 ml   Exam: General: No acute respiratory distress but is confused Lungs: Very distant breath sounds in all fields with no focal crackles or wheeze Cardiovascular: Regular rate and rhythm without murmur gallop or rub normal S1 and S2, no peripheral edema - distant heart sounds Abdomen: Nontender, nondistended but is obese, soft, bowel sounds positive, no rebound, no ascites, no appreciable mass Musculoskeletal: No significant cyanosis, clubbing of bilateral lower extremities Neurological: Awake but confused and restless  Scheduled Meds:  Scheduled Meds: . artificial tears  1 application Right Eye BID  . aspirin  325 mg Oral Daily  . brimonidine  1 drop Both Eyes BID  . cinacalcet  60 mg Oral BID WC  . gabapentin  200 mg Oral TID  . heparin  5,000 Units Subcutaneous 3 times per day  . insulin aspart  0-9 Units Subcutaneous TID WC  . insulin glargine  20 Units Subcutaneous BID  . metoprolol  50 mg Oral BID  . midodrine  10 mg Oral TID WC  . multivitamin  1 tablet Oral QHS  . pantoprazole  40 mg Oral Daily  . rOPINIRole  0.5 mg Oral QHS  . simvastatin  20 mg Oral q1800  . sodium chloride  3 mL Intravenous Q12H  . cyanocobalamin  1,000 mcg Oral Daily   Data Reviewed: Basic Metabolic Panel:  Recent Labs Lab 04/13/13 1155 04/13/13 1207 04/13/13 2156 04/14/13 0324 04/15/13 0245  NA 141 139  --  139 139  K 5.1 4.8  --  5.2 4.3  CL 97 101  --  96 94*  CO2 25  --   --  24 24  GLUCOSE 215* 212*  --  203* 75  BUN 47* 45*  --  56* 30*  CREATININE 9.53* 9.00*  --  10.65* 6.91*  CALCIUM 11.3*  --   --  10.5 9.8  MG  --   --  1.9  --   --   PHOS  --   --  9.5*  --  7.0*   Liver Function Tests:  Recent Labs Lab 04/13/13 1155 04/14/13 0324 04/15/13 0245  AST 9 7  --   ALT 11 10  --   ALKPHOS 61 54  --   BILITOT 0.4 0.4  --   PROT 7.2 6.5  --   ALBUMIN 3.6 3.2* 2.9*     CBC:  Recent Labs Lab 04/13/13 1155 04/13/13 1207  WBC 7.9  --   NEUTROABS 4.9  --   HGB 11.9* 12.9*  HCT 36.4* 38.0*  MCV 97.1  --   PLT 210  --    Cardiac Enzymes:  Recent Labs Lab 04/13/13 1155  TROPONINI <0.30   CBG:  Recent Labs Lab 04/14/13 1738 04/14/13 2344 04/15/13 0342 04/15/13 0746 04/15/13 1124  GLUCAP 91 82 74 66* 177*    Recent Results (from the past 240 hour(s))  MRSA PCR SCREENING     Status: None   Collection Time  04/13/13  8:28 PM      Result Value Ref Range Status   MRSA by PCR NEGATIVE  NEGATIVE Final   Comment:            The GeneXpert MRSA Assay (FDA     approved for NASAL specimens     only), is one component of a     comprehensive MRSA colonization     surveillance program. It is not     intended to diagnose MRSA     infection nor to guide or     monitor treatment for     MRSA infections.  CULTURE, BLOOD (ROUTINE X 2)     Status: None   Collection Time    04/14/13  3:45 PM      Result Value Ref Range Status   Specimen Description BLOOD HEMODIALYSIS CATHETER   Final   Special Requests BOTTLES DRAWN AEROBIC AND ANAEROBIC 10CC   Final   Culture  Setup Time     Final   Value: 04/14/2013 18:46     Performed at [facility]   Culture     Final   Value:        BLOOD CULTURE RECEIVED NO GROWTH TO DATE CULTURE WILL BE HELD FOR 5 DAYS BEFORE ISSUING A FINAL NEGATIVE REPORT     Performed at [facility]   Report Status PENDING   Incomplete  CULTURE, BLOOD (ROUTINE X 2)     Status: None   Collection Time    04/14/13  3:55 PM      Result Value Ref Range Status   Specimen Description BLOOD HEMODIALYSIS CATHETER   Final   Special Requests BOTTLES DRAWN AEROBIC AND ANAEROBIC 10CC   Final   Culture  Setup Time     Final   Value: 04/14/2013 18:45     Performed at [facility]   Culture     Final   Value: GRAM POSITIVE COCCI IN CLUSTERS     Note: Gram Stain Report Called to,Read Back By and Verified With:  Yong Channel 04/15/13 1311 BY [email or web address]     Performed at [facility]   Report Status PENDING   Incomplete     Studies:  Recent x-ray studies have been reviewed in detail by the Attending Physician       Junious Silk, ANP [facility] Office  [phone] Pager [phone]  **If unable to reach the above provider after paging please contact the Flow Manager @ [phone]  On-Call/Text Page:      [email or web address]      password [email or web address]  If 7PM-7AM, please contact night-coverage [email or web address] Password [email or web address] 04/15/2013, 2:32 PM   LOS: 2 days   I have personally examined this patient and reviewed the entire database. I have reviewed the above note, made any necessary editorial changes, and agree with its content.  Lonia Blood, MD [facility]

## 2013-04-15 NOTE — Progress Notes (Signed)
Occupational Therapy Evaluation Patient Details Name: [name] MRN: [id number] DOB: 11/28/1945 Today's Date: 04/15/2013    History of Present Illness 68 year old WM PMHx multiple medical problems including fracture of left hip 2 weeks ago unrepaired per wife (same hip S/P THA 1998), chronic kidney disease on dialysis T/Th/Sat, chronic hypoxemic respiratory failure due to COPD on 3 L nasal cannula oxygen at home, sleep apnea noncompliant with CPAP and diabetes. He also has orthostatic hypotension on dialysis days and is on chronic midodrine.   Clinical Impression   PTA, pt required assitance with ADL and mobility. Apparent decline in function perwife. Wife states she is not able t care for her husband at the current level. Pt will need SNF. Pt legally blind. Will work with pt/wife to increase independence with self feeding with use of compensatory techniques and AE. Will follow.     Follow Up Recommendations  SNF;Supervision/Assistance - 24 hour    Equipment Recommendations  None recommended by OT    Recommendations for Other Services       Precautions / Restrictions Precautions Precautions: Fall Precaution Comments: poor vision, pt reports "I cant see anything out of my left eye" Restrictions Weight Bearing Restrictions: No      Mobility Bed Mobility Overal bed mobility: Needs Assistance Bed Mobility: Supine to Sit;Sit to Supine     Supine to sit: Max assist;+2 for physical assistance Sit to supine: Max assist;+2 for physical assistance   General bed mobility comments: max directional verbal and tactile cues to complete task.  Transfers                 General transfer comment: not assessed due to known L Hip fx. pt unable to sit up EOB without maximal assist    Balance Overall balance assessment: Needs assistance Sitting-balance support: Feet supported;Single extremity supported Sitting balance-Leahy Scale: Poor Sitting balance - Comments: able to sit to  feed self. unsafe sitting EOB                                    ADL Overall ADL's : Needs assistance/impaired Eating/Feeding: Moderate assistance;Cueing for safety;Cueing for sequencing;Sitting (due to poor vision. dropping utensils)   Grooming: Moderate assistance   Upper Body Bathing: Maximal assistance   Lower Body Bathing: Maximal assistance   Upper Body Dressing : Maximal assistance   Lower Body Dressing: Maximal assistance               Functional mobility during ADLs: +2 for safety/equipment;+2 for physical assistance (unable to assess due to +2. unsure of hip status) General ADL Comments: decline in function     Vision                     Perception     Praxis      Pertinent Vitals/Pain no apparent distress      Hand Dominance Right   Extremity/Trunk Assessment Upper Extremity Assessment Upper Extremity Assessment: Generalized weakness;RUE deficits/detail;LUE deficits/detail RUE Sensation: history of peripheral neuropathy LUE Sensation: history of peripheral neuropathy   Lower Extremity Assessment Lower Extremity Assessment: Generalized weakness (PN)   Cervical / Trunk Assessment Cervical / Trunk Assessment: Normal   Communication     Cognition Arousal/Alertness: Awake/alert Behavior During Therapy: WFL for tasks assessed/performed Overall Cognitive Status: Impaired/Different from baseline Area of Impairment: Attention;Following commands;Awareness;Safety/judgement   Current Attention Level: Sustained   Following Commands: Follows one step commands  with increased time Safety/Judgement: Decreased awareness of safety;Decreased awareness of deficits Awareness: Emergent       General Comments       Exercises       Shoulder Instructions      Home Living Family/patient expects to be discharged to:: Skilled nursing facility Living Arrangements: Spouse/significant other;Children Available Help at Discharge:  Family;Available 24 hours/day Type of Home: House Home Access: Ramped entrance     Home Layout: One level               Home Equipment: Walker - 2 wheels;Bedside commode;Wheelchair - IT trainer          Prior Functioning/Environment Level of Independence: Needs assistance  Gait / Transfers Assistance Needed: pt reports of minimal ambulation in over a year ADL's / Homemaking Assistance Needed: pt reports assist with bathing/dressing   Comments: wife staes pt sleeps all of the time  but could transfer himself out of bed    OT Diagnosis: Generalized weakness;Cognitive deficits;Disturbance of vision;Acute pain   OT Problem List: Decreased strength;Decreased range of motion;Decreased activity tolerance;Impaired balance (sitting and/or standing);Impaired vision/perception;Decreased coordination;Decreased safety awareness;Decreased knowledge of use of DME or AE;Cardiopulmonary status limiting activity;Impaired sensation;Obesity;Pain   OT Treatment/Interventions: Self-care/ADL training;Therapeutic exercise;Therapeutic activities;Patient/family education;Balance training    OT Goals(Current goals can be found in the care plan section) Acute Rehab OT Goals Patient Stated Goal: none stated OT Goal Formulation: With patient Time For Goal Achievement: 04/29/13 Potential to Achieve Goals: Fair  OT Frequency: Min 2X/week   Barriers to D/C: Decreased caregiver support          Co-evaluation              End of Session Nurse Communication: Mobility status  Activity Tolerance: Patient tolerated treatment well Patient left: in bed;with call bell/phone within reach;with family/visitor present   Time: 5997-7414 OT Time Calculation (min): 25 min Charges:  OT General Charges $OT Visit: 1 Procedure OT Evaluation $Initial OT Evaluation Tier I: 1 Procedure OT Treatments $Self Care/Home Management : 8-22 mins G-Codes:    [name] 04/27/2013, 6:33  PM   [name], OTR/L  [phone] 04/27/13

## 2013-04-16 DIAGNOSIS — I441 Atrioventricular block, second degree: Secondary | ICD-10-CM

## 2013-04-16 DIAGNOSIS — R7881 Bacteremia: Secondary | ICD-10-CM

## 2013-04-16 DIAGNOSIS — I2510 Atherosclerotic heart disease of native coronary artery without angina pectoris: Secondary | ICD-10-CM

## 2013-04-16 DIAGNOSIS — I959 Hypotension, unspecified: Secondary | ICD-10-CM

## 2013-04-16 LAB — GLUCOSE, CAPILLARY
GLUCOSE-CAPILLARY: 145 mg/dL — AB (ref 70–99)
GLUCOSE-CAPILLARY: 90 mg/dL (ref 70–99)
Glucose-Capillary: 149 mg/dL — ABNORMAL HIGH (ref 70–99)
Glucose-Capillary: 181 mg/dL — ABNORMAL HIGH (ref 70–99)
Glucose-Capillary: 92 mg/dL (ref 70–99)

## 2013-04-16 LAB — CBC
HCT: 32.7 % — ABNORMAL LOW (ref 39.0–52.0)
Hemoglobin: 10.3 g/dL — ABNORMAL LOW (ref 13.0–17.0)
MCH: 30.9 pg (ref 26.0–34.0)
MCHC: 31.5 g/dL (ref 30.0–36.0)
MCV: 98.2 fL (ref 78.0–100.0)
PLATELETS: 234 10*3/uL (ref 150–400)
RBC: 3.33 MIL/uL — ABNORMAL LOW (ref 4.22–5.81)
RDW: 12.7 % (ref 11.5–15.5)
WBC: 14 10*3/uL — AB (ref 4.0–10.5)

## 2013-04-16 LAB — BLOOD GAS, ARTERIAL
Acid-base deficit: 0.3 mmol/L (ref 0.0–2.0)
Bicarbonate: 24.5 mEq/L — ABNORMAL HIGH (ref 20.0–24.0)
Drawn by: 277331
FIO2: 0.21 %
O2 Saturation: 95.2 %
PCO2 ART: 44.2 mmHg (ref 35.0–45.0)
PH ART: 7.362 (ref 7.350–7.450)
Patient temperature: 98.6
TCO2: 25.8 mmol/L (ref 0–100)
pO2, Arterial: 77.5 mmHg — ABNORMAL LOW (ref 80.0–100.0)

## 2013-04-16 LAB — RENAL FUNCTION PANEL
ALBUMIN: 2.9 g/dL — AB (ref 3.5–5.2)
BUN: 50 mg/dL — ABNORMAL HIGH (ref 6–23)
CHLORIDE: 90 meq/L — AB (ref 96–112)
CO2: 25 mEq/L (ref 19–32)
Calcium: 9.9 mg/dL (ref 8.4–10.5)
Creatinine, Ser: 8.52 mg/dL — ABNORMAL HIGH (ref 0.50–1.35)
GFR calc Af Amer: 7 mL/min — ABNORMAL LOW (ref >90)
GFR, EST NON AFRICAN AMERICAN: 6 mL/min — AB (ref >90)
Glucose, Bld: 151 mg/dL — ABNORMAL HIGH (ref 70–99)
Phosphorus: 9.1 mg/dL — ABNORMAL HIGH (ref 2.3–4.6)
Potassium: 4.8 mEq/L (ref 3.7–5.3)
Sodium: 135 mEq/L — ABNORMAL LOW (ref 137–147)

## 2013-04-16 LAB — COMPREHENSIVE METABOLIC PANEL
ALBUMIN: 2.8 g/dL — AB (ref 3.5–5.2)
ALT: 9 U/L (ref 0–53)
AST: 7 U/L (ref 0–37)
Alkaline Phosphatase: 53 U/L (ref 39–117)
BILIRUBIN TOTAL: 0.4 mg/dL (ref 0.3–1.2)
BUN: 22 mg/dL (ref 6–23)
CO2: 25 mEq/L (ref 19–32)
CREATININE: 4.79 mg/dL — AB (ref 0.50–1.35)
Calcium: 9.6 mg/dL (ref 8.4–10.5)
Chloride: 94 mEq/L — ABNORMAL LOW (ref 96–112)
GFR calc Af Amer: 13 mL/min — ABNORMAL LOW (ref >90)
GFR calc non Af Amer: 11 mL/min — ABNORMAL LOW (ref >90)
Glucose, Bld: 98 mg/dL (ref 70–99)
Potassium: 3.8 mEq/L (ref 3.7–5.3)
Sodium: 135 mEq/L — ABNORMAL LOW (ref 137–147)
TOTAL PROTEIN: 6.8 g/dL (ref 6.0–8.3)

## 2013-04-16 LAB — MAGNESIUM: Magnesium: 1.7 mg/dL (ref 1.5–2.5)

## 2013-04-16 LAB — CULTURE, BLOOD (ROUTINE X 2)

## 2013-04-16 LAB — TROPONIN I

## 2013-04-16 MED ORDER — PERFLUTREN LIPID MICROSPHERE
1.0000 mL | INTRAVENOUS | Status: AC | PRN
  Administered 2013-04-16: 2 mL via INTRAVENOUS
  Filled 2013-04-16: qty 10

## 2013-04-16 MED ORDER — MIDODRINE HCL 5 MG PO TABS
ORAL_TABLET | ORAL | Status: AC
  Filled 2013-04-16: qty 2

## 2013-04-16 MED ORDER — PENTAFLUOROPROP-TETRAFLUOROETH EX AERO: 1.0000 "application " | INHALATION_SPRAY | CUTANEOUS | Status: DC | PRN

## 2013-04-16 MED ORDER — ALTEPLASE 2 MG IJ SOLR: 2.0000 mg | Freq: Once | INTRAMUSCULAR | Status: AC | PRN

## 2013-04-16 MED ORDER — LIDOCAINE-PRILOCAINE 2.5-2.5 % EX CREA: 1.0000 "application " | TOPICAL_CREAM | CUTANEOUS | Status: DC | PRN

## 2013-04-16 MED ORDER — LIDOCAINE HCL (PF) 1 % IJ SOLN: 5.0000 mL | INTRAMUSCULAR | Status: DC | PRN

## 2013-04-16 MED ORDER — HEPARIN SODIUM (PORCINE) 1000 UNIT/ML DIALYSIS
8000.0000 [IU] | Freq: Once | INTRAMUSCULAR | Status: AC
Start: 2013-04-16 — End: 2013-04-16
  Administered 2013-04-16: 8000 [IU] via INTRAVENOUS_CENTRAL

## 2013-04-16 MED ORDER — HEPARIN SODIUM (PORCINE) 1000 UNIT/ML DIALYSIS: 1000.0000 [IU] | INTRAMUSCULAR | Status: DC | PRN

## 2013-04-16 MED ORDER — DOPAMINE-DEXTROSE 3.2-5 MG/ML-% IV SOLN: 2.0000 ug/kg/min | INTRAVENOUS | Status: DC

## 2013-04-16 MED ORDER — NEPRO/CARBSTEADY PO LIQD: 237.0000 mL | ORAL | Status: DC | PRN

## 2013-04-16 MED ORDER — SODIUM CHLORIDE 0.9 % IV SOLN: 100.0000 mL | INTRAVENOUS | Status: DC | PRN

## 2013-04-16 NOTE — Progress Notes (Signed)
[name]  BUCK MCAFFEE ZOX:[id number] DOB: [birth year] DOA: 04/13/2013 PCP: [name], MD  Time spent :  35 minutes  Brief narrative: 68 year old M PMHx multiple medical problems including "fracture" of left hip 2 weeks ago unrepaired per wife (same hip S/P THA 1998), chronic kidney disease on dialysis T/Th/Sat, chronic hypoxemic respiratory failure due to COPD on 3 L nasal cannula oxygen at home, sleep apnea noncompliant with CPAP and diabetes. He also has orthostatic hypotension on dialysis days and is on chronic midodrine.  Patient apparently had been experiencing multiple falls and was having difficulty getting up despite assistance from his wife. On the date of admission he was more lethargic with respiratory difficulties and increasing hypoxemia despite his usual oxygen. Recent start by the [facility] on OxyIR and wife was concerned patient may have taken more than was prescribed.  Upon arrival to the ER patient had mild acidosis with hypercapnia and hypoxemia. Patient condition drastically improved with administration of Narcan and use of BiPAP.   HPI/Subjective: Patient awake but clearly confused - restless in bed - will not follow commands or accept redirection.  Assessment/Plan:    Encephalopathy acute -more alert yesterday but today post HD very sleepy- note Neurontin dose decreased 4/15 and has no narcs available- Ultram given only twice in past 48 hrs with last dose 1323 -suspect multifactorial: hypercarbia and narcs -treat underlying causes and follow    Acute and chronic respiratory failure:   A) Baseline COPD (chronic obstructive pulmonary disease)   B) OSA/Obesity hypoventilation syndrome -repeat ABG 4/14 OFF narcs and other sedating meds: persistent hypercarbia with resp acidosis so BiPAP resumed - successfully weaned to 3L by am of 4/15 -today once again lethargic -ABG without hypercarbia so will dc Ultram and only allow  Tylenol -pt non adherent with CPAP at home -no wheezing so COPD compensated   Coag neg staph Bacteremia -1/2 bottles positive-likely contaminant but await final cx on #2 before consider final dx -likely a contaminant but since HD pt does have risk factors for true bacteremia - followup speciation    End stage renal disease on dialysis -consulted Nephro  -T-Th-Sa usual HD days. -does not appear volume overloaded at this time    Opioid overdose / Chronic pain -unintentional and due to ongoing left hip pain (see below) -allowed only Ultram but this too seems to be causing oversedation so will dc and allow only Tylenol -seems to have a degree of diabetic neuropathy so continue Neurontin TID cautiously-may need to dc this too! -no narcs for now      Reported Broken left hip prosthesis -THR 1997 in [place] -wife states pt falling- went to [place] and had X[facility] left hip -[facility] and [facility] had [facility] fracture [facility] area of prosthesis and ?plan to FU for eventual surgical evaluation -wife says pt falls onto left hip frequently -left hip XR here without fracture but was portable and possibly limited data given one view and pt obesity -obtain records from [place]- check CT hip to clarify- if fx detected will need ortho eval while here -per family pt had limited mobility post hip repair in 1997 progressive worsening of mobility - by 2006 essentially WC bound    DM2  -CBGs controlled -NPO due to resp status -cont Lantus and SSI -change CBGs to q 4 hrs    CAD     HTN -issues with orthostasis on HD days could be contributing to  falls -may need to allow permissive HTN which means no anti HTN meds -Nephro has dc'd hydralazine - may need to taper and dc BB as well -Echo done last May 2014 with preserved LV function -repeat    GERD   DVT prophylaxis: Subcutaneous heparin Code Status: Full Family Communication: Multiple family members at bedside Disposition Plan/Expected LOS: Stepdown -PT  recommends SNF but pt with ACUTE encephalopathy so likely will need add'l eval later  Consultants: Nephrology  Procedures: None  Antibiotics: None  Objective: Blood pressure 103/71, pulse 73, temperature 99.3 F (37.4 C), temperature source Oral, resp. rate 13, height 6' (1.829 m), weight 240 lb 8.4 oz (109.1 kg), SpO2 100.00%.  Intake/Output Summary (Last 24 hours) at 04/16/13 1418 Last data filed at 04/16/13 1210  Gross per 24 hour  Intake    200 ml  Output   1713 ml  Net  -1513 ml   Exam: General: No acute respiratory distress Lungs: Very distant breath sounds in all fields with no focal crackles or wheeze Cardiovascular: Regular rate and rhythm without murmur gallop or rub normal S1 and S2, no peripheral edema - distant heart sounds Abdomen: Nontender, nondistended but is obese, soft, bowel sounds positive, no rebound, no ascites, no appreciable mass Musculoskeletal: No significant cyanosis, clubbing of bilateral lower extremities Neurological: Sleepy without apparent focal deficits  Scheduled Meds:  Scheduled Meds: . artificial tears  1 application Right Eye BID  . aspirin  325 mg Oral Daily  . brimonidine  1 drop Both Eyes BID  . calcium acetate  3,335 mg Oral TID WC  . cinacalcet  60 mg Oral BID WC  . clopidogrel  75 mg Oral Q breakfast  . fluticasone  1 puff Inhalation BID  . gabapentin  100 mg Oral TID  . heparin  5,000 Units Subcutaneous 3 times per day  . insulin aspart  0-9 Units Subcutaneous TID WC  . insulin glargine  20 Units Subcutaneous BID  . metoprolol  25 mg Oral BID  . midodrine  10 mg Oral TID WC  . multivitamin  1 tablet Oral QHS  . pantoprazole  40 mg Oral Daily  . rOPINIRole  0.5 mg Oral QHS  . simvastatin  20 mg Oral q1800  . cyanocobalamin  1,000 mcg Oral Daily   Data Reviewed: Basic Metabolic Panel:  Recent Labs Lab 04/13/13 1155 04/13/13 1207 04/13/13 2156 04/14/13 0324 04/15/13 0245 04/16/13 0234  NA 141 139  --  139 139 135*   K 5.1 4.8  --  5.2 4.3 4.8  CL 97 101  --  96 94* 90*  CO2 25  --   --  24 24 25   GLUCOSE 215* 212*  --  203* 75 151*  BUN 47* 45*  --  56* 30* 50*  CREATININE 9.53* 9.00*  --  10.65* 6.91* 8.52*  CALCIUM 11.3*  --   --  10.5 9.8 9.9  MG  --   --  1.9  --   --   --   PHOS  --   --  9.5*  --  7.0* 9.1*   Liver Function Tests:  Recent Labs Lab 04/13/13 1155 04/14/13 0324 04/15/13 0245 04/16/13 0234  AST 9 7  --   --   ALT 11 10  --   --   ALKPHOS 61 54  --   --   BILITOT 0.4 0.4  --   --   PROT 7.2 6.5  --   --  ALBUMIN 3.6 3.2* 2.9* 2.9*   CBC:  Recent Labs Lab 04/13/13 1155 04/13/13 1207 04/16/13 0230  WBC 7.9  --  14.0*  NEUTROABS 4.9  --   --   HGB 11.9* 12.9* 10.3*  HCT 36.4* 38.0* 32.7*  MCV 97.1  --  98.2  PLT 210  --  234   Cardiac Enzymes:  Recent Labs Lab 04/13/13 1155  TROPONINI <0.30   CBG:  Recent Labs Lab 04/15/13 1543 04/15/13 2011 04/15/13 2335 04/16/13 0414 04/16/13 1353  GLUCAP 123* 149* 181* 145* 90    Recent Results (from the past 240 hour(s))  MRSA PCR SCREENING     Status: None   Collection Time    04/13/13  8:28 PM      Result Value Ref Range Status   MRSA by PCR NEGATIVE  NEGATIVE Final   Comment:            The GeneXpert MRSA Assay (FDA     approved for NASAL specimens     only), is one component of a     comprehensive MRSA colonization     surveillance program. It is not     intended to diagnose MRSA     infection nor to guide or     monitor treatment for     MRSA infections.  CULTURE, BLOOD (ROUTINE X 2)     Status: None   Collection Time    04/14/13  3:45 PM      Result Value Ref Range Status   Specimen Description BLOOD HEMODIALYSIS CATHETER   Final   Special Requests BOTTLES DRAWN AEROBIC AND ANAEROBIC 10CC   Final   Culture  Setup Time     Final   Value: 04/14/2013 18:46     Performed at [facility]   Culture     Final   Value:        BLOOD CULTURE RECEIVED NO GROWTH TO DATE CULTURE WILL BE  HELD FOR 5 DAYS BEFORE ISSUING A FINAL NEGATIVE REPORT     Performed at [facility]   Report Status PENDING   Incomplete  CULTURE, BLOOD (ROUTINE X 2)     Status: None   Collection Time    04/14/13  3:55 PM      Result Value Ref Range Status   Specimen Description BLOOD HEMODIALYSIS CATHETER   Final   Special Requests BOTTLES DRAWN AEROBIC AND ANAEROBIC 10CC   Final   Culture  Setup Time     Final   Value: 04/14/2013 18:45     Performed at [facility]   Culture     Final   Value: STAPHYLOCOCCUS SPECIES (COAGULASE NEGATIVE)     Note: THE SIGNIFICANCE OF ISOLATING THIS ORGANISM FROM A SINGLE SET OF BLOOD CULTURES WHEN MULTIPLE SETS ARE DRAWN IS UNCERTAIN. PLEASE NOTIFY THE MICROBIOLOGY DEPARTMENT WITHIN ONE WEEK IF SPECIATION AND SENSITIVITIES ARE REQUIRED.     Note: Gram Stain Report Called to,Read Back By and Verified With: Yong ChannelMAGGIE BAUGHM 04/15/13 1311 BY SMITHERSJ     Performed at [facility]   Report Status 04/16/2013 FINAL   Final     Studies:  Recent x-ray studies have been reviewed in detail by the Attending Physician       Junious SilkAllison Ellis, ANP [facility] Office  [phone] Pager [phone]  **If unable to reach the above provider after paging please contact the Flow Manager @ [phone]  On-Call/Text Page:      Loretha [email or web address]  password [email or web address]  If 7PM-7AM, please contact night-coverage [email or web address] Password [email or web address] 04/16/2013, 2:18 PM   LOS: 3 days  -Examined patient and agree with the assessment and plan. Discussed plan with ANP Revonda Standard. -No family at bedside.

## 2013-04-16 NOTE — Consult Note (Signed)
CARDIOLOGY CONSULT NOTE   Patient ID: [name] MRN: [id number], DOB/AGE: 67/05/1945   Admit date: 04/13/2013 Date of Consult: 04/16/2013  Primary Physician: Dagoberto LigasPATEL,JAY K., MD Primary Cardiologist: none  Reason for consult:  Bradycardia, hypotension  Problem List  Past Medical History  Diagnosis Date  . CHF (congestive heart failure)   . CAD (coronary artery disease) 2006  . COPD (chronic obstructive pulmonary disease)   . Hyperlipidemia   . ESRD (end stage renal disease) on dialysis started 08/2006    MTTS [place]; "getting ready to have it on TTS" (05/07/2012)  . Type II diabetes mellitus     etiology of ESRD  . Diabetic blindness   . Hypertension   . MI, old 2006  . Asthma   . Pneumonia     "everytime I take a cold" (05/07/2012)  . Bronchiectasis   . Shortness of breath     "can happen at anytime" (05/07/2012)  . GERD (gastroesophageal reflux disease)   . Stroke 2006    "numb on left side since"  05/07/2012  . On home oxygen therapy     "3L at night mostly; also prn" (05/07/2012)  . Cardiomegaly     Past Surgical History  Procedure Laterality Date  . Av fistula placement Right 09/2006    "used it once" (05/07/2012)  . Appendectomy  ~ 1958  . Insertion of dialysis catheter Left 09/2006    "chest" (05/07/2012)  . Total hip arthroplasty Left 1997  . Repair ankle ligament Left 1981  . Cataract extraction w/ intraocular lens implant Left ?2012     Allergies  Allergies  Allergen Reactions  . Cefepime Itching  . Penicillins Hives and Swelling    HPI   A 68 y.o. male with a history of COPD on supplemental oxygen at home, Type 2 Diabetes, hypertension, obesity, and ESRD on dialysis who was admitted on 04/13/2013 after a fall from his wheelchair at home and notified his wife before becoming very lethargic and losing consciousness.  His wife, who provided the history, states that he has been taking Oxycontin for left hip pain and may have taken too much.  [facility] where ABGs showed mild acidosis and hypercapnia, acute on chronic respiratory failure requiring BiPAP. He responded intermittently to Narcan, but is still very confused. CT of the head and cervical spine showed no acute abnormality, and chest x-ray showed no edema.  The patient had a cath in 2011 for chest pain with findings of non-obstructive CAD in LAD territory, echo from today shows preserved LVEF, no WMA.  We were consulted for episodes of bradycardia. The patient had a long1. AVB on admission ECG. Today he had intermittent episodes of bradycardia down to low 40' associated with episodes of hypotension (as low as 55/40 mmHg). He was receiving fluids and midodrine.  The blood culture is positive for gram negative staph.  Inpatient Medications  . artificial tears  1 application Right Eye BID  . aspirin  325 mg Oral Daily  . brimonidine  1 drop Both Eyes BID  . calcium acetate  3,335 mg Oral TID WC  . cinacalcet  60 mg Oral BID WC  . clopidogrel  75 mg Oral Q breakfast  . fluticasone  1 puff Inhalation BID  . gabapentin  100 mg Oral TID  . heparin  5,000 Units Subcutaneous 3 times per day  . insulin aspart  0-9 Units Subcutaneous TID WC  . insulin glargine  20 Units  Subcutaneous BID  . metoprolol  25 mg Oral BID  . midodrine  10 mg Oral TID WC  . multivitamin  1 tablet Oral QHS  . pantoprazole  40 mg Oral Daily  . rOPINIRole  0.5 mg Oral QHS  . simvastatin  20 mg Oral q1800  . cyanocobalamin  1,000 mcg Oral Daily    Family History Family History  Problem Relation Age of Onset  . Diabetes Mellitus II Father      Social History History   Social History  . Marital Status: Married    Spouse Name: N/A    Number of Children: N/A  . Years of Education: N/A   Occupational History  . Not on file.   Social History Main Topics  . Smoking status: Former Smoker -- 2.00 packs/day for 30 years    Types: Cigarettes    Quit date: 01/02/1984  . Smokeless  tobacco: Never Used  . Alcohol Use: Yes     Comment: 05/07/2012 "stopped drinking 1979"  . Drug Use: No  . Sexual Activity: Not on file   Other Topics Concern  . Not on file   Social History Narrative  . No narrative on file     Review of Systems  General:  No chills, fever, night sweats or weight changes.  Cardiovascular:  No chest pain, dyspnea on exertion, edema, orthopnea, palpitations, paroxysmal nocturnal dyspnea. Dermatological: No rash, lesions/masses Respiratory: No cough, dyspnea Urologic: No hematuria, dysuria Abdominal:   No nausea, vomiting, diarrhea, bright red blood per rectum, melena, or hematemesis Neurologic:  No visual changes, wkns, changes in mental status. All other systems reviewed and are otherwise negative except as noted above.  Physical Exam  Blood pressure 111/70, pulse 91, temperature 97.9 F (36.6 C), temperature source Oral, resp. rate 20, height 6' (1.829 m), weight 240 lb 8.4 oz (109.1 kg), SpO2 96.00%.  General: Pleasant, NAD Psych: Normal affect. Neuro: Alert and oriented X 3. Moves all extremities spontaneously. HEENT: Normal  Neck: Supple without bruits or JVD. Lungs:  Resp regular and unlabored, CTA. Heart: RRR no s3, s4, or murmurs. Abdomen: Soft, non-tender, non-distended, BS + x 4.  Extremities: No clubbing, cyanosis or edema. DP/PT/Radials 2+ and equal bilaterally.  Labs  No results found for this basename: CKTOTAL, CKMB, TROPONINI,  in the last 72 hours Lab Results  Component Value Date   WBC 14.0* 04/16/2013   HGB 10.3* 04/16/2013   HCT 32.7* 04/16/2013   MCV 98.2 04/16/2013   PLT 234 04/16/2013    Recent Labs Lab 04/14/13 0324  04/16/13 0234  NA 139  < > 135*  K 5.2  < > 4.8  CL 96  < > 90*  CO2 24  < > 25  BUN 56*  < > 50*  CREATININE 10.65*  < > 8.52*  CALCIUM 10.5  < > 9.9  PROT 6.5  --   --   BILITOT 0.4  --   --   ALKPHOS 54  --   --   ALT 10  --   --   AST 7  --   --   GLUCOSE 203*  < > 151*  < > = values in  this interval not displayed. Lab Results  Component Value Date   CHOL 91 04/14/2013   HDL 28* 04/14/2013   LDLCALC 38 04/14/2013   TRIG 127 04/14/2013   Radiology/Studies  Dg Chest 2 View  04/13/2013   CLINICAL DATA:  Cough, shortness of Breath  EXAM:  CHEST  2 VIEW  COMPARISON:  09/25/2012  FINDINGS: Cardiomegaly again noted. Stable left IJ dialysis catheter. Again noted calcified structure adjacent to aortic arch stable from prior exam. No acute infiltrate or pleural effusion. No pulmonary edema. Mild degenerative changes thoracic spine.  IMPRESSION: No active cardiopulmonary disease.   Electronically Signed   By: Natasha Mead M.D.   On: 04/13/2013 12:58   Ct Head Wo Contrast  04/13/2013   CLINICAL DATA:  67 year old male status post fall. Lethargy. Initial encounter.   IMPRESSION: 1. Nonspecific cerebral white matter changes. No acute intracranial abnormality. 2. No acute fracture or listhesis identified in the cervical spine. Ligamentous injury is not excluded. 3. Advanced diffuse calcified atherosclerosis suggesting chronic renal disease/renal failure. 4. Increased paranasal sinus inflammation.   Electronically Signed   By: Augusto Gamble M.D.   On: 04/13/2013 14:12   Echocardiogram - 04/16/2013  - Left ventricle: The cavity size was normal. There was mild concentric hypertrophy. Systolic function was normal. The estimated ejection fraction was in the range of 55% to 60%. Wall motion was normal; there were no regional wall motion abnormalities. - Aortic valve: There was mild to moderate stenosis. Valve area: 1.75cm^2(VTI). Valve area: 1.64cm^2 (Vmax). - Mitral valve: Mildly calcified annulus. Mildly calcified leaflets . Valve area by continuity equation (using LVOT flow): 2.51cm^2. - Left atrium: The atrium was moderately to severely dilated.  ECG: baseline 1.AVB, very long PR interval 318 ms Today : Wenckebach   Tele; episodes of Wenckebach alternating with NSR and ST    ASSESSMENT AND  PLAN  A 68 y.o. male with h/o ESRD on HD, COPD, obesity, non-obstructive CAD who was admitted with acute on chronic respiratory failure after narcotics overdose followed by a fall. We were consulted for episodes of bradycardia and hypotension  1. Intermittent Wenckebach with significant hypotension - we would recommend to check Mg level. We would recommend to start Dopamin infusion for now. Once the patient recovers acute infection (gram negative bacteremia) and still in this rhythm we would consider placement of a pacemaker.    Signed, Lars Masson, MD, [facility] 04/16/2013, 6:48 PM

## 2013-04-16 NOTE — Progress Notes (Signed)
S.; RN paged secondary to patient's hypotension and bradycardia into the low 40s, high 30s. Upon arrival at bedside patient was in NSR however shortly after bradycardia down into the 40s and a rhythm strip showed what appeared to be Wenckebach rhythm. In addition patient began to drop his SpO2 into the low 80s while on room air. Patient's delirium/dementia increased, patient began to believe his mother had died, he came very agitated. NOTE; patient post dialysis  BP; 70/50, HR 45, SpO2 80%  O.; General; A./O. x1, extremely agitated Cardiac; bradycardic, negative murmurs rubs or gallops Musculoskeletal; negative edema Neurologic; patient remains lethargic difficult to arouse, however when aroused becomes very agitated. More confused today than previous believes mother has died, moves all extremities, will not follow commands  A/P New-onset arrhythmia; - appears to be Wenckebach rhythm. Cardiology consulted Dr. Delton See is evaluating  -Obtain CMP, magnesium, troponin x3 -HD today - ; arrhythmia secondary to too much fluid removal?  Agitation -Would not use benzodiazepine in this patient given his history of narcotic overuse. -If arrhythmias resolve would start Haldol. Will await cardiac recommendations

## 2013-04-16 NOTE — Progress Notes (Signed)
Occupational Therapy Treatment Patient Details Name: GADSDEN CAEZ MRN: [id number] DOB: 01-Jan-1946 Today's Date: 04/16/2013    History of present illness 68 year old WM PMHx multiple medical problems including fracture of left hip 2 weeks ago unrepaired per wife (same hip S/P THA 1998), chronic kidney disease on dialysis T/Th/Sat, chronic hypoxemic respiratory failure due to COPD on 3 L nasal cannula oxygen at home, sleep apnea noncompliant with CPAP and diabetes. He also has orthostatic hypotension on dialysis days and is on chronic midodrine.   OT comments  Pt seen to increase independence with feeding and assess proper positioning for meals. Pt needs to be fully alert when eating. Theratubing helps pt hold onto utensils. Asked for soft touch call bell as pt is legally blind. Will continue to follow.   Follow Up Recommendations  SNF;Supervision/Assistance - 24 hour    Equipment Recommendations  None recommended by OT    Recommendations for Other Services      Precautions / Restrictions Precautions Precautions: Fall Precaution Comments: legally blind Restrictions Weight Bearing Restrictions: No       Mobility Bed Mobility Overal bed mobility: Needs Assistance;+2 for physical assistance             General bed mobility comments: +2 total A to scoot up in bed. Pt unable to assist at all.  Transfers                      Balance    needs assistance                               ADL   Eating/Feeding: Moderate assistance;Bed level                                     General ADL Comments: Educated nsg staff on proper set up for feeding using theratubing and set up of tray.       Vision                     Perception     Praxis      Cognition   Behavior During Therapy: Flat affect;Restless Overall Cognitive Status: Impaired/Different from baseline     Current Attention Level: Sustained Memory: Decreased recall  of precautions;Decreased short-term memory  Following Commands: Follows one step commands inconsistently Safety/Judgement: Decreased awareness of safety;Decreased awareness of deficits Awareness: Intellectual        Extremity/Trunk Assessment               Exercises     Shoulder Instructions       General Comments      Pertinent Vitals/ Pain      Pt more lethargic in pm  Home Living                                          Prior Functioning/Environment              Frequency Min 2X/week     Progress Toward Goals  OT Goals(current goals can now be found in the care plan section)  Progress towards OT goals: Progressing toward goals  Acute Rehab OT Goals OT Goal Formulation: With patient Time For Goal Achievement: 04/29/13 Potential to Achieve Goals: Fair  ADL Goals Pt Will Perform Eating: with set-up;with adaptive utensils;with caregiver independent in assisting;sitting Pt Will Perform Grooming: with set-up;sitting  Plan Discharge plan remains appropriate    Co-evaluation                 End of Session  left in bed; bed alarm on;   Activity Tolerance Patient limited by lethargy   Patient Left in bed;with call bell/phone within reach;with nursing/sitter in room   Nurse Communication Mobility status;Other (comment) (feeding/concerns over lethargy/change in status)        Time: [id number] OT Time Calculation (min): 23 min  Charges: OT General Charges $OT Visit: 1 Procedure OT Treatments $Self Care/Home Management : 23-37 mins  Lorinda CreedHilary S Makinzey Banes 04/16/2013, 2:36 PM  [facility] Loyola Santino, OTR/L  [phone] 04/16/2013

## 2013-04-16 NOTE — Progress Notes (Signed)
Pt. Refuses to wear CPAP at this time. Pt. States that he has a CPAP at home but doesn't wear it. Pt. Was explained the importance of wearing CPAP but still refuses.

## 2013-04-16 NOTE — Clinical Social Work Psychosocial (Signed)
Clinical Social Work Department BRIEF PSYCHOSOCIAL ASSESSMENT 04/16/2013  Patient:  [name], [name]     Account Number:  [id number]     Admit date:  04/13/2013  Clinical Social Worker:  Lavell Luster  Date/Time:  04/16/2013 03:30 PM  Referred by:  Physician  Date Referred:  04/16/2013 Referred for  SNF Placement   Other Referral:   Interview type:  Family Other interview type:   Patient's wife was interviewed by phone as no family is at bedside at this time.    PSYCHOSOCIAL DATA Living Status:  WIFE Admitted from facility:   Level of care:   Primary support name:  [name] Primary support relationship to patient:  SPOUSE Degree of support available:   Support is good but patient's wife states that she cannot take care of patient in his current condition.    CURRENT CONCERNS Current Concerns  Post-Acute Placement   Other Concerns:    SOCIAL WORK ASSESSMENT / PLAN CSW spoke with patient's wife by phone to complete assessment. Wife [name] states that patient lives with her and their daughter. [name] states that their daughter is "handicapped". [name] is very worred about the patient's ability to regain his ability to walk again. She states, "I just hope he will walk again and be able to care for himself." CSW explained that PT has recommended SNF placement for patient and she agrees with this recommendation. [name] states that she would prefer patient go to [facility], but is open to other options in [facility]. She states that she does NOT want patient to be placed at [facility] in [place]. CSW explained SNF search/placement process and answered wife's questions.   Assessment/plan status:  Psychosocial Support/Ongoing Assessment of Needs Other assessment/ plan:   Complete FL2, Fax, PASRR   Information/referral to community resources:   CSW contact information given to patient's wife, SNF list will be given with bed offers.     PATIENT'S/FAMILY'S RESPONSE TO PLAN OF CARE: Patient's wife [name] is wanting patient to DC to SNF as she cannot care for him at home. [name] is worried about her husbands condition and hopes that he will improve to baseline level of functioning. CSW offered emotional support. CSW will assist with DC.       [name] MSW, [place], [place], [phone]

## 2013-04-16 NOTE — Progress Notes (Signed)
Noticed Pt SB, ectopy on monitor. Checked Pt, performed EKG, called DR. Will continue to monitor.

## 2013-04-16 NOTE — Progress Notes (Signed)
Gave report to [place], RN from Hemodialysis. En route to transport pt to be dialyzed this morning. Pt is stable.   Maralyn Sago, [employer]-SN

## 2013-04-16 NOTE — Progress Notes (Signed)
  Echocardiogram 2D Echocardiogram has been performed.  [name] 04/16/2013, 4:27 PM

## 2013-04-16 NOTE — Procedures (Signed)
Pt seen on HD.  Ap 230 Vp 210  BFR 400.  SBP 106.  Remains awake and alert though less talkative today.

## 2013-04-17 ENCOUNTER — Inpatient Hospital Stay (HOSPITAL_COMMUNITY): Payer: Medicare Other

## 2013-04-17 DIAGNOSIS — I4510 Unspecified right bundle-branch block: Secondary | ICD-10-CM | POA: Diagnosis present

## 2013-04-17 DIAGNOSIS — I350 Nonrheumatic aortic (valve) stenosis: Secondary | ICD-10-CM | POA: Diagnosis present

## 2013-04-17 DIAGNOSIS — J9620 Acute and chronic respiratory failure, unspecified whether with hypoxia or hypercapnia: Secondary | ICD-10-CM

## 2013-04-17 DIAGNOSIS — I440 Atrioventricular block, first degree: Secondary | ICD-10-CM

## 2013-04-17 LAB — GLUCOSE, CAPILLARY
GLUCOSE-CAPILLARY: 99 mg/dL (ref 70–99)
GLUCOSE-CAPILLARY: 80 mg/dL (ref 70–99)
GLUCOSE-CAPILLARY: 84 mg/dL (ref 70–99)
GLUCOSE-CAPILLARY: 88 mg/dL (ref 70–99)
GLUCOSE-CAPILLARY: 159 mg/dL — AB (ref 70–99)
GLUCOSE-CAPILLARY: 151 mg/dL — AB (ref 70–99)
Glucose-Capillary: 110 mg/dL — ABNORMAL HIGH (ref 70–99)
Glucose-Capillary: 66 mg/dL — ABNORMAL LOW (ref 70–99)

## 2013-04-17 LAB — BLOOD GAS, ARTERIAL
Acid-base deficit: 1.3 mmol/L (ref 0.0–2.0)
BICARBONATE: 24.8 meq/L — AB (ref 20.0–24.0)
DRAWN BY: 325261
Delivery systems: POSITIVE
EXPIRATORY PAP: 8
FIO2: 0.4 %
INSPIRATORY PAP: 16
O2 Saturation: 97.7 %
PCO2 ART: 56.2 mmHg — AB (ref 35.0–45.0)
PH ART: 7.267 — AB (ref 7.350–7.450)
Patient temperature: 98.6
TCO2: 26.5 mmol/L (ref 0–100)
pO2, Arterial: 113 mmHg — ABNORMAL HIGH (ref 80.0–100.0)

## 2013-04-17 LAB — BASIC METABOLIC PANEL
BUN: 27 mg/dL — AB (ref 6–23)
CO2: 26 mEq/L (ref 19–32)
CREATININE: 5.88 mg/dL — AB (ref 0.50–1.35)
Calcium: 10.3 mg/dL (ref 8.4–10.5)
Chloride: 93 mEq/L — ABNORMAL LOW (ref 96–112)
GFR calc Af Amer: 10 mL/min — ABNORMAL LOW (ref >90)
GFR calc non Af Amer: 9 mL/min — ABNORMAL LOW (ref >90)
GLUCOSE: 82 mg/dL (ref 70–99)
Potassium: 4 mEq/L (ref 3.7–5.3)
Sodium: 135 mEq/L — ABNORMAL LOW (ref 137–147)

## 2013-04-17 LAB — TROPONIN I: Troponin I: <0.3 ng/mL (ref <0.30)

## 2013-04-17 LAB — POCT I-STAT 3, ART BLOOD GAS (G3+)
Acid-Base Excess: 1 mmol/L (ref 0.0–2.0)
BICARBONATE: 29 meq/L — AB (ref 20.0–24.0)
O2 Saturation: 57 %
PCO2 ART: 61.3 mmHg — AB (ref 35.0–45.0)
TCO2: 31 mmol/L (ref 0–100)
pH, Arterial: 7.283 — ABNORMAL LOW (ref 7.350–7.450)
pO2, Arterial: 35 mmHg — CL (ref 80.0–100.0)

## 2013-04-17 MED ORDER — DEXTROSE-NACL 5-0.9 % IV SOLN
INTRAVENOUS | Status: DC
  Administered 2013-04-17: 17:00:00 via INTRAVENOUS

## 2013-04-17 MED ORDER — DEXTROSE 50 % IV SOLN
INTRAVENOUS | Status: AC
  Filled 2013-04-17: qty 50

## 2013-04-17 MED ORDER — SEVELAMER CARBONATE 800 MG PO TABS
1600.0000 mg | ORAL_TABLET | Freq: Three times a day (TID) | ORAL | Status: DC
  Filled 2013-04-17 (×6): qty 2

## 2013-04-17 MED ORDER — DEXTROSE 50 % IV SOLN
25.0000 mL | Freq: Once | INTRAVENOUS | Status: AC | PRN
  Administered 2013-04-17: 50 mL via INTRAVENOUS

## 2013-04-17 MED ORDER — INSULIN ASPART 100 UNIT/ML ~~LOC~~ SOLN: 0.0000 [IU] | Freq: Three times a day (TID) | SUBCUTANEOUS | Status: DC

## 2013-04-17 NOTE — Progress Notes (Signed)
Utilization review completed.  

## 2013-04-17 NOTE — Progress Notes (Signed)
Physical Therapy Treatment Patient Details Name: [name] MRN: [id number] DOB: [birth year] Today's Date: 04/17/2013    History of Present Illness 68 year old WM PMHx multiple medical problems including fracture of left hip 2 weeks ago unrepaired per wife seen at [place] without weight or movement restrictions(same hip S/P THA 1998), chronic kidney disease on dialysis T/Th/Sat, chronic hypoxemic respiratory failure due to COPD on 3 L nasal cannula oxygen at home, sleep apnea noncompliant with CPAP and diabetes. He also has orthostatic hypotension on dialysis days and is on chronic midodrine.    PT Comments    Spouse present throughout stating pt was alert and conversing last night. Pt currently too lethargic to participate even once EOB and painful stimuli of sternal rub and nail bed pressure. Spouse educated for PROM and activity as able to tolerate when alert. Will continue to follow.   Follow Up Recommendations  SNF;Supervision/Assistance - 24 hour     Equipment Recommendations       Recommendations for Other Services       Precautions / Restrictions Precautions Precautions: Fall Precaution Comments: legally blind    Mobility  Bed Mobility Overal bed mobility: Needs Assistance;+2 for physical assistance Bed Mobility: Sidelying to Sit;Sit to Supine   Sidelying to sit: +2 for physical assistance;Total assist   Sit to supine: Total assist;+2 for physical assistance   General bed mobility comments: pt lethargic with eyes closed throughout and total assist to sit EOB without increase in arousal and returned to supine after 5 min with total assist to scoot to [facility]  Transfers                    Ambulation/Gait                 Stairs            Wheelchair Mobility    Modified Rankin (Stroke Patients Only)       Balance     Sitting balance-Leahy Scale: Zero Sitting balance - Comments: EOB 5 min with total assist due to posterior left  lean Postural control: Posterior lean;Left lateral lean                          Cognition Arousal/Alertness: Careers information officer Exercises - Lower Extremity Long Arc Quad: PROM;Both;15 reps Hip Flexion/Marching: PROM;Both;15 reps    General Comments        Pertinent Vitals/Pain CPOT = 0 93% on 40% bipap HR 95    Home Living                      Prior Function            PT Goals (current goals can now be found in the care plan section) Progress towards PT goals: Not progressing toward goals - comment (limited by lethargy and lack of arousal)    Frequency  Min 2X/week    PT Plan Current plan remains appropriate;Frequency needs to be updated    Co-evaluation             End of Session Equipment Utilized During Treatment: Oxygen Activity Tolerance: Patient limited by lethargy Patient left: in bed;with call bell/phone within reach;with family/visitor present     Time: 9628-3662 PT Time Calculation (min): 15 min  Charges:  $Therapeutic  Activity: 8-22 mins                    G Codes:      Pati Thinnes B Galen Russman 04/17/2013, 8:59 AM Delaney MeigsMaija Tabor Jacy Howat, PT [phone]

## 2013-04-17 NOTE — Progress Notes (Signed)
[name]  [name] RUE:[id number] DOB: 11/27/1945 DOA: 04/13/2013 PCP: Dagoberto LigasPATEL,JAY K., MD  Time spent :  35 minutes  Brief narrative: 68 year old M PMHx multiple medical problems including "fracture" of left hip 2 weeks ago unrepaired per wife (same hip S/P THA 1998), chronic kidney disease on dialysis T/Th/Sat, chronic hypoxemic respiratory failure due to COPD on 3 L nasal cannula oxygen at home, sleep apnea noncompliant with CPAP and diabetes. He also has orthostatic hypotension on dialysis days and is on chronic midodrine.  Patient apparently had been experiencing multiple falls and was having difficulty getting up despite assistance from his wife. On the date of admission he was more lethargic with respiratory difficulties and increasing hypoxemia despite his usual oxygen. [facility] and wife was concerned patient may have taken more than was prescribed.  Upon arrival to the ER patient had mild acidosis with hypercapnia and hypoxemia. Patient condition drastically improved with administration of Narcan and use of BiPAP.   HPI/Subjective: Patient lethargic on BiPAP (started shortly before exam).  The patient will arouse to sternal pressure but does not speak or interact.  Assessment/Plan:  Encephalopathy - acute -more alert yesterday  -Ultram dc'd 4/16 -Stop Neurontin -Suspect altered mental status today due primarily to noncompliance with CPAP last night - continue BiPAP and recheck ABG later today to assure PCO2/pH improving  Symptomatic bradycardia/Wenkebach -bradycardia associated with hypotension and mental status changes -Cardiology consulted- rec Dopamine but hr/BP spontaneously recovered and Dopamine was not required -Cards thinks may require pacer but is not currently candidate due to possible bacteremia  -suspect this with orthostasis likely contributing to frequent falls at home -dc'd Lopressor  this am (see below re: orthostasis)  Acute and chronic respiratory failure:   A) Baseline COPD (chronic obstructive pulmonary disease)   B) OSA/Obesity hypoventilation syndrome -repeat ABG 4/14 OFF narcs and other sedating meds: persistent hypercarbia with resp acidosis so BiPAP resumed - successfully weaned to 3L by am of 4/15 -lethargic again on 4/16 but ABG without hypercarbia so dc'd Ultram and only allowed Tylenol -today lethargic again after refusing to use CPAP or BiPAP overnight- ABG this time c/w mild hypercarbic resp failure - BIPAP to remain in place - recheck BIPAP later this evening  -pt non adherent with CPAP at home  Coag neg staph Bacteremia -1/2 bottles positive-likely contaminant but await final cx on #2 before consider final dx -follow up on 2nd set of blood cx's obtained 4/17 -likely a contaminant but since HD pt does have risk factors for true bacteremia - followup speciation  End stage renal disease on dialysis -consulted Nephro  -T-Th-Sa usual HD days. -does not appear volume overloaded at this time  Opioid overdose / Chronic pain -unintentional and due to ongoing left hip pain (see below) -allowed only Ultram but this too seems to be causing oversedation so will dc and allow only Tylenol -no narcs for now    Reported Broken left hip prosthesis -THR 1997 in [place] -wife states pt falling- [facility] and [facility] -[facility] says [facility] and stated pt had hip fracture at area of prosthesis and ?plan to FU for eventual surgical evaluation -wife says pt falls onto left hip frequently -left hip XR here without fracture but was portable and possibly limited data given one view and pt obesity -obtain records from [facility] - CT hip: Left total hip arthroplasty without hardware failure or  complication. -per family pt had limited mobility post hip repair in 1997 progressive worsening of mobility - by 2006 essentially WC bound  DM2  -CBGs controlled -NPO due to resp  status -cont Lantus and SSI -change CBGs to q 4 hrs  CAD   HTN -issues with orthostasis on HD days could be contributing to falls -may need to allow permissive HTN  -Nephro has dc'd hydralazine  -Echo done last May 2014 with preserved LV function -repeat  GERD   DVT prophylaxis: Subcutaneous heparin Code Status: Full Family Communication: Multiple family members at bedside Disposition Plan/Expected LOS: Stepdown - PT recommends SNF but pt with ACUTE encephalopathy so likely will need add'l eval later  Consultants: Nephrology  Procedures: None  Antibiotics: None  Objective: Blood pressure 129/74, pulse 90, temperature 99.1 F (37.3 C), temperature source Axillary, resp. rate 12, height 6' (1.829 m), weight 242 lb 15.2 oz (110.2 kg), SpO2 100.00%.  Intake/Output Summary (Last 24 hours) at 04/17/13 1251 Last data filed at 04/16/13 1958  Gross per 24 hour  Intake     90 ml  Output      0 ml  Net     90 ml   Exam: General: obtunded - on BIPAP  Lungs: Very distant breath sounds in all fields with no focal crackles or wheeze - back on BiPAP for hypercarbia Cardiovascular: Regular rate and rhythm without murmur gallop or rub normal S1 and S2, no peripheral edema - distant heart sounds Abdomen: Nontender, nondistended but is obese, soft, bowel sounds positive, no rebound, no ascites, no appreciable mass Musculoskeletal: No significant cyanosis, clubbing of bilateral lower extremities Neurological: Sleepy without apparent focal deficits  Scheduled Meds:  Scheduled Meds: . artificial tears  1 application Right Eye BID  . aspirin  325 mg Oral Daily  . brimonidine  1 drop Both Eyes BID  . cinacalcet  60 mg Oral BID WC  . clopidogrel  75 mg Oral Q breakfast  . fluticasone  1 puff Inhalation BID  . gabapentin  100 mg Oral TID  . heparin  5,000 Units Subcutaneous 3 times per day  . insulin aspart  0-9 Units Subcutaneous TID WC  . insulin glargine  20 Units Subcutaneous BID    . midodrine  10 mg Oral TID WC  . multivitamin  1 tablet Oral QHS  . pantoprazole  40 mg Oral Daily  . rOPINIRole  0.5 mg Oral QHS  . sevelamer carbonate  1,600 mg Oral TID WC  . simvastatin  20 mg Oral q1800  . cyanocobalamin  1,000 mcg Oral Daily   Data Reviewed: Basic Metabolic Panel:  Recent Labs Lab 04/13/13 2156 04/14/13 0324 04/15/13 0245 04/16/13 0234 04/16/13 1852 04/17/13 0637  NA  --  139 139 135* 135* 135*  K  --  5.2 4.3 4.8 3.8 4.0  CL  --  96 94* 90* 94* 93*  CO2  --  24 24 25 25 26   GLUCOSE  --  203* 75 151* 98 82  BUN  --  56* 30* 50* 22 27*  CREATININE  --  10.65* 6.91* 8.52* 4.79* 5.88*  CALCIUM  --  10.5 9.8 9.9 9.6 10.3  MG 1.9  --   --   --  1.7  --   PHOS 9.5*  --  7.0* 9.1*  --   --    Liver Function Tests:  Recent Labs Lab 04/13/13 1155 04/14/13 0324 04/15/13 0245 04/16/13 0234 04/16/13 1852  AST 9 7  --   --  7  ALT 11 10  --   --  9  ALKPHOS 61 54  --   --  53  BILITOT 0.4 0.4  --   --  0.4  PROT 7.2 6.5  --   --  6.8  ALBUMIN 3.6 3.2* 2.9* 2.9* 2.8*   CBC:  Recent Labs Lab 04/13/13 1155 04/13/13 1207 04/16/13 0230  WBC 7.9  --  14.0*  NEUTROABS 4.9  --   --   HGB 11.9* 12.9* 10.3*  HCT 36.4* 38.0* 32.7*  MCV 97.1  --  98.2  PLT 210  --  234   Cardiac Enzymes:  Recent Labs Lab 04/13/13 1155 04/15/13 2347 04/16/13 1852 04/17/13 0637  TROPONINI <0.30 <0.30 <0.30 <0.30   CBG:  Recent Labs Lab 04/16/13 1631 04/16/13 2003 04/16/13 2329 04/17/13 0356 04/17/13 0852  GLUCAP 92 99 110* 80 84    Recent Results (from the past 240 hour(s))  MRSA PCR SCREENING     Status: None   Collection Time    04/13/13  8:28 PM      Result Value Ref Range Status   MRSA by PCR NEGATIVE  NEGATIVE Final   Comment:            The GeneXpert MRSA Assay (FDA     approved for NASAL specimens     only), is one component of a     comprehensive MRSA colonization     surveillance program. It is not     intended to diagnose MRSA      infection nor to guide or     monitor treatment for     MRSA infections.  CULTURE, BLOOD (ROUTINE X 2)     Status: None   Collection Time    04/14/13  3:45 PM      Result Value Ref Range Status   Specimen Description BLOOD HEMODIALYSIS CATHETER   Final   Special Requests BOTTLES DRAWN AEROBIC AND ANAEROBIC 10CC   Final   Culture  Setup Time     Final   Value: 04/14/2013 18:46     Performed at [facility]   Culture     Final   Value:        BLOOD CULTURE RECEIVED NO GROWTH TO DATE CULTURE WILL BE HELD FOR 5 DAYS BEFORE ISSUING A FINAL NEGATIVE REPORT     Performed at [facility]   Report Status PENDING   Incomplete  CULTURE, BLOOD (ROUTINE X 2)     Status: None   Collection Time    04/14/13  3:55 PM      Result Value Ref Range Status   Specimen Description BLOOD HEMODIALYSIS CATHETER   Final   Special Requests BOTTLES DRAWN AEROBIC AND ANAEROBIC 10CC   Final   Culture  Setup Time     Final   Value: 04/14/2013 18:45     Performed at [facility]   Culture     Final   Value: STAPHYLOCOCCUS SPECIES (COAGULASE NEGATIVE)     Note: THE SIGNIFICANCE OF ISOLATING THIS ORGANISM FROM A SINGLE SET OF BLOOD CULTURES WHEN MULTIPLE SETS ARE DRAWN IS UNCERTAIN. PLEASE NOTIFY THE MICROBIOLOGY DEPARTMENT WITHIN ONE WEEK IF SPECIATION AND SENSITIVITIES ARE REQUIRED.     Note: Gram Stain Report Called to,Read Back By and Verified With: Yong Channel 04/15/13 1311 BY SMITHERSJ     Performed at [facility]   Report Status 04/16/2013 FINAL   Final     Studies:  Recent x-ray studies have been reviewed in detail by the Attending Physician       Junious Silk, ANP [facility] Office  [phone] Pager [phone]  **If unable to reach the above provider after paging please contact the Flow Manager @ [phone]  On-Call/Text Page:      [email or web address]      password [email or web address]  If 7PM-7AM, please contact night-coverage [email or web address] Password [email or web address] 04/17/2013,  12:51 PM   LOS: 4 days   I have personally examined this patient and reviewed the entire database. I have reviewed the above note, made any necessary editorial changes, and agree with its content.  Lonia Blood, MD [facility]

## 2013-04-17 NOTE — Plan of Care (Signed)
Events of yesterday evening reviewed. Pt with symptomatic bradycardia/intermittant Wenkebach. Cards consulted and recommended IV Dopamine (likely need for PPM after recovers from current infections). Mg level was 1.7. His HR and BP recovered without need for Dopamine so this was not started. I have reviewed med list and will dc Lopressor. Pt with documented orthostasis post HD and is on chronic Midodrine. He has also been falling frequently at home and possibly heart block etiology.  Junious Silk, ANP

## 2013-04-17 NOTE — Clinical Social Work Placement (Addendum)
Clinical Social Work Department CLINICAL SOCIAL WORK PLACEMENT NOTE 04/17/2013  Patient:  [name], [name]  Account Number:  [id number] Admit date:  04/13/2013  Clinical Social Worker:  Cherre Blanc, [place]  Date/time:  04/17/2013 08:00 AM  Clinical Social Work is seeking post-discharge placement for this patient at the following level of care:   SKILLED NURSING   (*CSW will update this form in Epic as items are completed)   04/17/2013  Patient/family provided with [facility] Department of Clinical Social Work's list of facilities offering this level of care within the geographic area requested by the patient (or if unable, by the patient's family).  04/17/2013  Patient/family informed of their freedom to choose among providers that offer the needed level of care, that participate in Medicare, Medicaid or managed care program needed by the patient, have an available bed and are willing to accept the patient.  04/17/2013  Patient/family informed of [facility]' ownership interest in [facility], as well as of the fact that they are under no obligation to receive care at this facility.  PASARR submitted to EDS on 04/17/2013 PASARR number received from EDS on 04/17/2013  FL2 transmitted to all facilities in geographic area requested by pt/family on  04/17/2013 FL2 transmitted to all facilities within larger geographic area on 04/17/2013  Patient informed that his/her managed care company has contracts with or will negotiate with  certain facilities, including the following:     Patient/family informed of bed offers received: 04/21/13  Patient chooses bed at [facility] Physician recommends and patient chooses bed at    Patient to be transferred to [facility] on 04/22/13   Patient to be transferred to facility by ambulance  The following physician request were entered in Epic:  Additional Comments: 04/22/13: Wife chose facility and is  aware that patient will d/c today to [facility].   Alvina Chou, LCSW    Roddie Mc MSW, [place], [place], [phone]

## 2013-04-17 NOTE — Progress Notes (Signed)
Subjective:  Confused, on C-pap, not following commands  Objective:  Vital Signs in the last 24 hours: Temp:  [97.7 F (36.5 C)-99.1 F (37.3 C)] 99.1 F (37.3 C) (04/17 0800) Pulse Rate:  [40-95] 90 (04/17 1000) Resp:  [11-20] 12 (04/17 1000) BP: (88-173)/(46-83) 129/74 mmHg (04/17 1000) SpO2:  [93 %-100 %] 100 % (04/17 1000) FiO2 (%):  [40 %] 40 % (04/17 0856) Weight:  [240 lb 8.4 oz (109.1 kg)-242 lb 15.2 oz (110.2 kg)] 242 lb 15.2 oz (110.2 kg) (04/17 0400)  Intake/Output from previous day:  Intake/Output Summary (Last 24 hours) at 04/17/13 1143 Last data filed at 04/16/13 1958  Gross per 24 hour  Intake     90 ml  Output   1683 ml  Net  -1593 ml    Physical Exam: General appearance: combative, morbidly obese and slowed mentation Lungs: decreased breath sounds Heart: regular rate and rhythm   Rate: 92  Rhythm: NSR with 1st AVB and RBBB  Lab Results:  Recent Labs  04/16/13 0230  WBC 14.0*  HGB 10.3*  PLT 234    Recent Labs  04/16/13 1852 04/17/13 0637  NA 135* 135*  K 3.8 4.0  CL 94* 93*  CO2 25 26  GLUCOSE 98 82  BUN 22 27*  CREATININE 4.79* 5.88*    Recent Labs  04/16/13 1852 04/17/13 0637  TROPONINI <0.30 <0.30   No results found for this basename: INR,  in the last 72 hours  Imaging: Imaging results have been reviewed  Cardiac Studies: Echo 04/16/13- - Left ventricle: The cavity size was normal. There was mild concentric hypertrophy. Systolic function was normal. The estimated ejection fraction was in the range of 55% to 60%. Wall motion was normal; there were no regional wall motion abnormalities. - Aortic valve: There was mild to moderate stenosis. Valve area: 1.75cm^2(VTI). Valve area: 1.64cm^2 (Vmax). - Mitral valve: Mildly calcified annulus. Mildly calcified leaflets . Valve area by continuity equation (using LVOT flow): 2.51cm^2. - Left atrium: The atrium was moderately to severely dilated.   Assessment/Plan:  A 68  y.o. male with h/o ESRD on HD, COPD, obesity, non-obstructive CAD 2011, who was admitted with acute on chronic respiratory failure after narcotics overdose followed by a fall. We were consulted for episodes of bradycardia and hypotension. Telemetry shows intermittent Wenckebach with hypotension, chronic RBBB, and chronic long 1st degree AVB.     Principal Problem:   Encephalopathy, acute- (fall at home after opioid OD)  Active Problems:   Opioid overdose- (hip pain)   Acute and chronic respiratory failure   Hypotension   ESRD (end stage renal disease) on dialysis   DM2 (diabetes mellitus, type 2)   COPD (chronic obstructive pulmonary disease)   Wenckebach second degree AV block   First degree AV block   RBBB   CAD- non obstructive 2011,    Chronic pain   HTN (hypertension)   GERD (gastroesophageal reflux disease)   OSA (obstructive sleep apnea)   Obesity hypoventilation syndrome  PLAN: MD to see, rhythm issues look to be chronic, though not slow in the past. I spoke with his wife. He has become "too much for me to handle at home" They want to pursue SNF. He currently does not appear to be a candidate for a pacemaker.   Corine Shelter PA-C Beeper [phone] 04/17/2013, 11:43 AM  The patient was seen, examined and discussed with Corine Shelter, PA-C and I agree with the above.   A 68 y.o.  male with h/o ESRD on HD, COPD, obesity, non-obstructive CAD who was admitted with acute on chronic respiratory failure after narcotics overdose followed by a fall. We were consulted for episodes intermittent Wenckebach with significant hypotension. Unfortunately  The patient is currently not a candidate for a pacemaker placement (bacteremia, mental status changes). For now use Dopamine infusion as needed.   Lars MassonKatarina H Eldra Word 04/17/2013

## 2013-04-17 NOTE — Progress Notes (Signed)
RT placed patient on BIPAP.  During the day patient had been on BIPAP with settings of 16/8.  When RT placed patient on these settings exhaled tidal volume was 1400.  RT decreased pressures to 12/6.  Exhaled tidal volumes are now around 600.  Patient tolerating well at this time and vital signs are stable on these settings.  RT will continue to monitor.

## 2013-04-17 NOTE — Progress Notes (Signed)
S: non communicative  O:BP 173/81  Pulse 90  Temp(Src) 99.1 F (37.3 C) (Axillary)  Resp 14  Ht 6' (1.829 m)  Wt 110.2 kg (242 lb 15.2 oz)  BMI 32.94 kg/m2  SpO2 100%  Intake/Output Summary (Last 24 hours) at 04/17/13 0841 Last data filed at 04/16/13 1958  Gross per 24 hour  Intake     90 ml  Output   1683 ml  Net  -1593 ml   Weight change:  Gen: on BiPap CVS:RRR Resp: clear ant Abd:+ BS ND, soft.  Ext: no edema NEURO: moves ext but not responding to verbal stimuli Lt IJ permcath   . artificial tears  1 application Right Eye BID  . aspirin  325 mg Oral Daily  . brimonidine  1 drop Both Eyes BID  . calcium acetate  3,335 mg Oral TID WC  . cinacalcet  60 mg Oral BID WC  . clopidogrel  75 mg Oral Q breakfast  . fluticasone  1 puff Inhalation BID  . gabapentin  100 mg Oral TID  . heparin  5,000 Units Subcutaneous 3 times per day  . insulin aspart  0-9 Units Subcutaneous TID WC  . insulin glargine  20 Units Subcutaneous BID  . midodrine  10 mg Oral TID WC  . multivitamin  1 tablet Oral QHS  . pantoprazole  40 mg Oral Daily  . rOPINIRole  0.5 mg Oral QHS  . simvastatin  20 mg Oral q1800  . cyanocobalamin  1,000 mcg Oral Daily   Ct Hip Left Wo Contrast  04/17/2013   CLINICAL DATA:  Question occult fracture, pain  EXAM: CT OF THE LEFT HIP WITHOUT CONTRAST  TECHNIQUE: Multidetector CT imaging was performed according to the standard protocol. Multiplanar CT image reconstructions were also generated.  COMPARISON:  None.  FINDINGS: There is a total left hip arthroplasty. There is beam hardening artifact resulting from the arthroplasty obscuring the adjacent soft tissue and osseous structures. There is no hardware failure or complication. There is no focal fluid collection or hematoma. There is no acute fracture or dislocation.  There is mild fatty atrophy of the musculature. There is peripheral vascular atherosclerotic disease.  IMPRESSION: Left total hip arthroplasty without  hardware failure or complication.   Electronically Signed   By: Elige KoHetal  Patel   On: 04/17/2013 04:13   BMET    Component Value Date/Time   NA 135* 04/17/2013 0637   K 4.0 04/17/2013 0637   CL 93* 04/17/2013 0637   CO2 26 04/17/2013 0637   GLUCOSE 82 04/17/2013 0637   BUN 27* 04/17/2013 0637   CREATININE 5.88* 04/17/2013 0637   CALCIUM 10.3 04/17/2013 0637   CALCIUM 10.2 08/31/2009 0850   GFRNONAA 9* 04/17/2013 0637   GFRAA 10* 04/17/2013 0637   CBC    Component Value Date/Time   WBC 14.0* 04/16/2013 0230   RBC 3.33* 04/16/2013 0230   HGB 10.3* 04/16/2013 0230   HCT 32.7* 04/16/2013 0230   PLT 234 04/16/2013 0230   MCV 98.2 04/16/2013 0230   MCH 30.9 04/16/2013 0230   MCHC 31.5 04/16/2013 0230   RDW 12.7 04/16/2013 0230   LYMPHSABS 1.7 04/13/2013 1155   MONOABS 1.1* 04/13/2013 1155   EOSABS 0.2 04/13/2013 1155   BASOSABS 0.1 04/13/2013 1155     Assessment: 1. Hypercapneic resp failure poss sec to narcotics, improved 2. Mild hypercalcemia 3. 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 DM 5. COPD 6. ESRD 7. Anemia  EPO on hold 8.  Episode  of bradycardia, NSR now 9. 1/2 BC + for coag neg staph, suspect this is a contaminant but since he has a catheter will get BC thru HD catheter. Plan: 1. HD tomorrow 2. BC x 2 3. Dc phoslo and switch to Lajuana Carry

## 2013-04-17 NOTE — Progress Notes (Signed)
Pt. Refused CPAP for the night. Pt. Is very confused at this time & in restraints.

## 2013-04-17 NOTE — Progress Notes (Addendum)
Text paged K. Schorr-NP in regards to the Dopamine ordered and whether to start it or hold it. Pt's pressure and HR have been stabled. K. Schorr instructed me to contact the cardiologist and send her a text page update. After paging the cardiologist, Dr. Nolon Nations called me and said to hold the Dopamine but have it on standby in case pt's HR drops again. Updated K. Schorr-NP following conversation with Dr. Hiram Comber.   Maralyn Sago, [employer]-SN

## 2013-04-18 LAB — GLUCOSE, CAPILLARY
GLUCOSE-CAPILLARY: 95 mg/dL (ref 70–99)
GLUCOSE-CAPILLARY: 101 mg/dL — AB (ref 70–99)
Glucose-Capillary: 155 mg/dL — ABNORMAL HIGH (ref 70–99)
Glucose-Capillary: 80 mg/dL (ref 70–99)
Glucose-Capillary: >600 mg/dL (ref 70–99)

## 2013-04-18 LAB — RENAL FUNCTION PANEL
Albumin: 3 g/dL — ABNORMAL LOW (ref 3.5–5.2)
BUN: 39 mg/dL — AB (ref 6–23)
CO2: 25 mEq/L (ref 19–32)
Calcium: 10.7 mg/dL — ABNORMAL HIGH (ref 8.4–10.5)
Chloride: 94 mEq/L — ABNORMAL LOW (ref 96–112)
Creatinine, Ser: 7.71 mg/dL — ABNORMAL HIGH (ref 0.50–1.35)
GFR calc non Af Amer: 6 mL/min — ABNORMAL LOW (ref >90)
GFR, EST AFRICAN AMERICAN: 7 mL/min — AB (ref >90)
GLUCOSE: 109 mg/dL — AB (ref 70–99)
PHOSPHORUS: 7.9 mg/dL — AB (ref 2.3–4.6)
POTASSIUM: 4.4 meq/L (ref 3.7–5.3)
SODIUM: 137 meq/L (ref 137–147)

## 2013-04-18 LAB — BASIC METABOLIC PANEL
BUN: 20 mg/dL (ref 6–23)
CHLORIDE: 94 meq/L — AB (ref 96–112)
CO2: 23 mEq/L (ref 19–32)
Calcium: 10.3 mg/dL (ref 8.4–10.5)
Creatinine, Ser: 4.94 mg/dL — ABNORMAL HIGH (ref 0.50–1.35)
GFR calc Af Amer: 13 mL/min — ABNORMAL LOW (ref >90)
GFR calc non Af Amer: 11 mL/min — ABNORMAL LOW (ref >90)
GLUCOSE: 82 mg/dL (ref 70–99)
POTASSIUM: 3.6 meq/L — AB (ref 3.7–5.3)
Sodium: 134 mEq/L — ABNORMAL LOW (ref 137–147)

## 2013-04-18 LAB — CBC
HEMATOCRIT: 31.5 % — AB (ref 39.0–52.0)
HEMOGLOBIN: 10.2 g/dL — AB (ref 13.0–17.0)
MCH: 31.2 pg (ref 26.0–34.0)
MCHC: 32.4 g/dL (ref 30.0–36.0)
MCV: 96.3 fL (ref 78.0–100.0)
Platelets: 246 10*3/uL (ref 150–400)
RBC: 3.27 MIL/uL — AB (ref 4.22–5.81)
RDW: 12.3 % (ref 11.5–15.5)
WBC: 9.8 10*3/uL (ref 4.0–10.5)

## 2013-04-18 LAB — AMMONIA: Ammonia: 22 umol/L (ref 11–60)

## 2013-04-18 LAB — FOLATE

## 2013-04-18 LAB — VITAMIN B12: Vitamin B-12: >2000 pg/mL — ABNORMAL HIGH (ref 211–911)

## 2013-04-18 LAB — RPR

## 2013-04-18 MED ORDER — MIDODRINE HCL 5 MG PO TABS
10.0000 mg | ORAL_TABLET | Freq: Three times a day (TID) | ORAL | Status: DC
  Administered 2013-04-19 – 2013-04-20 (×5): 10 mg via ORAL
  Filled 2013-04-18 (×11): qty 2

## 2013-04-18 MED ORDER — INSULIN ASPART 100 UNIT/ML ~~LOC~~ SOLN
0.0000 [IU] | Freq: Three times a day (TID) | SUBCUTANEOUS | Status: DC
  Administered 2013-04-19: 2 [IU] via SUBCUTANEOUS
  Administered 2013-04-19 – 2013-04-20 (×3): 1 [IU] via SUBCUTANEOUS
  Administered 2013-04-21: 2 [IU] via SUBCUTANEOUS
  Administered 2013-04-22 (×2): 1 [IU] via SUBCUTANEOUS

## 2013-04-18 MED ORDER — DIPHENHYDRAMINE HCL 50 MG/ML IJ SOLN
25.0000 mg | Freq: Once | INTRAMUSCULAR | Status: AC
  Administered 2013-04-18: 25 mg via INTRAVENOUS

## 2013-04-18 MED ORDER — DIPHENHYDRAMINE HCL 50 MG/ML IJ SOLN
INTRAMUSCULAR | Status: AC
  Filled 2013-04-18: qty 1

## 2013-04-18 MED ORDER — SEVELAMER CARBONATE 800 MG PO TABS
1600.0000 mg | ORAL_TABLET | Freq: Three times a day (TID) | ORAL | Status: DC
Start: 2013-04-18 — End: 2013-04-22
  Administered 2013-04-19 – 2013-04-22 (×8): 1600 mg via ORAL
  Filled 2013-04-18 (×15): qty 2

## 2013-04-18 MED ORDER — INSULIN ASPART 100 UNIT/ML ~~LOC~~ SOLN
12.0000 [IU] | Freq: Once | SUBCUTANEOUS | Status: DC
Start: 2013-04-18 — End: 2013-04-18

## 2013-04-18 MED ORDER — MIDODRINE HCL 5 MG PO TABS
ORAL_TABLET | ORAL | Status: AC
  Filled 2013-04-18: qty 2

## 2013-04-18 MED ORDER — HEPARIN SODIUM (PORCINE) 1000 UNIT/ML IJ SOLN
10900.0000 [IU] | Freq: Once | INTRAMUSCULAR | Status: AC
  Administered 2013-04-18: 10900 [IU] via INTRAVENOUS

## 2013-04-18 NOTE — Progress Notes (Signed)
Pt refusing for bipap for by placed on him. Will ball up his fists and yell "No!"

## 2013-04-18 NOTE — Progress Notes (Signed)
[name]  KELLEE KNEECE UUE:[id number] DOB: 12-Nov-1945 DOA: 04/13/2013 PCP: [name], MD  Time spent :  35 minutes  Brief narrative: 68 year old M PMHx multiple medical problems including "fracture" of left hip 2 weeks ago unrepaired per wife (same hip S/P THA 1998), chronic kidney disease on dialysis T/Th/Sat, chronic hypoxemic respiratory failure due to COPD on 3 L nasal cannula oxygen at home, sleep apnea noncompliant with CPAP and diabetes. He also has orthostatic hypotension on dialysis days and is on chronic midodrine.  Patient apparently had been experiencing multiple falls and was having difficulty getting up despite assistance from his wife. On the date of admission he was more lethargic with respiratory difficulties and increasing hypoxemia despite his usual oxygen. Recent start by the [facility] on OxyIR and wife was concerned patient may have taken more than was prescribed.  Upon arrival to the ER patient had mild acidosis with hypercapnia and hypoxemia. Patient condition drastically improved with administration of Narcan and use of BiPAP.   HPI/Subjective: Pt is significantly more alert today, though not back to "normal" yet.  He has been refusing much of the care afforded him, and presently will not consent to even wearing Nelson oxygen.  No family is present at the time of my visit today.    Assessment/Plan:  Encephalopathy - acute -Ultram dc'd 4/16 - stopped Neurontin  -Suspect altered mental status due to noncompliance with CPAP + sedating meds + episodic bradycardia - continue BiPAP prn when pt will allow   Symptomatic bradycardia / [name] -bradycardia associated with hypotension and mental status changes -Cardiology consulted - rec Dopamine but hr/BP spontaneously recovered and Dopamine was not required -Cards thinks may require pacer but is not currently candidate due to possible bacteremia  -suspect this likely  contributing to frequent falls at home -dc'd Lopressor   Acute and chronic respiratory failure:   A) Baseline COPD (chronic obstructive pulmonary disease)   B) OSA/Obesity hypoventilation syndrome -responds to BIPAP during episodes of severe lethargy, but then refuses to wear once becomes more alert -not likley much that can be done to help him long term as long as he continues to be noncompliant  -pt non adherent with CPAP at home  Coag neg staph Bacteremia -1/2 bottles positive - likely contaminant but await final cx on #2 before consider final dx -follow up on 2nd set of blood cx's obtained 4/17 -likely a contaminant but since HD pt does have risk factors for true bacteremia   End stage renal disease on dialysis -as per Nephro  -T-Th-Sa usual HD days. -does not appear volume overloaded at this time  Opioid overdose / Chronic pain -unintentional and due to ongoing left hip pain (see below) -no narcs for now due to sedation issues     Reported Broken left hip prosthesis -THR 1997 in [place] -wife states pt falling- went to [place] and had XR left hip - [facility] says [facility] and stated pt had hip fracture at area of prosthesis and ?plan to FU for eventual surgical evaluation -left hip XR here without fracture but was portable - CT hip: Left total hip arthroplasty without hardware failure or complication. -per family pt had limited mobility post hip repair in 1997 progressive worsening of mobility - by 2006 essentially WC bound  DM2  -CBGs controlled  CAD   HTN -issues with orthostasis on HD days could be contributing to falls -Nephro has dc'd hydralazine  -Echo done  last May 2014 with preserved LV function - repeat TTE this admit confirms same   GERD   DVT prophylaxis: Subcutaneous heparin Code Status: Full Family Communication: no family present at time of exam today  Disposition Plan/Expected LOS: Stepdown - PT recommends SNF but pt with ACUTE encephalopathy so likely will  need add'l eval later  Consultants: Nephrology  Procedures: None  Antibiotics: None  Objective: Blood pressure 105/66, pulse 103, temperature 98.2 F (36.8 C), temperature source Oral, resp. rate 11, height 6' (1.829 m), weight 109.2 kg (240 lb 11.9 oz), SpO2 94.00%.  Intake/Output Summary (Last 24 hours) at 04/18/13 1401 Last data filed at 04/18/13 1127  Gross per 24 hour  Intake 443.75 ml  Output    652 ml  Net -208.25 ml   Exam: General: more alert - answers questions appropriately but dose not cooperate w/ exam  Lungs: Very distant breath sounds in all fields with no focal crackles or wheeze  Cardiovascular: Regular rate and rhythm without murmur gallop or rub normal S1 and S2, no peripheral edema - distant heart sounds Abdomen: Nontender, nondistended but is obese, soft, bowel sounds positive, no rebound, no ascites, no appreciable mass Musculoskeletal: No significant cyanosis, clubbing of bilateral lower extremities  Scheduled Meds:  Scheduled Meds: . artificial tears  1 application Right Eye BID  . aspirin  325 mg Oral Daily  . brimonidine  1 drop Both Eyes BID  . cinacalcet  60 mg Oral BID WC  . clopidogrel  75 mg Oral Q breakfast  . diphenhydrAMINE      . fluticasone  1 puff Inhalation BID  . heparin  5,000 Units Subcutaneous 3 times per day  . insulin aspart  0-9 Units Subcutaneous TID WC  . midodrine      . midodrine  10 mg Oral TID WC  . multivitamin  1 tablet Oral QHS  . pantoprazole  40 mg Oral Daily  . rOPINIRole  0.5 mg Oral QHS  . sevelamer carbonate  1,600 mg Oral TID WC  . simvastatin  20 mg Oral q1800  . cyanocobalamin  1,000 mcg Oral Daily   Data Reviewed: Basic Metabolic Panel:  Recent Labs Lab 04/13/13 2156  04/15/13 0245 04/16/13 0234 04/16/13 1852 04/17/13 0637 04/18/13 0400  NA  --   < > 139 135* 135* 135* 137  K  --   < > 4.3 4.8 3.8 4.0 4.4  CL  --   < > 94* 90* 94* 93* 94*  CO2  --   < > 24 25 25 26 25   GLUCOSE  --   < > 75  151* 98 82 109*  BUN  --   < > 30* 50* 22 27* 39*  CREATININE  --   < > 6.91* 8.52* 4.79* 5.88* 7.71*  CALCIUM  --   < > 9.8 9.9 9.6 10.3 10.7*  MG 1.9  --   --   --  1.7  --   --   PHOS 9.5*  --  7.0* 9.1*  --   --  7.9*  < > = values in this interval not displayed. Liver Function Tests:  Recent Labs Lab 04/13/13 1155 04/14/13 0324 04/15/13 0245 04/16/13 0234 04/16/13 1852 04/18/13 0400  AST 9 7  --   --  7  --   ALT 11 10  --   --  9  --   ALKPHOS 61 54  --   --  53  --   BILITOT  0.4 0.4  --   --  0.4  --   PROT 7.2 6.5  --   --  6.8  --   ALBUMIN 3.6 3.2* 2.9* 2.9* 2.8* 3.0*   CBC:  Recent Labs Lab 04/13/13 1155 04/13/13 1207 04/16/13 0230 04/18/13 0400  WBC 7.9  --  14.0* 9.8  NEUTROABS 4.9  --   --   --   HGB 11.9* 12.9* 10.3* 10.2*  HCT 36.4* 38.0* 32.7* 31.5*  MCV 97.1  --  98.2 96.3  PLT 210  --  234 246   Cardiac Enzymes:  Recent Labs Lab 04/13/13 1155 04/15/13 2347 04/16/13 1852 04/17/13 0637  TROPONINI <0.30 <0.30 <0.30 <0.30   CBG:  Recent Labs Lab 04/17/13 1655 04/17/13 1740 04/17/13 2128 04/17/13 2357 04/18/13 0802  GLUCAP 66* 159* 151* 155* 95    Recent Results (from the past 240 hour(s))  MRSA PCR SCREENING     Status: None   Collection Time    04/13/13  8:28 PM      Result Value Ref Range Status   MRSA by PCR NEGATIVE  NEGATIVE Final   Comment:            The GeneXpert MRSA Assay (FDA     approved for NASAL specimens     only), is one component of a     comprehensive MRSA colonization     surveillance program. It is not     intended to diagnose MRSA     infection nor to guide or     monitor treatment for     MRSA infections.  CULTURE, BLOOD (ROUTINE X 2)     Status: None   Collection Time    04/14/13  3:45 PM      Result Value Ref Range Status   Specimen Description BLOOD HEMODIALYSIS CATHETER   Final   Special Requests BOTTLES DRAWN AEROBIC AND ANAEROBIC 10CC   Final   Culture  Setup Time     Final   Value:  04/14/2013 18:46     Performed at [facility]   Culture     Final   Value:        BLOOD CULTURE RECEIVED NO GROWTH TO DATE CULTURE WILL BE HELD FOR 5 DAYS BEFORE ISSUING A FINAL NEGATIVE REPORT     Performed at [facility]   Report Status PENDING   Incomplete  CULTURE, BLOOD (ROUTINE X 2)     Status: None   Collection Time    04/14/13  3:55 PM      Result Value Ref Range Status   Specimen Description BLOOD HEMODIALYSIS CATHETER   Final   Special Requests BOTTLES DRAWN AEROBIC AND ANAEROBIC 10CC   Final   Culture  Setup Time     Final   Value: 04/14/2013 18:45     Performed at [facility]   Culture     Final   Value: STAPHYLOCOCCUS SPECIES (COAGULASE NEGATIVE)     Note: THE SIGNIFICANCE OF ISOLATING THIS ORGANISM FROM A SINGLE SET OF BLOOD CULTURES WHEN MULTIPLE SETS ARE DRAWN IS UNCERTAIN. PLEASE NOTIFY THE MICROBIOLOGY DEPARTMENT WITHIN ONE WEEK IF SPECIATION AND SENSITIVITIES ARE REQUIRED.     Note: Gram Stain Report Called to,Read Back By and Verified With: MAGGIE BAUGHM 04/15/13 1311 BY SMITHERSJ     Performed at [facility]   Report Status 04/16/2013 FINAL   Final  CULTURE, BLOOD (ROUTINE X 2)     Status: None  Collection Time    04/17/13 10:00 AM      Result Value Ref Range Status   Specimen Description BLOOD HEMODIALYSIS CATHETER   Final   Special Requests BOTTLES DRAWN AEROBIC AND ANAEROBIC 4CC   Final   Culture  Setup Time     Final   Value: 04/17/2013 16:08     Performed at [facility]   Culture     Final   Value:        BLOOD CULTURE RECEIVED NO GROWTH TO DATE CULTURE WILL BE HELD FOR 5 DAYS BEFORE ISSUING A FINAL NEGATIVE REPORT     Performed at [facility]   Report Status PENDING   Incomplete  CULTURE, BLOOD (ROUTINE X 2)     Status: None   Collection Time    04/17/13 11:06 AM      Result Value Ref Range Status   Specimen Description BLOOD LEFT ANTECUBITAL   Final   Special Requests BOTTLES DRAWN AEROBIC ONLY  10CC   Final   Culture  Setup Time     Final   Value: 04/17/2013 16:08     Performed at [facility]   Culture     Final   Value:        BLOOD CULTURE RECEIVED NO GROWTH TO DATE CULTURE WILL BE HELD FOR 5 DAYS BEFORE ISSUING A FINAL NEGATIVE REPORT     Performed at [facility]   Report Status PENDING   Incomplete     Studies:  Recent x-ray studies have been reviewed in detail by the Attending Physician  Lonia Blood, MD [facility] For Consults/Admissions - Flow Manager - [phone] Office  [phone] Pager [phone]  On-Call/Text Page:      [email or web address]      password [email or web address]  If 7PM-7AM, please contact night-coverage [email or web address] Password [email or web address] 04/18/2013, 2:01 PM   LOS: 5 days

## 2013-04-18 NOTE — Progress Notes (Signed)
Pt returned back from dialysis and immediately on entering room pt was aggressive towards staff and balling fists up. He doesn't want oxygen on or will not let nursing staff obtain blood pressure. Will continue to monitor.

## 2013-04-18 NOTE — Procedures (Signed)
Pt seen on HD. Ap 210 Vp 290.  BFR 400.  More alert today.  He is Ox3 though not quit back to baseline.

## 2013-04-18 NOTE — Progress Notes (Signed)
RT attempted ABG twice and was unsuccessful.  Patient flinches and moves arm while RT trying to stick.  Second stick RT got venous blood instead of arterial.  Patient does not want to be stuck again.  Paged MD Burnadette Peter twice to try to get order dc'ed or changed to a venous gas.  Page was not returned.  RN [place] is aware.  RT had to throw blood away due to time that had went by.  Will await page from MD to clarify what he wants to do.

## 2013-04-18 NOTE — Progress Notes (Signed)
Pt is refusing for staff to obtain CBG and BP and still refusing oxygen.

## 2013-04-19 LAB — GLUCOSE, CAPILLARY
GLUCOSE-CAPILLARY: 103 mg/dL — AB (ref 70–99)
Glucose-Capillary: 91 mg/dL (ref 70–99)
Glucose-Capillary: 143 mg/dL — ABNORMAL HIGH (ref 70–99)
Glucose-Capillary: 150 mg/dL — ABNORMAL HIGH (ref 70–99)
Glucose-Capillary: 152 mg/dL — ABNORMAL HIGH (ref 70–99)
Glucose-Capillary: 158 mg/dL — ABNORMAL HIGH (ref 70–99)

## 2013-04-19 MED ORDER — DARBEPOETIN ALFA-POLYSORBATE 100 MCG/0.5ML IJ SOLN
100.0000 ug | INTRAMUSCULAR | Status: DC
  Administered 2013-04-21: 100 ug via INTRAVENOUS
  Filled 2013-04-19: qty 0.5

## 2013-04-19 NOTE — Progress Notes (Signed)
[name]  [name] ZOX:[id number] DOB: 11/11/1945 DOA: 04/13/2013 PCP: Dagoberto LigasPATEL,JAY K., MD  Time spent :  25 minutes  Brief narrative: 68 year old M PMHx multiple medical problems including "fracture" of left hip 2 weeks ago unrepaired per wife (same hip S/P THA 1998), chronic kidney disease on dialysis T/Th/Sat, chronic hypoxemic respiratory failure due to COPD on 3 L nasal cannula oxygen at home, sleep apnea noncompliant with CPAP and diabetes. He also has orthostatic hypotension on dialysis days and is on chronic midodrine.  Patient apparently had been experiencing multiple falls and was having difficulty getting up despite assistance from his wife. On the date of admission he was more lethargic with respiratory difficulties and increasing hypoxemia despite his usual oxygen. [facility] and wife was concerned patient may have taken more than was prescribed.  Upon arrival to the ER patient had mild acidosis with hypercapnia and hypoxemia. Patient condition drastically improved with administration of Narcan and use of BiPAP.   HPI/Subjective: Laying partially on bedpan when I enter room.  Is certainly more alert, but remains confused.  When I ask if he is trying to urinate or move his bowels, he responds "neither - I gotta piss."  He will not answer other questions regarding orientation.    Assessment/Plan:  Encephalopathy - acute -Ultram dc'd 4/16 - stopped Neurontin  -Suspect altered mental status due to noncompliance with CPAP + sedating meds + episodic bradycardia - continue BiPAP prn when pt will allow, though he refuses virtually any time he is alert enough to do so   Symptomatic bradycardia / Josue HectorWeinkebach -bradycardia associated with hypotension and mental status changes -Cardiology consulted - rec Dopamine but hr/BP spontaneously recovered and Dopamine was not required -Cards thinks may require pacer but is  not currently candidate due to possible bacteremia  -suspect this likely contributing to frequent falls at home -dc'd Lopressor   Acute and chronic respiratory failure:   A) Baseline COPD (chronic obstructive pulmonary disease)   B) OSA/Obesity hypoventilation syndrome -responds to BIPAP during episodes of severe lethargy, but then refuses to wear once becomes more alert -not likley much that can be done to help him long term as long as he continues to be noncompliant  -pt non adherent with CPAP at home  Coag neg staph Bacteremia -now 1/6 bottles positive - likely contaminant  -likely a contaminant but since HD pt does have risk factors for true bacteremia - follow out to 5 full days of surveillance   End stage renal disease on dialysis -as per Nephro  -T-Th-Sa usual HD days. -does not appear volume overloaded at this time  Opioid overdose / Chronic pain -unintentional and due to ongoing left hip pain (see below) -no narcs for now due to sedation issues     Reported Broken left hip prosthesis -THR 1997 [place] -[place] states [place] falling- [facility] says [facility] and [facility] had [facility] fracture at area of prosthesis and ?plan to FU for eventual surgical evaluation -left hip XR here without fracture but was portable - CT hip: Left total hip arthroplasty without hardware failure or complication. -per family pt had limited mobility post hip repair in 1997 progressive worsening of mobility - by 2006 essentially WC bound  DM2  -CBGs controlled  CAD   HTN -issues with orthostasis on HD days could be contributing to falls -Nephro has  dc'd hydralazine  -Echo done last May 2014 with preserved LV function - repeat TTE this admit confirms same   GERD   DVT prophylaxis: Subcutaneous heparin Code Status: Full Family Communication: no family present at time of exam today  Disposition Plan/Expected LOS: Stepdown - PT recommends SNF - will discuss difficult  situation w/ family this week - pt needs higher level of care but will clearly refuse same and will not thrive at home where he will simply not comply w/ tx   Consultants: Nephrology  Procedures: None  Antibiotics: None  Objective: Blood pressure 132/48, pulse 86, temperature 98.3 F (36.8 C), temperature source Axillary, resp. rate 19, height 6' (1.829 m), weight 108.8 kg (239 lb 13.8 oz), SpO2 92.00%.  Intake/Output Summary (Last 24 hours) at 04/19/13 1754 Last data filed at 04/19/13 1610  Gross per 24 hour  Intake    540 ml  Output      0 ml  Net    540 ml   Exam: General: more alert - no apparent distress  Lungs: Very distant breath sounds in all fields with no focal crackles or wheeze  Cardiovascular: Regular rate and rhythm without murmur gallop or rub - distant heart sounds Abdomen: Nontender, nondistended but is obese, soft, bowel sounds positive, no rebound, no ascites, no appreciable mass Musculoskeletal: No significant cyanosis, clubbing of bilateral lower extremities  Scheduled Meds:  Scheduled Meds: . artificial tears  1 application Right Eye BID  . aspirin  325 mg Oral Daily  . brimonidine  1 drop Both Eyes BID  . cinacalcet  60 mg Oral BID WC  . clopidogrel  75 mg Oral Q breakfast  . [START ON 04/21/2013] darbepoetin (ARANESP) injection - DIALYSIS  100 mcg Intravenous Q Tue-HD  . fluticasone  1 puff Inhalation BID  . heparin  5,000 Units Subcutaneous 3 times per day  . insulin aspart  0-9 Units Subcutaneous TID WC  . midodrine  10 mg Oral TID WC  . multivitamin  1 tablet Oral QHS  . pantoprazole  40 mg Oral Daily  . rOPINIRole  0.5 mg Oral QHS  . sevelamer carbonate  1,600 mg Oral TID WC  . simvastatin  20 mg Oral q1800  . cyanocobalamin  1,000 mcg Oral Daily   Data Reviewed: Basic Metabolic Panel:  Recent Labs Lab 04/13/13 2156  04/15/13 0245 04/16/13 0234 04/16/13 1852 04/17/13 0637 04/18/13 0400 04/18/13 1632  NA  --   < > 139 135* 135*  135* 137 134*  K  --   < > 4.3 4.8 3.8 4.0 4.4 3.6*  CL  --   < > 94* 90* 94* 93* 94* 94*  CO2  --   < > 24 25 25 26 25 23   GLUCOSE  --   < > 75 151* 98 82 109* 82  BUN  --   < > 30* 50* 22 27* 39* 20  CREATININE  --   < > 6.91* 8.52* 4.79* 5.88* 7.71* 4.94*  CALCIUM  --   < > 9.8 9.9 9.6 10.3 10.7* 10.3  MG 1.9  --   --   --  1.7  --   --   --   PHOS 9.5*  --  7.0* 9.1*  --   --  7.9*  --   < > = values in this interval not displayed. Liver Function Tests:  Recent Labs Lab 04/13/13 1155 04/14/13 0324 04/15/13 0245 04/16/13 0234 04/16/13 1852 04/18/13 0400  AST  9 7  --   --  7  --   ALT 11 10  --   --  9  --   ALKPHOS 61 54  --   --  53  --   BILITOT 0.4 0.4  --   --  0.4  --   PROT 7.2 6.5  --   --  6.8  --   ALBUMIN 3.6 3.2* 2.9* 2.9* 2.8* 3.0*   CBC:  Recent Labs Lab 04/13/13 1155 04/13/13 1207 04/16/13 0230 04/18/13 0400  WBC 7.9  --  14.0* 9.8  NEUTROABS 4.9  --   --   --   HGB 11.9* 12.9* 10.3* 10.2*  HCT 36.4* 38.0* 32.7* 31.5*  MCV 97.1  --  98.2 96.3  PLT 210  --  234 246   Cardiac Enzymes:  Recent Labs Lab 04/13/13 1155 04/15/13 2347 04/16/13 1852 04/17/13 0637  TROPONINI <0.30 <0.30 <0.30 <0.30   CBG:  Recent Labs Lab 04/18/13 2331 04/19/13 0351 04/19/13 0834 04/19/13 1112 04/19/13 1541  GLUCAP 103* 91 143* 150* 152*    Recent Results (from the past 240 hour(s))  MRSA PCR SCREENING     Status: None   Collection Time    04/13/13  8:28 PM      Result Value Ref Range Status   MRSA by PCR NEGATIVE  NEGATIVE Final   Comment:            The GeneXpert MRSA Assay (FDA     approved for NASAL specimens     only), is one component of a     comprehensive MRSA colonization     surveillance program. It is not     intended to diagnose MRSA     infection nor to guide or     monitor treatment for     MRSA infections.  CULTURE, BLOOD (ROUTINE X 2)     Status: None   Collection Time    04/14/13  3:45 PM      Result Value Ref Range Status    Specimen Description BLOOD HEMODIALYSIS CATHETER   Final   Special Requests BOTTLES DRAWN AEROBIC AND ANAEROBIC 10CC   Final   Culture  Setup Time     Final   Value: 04/14/2013 18:46     Performed at [facility]   Culture     Final   Value:        BLOOD CULTURE RECEIVED NO GROWTH TO DATE CULTURE WILL BE HELD FOR 5 DAYS BEFORE ISSUING A FINAL NEGATIVE REPORT     Performed at [facility]   Report Status PENDING   Incomplete  CULTURE, BLOOD (ROUTINE X 2)     Status: None   Collection Time    04/14/13  3:55 PM      Result Value Ref Range Status   Specimen Description BLOOD HEMODIALYSIS CATHETER   Final   Special Requests BOTTLES DRAWN AEROBIC AND ANAEROBIC 10CC   Final   Culture  Setup Time     Final   Value: 04/14/2013 18:45     Performed at [facility]   Culture     Final   Value: STAPHYLOCOCCUS SPECIES (COAGULASE NEGATIVE)     Note: THE SIGNIFICANCE OF ISOLATING THIS ORGANISM FROM A SINGLE SET OF BLOOD CULTURES WHEN MULTIPLE SETS ARE DRAWN IS UNCERTAIN. PLEASE NOTIFY THE MICROBIOLOGY DEPARTMENT WITHIN ONE WEEK IF SPECIATION AND SENSITIVITIES ARE REQUIRED.     Note: Gram Stain Report Called to,Read  Back By and Verified With: Yong Channel 04/15/13 1311 BY SMITHERSJ     Performed at [facility]   Report Status 04/16/2013 FINAL   Final  CULTURE, BLOOD (ROUTINE X 2)     Status: None   Collection Time    04/17/13 10:00 AM      Result Value Ref Range Status   Specimen Description BLOOD HEMODIALYSIS CATHETER   Final   Special Requests BOTTLES DRAWN AEROBIC AND ANAEROBIC 4CC   Final   Culture  Setup Time     Final   Value: 04/17/2013 16:08     Performed at [facility]   Culture     Final   Value:        BLOOD CULTURE RECEIVED NO GROWTH TO DATE CULTURE WILL BE HELD FOR 5 DAYS BEFORE ISSUING A FINAL NEGATIVE REPORT     Performed at [facility]   Report Status PENDING   Incomplete  CULTURE, BLOOD (ROUTINE X 2)     Status: None    Collection Time    04/17/13 11:06 AM      Result Value Ref Range Status   Specimen Description BLOOD LEFT ANTECUBITAL   Final   Special Requests BOTTLES DRAWN AEROBIC ONLY 10CC   Final   Culture  Setup Time     Final   Value: 04/17/2013 16:08     Performed at [facility]   Culture     Final   Value:        BLOOD CULTURE RECEIVED NO GROWTH TO DATE CULTURE WILL BE HELD FOR 5 DAYS BEFORE ISSUING A FINAL NEGATIVE REPORT     Performed at [facility]   Report Status PENDING   Incomplete  CULTURE, BLOOD (ROUTINE X 2)     Status: None   Collection Time    04/18/13  7:54 AM      Result Value Ref Range Status   Specimen Description BLOOD RIGHT ARM   Final   Special Requests BOTTLES DRAWN AEROBIC ONLY 10CC   Final   Culture  Setup Time     Final   Value: 04/18/2013 17:20     Performed at [facility]   Culture     Final   Value:        BLOOD CULTURE RECEIVED NO GROWTH TO DATE CULTURE WILL BE HELD FOR 5 DAYS BEFORE ISSUING A FINAL NEGATIVE REPORT     Performed at [facility]   Report Status PENDING   Incomplete  CULTURE, BLOOD (ROUTINE X 2)     Status: None   Collection Time    04/18/13  8:45 AM      Result Value Ref Range Status   Specimen Description BLOOD HEMODIALYSIS CATHETER   Final   Special Requests BOTTLES DRAWN AEROBIC AND ANAEROBIC 10CC   Final   Culture  Setup Time     Final   Value: 04/18/2013 17:20     Performed at [facility]   Culture     Final   Value:        BLOOD CULTURE RECEIVED NO GROWTH TO DATE CULTURE WILL BE HELD FOR 5 DAYS BEFORE ISSUING A FINAL NEGATIVE REPORT     Performed at [facility]   Report Status PENDING   Incomplete     Studies:  Recent x-ray studies have been reviewed in detail by the Attending Physician  Lonia Blood, MD [facility] For Consults/Admissions - Flow Manager -  [phone] Office  [phone] Pager [phone]  On-Call/Text Page:      [email or web address]      password  [email or web address]  If 7PM-7AM, please contact night-coverage [email or web address] Password [email or web address] 04/19/2013, 5:54 PM   LOS: 6 days

## 2013-04-19 NOTE — Progress Notes (Signed)
S: I feel good.  Can I go home? O:BP 138/58  Pulse 103  Temp(Src) 98.3 F (36.8 C) (Axillary)  Resp 17  Ht 6' (1.829 m)  Wt 108.8 kg (239 lb 13.8 oz)  BMI 32.52 kg/m2  SpO2 93%  Intake/Output Summary (Last 24 hours) at 04/19/13 0844 Last data filed at 04/19/13 0600  Gross per 24 hour  Intake 383.75 ml  Output    652 ml  Net -268.25 ml   Weight change: 0.1 kg (3.5 oz) Gen: Awake and alert CVS: Sl tachy, reg Resp: clear  Abd:+ BS  Soft NT ND  Ext: no edema NEURO: Ox3 no asterixis Lt IJ permcath   . artificial tears  1 application Right Eye BID  . aspirin  325 mg Oral Daily  . brimonidine  1 drop Both Eyes BID  . cinacalcet  60 mg Oral BID WC  . clopidogrel  75 mg Oral Q breakfast  . fluticasone  1 puff Inhalation BID  . heparin  5,000 Units Subcutaneous 3 times per day  . insulin aspart  0-9 Units Subcutaneous TID WC  . midodrine  10 mg Oral TID WC  . multivitamin  1 tablet Oral QHS  . pantoprazole  40 mg Oral Daily  . rOPINIRole  0.5 mg Oral QHS  . sevelamer carbonate  1,600 mg Oral TID WC  . simvastatin  20 mg Oral q1800  . cyanocobalamin  1,000 mcg Oral Daily   Dg Chest Port 1 View  04/17/2013   CLINICAL DATA:  Respiratory distress.  EXAM: PORTABLE CHEST - 1 VIEW  COMPARISON:  DG CHEST 2 VIEW dated 04/13/2013  FINDINGS: Left IJ dialysis catheter tip projects over the SVC or SVC RA junction. Cardiomediastinal silhouette is prominent but stable. Thoracic aorta is calcified. Lungs are grossly clear. No pleural fluid.  IMPRESSION: Image quality is degraded by body habitus.  Lungs are grossly clear.   Electronically Signed   By: Leanna Battles M.D.   On: 04/17/2013 20:47   BMET    Component Value Date/Time   NA 134* 04/18/2013 1632   K 3.6* 04/18/2013 1632   CL 94* 04/18/2013 1632   CO2 23 04/18/2013 1632   GLUCOSE 82 04/18/2013 1632   BUN 20 04/18/2013 1632   CREATININE 4.94* 04/18/2013 1632   CALCIUM 10.3 04/18/2013 1632   CALCIUM 10.2 08/31/2009 0850   GFRNONAA 11*  04/18/2013 1632   GFRAA 13* 04/18/2013 1632   CBC    Component Value Date/Time   WBC 9.8 04/18/2013 0400   RBC 3.27* 04/18/2013 0400   HGB 10.2* 04/18/2013 0400   HCT 31.5* 04/18/2013 0400   PLT 246 04/18/2013 0400   MCV 96.3 04/18/2013 0400   MCH 31.2 04/18/2013 0400   MCHC 32.4 04/18/2013 0400   RDW 12.3 04/18/2013 0400   LYMPHSABS 1.7 04/13/2013 1155   MONOABS 1.1* 04/13/2013 1155   EOSABS 0.2 04/13/2013 1155   BASOSABS 0.1 04/13/2013 1155     Assessment: 1. Hypercapneic resp failure poss sec to narcotics, improved 2. Mild hypercalcemia 3. 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 DM 5. COPD 6. ESRD 7. Anemia  EPO on hold 8.  Episode of bradycardia, NSR now 9. 1/6 BC + for coag neg staph, suspect this is a contaminant but since he has a catheter will get BC thru HD catheter. Plan: 1. HD Tuesday 2 Resume ESA   Dyke Maes

## 2013-04-20 ENCOUNTER — Inpatient Hospital Stay (HOSPITAL_COMMUNITY): Payer: Medicare Other

## 2013-04-20 LAB — GLUCOSE, CAPILLARY
GLUCOSE-CAPILLARY: 120 mg/dL — AB (ref 70–99)
GLUCOSE-CAPILLARY: 120 mg/dL — AB (ref 70–99)
Glucose-Capillary: 125 mg/dL — ABNORMAL HIGH (ref 70–99)
Glucose-Capillary: 165 mg/dL — ABNORMAL HIGH (ref 70–99)

## 2013-04-20 LAB — CULTURE, BLOOD (ROUTINE X 2): Culture: NO GROWTH

## 2013-04-20 MED ORDER — LEVOFLOXACIN 500 MG PO TABS: 500.0000 mg | ORAL_TABLET | ORAL | Status: DC

## 2013-04-20 MED ORDER — NEPRO/CARBSTEADY PO LIQD: 237.0000 mL | ORAL | Status: DC | PRN

## 2013-04-20 MED ORDER — SODIUM CHLORIDE 0.9 % IV SOLN: 100.0000 mL | INTRAVENOUS | Status: DC | PRN

## 2013-04-20 MED ORDER — HEPARIN SODIUM (PORCINE) 1000 UNIT/ML DIALYSIS
8000.0000 [IU] | Freq: Once | INTRAMUSCULAR | Status: DC
  Filled 2013-04-20: qty 8

## 2013-04-20 MED ORDER — LIDOCAINE HCL (PF) 1 % IJ SOLN: 5.0000 mL | INTRAMUSCULAR | Status: DC | PRN

## 2013-04-20 MED ORDER — HEPARIN SODIUM (PORCINE) 1000 UNIT/ML DIALYSIS
1000.0000 [IU] | INTRAMUSCULAR | Status: DC | PRN
  Filled 2013-04-20: qty 1

## 2013-04-20 MED ORDER — LIDOCAINE-PRILOCAINE 2.5-2.5 % EX CREA: 1.0000 "application " | TOPICAL_CREAM | CUTANEOUS | Status: DC | PRN

## 2013-04-20 MED ORDER — ALTEPLASE 2 MG IJ SOLR
2.0000 mg | Freq: Once | INTRAMUSCULAR | Status: AC | PRN
  Filled 2013-04-20: qty 2

## 2013-04-20 MED ORDER — PENTAFLUOROPROP-TETRAFLUOROETH EX AERO: 1.0000 "application " | INHALATION_SPRAY | CUTANEOUS | Status: DC | PRN

## 2013-04-20 MED ORDER — LEVOFLOXACIN 750 MG PO TABS
750.0000 mg | ORAL_TABLET | Freq: Once | ORAL | Status: AC
  Administered 2013-04-20: 750 mg via ORAL
  Filled 2013-04-20: qty 1

## 2013-04-20 NOTE — Progress Notes (Signed)
[name]  [name] WJX:[id number] DOB: 1945-12-18 DOA: 04/13/2013 PCP: [name], MD  Time spent :  25 minutes  Brief narrative: 68 year old M PMHx multiple medical problems including "fracture" of left hip 2 weeks ago unrepaired per wife (same hip S/P THA 1998), chronic kidney disease on dialysis T/Th/Sat, chronic hypoxemic respiratory failure due to COPD on 3 L nasal cannula oxygen at home, sleep apnea noncompliant with CPAP, and diabetes. He also has orthostatic hypotension on dialysis days and is on chronic midodrine.  Patient apparently had been experiencing multiple falls and was having difficulty getting up despite assistance from his wife. On the date of admission he was more lethargic with respiratory difficulties and increasing hypoxemia despite his usual oxygen. Recent start by the [facility] on OxyIR and wife was concerned patient may have taken more than was prescribed.  Upon arrival to the ER patient had mild acidosis with hypercapnia and hypoxemia. Patient condition drastically improved with administration of Narcan and use of BiPAP.   HPI/Subjective: Laying partially on bedpan when I enter room.  Is certainly more alert, but remains confused.  When I ask if he is trying to urinate or move his bowels, he responds "neither - I gotta piss."  He will not answer other questions regarding orientation.    Assessment/Plan:  Encephalopathy - acute -Ultram dc'd 4/16 - stopped Neurontin  -Suspect altered mental status due to noncompliance with CPAP + sedating meds + episodic bradycardia - continue BiPAP prn when pt will allow, though he refuses virtually any time he is alert enough to do so   ?LLL infiltrate - ?Pneumonia -afebrile - no WBC elevation - nonetheless, the pt does c/o cough, and CXR from 4/17 does in fact suggest possible LLL infiltrate v/s atx - will give trial of oral abx for now, and recheck CXR in AM  Symptomatic  bradycardia / [name] -bradycardia was associated with hypotension and mental status changes -Cardiology consulted - rec Dopamine but hr/BP spontaneously recovered and Dopamine was not required -suspect this was likely contributing to frequent falls at home -dc'd Lopressor - pt now frequently refusing to wear tele therefore further eval has been impaired  Acute and chronic respiratory failure:   A) Baseline COPD (chronic obstructive pulmonary disease)   B) OSA/Obesity hypoventilation syndrome -responds to BIPAP during episodes of severe lethargy, but then refuses to wear once becomes more alert -not likley much that can be done to help him long term as long as he continues to be noncompliant  -pt non adherent with CPAP at home -currently refusing to wear even his oxygen   Coag neg staph Bacteremia -now only 1/6 bottles positive thus far - likely contaminant  -since HD pt does have risk factors for true bacteremia - follow out to 5 full days of surveillance   End stage renal disease on dialysis -as per Nephro  -T-Th-Sa usual HD days. -does not appear volume overloaded at this time - for HD in AM   Opioid overdose / Chronic pain -unintentional and due to ongoing left hip pain (see below) -no narcs for now due to sedation issues - would advise against prescribing narcotics at d/c due to high risk for resp arrest     Reported Broken left hip prosthesis -THR 1997 in [place] -wife states pt falling- went to [place] and had XR left hip - [facility] and stated [facility] had hip fracture at area of prosthesis and ?  plan to FU for eventual surgical evaluation -left hip XR here without fracture but was portable - CT hip: Left total hip arthroplasty without hardware failure or complication. -per family pt had limited mobility post hip repair in 1997 progressive worsening of mobility - by 2006 essentially WC bound  DM2  -CBGs controlled  Obesity - Body mass index is 32.46 kg/(m^2).  CAD    HTN -issues with orthostasis on HD days could be contributing to falls -Nephro has dc'd hydralazine  -Echo done last May 2014 with preserved LV function - repeat TTE this admit confirms same  -pt has been refusing vitals checks today so unclear what status of his BP is currently   GERD   DVT prophylaxis: Subcutaneous heparin Code Status: Full Family Communication: no family present at time of exam today  Disposition Plan/Expected LOS: PT recommends SNF - pt needs higher level of care but will clearly refuse same and will not thrive at home where he will simply not comply w/ tx - transfer to medical bed - if pt unwilling to go to SNF, will tentatively plan to d/c home 4/21 after HD as pt is not benefiting from being in the hospital in that he is refusing all care (to include even vitals)  Consultants: Nephrology  Procedures: None  Antibiotics: Levaquin 4/20 >>  Objective: Blood pressure 172/71, pulse 86, temperature 98.2 F (36.8 C), temperature source Oral, resp. rate 15, height 6' (1.829 m), weight 108.6 kg (239 lb 6.7 oz), SpO2 98.00%.  Intake/Output Summary (Last 24 hours) at 04/20/13 1555 Last data filed at 04/19/13 2200  Gross per 24 hour  Intake    120 ml  Output      0 ml  Net    120 ml   Exam: General: more alert today - will answer 3-4 questions, but then appears to simply be ignoring MD  Lungs: Very distant breath sounds in all fields with no focal crackles or wheeze  Cardiovascular: Regular rate and rhythm without murmur gallop or rub - distant heart sounds Abdomen: morbidly obese, nontender, nondistended but is obese, soft, bowel sounds positive, no rebound, no ascites, no appreciable mass Musculoskeletal: No significant cyanosis, clubbing of bilateral lower extremities  Scheduled Meds:  Scheduled Meds: . artificial tears  1 application Right Eye BID  . aspirin  325 mg Oral Daily  . brimonidine  1 drop Both Eyes BID  . cinacalcet  60 mg Oral BID WC  .  clopidogrel  75 mg Oral Q breakfast  . [START ON 04/21/2013] darbepoetin (ARANESP) injection - DIALYSIS  100 mcg Intravenous Q Tue-HD  . fluticasone  1 puff Inhalation BID  . heparin  5,000 Units Subcutaneous 3 times per day  . insulin aspart  0-9 Units Subcutaneous TID WC  . midodrine  10 mg Oral TID WC  . multivitamin  1 tablet Oral QHS  . pantoprazole  40 mg Oral Daily  . rOPINIRole  0.5 mg Oral QHS  . sevelamer carbonate  1,600 mg Oral TID WC  . simvastatin  20 mg Oral q1800  . cyanocobalamin  1,000 mcg Oral Daily   Data Reviewed: Basic Metabolic Panel:  Recent Labs Lab 04/13/13 2156  04/15/13 0245 04/16/13 0234 04/16/13 1852 04/17/13 0637 04/18/13 0400 04/18/13 1632  NA  --   < > 139 135* 135* 135* 137 134*  K  --   < > 4.3 4.8 3.8 4.0 4.4 3.6*  CL  --   < > 94* 90* 94* 93*  94* 94*  CO2  --   < > 24 25 25 26 25 23   GLUCOSE  --   < > 75 151* 98 82 109* 82  BUN  --   < > 30* 50* 22 27* 39* 20  CREATININE  --   < > 6.91* 8.52* 4.79* 5.88* 7.71* 4.94*  CALCIUM  --   < > 9.8 9.9 9.6 10.3 10.7* 10.3  MG 1.9  --   --   --  1.7  --   --   --   PHOS 9.5*  --  7.0* 9.1*  --   --  7.9*  --   < > = values in this interval not displayed.  Liver Function Tests:  Recent Labs Lab 04/14/13 0324 04/15/13 0245 04/16/13 0234 04/16/13 1852 04/18/13 0400  AST 7  --   --  7  --   ALT 10  --   --  9  --   ALKPHOS 54  --   --  53  --   BILITOT 0.4  --   --  0.4  --   PROT 6.5  --   --  6.8  --   ALBUMIN 3.2* 2.9* 2.9* 2.8* 3.0*   CBC:  Recent Labs Lab 04/16/13 0230 04/18/13 0400  WBC 14.0* 9.8  HGB 10.3* 10.2*  HCT 32.7* 31.5*  MCV 98.2 96.3  PLT 234 246   Cardiac Enzymes:  Recent Labs Lab 04/15/13 2347 04/16/13 1852 04/17/13 0637  TROPONINI <0.30 <0.30 <0.30   CBG:  Recent Labs Lab 04/19/13 1541 04/19/13 1938 04/19/13 2359 04/20/13 0744 04/20/13 1132  GLUCAP 152* 158* 165* 120* 125*    Recent Results (from the past 240 hour(s))  MRSA PCR SCREENING      Status: None   Collection Time    04/13/13  8:28 PM      Result Value Ref Range Status   MRSA by PCR NEGATIVE  NEGATIVE Final   Comment:            The GeneXpert MRSA Assay (FDA     approved for NASAL specimens     only), is one component of a     comprehensive MRSA colonization     surveillance program. It is not     intended to diagnose MRSA     infection nor to guide or     monitor treatment for     MRSA infections.  CULTURE, BLOOD (ROUTINE X 2)     Status: None   Collection Time    04/14/13  3:45 PM      Result Value Ref Range Status   Specimen Description BLOOD HEMODIALYSIS CATHETER   Final   Special Requests BOTTLES DRAWN AEROBIC AND ANAEROBIC 10CC   Final   Culture  Setup Time     Final   Value: 04/14/2013 18:46     Performed at [facility]   Culture     Final   Value: NO GROWTH 5 DAYS     Performed at [facility]   Report Status 04/20/2013 FINAL   Final  CULTURE, BLOOD (ROUTINE X 2)     Status: None   Collection Time    04/14/13  3:55 PM      Result Value Ref Range Status   Specimen Description BLOOD HEMODIALYSIS CATHETER   Final   Special Requests BOTTLES DRAWN AEROBIC AND ANAEROBIC 10CC   Final   Culture  Setup Time  Final   Value: 04/14/2013 18:45     Performed at [facility]   Culture     Final   Value: STAPHYLOCOCCUS SPECIES (COAGULASE NEGATIVE)     Note: THE SIGNIFICANCE OF ISOLATING THIS ORGANISM FROM A SINGLE SET OF BLOOD CULTURES WHEN MULTIPLE SETS ARE DRAWN IS UNCERTAIN. PLEASE NOTIFY THE MICROBIOLOGY DEPARTMENT WITHIN ONE WEEK IF SPECIATION AND SENSITIVITIES ARE REQUIRED.     Note: Gram Stain Report Called to,Read Back By and Verified With: MAGGIE BAUGHM 04/15/13 1311 BY SMITHERSJ     Performed at [facility]   Report Status 04/16/2013 FINAL   Final  CULTURE, BLOOD (ROUTINE X 2)     Status: None   Collection Time    04/17/13 10:00 AM      Result Value Ref Range Status   Specimen Description BLOOD HEMODIALYSIS  CATHETER   Final   Special Requests BOTTLES DRAWN AEROBIC AND ANAEROBIC 4CC   Final   Culture  Setup Time     Final   Value: 04/17/2013 16:08     Performed at [facility]   Culture     Final   Value:        BLOOD CULTURE RECEIVED NO GROWTH TO DATE CULTURE WILL BE HELD FOR 5 DAYS BEFORE ISSUING A FINAL NEGATIVE REPORT     Performed at [facility]   Report Status PENDING   Incomplete  CULTURE, BLOOD (ROUTINE X 2)     Status: None   Collection Time    04/17/13 11:06 AM      Result Value Ref Range Status   Specimen Description BLOOD LEFT ANTECUBITAL   Final   Special Requests BOTTLES DRAWN AEROBIC ONLY 10CC   Final   Culture  Setup Time     Final   Value: 04/17/2013 16:08     Performed at [facility]   Culture     Final   Value:        BLOOD CULTURE RECEIVED NO GROWTH TO DATE CULTURE WILL BE HELD FOR 5 DAYS BEFORE ISSUING A FINAL NEGATIVE REPORT     Performed at [facility]   Report Status PENDING   Incomplete  CULTURE, BLOOD (ROUTINE X 2)     Status: None   Collection Time    04/18/13  7:54 AM      Result Value Ref Range Status   Specimen Description BLOOD RIGHT ARM   Final   Special Requests BOTTLES DRAWN AEROBIC ONLY 10CC   Final   Culture  Setup Time     Final   Value: 04/18/2013 17:20     Performed at [facility]   Culture     Final   Value:        BLOOD CULTURE RECEIVED NO GROWTH TO DATE CULTURE WILL BE HELD FOR 5 DAYS BEFORE ISSUING A FINAL NEGATIVE REPORT     Performed at [facility]   Report Status PENDING   Incomplete  CULTURE, BLOOD (ROUTINE X 2)     Status: None   Collection Time    04/18/13  8:45 AM      Result Value Ref Range Status   Specimen Description BLOOD HEMODIALYSIS CATHETER   Final   Special Requests BOTTLES DRAWN AEROBIC AND ANAEROBIC 10CC   Final   Culture  Setup Time     Final   Value: 04/18/2013 17:20     Performed at [facility]  Final   Value:        BLOOD CULTURE RECEIVED  NO GROWTH TO DATE CULTURE WILL BE HELD FOR 5 DAYS BEFORE ISSUING A FINAL NEGATIVE REPORT     Performed at [facility]   Report Status PENDING   Incomplete     Studies:  Recent x-ray studies have been reviewed in detail by the Attending Physician  Lonia Blood, MD [facility] For Consults/Admissions - Flow Manager - [phone] Office  [phone] Pager [phone]  On-Call/Text Page:      [email or web address]      password [email or web address]  If 7PM-7AM, please contact night-coverage [email or web address] Password [email or web address] 04/20/2013, 3:55 PM   LOS: 7 days

## 2013-04-20 NOTE — Progress Notes (Signed)
ANTIBIOTIC CONSULT NOTE - INITIAL  Pharmacy Consult:  Levaquin Indication:  HCAP  Allergies  Allergen Reactions  . Cefepime Itching  . Penicillins Hives and Swelling    Patient Measurements: Height: 6' (182.9 cm) Weight: 239 lb 6.7 oz (108.6 kg) IBW/kg (Calculated) : 77.6  Vital Signs: Temp: 98.2 F (36.8 C) (04/20 1551) Temp src: Oral (04/20 1551) BP: 192/71 mmHg (04/20 1551) Pulse Rate: 86 (04/20 0747) Intake/Output from previous day: 04/19 0701 - 04/20 0700 In: 360 [P.O.:360] Out: -  Intake/Output from this shift: Total I/O In: -  Out: 1 [Stool:1]  Labs:  Recent Labs  04/18/13 0400 04/18/13 1632  WBC 9.8  --   HGB 10.2*  --   PLT 246  --   CREATININE 7.71* 4.94*   Estimated Creatinine Clearance: 18.7 ml/min (by C-G formula based on Cr of 4.94). No results found for this basename: VANCOTROUGH, Leodis Binet, VANCORANDOM, GENTTROUGH, GENTPEAK, GENTRANDOM, TOBRATROUGH, TOBRAPEAK, TOBRARND, AMIKACINPEAK, AMIKACINTROU, AMIKACIN,  in the last 72 hours   Microbiology: Recent Results (from the past 720 hour(s))  MRSA PCR SCREENING     Status: None   Collection Time    04/13/13  8:28 PM      Result Value Ref Range Status   MRSA by PCR NEGATIVE  NEGATIVE Final   Comment:            The GeneXpert MRSA Assay (FDA     approved for NASAL specimens     only), is one component of a     comprehensive MRSA colonization     surveillance program. It is not     intended to diagnose MRSA     infection nor to guide or     monitor treatment for     MRSA infections.  CULTURE, BLOOD (ROUTINE X 2)     Status: None   Collection Time    04/14/13  3:45 PM      Result Value Ref Range Status   Specimen Description BLOOD HEMODIALYSIS CATHETER   Final   Special Requests BOTTLES DRAWN AEROBIC AND ANAEROBIC 10CC   Final   Culture  Setup Time     Final   Value: 04/14/2013 18:46     Performed at [facility]   Culture     Final   Value: NO GROWTH 5 DAYS     Performed at  [facility]   Report Status 04/20/2013 FINAL   Final  CULTURE, BLOOD (ROUTINE X 2)     Status: None   Collection Time    04/14/13  3:55 PM      Result Value Ref Range Status   Specimen Description BLOOD HEMODIALYSIS CATHETER   Final   Special Requests BOTTLES DRAWN AEROBIC AND ANAEROBIC 10CC   Final   Culture  Setup Time     Final   Value: 04/14/2013 18:45     Performed at [facility]   Culture     Final   Value: STAPHYLOCOCCUS SPECIES (COAGULASE NEGATIVE)     Note: THE SIGNIFICANCE OF ISOLATING THIS ORGANISM FROM A SINGLE SET OF BLOOD CULTURES WHEN MULTIPLE SETS ARE DRAWN IS UNCERTAIN. PLEASE NOTIFY THE MICROBIOLOGY DEPARTMENT WITHIN ONE WEEK IF SPECIATION AND SENSITIVITIES ARE REQUIRED.     Note: Gram Stain Report Called to,Read Back By and Verified With: MAGGIE BAUGHM 04/15/13 1311 BY SMITHERSJ     Performed at [facility]   Report Status 04/16/2013 FINAL   Final  CULTURE, BLOOD (ROUTINE X 2)  Status: None   Collection Time    04/17/13 10:00 AM      Result Value Ref Range Status   Specimen Description BLOOD HEMODIALYSIS CATHETER   Final   Special Requests BOTTLES DRAWN AEROBIC AND ANAEROBIC 4CC   Final   Culture  Setup Time     Final   Value: 04/17/2013 16:08     Performed at [facility]   Culture     Final   Value:        BLOOD CULTURE RECEIVED NO GROWTH TO DATE CULTURE WILL BE HELD FOR 5 DAYS BEFORE ISSUING A FINAL NEGATIVE REPORT     Performed at [facility]   Report Status PENDING   Incomplete  CULTURE, BLOOD (ROUTINE X 2)     Status: None   Collection Time    04/17/13 11:06 AM      Result Value Ref Range Status   Specimen Description BLOOD LEFT ANTECUBITAL   Final   Special Requests BOTTLES DRAWN AEROBIC ONLY 10CC   Final   Culture  Setup Time     Final   Value: 04/17/2013 16:08     Performed at [facility]   Culture     Final   Value:        BLOOD CULTURE RECEIVED NO GROWTH TO DATE CULTURE WILL BE HELD FOR 5 DAYS  BEFORE ISSUING A FINAL NEGATIVE REPORT     Performed at [facility]   Report Status PENDING   Incomplete  CULTURE, BLOOD (ROUTINE X 2)     Status: None   Collection Time    04/18/13  7:54 AM      Result Value Ref Range Status   Specimen Description BLOOD RIGHT ARM   Final   Special Requests BOTTLES DRAWN AEROBIC ONLY 10CC   Final   Culture  Setup Time     Final   Value: 04/18/2013 17:20     Performed at [facility]   Culture     Final   Value:        BLOOD CULTURE RECEIVED NO GROWTH TO DATE CULTURE WILL BE HELD FOR 5 DAYS BEFORE ISSUING A FINAL NEGATIVE REPORT     Performed at [facility]   Report Status PENDING   Incomplete  CULTURE, BLOOD (ROUTINE X 2)     Status: None   Collection Time    04/18/13  8:45 AM      Result Value Ref Range Status   Specimen Description BLOOD HEMODIALYSIS CATHETER   Final   Special Requests BOTTLES DRAWN AEROBIC AND ANAEROBIC 10CC   Final   Culture  Setup Time     Final   Value: 04/18/2013 17:20     Performed at [facility]   Culture     Final   Value:        BLOOD CULTURE RECEIVED NO GROWTH TO DATE CULTURE WILL BE HELD FOR 5 DAYS BEFORE ISSUING A FINAL NEGATIVE REPORT     Performed at [facility]   Report Status PENDING   Incomplete    Medical History: Past Medical History  Diagnosis Date  . CHF (congestive heart failure)   . CAD (coronary artery disease) 2006  . COPD (chronic obstructive pulmonary disease)   . Hyperlipidemia   . ESRD (end stage renal disease) on dialysis started 08/2006    MTTS [place]; "getting ready to have it on TTS" (05/07/2012)  . Type II diabetes mellitus  etiology of ESRD  . Diabetic blindness   . Hypertension   . MI, old 2006  . Asthma   . Pneumonia     "everytime I take a cold" (05/07/2012)  . Bronchiectasis   . Shortness of breath     "can happen at anytime" (05/07/2012)  . GERD (gastroesophageal reflux disease)   . Stroke 2006    "numb on left side since"   05/07/2012  . On home oxygen therapy     "3L at night mostly; also prn" (05/07/2012)  . Cardiomegaly      Assessment: [age] YOM presented on 04/13/13 with acute on chronic respiratory failure and encephalopathy.  Pharmacy consulted to start Levaquin for HCAP.  Patient has ESRD on HD on TTS.  4/14 BCx - CoNS (1 of 2) 4/18 BCx - NGTD   Goal of Therapy:  Clearance of infection   Plan:  - Levaquin 750mg  PO x 1, then 500mg  PO Q48H - Monitor clinical progress - Pharmacy will sign off as dosage adjustment unnecessary for ESRD patient.  Thank you for the consult. - F/U hgb peripherally and recommend to hold/decrease Aranesp if hgb is sustainable (>/= 11 g/dL)    Breyanna Valera D. Laney Potashang, PharmD, BCPS Pager:  [phone] 04/20/2013, 4:28 PM

## 2013-04-20 NOTE — Progress Notes (Signed)
Utilization review completed.  

## 2013-04-20 NOTE — Progress Notes (Signed)
Subjective: Wants something for cough, "when can I go home?"   Filed Vitals:   04/20/13 0001 04/20/13 0358 04/20/13 0747 04/20/13 0758  BP: 141/84 128/61 159/66   Pulse: 79 78 86   Temp: 98.5 F (36.9 C) 98.4 F (36.9 C)  98.6 F (37 C)  TempSrc: Oral Oral  Oral  Resp: 10 19 15    Height:      Weight:  108.6 kg (239 lb 6.7 oz)    SpO2: 96% 98% 92% 98%   Exam: Elderly obese WM, lying on his side, coughing, raspy cough No JVD Chest rales L base, decreased througout RRR no MRG Abd obese, soft, NTND Ext no edema Neuro Ox3, no asterixis, nonfocal, gen weakness L IJ Diatek cath  Dialysis: TTS [place] 4h 15min   112.5kg    2/2.0 [place]  P4     Heparin 8000   L IJ catheter  Epo 1000     Venofer 50/wk  Assessment: 1 Acute on chronic resp failure w AMS- narcotics, CO2 retention, improved 2 COPD / OHS / OSA- on home O2 3 1/6 BC + for coag neg staph- likely contaminant 4 ESRD  5 DM2 6 Anemia w EPO on hold 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, binders 8 Bradycardia- improved, MTP stopped 9 Chronic L hip pain / hx L THA 10 Debility- essentially WC bound 11 Cough- persistent w abnl CXR on 4/17 12 Volume- chronic low BP's on midodrine 10 tid, 4kg under dry wt but BP up  Plan- HD tomorrow, decrease midodrine dose, UF 3kg as tolerated; also consider abx for possible PNA, have d/w primary     Vinson Moselleob Jniyah Dantuono MD  pager [phone]    cell [phone]  04/20/2013, 10:41 AM     Recent Labs Lab 04/15/13 0245 04/16/13 0234  04/17/13 0637 04/18/13 0400 04/18/13 1632  NA 139 135*  < > 135* 137 134*  K 4.3 4.8  < > 4.0 4.4 3.6*  CL 94* 90*  < > 93* 94* 94*  CO2 24 25  < > 26 25 23   GLUCOSE 75 151*  < > 82 109* 82  BUN 30* 50*  < > 27* 39* 20  CREATININE 6.91* 8.52*  < > 5.88* 7.71* 4.94*  CALCIUM 9.8 9.9  < > 10.3 10.7* 10.3  PHOS 7.0* 9.1*  --   --  7.9*  --   < > = values in this interval not displayed.  Recent Labs Lab 04/13/13 1155 04/14/13 0324  04/16/13 0234 04/16/13 1852 04/18/13 0400   AST 9 7  --   --  7  --   ALT 11 10  --   --  9  --   ALKPHOS 61 54  --   --  53  --   BILITOT 0.4 0.4  --   --  0.4  --   PROT 7.2 6.5  --   --  6.8  --   ALBUMIN 3.6 3.2*  < > 2.9* 2.8* 3.0*  < > = values in this interval not displayed.  Recent Labs Lab 04/13/13 1155 04/13/13 1207 04/16/13 0230 04/18/13 0400  WBC 7.9  --  14.0* 9.8  NEUTROABS 4.9  --   --   --   HGB 11.9* 12.9* 10.3* 10.2*  HCT 36.4* 38.0* 32.7* 31.5*  MCV 97.1  --  98.2 96.3  PLT 210  --  234 246   . artificial tears  1 application Right Eye BID  . aspirin  325 mg  Oral Daily  . brimonidine  1 drop Both Eyes BID  . cinacalcet  60 mg Oral BID WC  . clopidogrel  75 mg Oral Q breakfast  . [START ON 04/21/2013] darbepoetin (ARANESP) injection - DIALYSIS  100 mcg Intravenous Q Tue-HD  . fluticasone  1 puff Inhalation BID  . heparin  5,000 Units Subcutaneous 3 times per day  . insulin aspart  0-9 Units Subcutaneous TID WC  . midodrine  10 mg Oral TID WC  . multivitamin  1 tablet Oral QHS  . pantoprazole  40 mg Oral Daily  . rOPINIRole  0.5 mg Oral QHS  . sevelamer carbonate  1,600 mg Oral TID WC  . simvastatin  20 mg Oral q1800  . cyanocobalamin  1,000 mcg Oral Daily     acetaminophen, acetaminophen, albuterol, ondansetron (ZOFRAN) IV, ondansetron, polyvinyl alcohol

## 2013-04-21 DIAGNOSIS — I5030 Unspecified diastolic (congestive) heart failure: Secondary | ICD-10-CM | POA: Diagnosis present

## 2013-04-21 DIAGNOSIS — I509 Heart failure, unspecified: Secondary | ICD-10-CM

## 2013-04-21 DIAGNOSIS — R404 Transient alteration of awareness: Secondary | ICD-10-CM

## 2013-04-21 DIAGNOSIS — D649 Anemia, unspecified: Secondary | ICD-10-CM

## 2013-04-21 DIAGNOSIS — R55 Syncope and collapse: Secondary | ICD-10-CM

## 2013-04-21 LAB — CBC
HCT: 27.8 % — ABNORMAL LOW (ref 39.0–52.0)
Hemoglobin: 9.4 g/dL — ABNORMAL LOW (ref 13.0–17.0)
MCH: 31.5 pg (ref 26.0–34.0)
MCHC: 33.8 g/dL (ref 30.0–36.0)
MCV: 93.3 fL (ref 78.0–100.0)
Platelets: 260 10*3/uL (ref 150–400)
RBC: 2.98 MIL/uL — ABNORMAL LOW (ref 4.22–5.81)
RDW: 12.1 % (ref 11.5–15.5)
WBC: 9.3 10*3/uL (ref 4.0–10.5)

## 2013-04-21 LAB — RENAL FUNCTION PANEL
Albumin: 2.8 g/dL — ABNORMAL LOW (ref 3.5–5.2)
BUN: 45 mg/dL — ABNORMAL HIGH (ref 6–23)
CO2: 23 meq/L (ref 19–32)
CREATININE: 9.33 mg/dL — AB (ref 0.50–1.35)
Calcium: 9.4 mg/dL (ref 8.4–10.5)
Chloride: 93 mEq/L — ABNORMAL LOW (ref 96–112)
GFR calc non Af Amer: 5 mL/min — ABNORMAL LOW (ref >90)
GFR, EST AFRICAN AMERICAN: 6 mL/min — AB (ref >90)
Glucose, Bld: 152 mg/dL — ABNORMAL HIGH (ref 70–99)
Phosphorus: 6.4 mg/dL — ABNORMAL HIGH (ref 2.3–4.6)
Potassium: 4 mEq/L (ref 3.7–5.3)
SODIUM: 137 meq/L (ref 137–147)

## 2013-04-21 LAB — GLUCOSE, CAPILLARY
Glucose-Capillary: 99 mg/dL (ref 70–99)
Glucose-Capillary: 194 mg/dL — ABNORMAL HIGH (ref 70–99)

## 2013-04-21 MED ORDER — DARBEPOETIN ALFA-POLYSORBATE 100 MCG/0.5ML IJ SOLN
INTRAMUSCULAR | Status: AC
  Filled 2013-04-21: qty 0.5

## 2013-04-21 MED ORDER — MIDODRINE HCL 5 MG PO TABS
5.0000 mg | ORAL_TABLET | Freq: Three times a day (TID) | ORAL | Status: DC
  Administered 2013-04-21 – 2013-04-22 (×2): 5 mg via ORAL
  Filled 2013-04-21 (×5): qty 1

## 2013-04-21 MED ORDER — ACETAMINOPHEN 325 MG PO TABS
ORAL_TABLET | ORAL | Status: AC
  Administered 2013-04-21: 650 mg via ORAL
  Filled 2013-04-21: qty 2

## 2013-04-21 MED ORDER — GUAIFENESIN 100 MG/5ML PO SOLN
5.0000 mL | ORAL | Status: DC | PRN
  Filled 2013-04-21 (×2): qty 5

## 2013-04-21 NOTE — Progress Notes (Signed)
Occupational Therapy Treatment Patient Details Name: [name] MRN: [id number] DOB: 12/11/1945 Today's Date: 04/21/2013    History of present illness 68 year old WM PMHx multiple medical problems including fracture of left hip 2 weeks ago unrepaired per wife [facility] without weight or movement restrictions(same hip S/P THA 1998), chronic kidney disease on dialysis T/Th/Sat, chronic hypoxemic respiratory failure due to COPD on 3 L nasal cannula oxygen at home, sleep apnea noncompliant with CPAP and diabetes. He also has orthostatic hypotension on dialysis days and is on chronic midodrine.   OT comments  Pt very distracted this session and was fixated on making calls on his cell phone an about a physician that he states that he does not like. Pt continuess to require extensive assist with bed mobility and only sat EOB x 2 minutes with max - mod A for  Balance/saefty during grooming/hyiene tasks. Pt returned to supine after stating that he did not want to sit up to eat. Pt only consumed 2 spoonfuls of food at bed level with HOB elevated before stating that he did not want anymore food right now and continued to be fixated on making calls on his cell phone and physician. Unable to locate utensil tubing form previous session. Will bring next session  Follow Up Recommendations  SNF;Supervision/Assistance - 24 hour    Equipment Recommendations  None recommended by OT    Recommendations for Other Services      Precautions / Restrictions Precautions Precautions: Fall Precaution Comments: legally blind Restrictions Weight Bearing Restrictions: No       Mobility Bed Mobility         Supine to sit: Max assist Sit to supine: HOB elevated   General bed mobility comments: Pt sat EOB x 2 minutes before stating that he did not want to eat right now. Pt ate minimal amount of food at bed level. Required repostitoning to midline and [facility] before able to eat safely. HOB elevtaed  after positioning  Transfers                      Balance  sitting balance Poor                                 ADL   Eating/Feeding: Minimal assistance (pt very distracted and only ate 2 spoonfuls of food at bed level)   Grooming: Wash/dry face;Sitting;Minimal assistance                                 General ADL Comments: Pt very distracted about calling someone on the phone and about physician that he does not like. Pt only fed self 2 spoonfuls of food  at bed level before staing that he didn't want anymore right now. Unable to locate utensil tubing from last session                                      Cognition   Behavior During Therapy: Restless;Anxious   Area of Impairment: Attention;Following commands        Following Commands: Follows one step commands inconsistently  General Comments  Pt anxious, agitated and distracted    Pertinent Vitals/ Pain       No c/o pain, VSS                                                          Frequency Min 2X/week     Progress Toward Goals  OT Goals(current goals can now be found in the care plan section)  Progress towards OT goals: OT to reassess next treatment     Plan Discharge plan remains appropriate                     End of Session     Activity Tolerance  (pt distracted/fixated on cell phone call)   Patient Left in bed;with call bell/phone within reach             Time: 1354-1409 OT Time Calculation (min): 15 min  Charges:    Lafe Garin 04/21/2013, 2:44 PM

## 2013-04-21 NOTE — Procedures (Signed)
I was present at this dialysis session, have reviewed the session itself and made  appropriate changes  Rob Azalie Harbeck MD (pgr) [phone]    (c) [phone] 04/21/2013, 12:43 PM   

## 2013-04-21 NOTE — Progress Notes (Signed)
[facility] Progress Note  Subjective:  Mentation slow. Angry about being asked MMS questions. Explained the purpose.  Objective Filed Vitals:   04/21/13 1030 04/21/13 1100 04/21/13 1115 04/21/13 1300  BP: 117/64 132/96 149/77 148/49  Pulse: 85 86 69 74  Temp:   97.5 F (36.4 C) 97.1 F (36.2 C)  TempSrc:   Oral Oral  Resp: 18  18 19   Height:      Weight:   106.6 kg (235 lb 0.2 oz)   SpO2:   94% 94%   Physical Exam General: Chronically ill-appearing, mild lethargy, NAD Heart: RRR, no murmur or rub appreciated Lungs: Diminished, no appreciable wheezes or rales Abdomen: Obese, NT, + BS Extremities: No LE edema Dialysis Access: Lt  I-J TDC  Dialysis: TTS [facility]  4h 112.5kg  2/2.0 Bath P4 Heparin 8000 L IJ catheter  Epo 1000 Venofer 50/wk  Assessment/Plan: 1. Acute on chronic resp failure w AMS  - Improving. Sedating meds on hold. Mgmt per primary 2. COPD / OHS / OSA - nonadherent with home 02 and CPAP. Mgmt per primary 3. ESRD - TTS, K+ 4, Next HD Thursday 4. Hypotension/Volume - SBPs 130s-140s. Net UF today 3L. Now under edw by ~6kg. Currently on Midodrine 10 mg TID. Will decrease to 5 mg TID and monitor. Lower edw at discharge. 5. Anemia - Hgb 9.4, Aranesp 100 q Tues. Weekly IV Fe. 6. 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 P 6.4. Not on Vit D. Continue low Ca bath, Sensipar and Renvela  7. Bradycardia - improved, MTP stopped, pt refuses tele 8. Coag neg staph bacteremia - likely contaminant  9. Chronic L hip pain / hx L THA  10. Debility- WC bound 11. DM2 12. Dispo - To SN[facility]. [facility].  [name]. [name], PA-C [facility] Pager [phone] 04/21/2013,4:30 PM  LOS: 8 days   Pt seen, examined and agree w A/P as above.  Todd Moselle MD pager [phone]    cell [phone] 04/21/2013, 4:51 PM    Additional Objective Labs: Basic Metabolic Panel:  Recent Labs Lab 04/16/13 0234   04/18/13 0400 04/18/13 1632 04/21/13 0735  NA 135*  < > 137 134* 137  K 4.8  < > 4.4 3.6* 4.0  CL 90*  < > 94* 94* 93*  CO2 25  < > 25 23 23   GLUCOSE 151*  < > 109* 82 152*  BUN 50*  < > 39* 20 45*  CREATININE 8.52*  < > 7.71* 4.94* 9.33*  CALCIUM 9.9  < > 10.7* 10.3 9.4  PHOS 9.1*  --  7.9*  --  6.4*  < > = values in this interval not displayed. Liver Function Tests:  Recent Labs Lab 04/16/13 1852 04/18/13 0400 04/21/13 0735  AST 7  --   --   ALT 9  --   --   ALKPHOS 53  --   --   BILITOT 0.4  --   --   PROT 6.8  --   --   ALBUMIN 2.8* 3.0* 2.8*   CBC:  Recent Labs Lab 04/16/13 0230 04/18/13 0400 04/21/13 0735  WBC 14.0* 9.8 9.3  HGB 10.3* 10.2* 9.4*  HCT 32.7* 31.5* 27.8*  MCV 98.2 96.3 93.3  PLT 234 246 260   Blood Culture    Component Value Date/Time   SDES BLOOD HEMODIALYSIS CATHETER 04/18/2013 0845   SPECREQUEST BOTTLES DRAWN AEROBIC AND ANAEROBIC 10CC 04/18/2013 0845  CULT  Value:        BLOOD CULTURE RECEIVED NO GROWTH TO DATE CULTURE WILL BE HELD FOR 5 DAYS BEFORE ISSUING A FINAL NEGATIVE REPORT Performed at [facility] 04/18/2013 0845   REPTSTATUS PENDING 04/18/2013 0845    Cardiac Enzymes:  Recent Labs Lab 04/15/13 2347 04/16/13 1852 04/17/13 0637  TROPONINI <0.30 <0.30 <0.30   CBG:  Recent Labs Lab 04/19/13 2359 04/20/13 0744 04/20/13 1132 04/20/13 2108 04/21/13 1210  GLUCAP 165* 120* 125* 120* 99    Studies/Results: Dg Chest 2 View  04/21/2013   CLINICAL DATA:  Evaluate infiltrate in the left base.  EXAM: CHEST  2 VIEW  COMPARISON:  DG CHEST 1V PORT dated 04/17/2013  FINDINGS: Shallow inspiration. Left costophrenic angle is not included within the field of view. Heart size is increased. Pulmonary vascularity is mildly prominent. No focal airspace disease demonstrated in the visualized lungs. Prominent soft tissue densities in the mediastinum are similar to previous study. This is likely due to prominent mediastinal fat. Left  central venous catheter is unchanged in position.  IMPRESSION: Technically limited study. Cardiac enlargement with mild vascular congestion. No focal consolidation appreciated.   Electronically Signed   By: Burman NievesWilliam  Stevens M.D.   On: 04/21/2013 01:03   Medications:   . artificial tears  1 application Right Eye BID  . aspirin  325 mg Oral Daily  . brimonidine  1 drop Both Eyes BID  . cinacalcet  60 mg Oral BID WC  . clopidogrel  75 mg Oral Q breakfast  . darbepoetin (ARANESP) injection - DIALYSIS  100 mcg Intravenous Q Tue-HD  . fluticasone  1 puff Inhalation BID  . heparin  5,000 Units Subcutaneous 3 times per day  . insulin aspart  0-9 Units Subcutaneous TID WC  . midodrine  5 mg Oral TID WC  . multivitamin  1 tablet Oral QHS  . pantoprazole  40 mg Oral Daily  . rOPINIRole  0.5 mg Oral QHS  . sevelamer carbonate  1,600 mg Oral TID WC  . simvastatin  20 mg Oral q1800  . cyanocobalamin  1,000 mcg Oral Daily

## 2013-04-21 NOTE — Progress Notes (Addendum)
TEAM 1 - Stepdown/ICU TEAM Progress Note  [name] WUJ:[id number] DOB: October 09, 1945 DOA: 04/13/2013 PCP: [name], MD  Admit HPI / Brief Narrative: 68 year old M PMHx multiple medical problems including "fracture" of left hip 2 weeks ago unrepaired per wife (same hip S/P THA 1998), chronic kidney disease on dialysis T/Th/Sat, chronic hypoxemic respiratory failure due to COPD on 3 L nasal cannula oxygen at home, sleep apnea noncompliant with CPAP, and diabetes. He also has orthostatic hypotension on dialysis days and is on chronic midodrine.  Patient apparently had been experiencing multiple falls and was having difficulty getting up despite assistance from his wife. On the date of admission he was more lethargic with respiratory difficulties and increasing hypoxemia despite his usual oxygen. Recent started by the [facility] on OxyIR and wife was concerned patient may have taken more than was prescribed.  Upon arrival to the ER patient had mild acidosis with hypercapnia and hypoxemia. Patient condition drastically improved with administration of Narcan and use of BiPAP.    HPI/Subjective: A./O. x4, patient answers questions appropriately follows commands. Negative CP/SOB   Assessment/Plan: Encephalopathy - acute  -Ultram dc'd 4/16 - stopped Neurontin  -Suspect altered mental status due to noncompliance with CPAP + sedating meds + episodic bradycardia - continue BiPAP prn when pt will allow, though he refuses virtually any time he is alert enough to do so  -4/21 counseled patient that his continued noncompliance with CPAP/BiPAP use, and overuse of narcotics will eventually result in significant increase mortality and morbidity. -CSW working on placement for patient should be discharged within 24-48 hours  ?LLL infiltrate - ?Pneumonia  -afebrile - no WBC elevation - nonetheless, the pt does c/o cough, and CXR from 4/17 does in fact suggest possible LLL infiltrate v/s atx  -  4/21 CXR negative for pneumonia will DC antibiotic    Symptomatic bradycardia / [name]  -bradycardia was associated with hypotension and mental status changes  -Cardiology consulted - rec Dopamine but hr/BP spontaneously recovered and Dopamine was not required  -suspect this was likely contributing to frequent falls at home  -dc'd Lopressor - pt now frequently refusing to wear tele therefore further eval has been impaired -4/21 patient stable for discharge, currently a placement issue   Acute and chronic respiratory failure:  A) Baseline COPD (chronic obstructive pulmonary disease)  B) OSA/Obesity hypoventilation syndrome  -responds to BIPAP during episodes of severe lethargy, but then refuses to wear once becomes more alert  -not likley much that can be done to help him long term as long as he continues to be noncompliant  -pt non adherent with CPAP at home  -currently refusing to wear even his oxygen  -4/21 counseled patient on his increased morbidity/mortality if he continues to refuse to use CPAP/BiPAP at night  Coag neg staph Bacteremia  -now only 1/6 bottles positive thus far - likely contaminant  -since HD pt does have risk factors for true bacteremia - follow out to 5 full days of surveillance  -4/21 most likely a contaminant since patient has had multiple blood cultures drawn which have been negative   End stage renal disease on dialysis  -as per Nephro  -T-Th-Sa usual HD days.  -does not appear volume overloaded at this time - for HD in AM  - Opioid overdose / Chronic pain  -unintentional and due to ongoing left hip pain (see below)  -no narcs for now due to sedation issues  - would advise against prescribing narcotics at d/c  due to high risk for resp arrest   Reported Broken left hip prosthesis  -THR 1997 in [place]  -wife states pt falling- went to [place] and had X[facility] hip - [facility] and [facility] at area of [facility] and ?plan to FU for  eventual surgical evaluation  -left hip XR here without fracture but was portable  - CT hip: Left total hip arthroplasty without hardware failure or complication.  -per family pt had limited mobility post hip repair in 1997 progressive worsening of mobility - by 2006 essentially WC bound   DM2  -CBGs controlled   Obesity  - Body mass index is 32.46 kg/(m^2).   CAD  -Continue Plavix  DIASTOLIC CHF/HTN  -issues with orthostasis on HD days could be contributing to falls  -Nephro has dc'd hydralazine  -Echo done last May 2014 with preserved LV function,, with left atrium moderate -severe dilation -Patient currently not on beta blocker, Ace, nitrate secondary to his previous episode of symptomatic bradycardia.continue to hold all of these medications  GERD    DVT prophylaxis: Subcutaneous heparin  Code Status: Full  Family Communication: no family present at time of exam today  Disposition Plan/Expected LOS: PT recommends SNF - pt needs higher level of care but will clearly refuse same and will not thrive at home where he will simply not comply w/ tx - transfer to medical bed - if pt unwilling to go to SNF, will tentatively plan to d/c home 4/21 after HD as pt is not benefiting from being in the hospital in that he is refusing all care (to include even vitals)  Code Status: FULL  Family Communication: no family present at time of exam  Disposition Plan:     Consultants: Dr. [name] (nephrology)   Procedure/Significant Events:  CXR/21/2015 Technically limited study. Cardiac enlargement with mild vascular  congestion. No focal consolidation appreciated  Echocardiogram 04/16/2013 - Left ventricle: Mild Concentric hypertrophy.  -LVEF= 55%- 60%.  - Aortic valve: There was mild to moderate stenosis.  - Mitral valve: Mildly calcified annulus. Mildly calcified leaflets  - Left atrium: The atrium was moderately to severely dilated.   Culture 4/18 Blood culture right  arm/hemodialysis catheter NTD 4/17 blood culture left antecubital/hemodialysis catheter NTD 4/14 hemodialysis catheter 1/2 positive for Staphylococcus coag negative (most likely contaminant)   Antibiotics: Levofloxacin 4/20>> stopped 4/21  DVT prophylaxis: Subcutaneous heparin   Devices    LINES / TUBES:      Continuous Infusions:   Objective: VITAL SIGNS: Temp: 97.1 F (36.2 C) (04/21 1300) Temp src: Oral (04/21 1300) BP: 148/49 mmHg (04/21 1300) Pulse Rate: 74 (04/21 1300) SpO2; 94% on room air FIO2:   Intake/Output Summary (Last 24 hours) at 04/21/13 1631 Last data filed at 04/21/13 1300  Gross per 24 hour  Intake    236 ml  Output   3000 ml  Net  -2764 ml     Exam: General: A./O. x4, NAD  Lungs: Clear to auscultation bilaterally without wheezes or crackles Cardiovascular: Regular rate and rhythm without murmur gallop or rub normal S1 and S2 Renalbalance today;  -[age] male      /overall;        Creatinine ;        Hourly output   Abdomen: Nontender, nondistended, soft, bowel sounds positive, no rebound, no ascites, no appreciable mass Extremities: No significant cyanosis, clubbing, or edema bilateral lower extremities  Data Reviewed: Basic Metabolic Panel:  Recent Labs Lab  04/15/13 0245 04/16/13 0234 04/16/13 1852 04/17/13 0637 04/18/13 0400 04/18/13 1632 04/21/13 0735  NA 139 135* 135* 135* 137 134* 137  K 4.3 4.8 3.8 4.0 4.4 3.6* 4.0  CL 94* 90* 94* 93* 94* 94* 93*  CO2 24 25 25 26 25 23 23   GLUCOSE 75 151* 98 82 109* 82 152*  BUN 30* 50* 22 27* 39* 20 45*  CREATININE 6.91* 8.52* 4.79* 5.88* 7.71* 4.94* 9.33*  CALCIUM 9.8 9.9 9.6 10.3 10.7* 10.3 9.4  MG  --   --  1.7  --   --   --   --   PHOS 7.0* 9.1*  --   --  7.9*  --  6.4*   Liver Function Tests:  Recent Labs Lab 04/15/13 0245 04/16/13 0234 04/16/13 1852 04/18/13 0400 04/21/13 0735  AST  --   --  7  --   --   ALT  --   --  9  --   --   ALKPHOS  --   --  53  --   --    BILITOT  --   --  0.4  --   --   PROT  --   --  6.8  --   --   ALBUMIN 2.9* 2.9* 2.8* 3.0* 2.8*   No results found for this basename: LIPASE, AMYLASE,  in the last 168 hours  Recent Labs Lab 04/18/13 0400  AMMONIA 22   CBC:  Recent Labs Lab 04/16/13 0230 04/18/13 0400 04/21/13 0735  WBC 14.0* 9.8 9.3  HGB 10.3* 10.2* 9.4*  HCT 32.7* 31.5* 27.8*  MCV 98.2 96.3 93.3  PLT 234 246 260   Cardiac Enzymes:  Recent Labs Lab 04/15/13 2347 04/16/13 1852 04/17/13 0637  TROPONINI <0.30 <0.30 <0.30   BNP (last 3 results) No results found for this basename: PROBNP,  in the last 8760 hours CBG:  Recent Labs Lab 04/19/13 2359 04/20/13 0744 04/20/13 1132 04/20/13 2108 04/21/13 1210  GLUCAP 165* 120* 125* 120* 99    Recent Results (from the past 240 hour(s))  MRSA PCR SCREENING     Status: None   Collection Time    04/13/13  8:28 PM      Result Value Ref Range Status   MRSA by PCR NEGATIVE  NEGATIVE Final   Comment:            The GeneXpert MRSA Assay (FDA     approved for NASAL specimens     only), is one component of a     comprehensive MRSA colonization     surveillance program. It is not     intended to diagnose MRSA     infection nor to guide or     monitor treatment for     MRSA infections.  CULTURE, BLOOD (ROUTINE X 2)     Status: None   Collection Time    04/14/13  3:45 PM      Result Value Ref Range Status   Specimen Description BLOOD HEMODIALYSIS CATHETER   Final   Special Requests BOTTLES DRAWN AEROBIC AND ANAEROBIC 10CC   Final   Culture  Setup Time     Final   Value: 04/14/2013 18:46     Performed at [facility]   Culture     Final   Value: NO GROWTH 5 DAYS     Performed at [facility]   Report Status 04/20/2013 FINAL   Final  CULTURE, BLOOD (ROUTINE X 2)  Status: None   Collection Time    04/14/13  3:55 PM      Result Value Ref Range Status   Specimen Description BLOOD HEMODIALYSIS CATHETER   Final   Special Requests  BOTTLES DRAWN AEROBIC AND ANAEROBIC 10CC   Final   Culture  Setup Time     Final   Value: 04/14/2013 18:45     Performed at [facility]   Culture     Final   Value: STAPHYLOCOCCUS SPECIES (COAGULASE NEGATIVE)     Note: THE SIGNIFICANCE OF ISOLATING THIS ORGANISM FROM A SINGLE SET OF BLOOD CULTURES WHEN MULTIPLE SETS ARE DRAWN IS UNCERTAIN. PLEASE NOTIFY THE MICROBIOLOGY DEPARTMENT WITHIN ONE WEEK IF SPECIATION AND SENSITIVITIES ARE REQUIRED.     Note: Gram Stain Report Called to,Read Back By and Verified With: MAGGIE BAUGHM 04/15/13 1311 BY SMITHERSJ     Performed at [facility]   Report Status 04/16/2013 FINAL   Final  CULTURE, BLOOD (ROUTINE X 2)     Status: None   Collection Time    04/17/13 10:00 AM      Result Value Ref Range Status   Specimen Description BLOOD HEMODIALYSIS CATHETER   Final   Special Requests BOTTLES DRAWN AEROBIC AND ANAEROBIC 4CC   Final   Culture  Setup Time     Final   Value: 04/17/2013 16:08     Performed at [facility]   Culture     Final   Value:        BLOOD CULTURE RECEIVED NO GROWTH TO DATE CULTURE WILL BE HELD FOR 5 DAYS BEFORE ISSUING A FINAL NEGATIVE REPORT     Performed at [facility]   Report Status PENDING   Incomplete  CULTURE, BLOOD (ROUTINE X 2)     Status: None   Collection Time    04/17/13 11:06 AM      Result Value Ref Range Status   Specimen Description BLOOD LEFT ANTECUBITAL   Final   Special Requests BOTTLES DRAWN AEROBIC ONLY 10CC   Final   Culture  Setup Time     Final   Value: 04/17/2013 16:08     Performed at [facility]   Culture     Final   Value:        BLOOD CULTURE RECEIVED NO GROWTH TO DATE CULTURE WILL BE HELD FOR 5 DAYS BEFORE ISSUING A FINAL NEGATIVE REPORT     Performed at [facility]   Report Status PENDING   Incomplete  CULTURE, BLOOD (ROUTINE X 2)     Status: None   Collection Time    04/18/13  7:54 AM      Result Value Ref Range Status   Specimen Description  BLOOD RIGHT ARM   Final   Special Requests BOTTLES DRAWN AEROBIC ONLY 10CC   Final   Culture  Setup Time     Final   Value: 04/18/2013 17:20     Performed at [facility]   Culture     Final   Value:        BLOOD CULTURE RECEIVED NO GROWTH TO DATE CULTURE WILL BE HELD FOR 5 DAYS BEFORE ISSUING A FINAL NEGATIVE REPORT     Performed at [facility]   Report Status PENDING   Incomplete  CULTURE, BLOOD (ROUTINE X 2)     Status: None   Collection Time    04/18/13  8:45 AM      Result Value  Ref Range Status   Specimen Description BLOOD HEMODIALYSIS CATHETER   Final   Special Requests BOTTLES DRAWN AEROBIC AND ANAEROBIC 10CC   Final   Culture  Setup Time     Final   Value: 04/18/2013 17:20     Performed at [facility]   Culture     Final   Value:        BLOOD CULTURE RECEIVED NO GROWTH TO DATE CULTURE WILL BE HELD FOR 5 DAYS BEFORE ISSUING A FINAL NEGATIVE REPORT     Performed at [facility]   Report Status PENDING   Incomplete     Studies:  Recent x-ray studies have been reviewed in detail by the Attending Physician  Scheduled Meds:  Scheduled Meds: . artificial tears  1 application Right Eye BID  . aspirin  325 mg Oral Daily  . brimonidine  1 drop Both Eyes BID  . cinacalcet  60 mg Oral BID WC  . clopidogrel  75 mg Oral Q breakfast  . darbepoetin (ARANESP) injection - DIALYSIS  100 mcg Intravenous Q Tue-HD  . fluticasone  1 puff Inhalation BID  . heparin  5,000 Units Subcutaneous 3 times per day  . insulin aspart  0-9 Units Subcutaneous TID WC  . midodrine  5 mg Oral TID WC  . multivitamin  1 tablet Oral QHS  . pantoprazole  40 mg Oral Daily  . rOPINIRole  0.5 mg Oral QHS  . sevelamer carbonate  1,600 mg Oral TID WC  . simvastatin  20 mg Oral q1800  . cyanocobalamin  1,000 mcg Oral Daily    Time spent on care of this patient: 40 mins   Drema Dallas , MD   [facility] Office  [phone] Pager [phone]  On-Call/Text  Page:      [email or web address]      password [email or web address]  If 7PM-7AM, please contact night-coverage [email or web address] Password [email or web address] 04/21/2013, 4:31 PM   LOS: 8 days

## 2013-04-21 NOTE — Progress Notes (Signed)
OT Cancellation Note  Patient Details Name: AAVION JELKS MRN: [id number] DOB: [birth year]   Cancelled Treatment:    Reason Eval/Treat Not Completed: Patient at procedure or test/ unavailable. Pt at hemodialysis, will re attempt later today as time allows/as appropriate  Lafe Garin 04/21/2013, 9:48 AM

## 2013-04-21 NOTE — Clinical Social Work Note (Cosign Needed)
CSW intern introduced self to patient and family. Patient seemed a bit confused. CSW discussed bed offers with wife, Jamesetta So. Jamesetta So advised CSW intern that she would like [facility] and [facility]. CSW intern also advised Jamesetta So that the facilities she chose she would need transportation for the patient to hemodialysis. Jamesetta So advised CSW intern that the [place] will transport him to dialysis.  CSW intern contacted [facility] and left a messaged for the admissions director. [facility] admissions director return CSW intern phone call and will give bed offer tomorrow 04/22/13 morning. CSW will follow up with facility response.   Deniece Ree, CSW Intern.

## 2013-04-21 NOTE — Progress Notes (Signed)
Chart review complete.  Patient is not eligible for [facility] Care Management services because his/her PCP is not a [facility] primary care provider or is not [facility] affiliated.  For any additional questions or new referrals please contact Tim Henderson BSN RN [facility] Liaison at [phone] °

## 2013-04-21 NOTE — Progress Notes (Signed)
Received to room 6e15,alert and oriented to person and place. Reoriented to  Time and situation with call bell placed and bed alarm set. Instructions given on use of call bell  And reason bed alarm settings.

## 2013-04-21 NOTE — Consult Note (Signed)
[facility] Face-to-Face Psychiatry Consult   Reason for Consult:  Capacity Referring Physician:  Dr Wendie Agreste is an 68 y.o. male. Total Time spent with patient: 20 minutes  Assessment: AXIS I:  Delirium AXIS II:  Deferred AXIS III:   Past Medical History  Diagnosis Date  . CHF (congestive heart failure)   . CAD (coronary artery disease) 2006  . COPD (chronic obstructive pulmonary disease)   . Hyperlipidemia   . ESRD (end stage renal disease) on dialysis started 08/2006    MTTS [place]; "getting ready to have it on TTS" (05/07/2012)  . Type II diabetes mellitus     etiology of ESRD  . Diabetic blindness   . Hypertension   . MI, old 2006  . Asthma   . Pneumonia     "everytime I take a cold" (05/07/2012)  . Bronchiectasis   . Shortness of breath     "can happen at anytime" (05/07/2012)  . GERD (gastroesophageal reflux disease)   . Stroke 2006    "numb on left side since"  05/07/2012  . On home oxygen therapy     "3L at night mostly; also prn" (05/07/2012)  . Cardiomegaly    AXIS IV:  other psychosocial or environmental problems and problems related to social environment AXIS V:  31-40 impairment in reality testing  Plan:  No evidence of imminent risk to self or others at present.   Patient does not meet criteria for psychiatric inpatient admission. Supportive therapy provided about ongoing stressors. Discussed crisis plan, support from social network, calling 911, coming to the Emergency Department, and calling Suicide Hotline.  Subjective:   [name] is a 68 y.o. male patient admitted with respiratory failure, encephalopathy and multiple health issues.  HPI:  Patient seen chart reviewed.  The patient has multiple medical problems including fracture of left hip 2 weeks ago, chronic kidney disease and COPD.  He has multiple falls .  Consult was called for capacity.  The patient appears very confused disoriented and difficult to engage in conversation.  He knows that  he is in the hospital but unable to provide any detail.  He appears disoriented, talking to himself and rambling thought process and his speech.  He was incoherent and unable to process any thinking.  Patient is a poor historian and he has memory impairment confusion.  He was capable in his left hand and it appears that he is trying to pick something.  Patient did not answer about memory questions, orientation, hallucinations, suicidal thoughts or homicidal thoughts.  At this time patient is unable to participate in his treatment plan and he does not have capacity.   Past Psychiatric History: Past Medical History  Diagnosis Date  . CHF (congestive heart failure)   . CAD (coronary artery disease) 2006  . COPD (chronic obstructive pulmonary disease)   . Hyperlipidemia   . ESRD (end stage renal disease) on dialysis started 08/2006    MTTS [place]; "getting ready to have it on TTS" (05/07/2012)  . Type II diabetes mellitus     etiology of ESRD  . Diabetic blindness   . Hypertension   . MI, old 2006  . Asthma   . Pneumonia     "everytime I take a cold" (05/07/2012)  . Bronchiectasis   . Shortness of breath     "can happen at anytime" (05/07/2012)  . GERD (gastroesophageal reflux disease)   . Stroke 2006    "numb on left side since"  05/07/2012  . On home oxygen therapy     "3L at night mostly; also prn" (05/07/2012)  . Cardiomegaly     reports that he quit smoking about 29 years ago. His smoking use included Cigarettes. He has a 60 pack-year smoking history. He has never used smokeless tobacco. He reports that he drinks alcohol. He reports that he does not use illicit drugs. Family History  Problem Relation Age of Onset  . Diabetes Mellitus II Father      Living Arrangements: Spouse/significant other;Children   Abuse/Neglect [facility]) Physical Abuse: Denies Verbal Abuse: Denies Sexual Abuse: Denies Allergies:   Allergies  Allergen Reactions  . Cefepime Itching  . Penicillins Hives and  Swelling    ACT Assessment Complete:  Yes:    Educational Status    Risk to Self: Risk to self Is patient at risk for suicide?: No Substance abuse history and/or treatment for substance abuse?: No  Risk to Others:    Abuse: Abuse/Neglect Assessment (Assessment to be complete while patient is alone) Physical Abuse: Denies Verbal Abuse: Denies Sexual Abuse: Denies Exploitation of patient/patient's resources: Denies Self-Neglect: Denies  Prior Inpatient Therapy:    Prior Outpatient Therapy:    Additional Information:                    Objective: Blood pressure 148/49, pulse 74, temperature 97.1 F (36.2 C), temperature source Oral, resp. rate 19, height 6' (1.829 m), weight 235 lb 0.2 oz (106.6 kg), SpO2 94.00%.Body mass index is 31.87 kg/(m^2). Results for orders placed during the hospital encounter of 04/13/13 (from the past 72 hour(s))  GLUCOSE, CAPILLARY     Status: None   Collection Time    04/18/13  4:59 PM      Result Value Ref Range   Glucose-Capillary 80  70 - 99 mg/dL  GLUCOSE, CAPILLARY     Status: Abnormal   Collection Time    04/18/13  8:27 PM      Result Value Ref Range   Glucose-Capillary 101 (*) 70 - 99 mg/dL   Comment 1 Notify RN    GLUCOSE, CAPILLARY     Status: Abnormal   Collection Time    04/18/13 11:31 PM      Result Value Ref Range   Glucose-Capillary 103 (*) 70 - 99 mg/dL   Comment 1 Notify RN    GLUCOSE, CAPILLARY     Status: None   Collection Time    04/19/13  3:51 AM      Result Value Ref Range   Glucose-Capillary 91  70 - 99 mg/dL   Comment 1 Notify RN    GLUCOSE, CAPILLARY     Status: Abnormal   Collection Time    04/19/13  8:34 AM      Result Value Ref Range   Glucose-Capillary 143 (*) 70 - 99 mg/dL   Comment 1 Documented in Chart     Comment 2 Notify RN    GLUCOSE, CAPILLARY     Status: Abnormal   Collection Time    04/19/13 11:12 AM      Result Value Ref Range   Glucose-Capillary 150 (*) 70 - 99 mg/dL   Comment 1  Documented in Chart     Comment 2 Notify RN    GLUCOSE, CAPILLARY     Status: Abnormal   Collection Time    04/19/13  3:41 PM      Result Value Ref Range   Glucose-Capillary 152 (*) 70 - 99 mg/dL  Comment 1 Documented in Chart     Comment 2 Notify RN    GLUCOSE, CAPILLARY     Status: Abnormal   Collection Time    04/19/13  7:38 PM      Result Value Ref Range   Glucose-Capillary 158 (*) 70 - 99 mg/dL   Comment 1 Notify RN    GLUCOSE, CAPILLARY     Status: Abnormal   Collection Time    04/19/13 11:59 PM      Result Value Ref Range   Glucose-Capillary 165 (*) 70 - 99 mg/dL   Comment 1 Notify RN    GLUCOSE, CAPILLARY     Status: Abnormal   Collection Time    04/20/13  7:44 AM      Result Value Ref Range   Glucose-Capillary 120 (*) 70 - 99 mg/dL   Comment 1 Documented in Chart     Comment 2 Notify RN    GLUCOSE, CAPILLARY     Status: Abnormal   Collection Time    04/20/13 11:32 AM      Result Value Ref Range   Glucose-Capillary 125 (*) 70 - 99 mg/dL   Comment 1 Documented in Chart     Comment 2 Notify RN    GLUCOSE, CAPILLARY     Status: Abnormal   Collection Time    04/20/13  9:08 PM      Result Value Ref Range   Glucose-Capillary 120 (*) 70 - 99 mg/dL  CBC     Status: Abnormal   Collection Time    04/21/13  7:35 AM      Result Value Ref Range   WBC 9.3  4.0 - 10.5 K/uL   RBC 2.98 (*) 4.22 - 5.81 MIL/uL   Hemoglobin 9.4 (*) 13.0 - 17.0 g/dL   HCT 27.8 (*) 39.0 - 52.0 %   MCV 93.3  78.0 - 100.0 fL   MCH 31.5  26.0 - 34.0 pg   MCHC 33.8  30.0 - 36.0 g/dL   RDW 12.1  11.5 - 15.5 %   Platelets 260  150 - 400 K/uL  RENAL FUNCTION PANEL     Status: Abnormal   Collection Time    04/21/13  7:35 AM      Result Value Ref Range   Sodium 137  137 - 147 mEq/L   Potassium 4.0  3.7 - 5.3 mEq/L   Chloride 93 (*) 96 - 112 mEq/L   CO2 23  19 - 32 mEq/L   Glucose, Bld 152 (*) 70 - 99 mg/dL   BUN 45 (*) 6 - 23 mg/dL   Creatinine, Ser 9.33 (*) 0.50 - 1.35 mg/dL   Calcium 9.4   8.4 - 10.5 mg/dL   Phosphorus 6.4 (*) 2.3 - 4.6 mg/dL   Albumin 2.8 (*) 3.5 - 5.2 g/dL   GFR calc non Af Amer 5 (*) >90 mL/min   GFR calc Af Amer 6 (*) >90 mL/min   Comment: (NOTE)     The eGFR has been calculated using the CKD EPI equation.     This calculation has not been validated in all clinical situations.     eGFR's persistently <90 mL/min signify possible Chronic Kidney     Disease.  GLUCOSE, CAPILLARY     Status: None   Collection Time    04/21/13 12:10 PM      Result Value Ref Range   Glucose-Capillary 99  70 - 99 mg/dL   Labs are reviewed.  Current Facility-Administered  Medications  Medication Dose Route Frequency Provider Last Rate Last Dose  . acetaminophen (TYLENOL) tablet 650 mg  650 mg Oral Q6H PRN Barton Dubois, MD   650 mg at 04/21/13 0815   Or  . acetaminophen (TYLENOL) suppository 650 mg  650 mg Rectal Q6H PRN Barton Dubois, MD      . albuterol (PROVENTIL) (2.5 MG/3ML) 0.083% nebulizer solution 2.5 mg  2.5 mg Nebulization Q4H PRN Barton Dubois, MD   2.5 mg at 04/13/13 1937  . artificial tears (LACRILUBE) ophthalmic ointment 1 application  1 application Right Eye BID Barton Dubois, MD   1 application at 23/95/32 1220  . aspirin tablet 325 mg  325 mg Oral Daily Barton Dubois, MD   325 mg at 04/19/13 2100  . brimonidine (ALPHAGAN) 0.2 % ophthalmic solution 1 drop  1 drop Both Eyes BID Barton Dubois, MD   1 drop at 04/21/13 1220  . cinacalcet (SENSIPAR) tablet 60 mg  60 mg Oral BID WC Barton Dubois, MD   60 mg at 04/20/13 0800  . clopidogrel (PLAVIX) tablet 75 mg  75 mg Oral Q breakfast Cherene Altes, MD   75 mg at 04/19/13 2100  . darbepoetin (ARANESP) injection 100 mcg  100 mcg Intravenous Q Tue-HD Windy Kalata, MD   100 mcg at 04/21/13 [phone]  . fluticasone (FLOVENT HFA) 44 MCG/ACT inhaler 1 puff  1 puff Inhalation BID Cherene Altes, MD   1 puff at 04/20/13 [phone]  . guaiFENesin (ROBITUSSIN) 100 MG/5ML solution 100 mg  5 mL Oral Q4H PRN Sol Blazing,  MD      . heparin injection 5,000 Units  5,000 Units Subcutaneous 3 times per day Barton Dubois, MD   5,000 Units at 04/20/13 1317  . insulin aspart (novoLOG) injection 0-9 Units  0-9 Units Subcutaneous TID WC Cherene Altes, MD   1 Units at 04/20/13 1318  . midodrine (PROAMATINE) tablet 5 mg  5 mg Oral TID WC Alvia Grove, PA-C      . multivitamin (RENA-VIT) tablet 1 tablet  1 tablet Oral QHS Barton Dubois, MD   1 tablet at 04/19/13 2151  . ondansetron (ZOFRAN) tablet 4 mg  4 mg Oral Q6H PRN Barton Dubois, MD       Or  . ondansetron [facility]) injection 4 mg  4 mg Intravenous Q6H PRN Barton Dubois, MD      . pantoprazole (PROTONIX) EC tablet 40 mg  40 mg Oral Daily Barton Dubois, MD   40 mg at 04/19/13 2100  . polyvinyl alcohol (LIQUIFILM TEARS) 1.4 % ophthalmic solution 1 drop  1 drop Both Eyes PRN Barton Dubois, MD   1 drop at 04/19/13 0931  . rOPINIRole (REQUIP) tablet 0.5 mg  0.5 mg Oral QHS Barton Dubois, MD   0.5 mg at 04/19/13 2152  . sevelamer carbonate (RENVELA) tablet 1,600 mg  1,600 mg Oral TID WC Cherene Altes, MD   1,600 mg at 04/21/13 1220  . simvastatin (ZOCOR) tablet 20 mg  20 mg Oral q1800 Barton Dubois, MD   20 mg at 04/19/13 2100  . vitamin B-12 (CYANOCOBALAMIN) tablet 1,000 mcg  1,000 mcg Oral Daily Barton Dubois, MD   1,000 mcg at 04/19/13 2100    Psychiatric Specialty Exam:     Blood pressure 148/49, pulse 74, temperature 97.1 F (36.2 C), temperature source Oral, resp. rate 19, height 6' (1.829 m), weight 235 lb 0.2 oz (106.6 kg), SpO2 94.00%.Body mass index is 31.87 kg/(m^2).  General Appearance: Disheveled and confused  Eye Contact::  Poor  Speech:  Slurred  Volume:  Increased  Mood:  Irritable  Affect:  Inappropriate and Labile  Thought Process:  Loose  Orientation:  Negative  Thought Content:  Incoherent thinking  Suicidal Thoughts:  No  Homicidal Thoughts:  No  Memory:  Poor  Judgement:  Impaired  Insight:  Shallow  Psychomotor Activity:   Increased  Concentration:  Poor  Recall:  Poor  Fund of Knowledge:Poor  Language: Poor  Akathisia:  No  Handed:  Right  AIMS (if indicated):     Assets:  Social Support  Sleep:      Musculoskeletal: Strength & Muscle Tone: Unable to assess Gait & Station: Unable to assess Patient leans: N/A  Treatment Plan Summary: Patient does not have capacity to participate in his treatment plan.  Arlyce Harman Raef Sprigg 04/21/2013 4:55 PM

## 2013-04-22 ENCOUNTER — Encounter (HOSPITAL_COMMUNITY): Payer: Self-pay | Admitting: Nephrology

## 2013-04-22 LAB — GLUCOSE, CAPILLARY
GLUCOSE-CAPILLARY: 173 mg/dL — AB (ref 70–99)
Glucose-Capillary: 150 mg/dL — ABNORMAL HIGH (ref 70–99)
Glucose-Capillary: 141 mg/dL — ABNORMAL HIGH (ref 70–99)

## 2013-04-22 MED ORDER — CINACALCET HCL 30 MG PO TABS: 60.0000 mg | ORAL_TABLET | Freq: Two times a day (BID) | ORAL | Status: DC

## 2013-04-22 MED ORDER — INSULIN ASPART 100 UNIT/ML ~~LOC~~ SOLN: SUBCUTANEOUS | Status: DC

## 2013-04-22 MED ORDER — METOPROLOL TARTRATE 50 MG PO TABS: 50.0000 mg | ORAL_TABLET | Freq: Three times a day (TID) | ORAL | Status: DC | PRN

## 2013-04-22 MED ORDER — METOPROLOL TARTRATE 50 MG PO TABS
50.0000 mg | ORAL_TABLET | Freq: Three times a day (TID) | ORAL | Status: DC | PRN
  Filled 2013-04-22: qty 1

## 2013-04-22 MED ORDER — RENA-VITE PO TABS: 1.0000 | ORAL_TABLET | Freq: Every day | ORAL | Status: DC

## 2013-04-22 MED ORDER — MIDODRINE HCL 5 MG PO TABS: 5.0000 mg | ORAL_TABLET | Freq: Three times a day (TID) | ORAL | Status: DC

## 2013-04-22 MED ORDER — ARTIFICIAL TEARS OP OINT
1.0000 | TOPICAL_OINTMENT | Freq: Two times a day (BID) | OPHTHALMIC | Status: DC
Start: 2013-04-22 — End: 2013-09-16

## 2013-04-22 MED ORDER — ACETAMINOPHEN 325 MG PO TABS: 650.0000 mg | ORAL_TABLET | Freq: Four times a day (QID) | ORAL | Status: DC | PRN

## 2013-04-22 MED ORDER — METOPROLOL TARTRATE 50 MG PO TABS
50.0000 mg | ORAL_TABLET | Freq: Once | ORAL | Status: AC
  Administered 2013-04-22: 50 mg via ORAL
  Filled 2013-04-22: qty 1

## 2013-04-22 NOTE — Discharge Summary (Signed)
Physician Discharge Summary  [name] WUJ:[id number] DOB: [birth year] DOA: 04/13/2013  PCP: [name]., MD  Admit date: 04/13/2013 Discharge date: 04/22/2013  Time spent: >30 minutes  Recommendations for Outpatient Follow-up:  1. Continue usual T-Th-Sat HD schedule 2. Discharge to [facility] 3. Continue hs CPAP 4. Anti HTN meds stopped this admission in setting of dialysis days ortho stasis and symptomatic bradycardia - Midodrine was initiated this admisison - prn BP meds may be required between HD days for episodic severe HTN  Discharge Diagnoses:     Encephalopathy acute due to:   A) Opioid overdose   B) acute on chronic hypercarbia and hypoxia   Acute delirium without capacity to make decisions regarding his healthcare   Acute and chronic respiratory failure on O2-stable   COPD (chronic obstructive pulmonary disease)   OSA (obstructive sleep apnea)/Obesity hypoventilation syndrome- noncompliant with HS CPAP   ESRD (end stage renal disease) on dialysis (T-Th-Sat)   DM2 (diabetes mellitus, type 2)   Chronic pain-narcotic and neurontin dc'd   CAD- non obstructive 2011   HTN (hypertension) with orthostatic hypotension   GERD (gastroesophageal reflux disease)   Hypotension   Wenckebach second degree AV block-offending meds dc'd this admission   RBBB   Aortic stenosis- mild to moderate with normal LVF   Diastolic CHF   Discharge Condition: stable  Diet recommendation: Carbohydrate modified renal  Filed Weights   04/20/13 2110 04/21/13 0706 04/21/13 1115  Weight: 108.3 kg (238 lb 12.1 oz) 109.5 kg (241 lb 6.5 oz) 106.6 kg (235 lb 0.2 oz)    History of present illness:  68 year old M PMHx multiple medical problems including "fracture" of left hip 2 weeks ago unrepaired per wife (same hip S/P THA 1998), chronic kidney disease on dialysis T/Th/Sat, chronic hypoxemic respiratory failure due to COPD on 3 L nasal cannula oxygen at home, sleep apnea  noncompliant with CPAP, and diabetes. He also has orthostatic hypotension on dialysis days and is on chronic midodrine.   Patient apparently had been experiencing multiple falls and was having difficulty getting up despite assistance from his wife. On the date of admission he was more lethargic with respiratory difficulties and increasing hypoxemia despite his usual oxygen. Recent start by the [facility] on OxyIR and wife was concerned patient may have taken more than was prescribed.   Upon arrival to the ER patient had mild acidosis with hypercapnia and hypoxemia. Patient condition drastically improved with administration of Narcan and use of BiPAP.    Hospital Course:   Encephalopathy - acute  -Ultram dc'd 4/16 - stopped Neurontin this admission as well -Suspect altered mental status due to noncompliance with CPAP + sedating meds + episodic bradycardia - continue BiPAP prn when pt will allow, though he refuses virtually any time he is alert enough to do so  -4/21 counseled patient that his continued noncompliance with CPAP/BiPAP use, and overuse of narcotics will eventually result in significant increase mortality and morbidity. His wife understands but pt lacks capacity (see below)  Acute delirium/possible dementia -pt has had variable MS since admission -pt appeared to lack insight into his health status or rationale for prescribed therapies -psychiatry consulted and the patient was evaluated 4/21 and was determined NOT to have the capacity to participate in his treatment plan -today pt very angry about dc disposition and informed the MD that if he wasn't to be dc'd to his home that he would NOT wear the CPAP but if dc'd to home he would  adhere to CPAP therapy  Symptomatic bradycardia / [name]  -bradycardia was associated with hypotension and mental status changes  -Cardiology consulted - rec Dopamine but hr/BP spontaneously recovered and Dopamine was not required  -suspect this was  likely contributing to frequent falls at home  -dc'd Lopressor - pt then refused to wear tele therefore further eval was been impaired  -4/21 patient stable for discharge with stable pulse rates on routine VS checks  Acute and chronic respiratory failure:  A) Baseline COPD (chronic obstructive pulmonary disease)  B) OSA/Obesity hypoventilation syndrome  C) ?LLL infiltrate - ?Pneumonia  -responded to BIPAP during episodes of severe lethargy, but then refused to wear once became more alert  -not likley much that can be done to help him long term as long as he continues to be noncompliant  -pt non adherent with CPAP at home  -intermittently refuses to wear even his nasal cannula oxygen  -afebrile - no WBC elevation - nonetheless, the pt did c/o cough, and CXR from 4/17 does in fact suggest possible LLL infiltrate v/s atx but by 4/21 CXR negative for pneumonia so antibiotics stopped -current CPAP settings: 3 L bleed in, large mask and auto-titration from 5-20 cm H2O  Coag neg staph Bacteremia  -now only 1/6 bottles positive thus far - likely contaminant  -since HD pt does have risk factors for true bacteremia - follow out to 5 full days of surveillance  -4/21 most likely a contaminant since patient has had multiple blood cultures drawn which have been negative   End stage renal disease on dialysis  -as per Nephro  -T-Th-Sa usual HD days.  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  -per family pt had limited mobility post hip repair in 1997 progressive worsening of mobility - by 2006 essentially WC bound   DM2  -CBGs controlled with SSI so will continue upon dc   Obesity  - Body mass index is 32.46 kg/(m^2).   CAD  -Continue Plavix   DIASTOLIC CHF/HTN /orthostatic hypotension -issues with orthostasis on HD days could be contributing to falls -Midodrine was started this admission -Nephro has dc'd hydralazine  -Echo done last May 2014 with preserved LV function,, with left atrium moderate -severe dilation  -Patient currently not on beta blocker, Ace, nitrate secondary to his previous episode of symptomatic bradycardia.continue to hold all of these medications -consider SLOW add-in anti HTN meds after discharge-recommend not using beta blockers or calcium channel blockers  GERD  -cont PPI  Procedures: Transthoracic Echocardiography - Left ventricle: The cavity size was normal. There was mild concentric hypertrophy. Systolic function was normal. The estimated ejection fraction was in the range of 55% to 60%. Wall motion was normal; there were no regional wall motion abnormalities. - Aortic valve: There was mild to moderate stenosis. Valve area: 1.75cm^2(VTI). Valve  area: 1.64cm^2 (Vmax). - Mitral valve: Mildly calcified annulus. Mildly calcified leaflets . Valve area by continuity equation (using LVOT flow): 2.51cm^2. - Left atrium: The atrium was moderately to severely dilated.   Consultations:  Nephrology  Cardiology  Psychiatry  Discharge Exam: Filed Vitals:   04/22/13 1649  BP: 188/100  Pulse: 91  Temp: 97.5 F (36.4 C)  Resp: 20    General: more alert today - very confused - angry that he is not going home at dc - asked MD to get "the machine out of the closet so I can sit on it" Lungs: Very distant breath sounds in all fields with no focal  crackles or wheeze  Cardiovascular: Regular rate and rhythm without murmur gallop or rub - distant heart sounds  Abdomen: morbidly obese, nontender, nondistended but is obese, soft, bowel sounds positive, no rebound, no ascites, no appreciable mass  Musculoskeletal: No significant cyanosis, clubbing of bilateral lower extremities  Discharge Instructions You were cared for by a hospitalist during your hospital stay. If you have any questions about your discharge medications or the care you received while you were in the hospital after you are discharged, you can call the unit and asked to speak with the hospitalist on call if the hospitalist that took care of you is not available. Once you are discharged, your primary care physician will handle any further medical issues. Please note that NO REFILLS for any discharge medications will be authorized once you are discharged, as it is imperative that you return to your primary care physician (or establish a relationship with a primary care physician if you do not have one) for your aftercare needs so that they can reassess your need for medications and monitor your lab values.      Discharge Orders   Future Orders Complete By Expires   Diet - low sodium heart healthy  As directed    Diet general  As directed    Scheduling Instructions:   RENAL   Discharge instructions  As directed    Increase activity slowly  As directed        Medication List    STOP taking these medications       folic acid-vitamin b complex-vitamin c-selenium-zinc 3 MG Tabs tablet     gabapentin 100 MG capsule  Commonly known as:  NEURONTIN     hydrALAZINE 10 MG tablet  Commonly known as:  APRESOLINE     insulin glargine 100 UNIT/ML injection  Commonly known as:  LANTUS     oxycodone 5 MG capsule  Commonly known as:  OXY-IR      TAKE these medications       acetaminophen 325 MG tablet  Commonly known as:  TYLENOL  Take 2 tablets (650 mg total) by mouth every  6 (six) hours as needed for mild pain (or Fever >/= 101).     albuterol 108 (90 BASE) MCG/ACT inhaler  Commonly known as:  PROVENTIL HFA;VENTOLIN HFA  Inhale into the lungs every 6 (six) hours as needed for wheezing or shortness of breath.     albuterol (2.5 MG/3ML) 0.083% nebulizer solution  Commonly known as:  PROVENTIL  Take 2.5 mg by nebulization every 6 (six) hours as needed for wheezing or shortness of breath.     artificial tears Oint ophthalmic ointment  Place 1 application into the right eye 2 (two) times daily.     aspirin 325 MG tablet  Take 1 tablet (325 mg total) by mouth daily.  beclomethasone 40 MCG/ACT inhaler  Commonly known as:  QVAR  Inhale 1 puff into the lungs 2 (two) times daily.     brimonidine 0.2 % ophthalmic solution  Commonly known as:  ALPHAGAN  Place 1 drop into both eyes 2 (two) times daily.     calcium acetate 667 MG capsule  Commonly known as:  PHOSLO  Take 3,335 mg by mouth 3 (three) times daily with meals.     calcium-vitamin D 500-200 MG-UNIT per tablet  Commonly known as:  OSCAL WITH D  Take 1 tablet by mouth.     cinacalcet 30 MG tablet  Commonly known as:  SENSIPAR  Take 2 tablets (60 mg total) by mouth 2 (two) times daily with a meal.     clopidogrel 75 MG tablet  Commonly known as:  PLAVIX  Take 75 mg by mouth daily with breakfast.     cyanocobalamin 500 MCG tablet  Take 1,000 mcg by mouth daily.     insulin aspart 100 UNIT/ML injection  Commonly known as:  novoLOG  - Check CBG AC and HS  - CBG 121-150   1 unit  - CBG 151-200   2 units  - CBG 201-250   3 units  - CBG 251-300   5 units  - CBG 301-350   7 units  - CBG 351-400   9 units  - CBG > 400   CALL MD     metoprolol 50 MG tablet  Commonly known as:  LOPRESSOR  Take 1 tablet (50 mg total) by mouth 3 (three) times daily as needed (as needed for systolic BP > 180).     midodrine 5 MG tablet  Commonly known as:  PROAMATINE  Take 1 tablet (5 mg total) by  mouth 3 (three) times daily with meals.     multivitamin Tabs tablet  Take 1 tablet by mouth at bedtime.     omeprazole 20 MG capsule  Commonly known as:  PRILOSEC  Take 20 mg by mouth 2 (two) times daily.     pravastatin 40 MG tablet  Commonly known as:  PRAVACHOL  Take 40 mg by mouth at bedtime.     REFRESH CELLUVISC 1 % ophthalmic solution  Generic drug:  carboxymethylcellulose  Place 1 drop into both eyes every 2 (two) hours.     rOPINIRole 0.25 MG tablet  Commonly known as:  REQUIP  Take 0.5 mg by mouth at bedtime. 1 tablet twice a day and 2 tablets at bedtime.     sennosides-docusate sodium 8.6-50 MG tablet  Commonly known as:  SENOKOT-S  Take 1 tablet by mouth 2 (two) times daily as needed for constipation. For constipation       Allergies  Allergen Reactions  . Cefepime Itching  . Penicillins Hives and Swelling   Follow-up Information   Follow up with [name]., MD. (CONTINUE CURRENT [facility] SCHEDULE[facility])    Specialty:  Nephrology   Contact information:   [address] [facility] [place] [address] [phone]       The results of significant diagnostics from this hospitalization (including imaging, microbiology, ancillary and laboratory) are listed below for reference.    Significant Diagnostic Studies: Dg Chest 2 View  04/21/2013   CLINICAL DATA:  Evaluate infiltrate in the left base.  EXAM: CHEST  2 VIEW  COMPARISON:  DG CHEST 1V PORT dated 04/17/2013  FINDINGS: Shallow inspiration. Left costophrenic angle is not included within the field  of view. Heart size is increased. Pulmonary vascularity is mildly prominent. No focal airspace disease demonstrated in the visualized lungs. Prominent soft tissue densities in the mediastinum are similar to previous study. This is likely due to prominent mediastinal fat. Left central venous catheter is unchanged in position.  IMPRESSION: Technically limited study. Cardiac enlargement with  mild vascular congestion. No focal consolidation appreciated.   Electronically Signed   By: Burman Nieves M.D.   On: 04/21/2013 01:03   Dg Chest 2 View  04/13/2013   CLINICAL DATA:  Cough, shortness of Breath  EXAM: CHEST  2 VIEW  COMPARISON:  09/25/2012  FINDINGS: Cardiomegaly again noted. Stable left IJ dialysis catheter. Again noted calcified structure adjacent to aortic arch stable from prior exam. No acute infiltrate or pleural effusion. No pulmonary edema. Mild degenerative changes thoracic spine.  IMPRESSION: No active cardiopulmonary disease.   Electronically Signed   By: Natasha Mead M.D.   On: 04/13/2013 12:58   Ct Head Wo Contrast  04/13/2013   CLINICAL DATA:  68 year old male status post fall. Lethargy. Initial encounter.  EXAM: CT HEAD WITHOUT CONTRAST  CT CERVICAL SPINE WITHOUT CONTRAST  TECHNIQUE: Multidetector CT imaging of the head and cervical spine was performed following the standard protocol without intravenous contrast. Multiplanar CT image reconstructions of the cervical spine were also generated.  COMPARISON:  Head CTs without contrast 05/07/2012 and earlier.  FINDINGS: CT HEAD FINDINGS  Advanced calcified atherosclerosis noted diffusely. No acute scalp or orbits soft tissue abnormality identified. Increased widespread paranasal sinus mucosal thickening and opacification. Chronic bubbly opacity in the left sphenoid sinus. Stable mastoids and tympanic cavities. No skull fracture identified.  Stable cerebral volume. No ventriculomegaly. No midline shift, mass effect, or evidence of intracranial mass lesion. No acute intracranial hemorrhage identified. Stable nonspecific cerebral white matter hypodensity. No evidence of cortically based acute infarction identified. No suspicious intracranial vascular hyperdensity.  CT CERVICAL SPINE FINDINGS  Straightening of cervical lordosis. Visualized skull base is intact. No atlanto-occipital dissociation. No acute cervical spine fracture identified.  Retropharyngeal course of both carotid arteries with calcified plaque.  IMPRESSION: 1. Nonspecific cerebral white matter changes. No acute intracranial abnormality. 2. No acute fracture or listhesis identified in the cervical spine. Ligamentous injury is not excluded. 3. Advanced diffuse calcified atherosclerosis suggesting chronic renal disease/renal failure. 4. Increased paranasal sinus inflammation.   Electronically Signed   By: Augusto Gamble M.D.   On: 04/13/2013 14:12   Ct Cervical Spine Wo Contrast  04/13/2013   CLINICAL DATA:  68 year old male status post fall. Lethargy. Initial encounter.  EXAM: CT HEAD WITHOUT CONTRAST  CT CERVICAL SPINE WITHOUT CONTRAST  TECHNIQUE: Multidetector CT imaging of the head and cervical spine was performed following the standard protocol without intravenous contrast. Multiplanar CT image reconstructions of the cervical spine were also generated.  COMPARISON:  Head CTs without contrast 05/07/2012 and earlier.  FINDINGS: CT HEAD FINDINGS  Advanced calcified atherosclerosis noted diffusely. No acute scalp or orbits soft tissue abnormality identified. Increased widespread paranasal sinus mucosal thickening and opacification. Chronic bubbly opacity in the left sphenoid sinus. Stable mastoids and tympanic cavities. No skull fracture identified.  Stable cerebral volume. No ventriculomegaly. No midline shift, mass effect, or evidence of intracranial mass lesion. No acute intracranial hemorrhage identified. Stable nonspecific cerebral white matter hypodensity. No evidence of cortically based acute infarction identified. No suspicious intracranial vascular hyperdensity.  CT CERVICAL SPINE FINDINGS  Straightening of cervical lordosis. Visualized skull base is intact. No atlanto-occipital dissociation. No acute cervical  spine fracture identified. Retropharyngeal course of both carotid arteries with calcified plaque.  IMPRESSION: 1. Nonspecific cerebral white matter changes. No acute  intracranial abnormality. 2. No acute fracture or listhesis identified in the cervical spine. Ligamentous injury is not excluded. 3. Advanced diffuse calcified atherosclerosis suggesting chronic renal disease/renal failure. 4. Increased paranasal sinus inflammation.   Electronically Signed   By: Augusto Gamble M.D.   On: 04/13/2013 14:12   Ct Hip Left Wo Contrast  04/17/2013   CLINICAL DATA:  Question occult fracture, pain  EXAM: CT OF THE LEFT HIP WITHOUT CONTRAST  TECHNIQUE: Multidetector CT imaging was performed according to the standard protocol. Multiplanar CT image reconstructions were also generated.  COMPARISON:  None.  FINDINGS: There is a total left hip arthroplasty. There is beam hardening artifact resulting from the arthroplasty obscuring the adjacent soft tissue and osseous structures. There is no hardware failure or complication. There is no focal fluid collection or hematoma. There is no acute fracture or dislocation.  There is mild fatty atrophy of the musculature. There is peripheral vascular atherosclerotic disease.  IMPRESSION: Left total hip arthroplasty without hardware failure or complication.   Electronically Signed   By: Elige Ko   On: 04/17/2013 04:13   Dg Chest Port 1 View  04/17/2013   CLINICAL DATA:  Respiratory distress.  EXAM: PORTABLE CHEST - 1 VIEW  COMPARISON:  DG CHEST 2 VIEW dated 04/13/2013  FINDINGS: Left IJ dialysis catheter tip projects over the SVC or SVC RA junction. Cardiomediastinal silhouette is prominent but stable. Thoracic aorta is calcified. Lungs are grossly clear. No pleural fluid.  IMPRESSION: Image quality is degraded by body habitus.  Lungs are grossly clear.   Electronically Signed   By: Leanna Battles M.D.   On: 04/17/2013 20:47   Dg Hip Portable 1 View Left  04/14/2013   CLINICAL DATA:  Known prosthesis. Recent films at [facility] demonstrating fracture.  EXAM: PORTABLE LEFT HIP - 1 VIEW  COMPARISON:  02/14/2013  FINDINGS: There is a left total hip arthroplasty  intact and normally located. This is unchanged. There is no acute fracture or dislocation. Moderate arteriosclerosis is present.  IMPRESSION: Total left hip arthroplasty intact and unchanged. No acute findings.   Electronically Signed   By: Elberta Fortis M.D.   On: 04/14/2013 11:58    Microbiology: Recent Results (from the past 240 hour(s))  MRSA PCR SCREENING     Status: None   Collection Time    04/13/13  8:28 PM      Result Value Ref Range Status   MRSA by PCR NEGATIVE  NEGATIVE Final   Comment:            The GeneXpert MRSA Assay (FDA     approved for NASAL specimens     only), is one component of a     comprehensive MRSA colonization     surveillance program. It is not     intended to diagnose MRSA     infection nor to guide or     monitor treatment for     MRSA infections.  CULTURE, BLOOD (ROUTINE X 2)     Status: None   Collection Time    04/14/13  3:45 PM      Result Value Ref Range Status   Specimen Description BLOOD HEMODIALYSIS CATHETER   Final   Special Requests BOTTLES DRAWN AEROBIC AND ANAEROBIC 10CC   Final   Culture  Setup Time     Final   Value:  04/14/2013 18:46     Performed at [facility]   Culture     Final   Value: NO GROWTH 5 DAYS     Performed at [facility]   Report Status 04/20/2013 FINAL   Final  CULTURE, BLOOD (ROUTINE X 2)     Status: None   Collection Time    04/14/13  3:55 PM      Result Value Ref Range Status   Specimen Description BLOOD HEMODIALYSIS CATHETER   Final   Special Requests BOTTLES DRAWN AEROBIC AND ANAEROBIC 10CC   Final   Culture  Setup Time     Final   Value: 04/14/2013 18:45     Performed at [facility]   Culture     Final   Value: STAPHYLOCOCCUS SPECIES (COAGULASE NEGATIVE)     Note: THE SIGNIFICANCE OF ISOLATING THIS ORGANISM FROM A SINGLE SET OF BLOOD CULTURES WHEN MULTIPLE SETS ARE DRAWN IS UNCERTAIN. PLEASE NOTIFY THE MICROBIOLOGY DEPARTMENT WITHIN ONE WEEK IF SPECIATION AND SENSITIVITIES ARE  REQUIRED.     Note: Gram Stain Report Called to,Read Back By and Verified With: MAGGIE BAUGHM 04/15/13 1311 BY SMITHERSJ     Performed at [facility]   Report Status 04/16/2013 FINAL   Final  CULTURE, BLOOD (ROUTINE X 2)     Status: None   Collection Time    04/17/13 10:00 AM      Result Value Ref Range Status   Specimen Description BLOOD HEMODIALYSIS CATHETER   Final   Special Requests BOTTLES DRAWN AEROBIC AND ANAEROBIC 4CC   Final   Culture  Setup Time     Final   Value: 04/17/2013 16:08     Performed at [facility]   Culture     Final   Value:        BLOOD CULTURE RECEIVED NO GROWTH TO DATE CULTURE WILL BE HELD FOR 5 DAYS BEFORE ISSUING A FINAL NEGATIVE REPORT     Performed at [facility]   Report Status PENDING   Incomplete  CULTURE, BLOOD (ROUTINE X 2)     Status: None   Collection Time    04/17/13 11:06 AM      Result Value Ref Range Status   Specimen Description BLOOD LEFT ANTECUBITAL   Final   Special Requests BOTTLES DRAWN AEROBIC ONLY 10CC   Final   Culture  Setup Time     Final   Value: 04/17/2013 16:08     Performed at [facility]   Culture     Final   Value:        BLOOD CULTURE RECEIVED NO GROWTH TO DATE CULTURE WILL BE HELD FOR 5 DAYS BEFORE ISSUING A FINAL NEGATIVE REPORT     Performed at [facility]   Report Status PENDING   Incomplete  CULTURE, BLOOD (ROUTINE X 2)     Status: None   Collection Time    04/18/13  7:54 AM      Result Value Ref Range Status   Specimen Description BLOOD RIGHT ARM   Final   Special Requests BOTTLES DRAWN AEROBIC ONLY 10CC   Final   Culture  Setup Time     Final   Value: 04/18/2013 17:20     Performed at [facility]   Culture     Final   Value:        BLOOD CULTURE RECEIVED NO GROWTH TO DATE CULTURE WILL BE HELD FOR 5 DAYS  BEFORE ISSUING A FINAL NEGATIVE REPORT     Performed at [facility]   Report Status PENDING   Incomplete  CULTURE, BLOOD (ROUTINE X 2)     Status:  None   Collection Time    04/18/13  8:45 AM      Result Value Ref Range Status   Specimen Description BLOOD HEMODIALYSIS CATHETER   Final   Special Requests BOTTLES DRAWN AEROBIC AND ANAEROBIC 10CC   Final   Culture  Setup Time     Final   Value: 04/18/2013 17:20     Performed at [facility]   Culture     Final   Value:        BLOOD CULTURE RECEIVED NO GROWTH TO DATE CULTURE WILL BE HELD FOR 5 DAYS BEFORE ISSUING A FINAL NEGATIVE REPORT     Performed at [facility]   Report Status PENDING   Incomplete     Labs: Basic Metabolic Panel:  Recent Labs Lab 04/16/13 0234 04/16/13 1852 04/17/13 0637 04/18/13 0400 04/18/13 1632 04/21/13 0735  NA 135* 135* 135* 137 134* 137  K 4.8 3.8 4.0 4.4 3.6* 4.0  CL 90* 94* 93* 94* 94* 93*  CO2 25 25 26 25 23 23   GLUCOSE 151* 98 82 109* 82 152*  BUN 50* 22 27* 39* 20 45*  CREATININE 8.52* 4.79* 5.88* 7.71* 4.94* 9.33*  CALCIUM 9.9 9.6 10.3 10.7* 10.3 9.4  MG  --  1.7  --   --   --   --   PHOS 9.1*  --   --  7.9*  --  6.4*   Liver Function Tests:  Recent Labs Lab 04/16/13 0234 04/16/13 1852 04/18/13 0400 04/21/13 0735  AST  --  7  --   --   ALT  --  9  --   --   ALKPHOS  --  53  --   --   BILITOT  --  0.4  --   --   PROT  --  6.8  --   --   ALBUMIN 2.9* 2.8* 3.0* 2.8*   No results found for this basename: LIPASE, AMYLASE,  in the last 168 hours  Recent Labs Lab 04/18/13 0400  AMMONIA 22   CBC:  Recent Labs Lab 04/16/13 0230 04/18/13 0400 04/21/13 0735  WBC 14.0* 9.8 9.3  HGB 10.3* 10.2* 9.4*  HCT 32.7* 31.5* 27.8*  MCV 98.2 96.3 93.3  PLT 234 246 260   Cardiac Enzymes:  Recent Labs Lab 04/15/13 2347 04/16/13 1852 04/17/13 0637  TROPONINI <0.30 <0.30 <0.30   BNP: BNP (last 3 results) No results found for this basename: PROBNP,  in the last 8760 hours CBG:  Recent Labs Lab 04/21/13 1210 04/21/13 1720 04/22/13 0731 04/22/13 1158 04/22/13 1644  GLUCAP 99 194* 150* 173* 141*    Signed:  Junious Silk, NP [facility] 04/22/2013, 5:04 PM  I have personally examined this patient and reviewed the entire database. I have reviewed the above note, made any necessary editorial changes, and agree with its content.  Lonia Blood, MD [facility]

## 2013-04-22 NOTE — Progress Notes (Signed)
[facility] Progress Note  Subjective:   More confused today. Won't stay in bed. Thinks he's going home  Objective Filed Vitals:   04/21/13 2108 04/21/13 2128 04/22/13 0516 04/22/13 0732  BP:  144/56 150/81 163/112  Pulse: 84 90 93 101  Temp:  98.4 F (36.9 C) 98.1 F (36.7 C) 98.9 F (37.2 C)  TempSrc:  Oral Oral Oral  Resp: 22 22 22 21   Height:      Weight:      SpO2:  95% 94% 99%   Physical Exam General: Ill-appearing, incoherent speech, NAD Heart: RRR Lungs: Diminished throughout, + cough, no appreciable wheezes/rales Abdomen: obese, NT, + BS Extremities: No LE edema Dialysis Access: Lt I-J-TDC  Dialysis: TTS [facility] [place] [facility] [place] [facility]  4h 112.5kg 2/2.0 Bath P4 Heparin 8000 L IJ catheter  Epo 1000 Venofer 50/wk   Assessment/Plan: 1. Acute on chronic resp failure w AMS - Improving. Sedating meds on hold. Mgmt per primary 2. Delirium - Lacks capacity to participate in care plan per psych; MS is close to baseline, have d/w pt's wife, would not workup further, mostly sundowning most likely 3. COPD / OHS / OSA - nonadherent with home 02 and CPAP. Mgmt per primary 4. ESRD - TTS, K+ 4, Next HD tomorrow 5. BP/Volume - SBPs 150s-160s. Now under edw by ~6kg. Midodrine decreased to 5 mg TID. Challenge EDW tomorrow. May need hold parameters or discontinuation of midodrine all together. 6. Anemia - Hgb 9.4, Aranesp 100 q Tues. Weekly IV Fe. 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 P 6.4. Not on Vit D. Continue low Ca bath, Sensipar and Renvela  8. Bradycardia - improved, MTP stopped, pt refuses tele 9. Coag neg staph bacteremia - likely contaminant  10. Chronic L hip pain / hx L THA  11. Debility- WC bound 12. DM2 13. Dispo - To SNF, most likely [facility] in [place]. [facility] three times a week like they had been doing for him when he was at home.  14. DNR- this was discussed with pt's wife and patient and  they request DNR status   [name]. [name], PA-C [facility] Pager [phone] 04/22/2013,10:49 AM  LOS: 9 days   Pt seen, examined, agree w assess/plan as above with additions as indicated.  Vinson Moselle MD pager [phone]    cell [phone] 04/22/2013, 12:57 PM   Additional Objective Labs: Basic Metabolic Panel:  Recent Labs Lab 04/16/13 0234  04/18/13 0400 04/18/13 1632 04/21/13 0735  NA 135*  < > 137 134* 137  K 4.8  < > 4.4 3.6* 4.0  CL 90*  < > 94* 94* 93*  CO2 25  < > 25 23 23   GLUCOSE 151*  < > 109* 82 152*  BUN 50*  < > 39* 20 45*  CREATININE 8.52*  < > 7.71* 4.94* 9.33*  CALCIUM 9.9  < > 10.7* 10.3 9.4  PHOS 9.1*  --  7.9*  --  6.4*  < > = values in this interval not displayed. Liver Function Tests:  Recent Labs Lab 04/16/13 1852 04/18/13 0400 04/21/13 0735  AST 7  --   --   ALT 9  --   --   ALKPHOS 53  --   --   BILITOT 0.4  --   --   PROT 6.8  --   --   ALBUMIN 2.8* 3.0* 2.8*   CBC:  Recent Labs Lab  04/16/13 0230 04/18/13 0400 04/21/13 0735  WBC 14.0* 9.8 9.3  HGB 10.3* 10.2* 9.4*  HCT 32.7* 31.5* 27.8*  MCV 98.2 96.3 93.3  PLT 234 246 260   Blood Culture    Component Value Date/Time   SDES BLOOD HEMODIALYSIS CATHETER 04/18/2013 0845   SPECREQUEST BOTTLES DRAWN AEROBIC AND ANAEROBIC 10CC 04/18/2013 0845   CULT  Value:        BLOOD CULTURE RECEIVED NO GROWTH TO DATE CULTURE WILL BE HELD FOR 5 DAYS BEFORE ISSUING A FINAL NEGATIVE REPORT [facility] 04/18/2013 0845   REPTSTATUS PENDING 04/18/2013 0845    Cardiac Enzymes:  Recent Labs Lab 04/15/13 2347 04/16/13 1852 04/17/13 0637  TROPONINI <0.30 <0.30 <0.30   CBG:  Recent Labs Lab 04/20/13 1132 04/20/13 2108 04/21/13 1210 04/21/13 1720 04/22/13 0731  GLUCAP 125* 120* 99 194* 150*    Studies/Results: Dg Chest 2 View  04/21/2013   CLINICAL DATA:  Evaluate infiltrate in the left base.  EXAM: CHEST  2 VIEW  COMPARISON:  DG CHEST 1V PORT dated  04/17/2013  FINDINGS: Shallow inspiration. Left costophrenic angle is not included within the field of view. Heart size is increased. Pulmonary vascularity is mildly prominent. No focal airspace disease demonstrated in the visualized lungs. Prominent soft tissue densities in the mediastinum are similar to previous study. This is likely due to prominent mediastinal fat. Left central venous catheter is unchanged in position.  IMPRESSION: Technically limited study. Cardiac enlargement with mild vascular congestion. No focal consolidation appreciated.   Electronically Signed   By: Burman NievesWilliam  Stevens M.D.   On: 04/21/2013 01:03   Medications:   . artificial tears  1 application Right Eye BID  . aspirin  325 mg Oral Daily  . brimonidine  1 drop Both Eyes BID  . cinacalcet  60 mg Oral BID WC  . clopidogrel  75 mg Oral Q breakfast  . darbepoetin (ARANESP) injection - DIALYSIS  100 mcg Intravenous Q Tue-HD  . fluticasone  1 puff Inhalation BID  . heparin  5,000 Units Subcutaneous 3 times per day  . insulin aspart  0-9 Units Subcutaneous TID WC  . midodrine  5 mg Oral TID WC  . multivitamin  1 tablet Oral QHS  . pantoprazole  40 mg Oral Daily  . rOPINIRole  0.5 mg Oral QHS  . sevelamer carbonate  1,600 mg Oral TID WC  . simvastatin  20 mg Oral q1800  . cyanocobalamin  1,000 mcg Oral Daily

## 2013-04-23 LAB — CULTURE, BLOOD (ROUTINE X 2)
CULTURE: NO GROWTH
Culture: NO GROWTH

## 2013-04-24 LAB — CULTURE, BLOOD (ROUTINE X 2)
CULTURE: NO GROWTH
Culture: NO GROWTH

## 2013-09-16 ENCOUNTER — Encounter (HOSPITAL_COMMUNITY): Payer: Self-pay | Admitting: Emergency Medicine

## 2013-09-16 ENCOUNTER — Inpatient Hospital Stay (HOSPITAL_COMMUNITY)
Admission: EM | Admit: 2013-09-16 | Discharge: 2013-09-20 | DRG: 391 | Disposition: A | Discharge status: Home / Self Care | Payer: Medicare Other | Attending: Family Medicine | Admitting: Family Medicine

## 2013-09-16 DIAGNOSIS — I509 Heart failure, unspecified: Secondary | ICD-10-CM | POA: Diagnosis present

## 2013-09-16 DIAGNOSIS — H5440 Blindness, one eye, unspecified eye: Secondary | ICD-10-CM | POA: Diagnosis present

## 2013-09-16 DIAGNOSIS — I951 Orthostatic hypotension: Secondary | ICD-10-CM | POA: Diagnosis present

## 2013-09-16 DIAGNOSIS — E1139 Type 2 diabetes mellitus with other diabetic ophthalmic complication: Secondary | ICD-10-CM | POA: Diagnosis present

## 2013-09-16 DIAGNOSIS — G2581 Restless legs syndrome: Secondary | ICD-10-CM | POA: Diagnosis present

## 2013-09-16 DIAGNOSIS — J449 Chronic obstructive pulmonary disease, unspecified: Secondary | ICD-10-CM | POA: Diagnosis present

## 2013-09-16 DIAGNOSIS — D649 Anemia, unspecified: Secondary | ICD-10-CM | POA: Diagnosis present

## 2013-09-16 DIAGNOSIS — I471 Supraventricular tachycardia: Secondary | ICD-10-CM

## 2013-09-16 DIAGNOSIS — I2489 Other forms of acute ischemic heart disease: Secondary | ICD-10-CM | POA: Diagnosis present

## 2013-09-16 DIAGNOSIS — Z992 Dependence on renal dialysis: Secondary | ICD-10-CM

## 2013-09-16 DIAGNOSIS — E861 Hypovolemia: Secondary | ICD-10-CM | POA: Diagnosis present

## 2013-09-16 DIAGNOSIS — Z87891 Personal history of nicotine dependence: Secondary | ICD-10-CM

## 2013-09-16 DIAGNOSIS — A09 Infectious gastroenteritis and colitis, unspecified: Secondary | ICD-10-CM | POA: Diagnosis not present

## 2013-09-16 DIAGNOSIS — I9589 Other hypotension: Secondary | ICD-10-CM | POA: Diagnosis present

## 2013-09-16 DIAGNOSIS — R197 Diarrhea, unspecified: Secondary | ICD-10-CM

## 2013-09-16 DIAGNOSIS — Z9119 Patient's noncompliance with other medical treatment and regimen: Secondary | ICD-10-CM

## 2013-09-16 DIAGNOSIS — Z833 Family history of diabetes mellitus: Secondary | ICD-10-CM

## 2013-09-16 DIAGNOSIS — Z881 Allergy status to other antibiotic agents status: Secondary | ICD-10-CM

## 2013-09-16 DIAGNOSIS — I959 Hypotension, unspecified: Secondary | ICD-10-CM

## 2013-09-16 DIAGNOSIS — I252 Old myocardial infarction: Secondary | ICD-10-CM

## 2013-09-16 DIAGNOSIS — I498 Other specified cardiac arrhythmias: Secondary | ICD-10-CM | POA: Diagnosis present

## 2013-09-16 DIAGNOSIS — Z9981 Dependence on supplemental oxygen: Secondary | ICD-10-CM

## 2013-09-16 DIAGNOSIS — I69998 Other sequelae following unspecified cerebrovascular disease: Secondary | ICD-10-CM

## 2013-09-16 DIAGNOSIS — E785 Hyperlipidemia, unspecified: Secondary | ICD-10-CM | POA: Diagnosis present

## 2013-09-16 DIAGNOSIS — Z7982 Long term (current) use of aspirin: Secondary | ICD-10-CM

## 2013-09-16 DIAGNOSIS — E119 Type 2 diabetes mellitus without complications: Secondary | ICD-10-CM | POA: Diagnosis present

## 2013-09-16 DIAGNOSIS — I350 Nonrheumatic aortic (valve) stenosis: Secondary | ICD-10-CM

## 2013-09-16 DIAGNOSIS — I428 Other cardiomyopathies: Secondary | ICD-10-CM | POA: Diagnosis present

## 2013-09-16 DIAGNOSIS — J4489 Other specified chronic obstructive pulmonary disease: Secondary | ICD-10-CM | POA: Diagnosis present

## 2013-09-16 DIAGNOSIS — I2510 Atherosclerotic heart disease of native coronary artery without angina pectoris: Secondary | ICD-10-CM

## 2013-09-16 DIAGNOSIS — R55 Syncope and collapse: Secondary | ICD-10-CM

## 2013-09-16 DIAGNOSIS — Z880 Allergy status to penicillin: Secondary | ICD-10-CM

## 2013-09-16 DIAGNOSIS — N186 End stage renal disease: Secondary | ICD-10-CM | POA: Diagnosis present

## 2013-09-16 DIAGNOSIS — I359 Nonrheumatic aortic valve disorder, unspecified: Secondary | ICD-10-CM | POA: Diagnosis present

## 2013-09-16 DIAGNOSIS — N2581 Secondary hyperparathyroidism of renal origin: Secondary | ICD-10-CM | POA: Diagnosis present

## 2013-09-16 DIAGNOSIS — Z7902 Long term (current) use of antithrombotics/antiplatelets: Secondary | ICD-10-CM

## 2013-09-16 DIAGNOSIS — I120 Hypertensive chronic kidney disease with stage 5 chronic kidney disease or end stage renal disease: Secondary | ICD-10-CM | POA: Diagnosis present

## 2013-09-16 DIAGNOSIS — K219 Gastro-esophageal reflux disease without esophagitis: Secondary | ICD-10-CM | POA: Diagnosis present

## 2013-09-16 DIAGNOSIS — I5030 Unspecified diastolic (congestive) heart failure: Secondary | ICD-10-CM | POA: Diagnosis present

## 2013-09-16 DIAGNOSIS — H543 Unqualified visual loss, both eyes: Secondary | ICD-10-CM | POA: Diagnosis present

## 2013-09-16 DIAGNOSIS — Z96649 Presence of unspecified artificial hip joint: Secondary | ICD-10-CM

## 2013-09-16 DIAGNOSIS — D72829 Elevated white blood cell count, unspecified: Secondary | ICD-10-CM | POA: Diagnosis present

## 2013-09-16 DIAGNOSIS — I953 Hypotension of hemodialysis: Secondary | ICD-10-CM

## 2013-09-16 DIAGNOSIS — G4733 Obstructive sleep apnea (adult) (pediatric): Secondary | ICD-10-CM | POA: Diagnosis present

## 2013-09-16 DIAGNOSIS — R778 Other specified abnormalities of plasma proteins: Secondary | ICD-10-CM

## 2013-09-16 DIAGNOSIS — R7989 Other specified abnormal findings of blood chemistry: Secondary | ICD-10-CM

## 2013-09-16 DIAGNOSIS — Z794 Long term (current) use of insulin: Secondary | ICD-10-CM

## 2013-09-16 DIAGNOSIS — I4510 Unspecified right bundle-branch block: Secondary | ICD-10-CM | POA: Diagnosis present

## 2013-09-16 DIAGNOSIS — Z91199 Patient's noncompliance with other medical treatment and regimen due to unspecified reason: Secondary | ICD-10-CM

## 2013-09-16 DIAGNOSIS — I248 Other forms of acute ischemic heart disease: Secondary | ICD-10-CM | POA: Diagnosis present

## 2013-09-16 LAB — CBC WITH DIFFERENTIAL/PLATELET
BASOS ABS: 0 10*3/uL (ref 0.0–0.1)
BASOS PCT: 0 % (ref 0–1)
EOS ABS: 0.1 10*3/uL (ref 0.0–0.7)
Eosinophils Relative: 1 % (ref 0–5)
HCT: 37 % — ABNORMAL LOW (ref 39.0–52.0)
Hemoglobin: 11.9 g/dL — ABNORMAL LOW (ref 13.0–17.0)
LYMPHS ABS: 1.5 10*3/uL (ref 0.7–4.0)
Lymphocytes Relative: 14 % (ref 12–46)
MCH: 30.8 pg (ref 26.0–34.0)
MCHC: 32.2 g/dL (ref 30.0–36.0)
MCV: 95.9 fL (ref 78.0–100.0)
Monocytes Absolute: 0.8 10*3/uL (ref 0.1–1.0)
Monocytes Relative: 8 % (ref 3–12)
NEUTROS PCT: 77 % (ref 43–77)
Neutro Abs: 8.4 10*3/uL — ABNORMAL HIGH (ref 1.7–7.7)
Platelets: 214 10*3/uL (ref 150–400)
RBC: 3.86 MIL/uL — AB (ref 4.22–5.81)
RDW: 12.7 % (ref 11.5–15.5)
WBC: 10.8 10*3/uL — ABNORMAL HIGH (ref 4.0–10.5)

## 2013-09-16 LAB — BASIC METABOLIC PANEL
Anion gap: 16 — ABNORMAL HIGH (ref 5–15)
BUN: 21 mg/dL (ref 6–23)
CALCIUM: 9 mg/dL (ref 8.4–10.5)
CO2: 22 mEq/L (ref 19–32)
Chloride: 95 mEq/L — ABNORMAL LOW (ref 96–112)
Creatinine, Ser: 4.91 mg/dL — ABNORMAL HIGH (ref 0.50–1.35)
GFR, EST AFRICAN AMERICAN: 13 mL/min — AB (ref >90)
GFR, EST NON AFRICAN AMERICAN: 11 mL/min — AB (ref >90)
GLUCOSE: 178 mg/dL — AB (ref 70–99)
POTASSIUM: 4.5 meq/L (ref 3.7–5.3)
SODIUM: 133 meq/L — AB (ref 137–147)

## 2013-09-16 LAB — POC OCCULT BLOOD, ED: Fecal Occult Bld: NEGATIVE

## 2013-09-16 MED ORDER — MIDODRINE HCL 5 MG PO TABS
10.0000 mg | ORAL_TABLET | Freq: Three times a day (TID) | ORAL | Status: DC
  Administered 2013-09-17 – 2013-09-20 (×10): 10 mg via ORAL
  Filled 2013-09-16 (×13): qty 2

## 2013-09-16 MED ORDER — MIDODRINE HCL 5 MG PO TABS
5.0000 mg | ORAL_TABLET | Freq: Three times a day (TID) | ORAL | Status: DC
  Filled 2013-09-16: qty 1

## 2013-09-16 MED ORDER — ALBUTEROL SULFATE HFA 108 (90 BASE) MCG/ACT IN AERS: 1.0000 | INHALATION_SPRAY | Freq: Four times a day (QID) | RESPIRATORY_TRACT | Status: DC | PRN

## 2013-09-16 MED ORDER — CINACALCET HCL 30 MG PO TABS
60.0000 mg | ORAL_TABLET | Freq: Every day | ORAL | Status: DC
  Administered 2013-09-17 – 2013-09-20 (×3): 60 mg via ORAL
  Filled 2013-09-16 (×5): qty 2

## 2013-09-16 MED ORDER — PANTOPRAZOLE SODIUM 40 MG PO TBEC
40.0000 mg | DELAYED_RELEASE_TABLET | Freq: Every day | ORAL | Status: DC
  Administered 2013-09-17 – 2013-09-20 (×4): 40 mg via ORAL
  Filled 2013-09-16 (×3): qty 1

## 2013-09-16 MED ORDER — CALCIUM CARBONATE-VITAMIN D 500-200 MG-UNIT PO TABS
1.0000 | ORAL_TABLET | Freq: Every day | ORAL | Status: DC
  Administered 2013-09-17 – 2013-09-20 (×4): 1 via ORAL
  Filled 2013-09-16 (×5): qty 1

## 2013-09-16 MED ORDER — DIPHENOXYLATE-ATROPINE 2.5-0.025 MG PO TABS
2.0000 | ORAL_TABLET | Freq: Once | ORAL | Status: AC
  Administered 2013-09-16: 2 via ORAL
  Filled 2013-09-16: qty 2

## 2013-09-16 MED ORDER — CLOPIDOGREL BISULFATE 75 MG PO TABS
75.0000 mg | ORAL_TABLET | Freq: Every day | ORAL | Status: DC
  Administered 2013-09-17 – 2013-09-20 (×4): 75 mg via ORAL
  Filled 2013-09-16 (×6): qty 1

## 2013-09-16 MED ORDER — ROPINIROLE HCL 0.5 MG PO TABS
0.5000 mg | ORAL_TABLET | Freq: Every day | ORAL | Status: DC
  Administered 2013-09-17 – 2013-09-19 (×3): 0.5 mg via ORAL
  Filled 2013-09-16 (×4): qty 1

## 2013-09-16 MED ORDER — GABAPENTIN 100 MG PO CAPS
100.0000 mg | ORAL_CAPSULE | Freq: Three times a day (TID) | ORAL | Status: DC
  Administered 2013-09-17 – 2013-09-20 (×11): 100 mg via ORAL
  Filled 2013-09-16 (×16): qty 1

## 2013-09-16 MED ORDER — CALCIUM ACETATE 667 MG PO CAPS
3335.0000 mg | ORAL_CAPSULE | Freq: Three times a day (TID) | ORAL | Status: DC
  Administered 2013-09-17 – 2013-09-20 (×8): 3335 mg via ORAL
  Filled 2013-09-16 (×13): qty 5

## 2013-09-16 MED ORDER — BRIMONIDINE TARTRATE 0.2 % OP SOLN
1.0000 [drp] | Freq: Two times a day (BID) | OPHTHALMIC | Status: DC
  Administered 2013-09-17 – 2013-09-20 (×8): 1 [drp] via OPHTHALMIC
  Filled 2013-09-16 (×2): qty 5

## 2013-09-16 MED ORDER — CIPROFLOXACIN IN D5W 400 MG/200ML IV SOLN
400.0000 mg | INTRAVENOUS | Status: DC
  Administered 2013-09-17 – 2013-09-19 (×3): 400 mg via INTRAVENOUS
  Filled 2013-09-16 (×3): qty 200

## 2013-09-16 MED ORDER — MIDODRINE HCL 5 MG PO TABS: 5.0000 mg | ORAL_TABLET | Freq: Three times a day (TID) | ORAL | Status: DC

## 2013-09-16 MED ORDER — METRONIDAZOLE IN NACL 5-0.79 MG/ML-% IV SOLN
500.0000 mg | Freq: Three times a day (TID) | INTRAVENOUS | Status: DC
  Administered 2013-09-17 – 2013-09-18 (×6): 500 mg via INTRAVENOUS
  Filled 2013-09-16 (×9): qty 100

## 2013-09-16 MED ORDER — SODIUM CHLORIDE 0.9 % IJ SOLN
3.0000 mL | Freq: Two times a day (BID) | INTRAMUSCULAR | Status: DC
Start: 2013-09-16 — End: 2013-09-20
  Administered 2013-09-17 – 2013-09-18 (×3): 3 mL via INTRAVENOUS

## 2013-09-16 MED ORDER — SODIUM CHLORIDE 0.9 % IV SOLN
INTRAVENOUS | Status: DC
Start: 2013-09-16 — End: 2013-09-17
  Administered 2013-09-17: 01:00:00 via INTRAVENOUS

## 2013-09-16 MED ORDER — INSULIN GLARGINE 100 UNIT/ML ~~LOC~~ SOLN
25.0000 [IU] | Freq: Two times a day (BID) | SUBCUTANEOUS | Status: DC
  Administered 2013-09-17 – 2013-09-20 (×6): 25 [IU] via SUBCUTANEOUS
  Filled 2013-09-16 (×9): qty 0.25

## 2013-09-16 MED ORDER — ASPIRIN 325 MG PO TABS
325.0000 mg | ORAL_TABLET | Freq: Every day | ORAL | Status: DC
Start: 2013-09-17 — End: 2013-09-20
  Administered 2013-09-17 – 2013-09-20 (×3): 325 mg via ORAL
  Filled 2013-09-16 (×4): qty 1

## 2013-09-16 MED ORDER — SIMVASTATIN 20 MG PO TABS: 20.0000 mg | ORAL_TABLET | Freq: Every day | ORAL | Status: DC

## 2013-09-16 MED ORDER — RENA-VITE PO TABS
1.0000 | ORAL_TABLET | Freq: Every day | ORAL | Status: DC
  Administered 2013-09-17 – 2013-09-19 (×3): 1 via ORAL
  Filled 2013-09-16 (×4): qty 1

## 2013-09-16 MED ORDER — HEPARIN SODIUM (PORCINE) 5000 UNIT/ML IJ SOLN
5000.0000 [IU] | Freq: Three times a day (TID) | INTRAMUSCULAR | Status: DC
  Administered 2013-09-17 – 2013-09-20 (×10): 5000 [IU] via SUBCUTANEOUS
  Filled 2013-09-16 (×13): qty 1

## 2013-09-16 NOTE — ED Notes (Signed)
Pt to ED following dialysis treatment c/o hypotension and weakness. Pt reports he slid from wheelchair after being taken home from treatment when his leg was caught between the wheelchair and the transportation vehicle.  Pt is MWF dialysis; reports full treatment on Monday and full treatment today except the last 15 minutes due to hypotension. Pt reports increased generalized weakness. Pt is pale in color; reports watery diarrhea starting today

## 2013-09-16 NOTE — ED Notes (Signed)
Pt here via EMS , , pt had a syncopal episode and slid out of his w/c , per ems pt first  B/P was in the 60"s , pt recived 400 of fluids b/p came up to 110, pt did go to dialysis today

## 2013-09-16 NOTE — ED Notes (Signed)
Spoke to [place] on 6E about patient's blood pressures; 6E unable to accept patient due to hypotensive

## 2013-09-16 NOTE — Progress Notes (Signed)
68yo male c/o generalized weakness and watery stools, became hypotensive during HD today causing end of session early, concern for intra-abdominal infection, to begin IV ABX.  Will start ciprofloxacin 400mg  IV Q24H for ESRD and monitor CBC and Cx.  Vernard Gambles, PharmD, BCPS 09/16/2013 11:41 PM

## 2013-09-16 NOTE — H&P (Addendum)
Hospitalist Admission History and Physical  Patient name: [name] Medical record number: [id number] Date of birth: [birth year] Age: 68 y.o. Gender: male  Primary Care Provider: [name], MD  Chief Complaint: orthostasis, diarrhea   History of Present Illness:This is a 68 y.o. year old male with significant past medical history of ESRD w/ HD MWF, diastolic CHF, COPD, IDDM, CAD  presenting with orthostasis, diarrhea. Pt states that he has chronic hypotension assd with HD. Takes midodrine TID chronically. States that he went to dialysis today. Felt weak throughout course, however seemed to worsen in last 30-45 minutes. HD was stopped 15 min short of full session.  Per report, pt slid out of his wheelchair after dialysis because of weakness. Was subsequently sent to the ER for further evaluation. States that he did not take his midodrine today. Pt feels like adequate amount of fluid was removed during HD. Alos reports watery diarrhea and nausea over past 2-3 days. No abd pain. No fevers or chills.  In the ER, T 97.7, HR 90s-100s, Resp 10s-20s, BP 70s-100s, Satting > 93%on RA. WBC 10.8, hgb 11.9, Cr 4.9, Glu 178. EKG w. Rate @ 99. Noted fascicular block and RBBB. C diff and hemoccult negative.Pt feels diaphoretic upon attempting to stand in ER.  Received 500 cc bolus in ER.   Assessment and Plan: DIONNE ROSSA is a 68 y.o. year old male presenting with orthostasis, diarrhea   Active Problems:   Hypotension   Orthostasis   1-Orthostasis -Likely symptomatic hypotension in setting of relative volume depletion s/p HD today  -scheduled high dose midodrine -gentle hydration -renal consult pending  -repeat 2D ECHO  -cycle CEs given positional diaphoresis. -EKG and trop WNL on presentation  -no active CP/chest pressure   2-Diarrhea -Cipro and flagyl for GI coverage -noted mild leukocytosis-trend  -hemoccult and c diff negative on presentation  -stool studies including C diff,  stool culture, fecal lactoferrin, O and P -pt self reports multiple sick contacts with similar sxs-? Viral  -noted hx/o c diff in the past   3-CAD/CHF -as above  -cycle CEs -repeat 2D ECHO  -clinically dry on exam s/p HD  -fairly narrow volume window-noted concominant diarrhea  -tele bed  -continue home regimen  -cards c/s as clinically indicated   4-ESRD  -s/p HD today  -renal consult   5-IDDM  -SSI, A1C  6-Asthma  -stable  -cont home regimen   7-Anemia  -At baseline  -continue to follow  -currently not on epo in review of chart    FEN/GI: heart  Healthy, carb modified diet   Prophylaxis: sub q heparin  Disposition: pending further evaluation  Code Status:Full Code    Patient Active Problem List   Diagnosis Date Noted  . Orthostasis 09/16/2013  . Diastolic CHF 04/21/2013  . First degree AV block 04/17/2013  . RBBB 04/17/2013  . Aortic stenosis- mild to moderate with normal LVF 04/17/2013  . Wenckebach second degree AV block 04/16/2013  . Bacteremia due to Gram-positive bacteria 04/15/2013  . OSA (obstructive sleep apnea) 04/14/2013  . Obesity hypoventilation syndrome 04/14/2013  . Broken internal left hip prosthesis 04/14/2013  . Opioid overdose 04/13/2013  . Encephalopathy acute 04/13/2013  . Chronic pain 04/13/2013  . HTN (hypertension) 04/13/2013  . Acute and chronic respiratory failure 04/13/2013  . COPD (chronic obstructive pulmonary disease) 04/13/2013  . GERD (gastroesophageal reflux disease) 04/13/2013  . GI bleeding 08/30/2012  . Weakness generalized 05/07/2012  . CAD- non obstructive 2011 05/07/2012  .  Syncope 12/12/2011  . Hypotension 10/30/2011  . Pneumonia 10/30/2011  . ESRD (end stage renal disease) on dialysis 10/30/2011  . DM2 (diabetes mellitus, type 2) 10/30/2011  . Cardiomyopathy 10/30/2011  . Anemia 10/30/2011  . Blind    Past Medical History: Past Medical History  Diagnosis Date  . CHF (congestive heart failure)   . CAD  (coronary artery disease) 2006  . COPD (chronic obstructive pulmonary disease)   . Hyperlipidemia   . ESRD (end stage renal disease) on dialysis started 08/2006    Started HD in September 2008.  R arm AVG placed 2008 was ligated 2009 due to steal syndrome.  Gets HD in [place] on TTS schedule. ESRD was due to DM.    Marland Kitchen Type II diabetes mellitus     etiology of ESRD  . Diabetic blindness   . Hypertension   . MI, old 2006  . Asthma   . Pneumonia     "everytime I take a cold" (05/07/2012)  . Bronchiectasis   . GERD (gastroesophageal reflux disease)   . Stroke 2006    "numb on left side since"  05/07/2012  . On home oxygen therapy     "3L at night mostly; also prn" (05/07/2012)  . Cardiomegaly     Past Surgical History: Past Surgical History  Procedure Laterality Date  . Av fistula placement Right 09/2006    "used it once" (05/07/2012)  . Appendectomy  ~ 1958  . Insertion of dialysis catheter Left 09/2006    "chest" (05/07/2012)  . Total hip arthroplasty Left 1997  . Repair ankle ligament Left 1981  . Cataract extraction w/ intraocular lens implant Left ?2012    Social History: History   Social History  . Marital Status: Married    Spouse Name: N/A    Number of Children: N/A  . Years of Education: N/A   Social History Main Topics  . Smoking status: Former Smoker -- 2.00 packs/day for 30 years    Types: Cigarettes    Quit date: 01/02/1984  . Smokeless tobacco: Never Used  . Alcohol Use: Yes     Comment: 05/07/2012 "stopped drinking 1979"  . Drug Use: No  . Sexual Activity: None   Other Topics Concern  . None   Social History Narrative  . None    Family History: Family History  Problem Relation Age of Onset  . Diabetes Mellitus II Father     Allergies: Allergies  Allergen Reactions  . Cefepime Itching  . Penicillins Hives and Swelling    Current Facility-Administered Medications  Medication Dose Route Frequency Provider Last Rate Last Dose  . 0.9 %  sodium  chloride infusion   Intravenous Continuous Doree Albee, MD      . albuterol (PROVENTIL HFA;VENTOLIN HFA) 108 (90 BASE) MCG/ACT inhaler 1 puff  1 puff Inhalation Q6H PRN Doree Albee, MD      . Melene Muller ON 09/17/2013] aspirin tablet 325 mg  325 mg Oral Daily Doree Albee, MD      . brimonidine (ALPHAGAN) 0.2 % ophthalmic solution 1 drop  1 drop Both Eyes BID Doree Albee, MD      . Melene Muller ON 09/17/2013] calcium acetate (PHOSLO) capsule 3,335 mg  3,335 mg Oral TID WC Doree Albee, MD      . Melene Muller ON 09/17/2013] calcium-vitamin D (OSCAL WITH D) 500-200 MG-UNIT per tablet 1 tablet  1 tablet Oral Q breakfast Doree Albee, MD      . Melene Muller ON 09/17/2013] cinacalcet (SENSIPAR) tablet 60  mg  60 mg Oral Daily Doree Albee, MD      . Melene Muller ON 09/17/2013] clopidogrel (PLAVIX) tablet 75 mg  75 mg Oral Q breakfast Doree Albee, MD      . gabapentin (NEURONTIN) capsule 100 mg  100 mg Oral TID Doree Albee, MD      . heparin injection 5,000 Units  5,000 Units Subcutaneous 3 times per day Doree Albee, MD      . insulin glargine (LANTUS) injection 25 Units  25 Units Subcutaneous BID Doree Albee, MD      . metroNIDAZOLE (FLAGYL) IVPB 500 mg  500 mg Intravenous Q8H Doree Albee, MD      . Melene Muller ON 09/17/2013] midodrine (PROAMATINE) tablet 10 mg  10 mg Oral TID WC Doree Albee, MD      . multivitamin (RENA-VIT) tablet 1 tablet  1 tablet Oral QHS Doree Albee, MD      . Melene Muller ON 09/17/2013] pantoprazole (PROTONIX) EC tablet 40 mg  40 mg Oral Daily Doree Albee, MD      . rOPINIRole (REQUIP) tablet 0.5 mg  0.5 mg Oral QHS Doree Albee, MD      . Melene Muller ON 09/17/2013] simvastatin (ZOCOR) tablet 20 mg  20 mg Oral q1800 Doree Albee, MD      . sodium chloride 0.9 % injection 3 mL  3 mL Intravenous Q12H Doree Albee, MD       Current Outpatient Prescriptions  Medication Sig Dispense Refill  . aspirin 325 MG tablet Take 1 tablet (325 mg total) by mouth daily.      . brimonidine (ALPHAGAN) 0.2 % ophthalmic  solution Place 1 drop into both eyes 2 (two) times daily.      . calcium acetate (PHOSLO) 667 MG capsule Take 3,335 mg by mouth 3 (three) times daily with meals.      . calcium-vitamin D (OSCAL WITH D) 500-200 MG-UNIT per tablet Take 1 tablet by mouth daily with breakfast.       . cinacalcet (SENSIPAR) 60 MG tablet Take 60 mg by mouth daily.      . clopidogrel (PLAVIX) 75 MG tablet Take 75 mg by mouth daily with breakfast.      . diphenhydrAMINE (BENADRYL) 25 MG tablet Take 25 mg by mouth every 6 (six) hours as needed for itching or allergies.      Marland Kitchen gabapentin (NEURONTIN) 100 MG capsule Take 100 mg by mouth 3 (three) times daily.      . insulin aspart (NOVOLOG) 100 UNIT/ML injection Check CBG AC and HS CBG 121-150   1 unit CBG 151-200   2 units CBG 201-250   3 units CBG 251-300   5 units CBG 301-350   7 units CBG 351-400   9 units CBG > 400   CALL MD  10 mL  11  . insulin glargine (LANTUS) 100 UNIT/ML injection Inject 25 Units into the skin 2 (two) times daily.      Marland Kitchen loperamide (IMODIUM A-D) 2 MG tablet Take 2 mg by mouth 4 (four) times daily as needed for diarrhea or loose stools.      . metoprolol (LOPRESSOR) 50 MG tablet Take 1 tablet (50 mg total) by mouth 3 (three) times daily as needed (as needed for systolic BP > 180).      . midodrine (PROAMATINE) 5 MG tablet Take 1 tablet (5 mg total) by mouth 3 (three) times daily with meals.      . multivitamin (RENA-VIT) TABS tablet Take 1 tablet  by mouth at bedtime.    0  . omeprazole (PRILOSEC) 20 MG capsule Take 20 mg by mouth 2 (two) times daily.       . polyvinyl alcohol (LIQUIFILM TEARS) 1.4 % ophthalmic solution Place 1 drop into both eyes daily as needed for dry eyes.      . pravastatin (PRAVACHOL) 40 MG tablet Take 40 mg by mouth at bedtime.       Marland Kitchen rOPINIRole (REQUIP) 0.25 MG tablet Take 0.5 mg by mouth at bedtime. 1 tablet twice a day and 2 tablets at bedtime.      . sennosides-docusate sodium (SENOKOT-S) 8.6-50 MG tablet Take 1  tablet by mouth 2 (two) times daily as needed for constipation. For constipation      . albuterol (PROVENTIL HFA;VENTOLIN HFA) 108 (90 BASE) MCG/ACT inhaler Inhale into the lungs every 6 (six) hours as needed for wheezing or shortness of breath.      Marland Kitchen albuterol (PROVENTIL) (2.5 MG/3ML) 0.083% nebulizer solution Take 2.5 mg by nebulization every 6 (six) hours as needed for wheezing or shortness of breath.       Review Of Systems: 12 point ROS negative except as noted above in HPI.  Physical Exam: Filed Vitals:   09/16/13 2232  BP: 97/56  Pulse: 94  Temp:   Resp: 16    General: cooperative, morbidly obese and flushed HEENT: PERRLA and extra ocular movement intact Heart: S1, S2 normal, no murmur, rub or gallop, regular rate and rhythm Lungs: clear to auscultation, no wheezes or rales and unlabored breathing Abdomen: abdomen is soft without significant tenderness, masses, organomegaly or guarding Extremities: extremities normal, atraumatic, no cyanosis or edema Skin:no rashes, no ecchymoses Neurology: normal without focal findings  Labs and Imaging: Lab Results  Component Value Date/Time   NA 133* 09/16/2013  7:05 PM   K 4.5 09/16/2013  7:05 PM   CL 95* 09/16/2013  7:05 PM   CO2 22 09/16/2013  7:05 PM   BUN 21 09/16/2013  7:05 PM   CREATININE 4.91* 09/16/2013  7:05 PM   GLUCOSE 178* 09/16/2013  7:05 PM   Lab Results  Component Value Date   WBC 10.8* 09/16/2013   HGB 11.9* 09/16/2013   HCT 37.0* 09/16/2013   MCV 95.9 09/16/2013   PLT 214 09/16/2013    No results found.         Doree Albee MD  Pager: [phone]
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
## 2013-09-16 NOTE — ED Notes (Signed)
Phlebotomy at bedside; pt resting, NAD noted

## 2013-09-17 ENCOUNTER — Encounter (HOSPITAL_COMMUNITY): Payer: Self-pay | Admitting: Cardiology

## 2013-09-17 DIAGNOSIS — I359 Nonrheumatic aortic valve disorder, unspecified: Secondary | ICD-10-CM | POA: Diagnosis present

## 2013-09-17 DIAGNOSIS — N2581 Secondary hyperparathyroidism of renal origin: Secondary | ICD-10-CM | POA: Diagnosis present

## 2013-09-17 DIAGNOSIS — I951 Orthostatic hypotension: Secondary | ICD-10-CM | POA: Diagnosis present

## 2013-09-17 DIAGNOSIS — I4510 Unspecified right bundle-branch block: Secondary | ICD-10-CM | POA: Diagnosis present

## 2013-09-17 DIAGNOSIS — I498 Other specified cardiac arrhythmias: Secondary | ICD-10-CM | POA: Diagnosis present

## 2013-09-17 DIAGNOSIS — D72829 Elevated white blood cell count, unspecified: Secondary | ICD-10-CM | POA: Diagnosis present

## 2013-09-17 DIAGNOSIS — Z9981 Dependence on supplemental oxygen: Secondary | ICD-10-CM | POA: Diagnosis not present

## 2013-09-17 DIAGNOSIS — D649 Anemia, unspecified: Secondary | ICD-10-CM | POA: Diagnosis present

## 2013-09-17 DIAGNOSIS — I471 Supraventricular tachycardia: Secondary | ICD-10-CM | POA: Diagnosis not present

## 2013-09-17 DIAGNOSIS — R7989 Other specified abnormal findings of blood chemistry: Secondary | ICD-10-CM

## 2013-09-17 DIAGNOSIS — E861 Hypovolemia: Secondary | ICD-10-CM | POA: Diagnosis present

## 2013-09-17 DIAGNOSIS — I120 Hypertensive chronic kidney disease with stage 5 chronic kidney disease or end stage renal disease: Secondary | ICD-10-CM | POA: Diagnosis present

## 2013-09-17 DIAGNOSIS — I959 Hypotension, unspecified: Secondary | ICD-10-CM | POA: Diagnosis present

## 2013-09-17 DIAGNOSIS — E785 Hyperlipidemia, unspecified: Secondary | ICD-10-CM | POA: Diagnosis present

## 2013-09-17 DIAGNOSIS — H5440 Blindness, one eye, unspecified eye: Secondary | ICD-10-CM | POA: Diagnosis present

## 2013-09-17 DIAGNOSIS — I248 Other forms of acute ischemic heart disease: Secondary | ICD-10-CM | POA: Diagnosis present

## 2013-09-17 DIAGNOSIS — R55 Syncope and collapse: Secondary | ICD-10-CM

## 2013-09-17 DIAGNOSIS — I5030 Unspecified diastolic (congestive) heart failure: Secondary | ICD-10-CM | POA: Diagnosis present

## 2013-09-17 DIAGNOSIS — R778 Other specified abnormalities of plasma proteins: Secondary | ICD-10-CM | POA: Diagnosis not present

## 2013-09-17 DIAGNOSIS — I428 Other cardiomyopathies: Secondary | ICD-10-CM | POA: Diagnosis present

## 2013-09-17 DIAGNOSIS — A09 Infectious gastroenteritis and colitis, unspecified: Secondary | ICD-10-CM | POA: Diagnosis present

## 2013-09-17 DIAGNOSIS — I2510 Atherosclerotic heart disease of native coronary artery without angina pectoris: Secondary | ICD-10-CM | POA: Diagnosis present

## 2013-09-17 DIAGNOSIS — I509 Heart failure, unspecified: Secondary | ICD-10-CM | POA: Diagnosis present

## 2013-09-17 DIAGNOSIS — I2489 Other forms of acute ischemic heart disease: Secondary | ICD-10-CM | POA: Diagnosis present

## 2013-09-17 DIAGNOSIS — I9589 Other hypotension: Secondary | ICD-10-CM | POA: Diagnosis present

## 2013-09-17 DIAGNOSIS — G4733 Obstructive sleep apnea (adult) (pediatric): Secondary | ICD-10-CM | POA: Diagnosis present

## 2013-09-17 DIAGNOSIS — J449 Chronic obstructive pulmonary disease, unspecified: Secondary | ICD-10-CM | POA: Diagnosis present

## 2013-09-17 DIAGNOSIS — K219 Gastro-esophageal reflux disease without esophagitis: Secondary | ICD-10-CM | POA: Diagnosis present

## 2013-09-17 DIAGNOSIS — I252 Old myocardial infarction: Secondary | ICD-10-CM | POA: Diagnosis not present

## 2013-09-17 DIAGNOSIS — N186 End stage renal disease: Secondary | ICD-10-CM | POA: Diagnosis present

## 2013-09-17 LAB — TROPONIN I
TROPONIN I: 0.35 ng/mL — AB (ref <0.30)
TROPONIN I: 0.32 ng/mL — AB (ref <0.30)
Troponin I: 0.38 ng/mL (ref <0.30)

## 2013-09-17 LAB — CBC WITH DIFFERENTIAL/PLATELET
Basophils Absolute: 0 10*3/uL (ref 0.0–0.1)
Basophils Relative: 0 % (ref 0–1)
EOS ABS: 0.1 10*3/uL (ref 0.0–0.7)
EOS PCT: 1 % (ref 0–5)
HCT: 34.1 % — ABNORMAL LOW (ref 39.0–52.0)
Hemoglobin: 11.1 g/dL — ABNORMAL LOW (ref 13.0–17.0)
LYMPHS ABS: 1.9 10*3/uL (ref 0.7–4.0)
Lymphocytes Relative: 20 % (ref 12–46)
MCH: 31.4 pg (ref 26.0–34.0)
MCHC: 32.6 g/dL (ref 30.0–36.0)
MCV: 96.3 fL (ref 78.0–100.0)
Monocytes Absolute: 0.7 10*3/uL (ref 0.1–1.0)
Monocytes Relative: 8 % (ref 3–12)
Neutro Abs: 6.5 10*3/uL (ref 1.7–7.7)
Neutrophils Relative %: 71 % (ref 43–77)
PLATELETS: 178 10*3/uL (ref 150–400)
RBC: 3.54 MIL/uL — AB (ref 4.22–5.81)
RDW: 12.8 % (ref 11.5–15.5)
WBC: 9.1 10*3/uL (ref 4.0–10.5)

## 2013-09-17 LAB — COMPREHENSIVE METABOLIC PANEL
ALBUMIN: 2.5 g/dL — AB (ref 3.5–5.2)
ALT: 10 U/L (ref 0–53)
AST: 13 U/L (ref 0–37)
Alkaline Phosphatase: 120 U/L — ABNORMAL HIGH (ref 39–117)
Anion gap: 15 (ref 5–15)
BILIRUBIN TOTAL: 0.3 mg/dL (ref 0.3–1.2)
BUN: 26 mg/dL — ABNORMAL HIGH (ref 6–23)
CALCIUM: 8.9 mg/dL (ref 8.4–10.5)
CHLORIDE: 95 meq/L — AB (ref 96–112)
CO2: 23 mEq/L (ref 19–32)
CREATININE: 5.72 mg/dL — AB (ref 0.50–1.35)
GFR calc Af Amer: 11 mL/min — ABNORMAL LOW (ref >90)
GFR calc non Af Amer: 9 mL/min — ABNORMAL LOW (ref >90)
Glucose, Bld: 231 mg/dL — ABNORMAL HIGH (ref 70–99)
Potassium: 3.6 mEq/L — ABNORMAL LOW (ref 3.7–5.3)
Sodium: 133 mEq/L — ABNORMAL LOW (ref 137–147)
TOTAL PROTEIN: 5.5 g/dL — AB (ref 6.0–8.3)

## 2013-09-17 LAB — LIPASE, BLOOD: Lipase: 16 U/L (ref 11–59)

## 2013-09-17 LAB — I-STAT TROPONIN, ED: Troponin i, poc: 0.07 ng/mL (ref 0.00–0.08)

## 2013-09-17 LAB — GLUCOSE, CAPILLARY
GLUCOSE-CAPILLARY: 134 mg/dL — AB (ref 70–99)
Glucose-Capillary: 159 mg/dL — ABNORMAL HIGH (ref 70–99)
Glucose-Capillary: 193 mg/dL — ABNORMAL HIGH (ref 70–99)
Glucose-Capillary: 156 mg/dL — ABNORMAL HIGH (ref 70–99)
Glucose-Capillary: 108 mg/dL — ABNORMAL HIGH (ref 70–99)

## 2013-09-17 LAB — CK TOTAL AND CKMB (NOT AT ARMC)
CK TOTAL: 154 U/L (ref 7–232)
CK, MB: 6.8 ng/mL — AB (ref 0.3–4.0)
RELATIVE INDEX: 4.4 — AB (ref 0.0–2.5)

## 2013-09-17 LAB — MAGNESIUM: MAGNESIUM: 1.5 mg/dL (ref 1.5–2.5)

## 2013-09-17 LAB — MRSA PCR SCREENING: MRSA BY PCR: NEGATIVE

## 2013-09-17 LAB — AMYLASE: AMYLASE: 23 U/L (ref 0–105)

## 2013-09-17 LAB — PHOSPHORUS: Phosphorus: 6.2 mg/dL — ABNORMAL HIGH (ref 2.3–4.6)

## 2013-09-17 LAB — AMMONIA: Ammonia: 38 umol/L (ref 11–60)

## 2013-09-17 LAB — CLOSTRIDIUM DIFFICILE BY PCR: Toxigenic C. Difficile by PCR: NEGATIVE

## 2013-09-17 MED ORDER — ROPINIROLE HCL 0.25 MG PO TABS
0.2500 mg | ORAL_TABLET | Freq: Two times a day (BID) | ORAL | Status: DC
  Administered 2013-09-17 – 2013-09-20 (×6): 0.25 mg via ORAL
  Filled 2013-09-17 (×10): qty 1

## 2013-09-17 MED ORDER — ALBUTEROL SULFATE (2.5 MG/3ML) 0.083% IN NEBU: 2.5000 mg | INHALATION_SOLUTION | Freq: Four times a day (QID) | RESPIRATORY_TRACT | Status: DC | PRN

## 2013-09-17 MED ORDER — CETYLPYRIDINIUM CHLORIDE 0.05 % MT LIQD
7.0000 mL | Freq: Two times a day (BID) | OROMUCOSAL | Status: DC
  Administered 2013-09-17 – 2013-09-20 (×6): 7 mL via OROMUCOSAL

## 2013-09-17 MED ORDER — INSULIN ASPART 100 UNIT/ML ~~LOC~~ SOLN
3.0000 [IU] | Freq: Three times a day (TID) | SUBCUTANEOUS | Status: DC
  Administered 2013-09-17 – 2013-09-20 (×5): 3 [IU] via SUBCUTANEOUS

## 2013-09-17 MED ORDER — SODIUM CHLORIDE 0.9 % IV BOLUS (SEPSIS)
250.0000 mL | Freq: Once | INTRAVENOUS | Status: AC
  Administered 2013-09-17: 250 mL via INTRAVENOUS

## 2013-09-17 MED ORDER — INSULIN ASPART 100 UNIT/ML ~~LOC~~ SOLN
0.0000 [IU] | Freq: Three times a day (TID) | SUBCUTANEOUS | Status: DC
  Administered 2013-09-17: 1 [IU] via SUBCUTANEOUS
  Administered 2013-09-17 – 2013-09-18 (×2): 2 [IU] via SUBCUTANEOUS
  Administered 2013-09-19: 3 [IU] via SUBCUTANEOUS

## 2013-09-17 MED ORDER — PRAVASTATIN SODIUM 40 MG PO TABS
40.0000 mg | ORAL_TABLET | Freq: Every day | ORAL | Status: DC
  Administered 2013-09-17 – 2013-09-20 (×4): 40 mg via ORAL
  Filled 2013-09-17 (×4): qty 1

## 2013-09-17 NOTE — Care Management (Signed)
This patient is an active patient with [facility]. °I will follow for discharge needs. ° °Thank you. °Mary Yonjof RN BSN °[facility] °[phone] °

## 2013-09-17 NOTE — Progress Notes (Signed)
Critical lab value received from lab.  Troponin level 0.38.  Dr Mahala Menghini paged and notified.

## 2013-09-17 NOTE — ED Notes (Signed)
Spoke to Dr.Newton regarding pt's bed assignment. Pt to be placed on stepdown for hypotension

## 2013-09-17 NOTE — ED Notes (Signed)
Spoke to Dr.Newton regarding patient's pain; no orders received at this time

## 2013-09-17 NOTE — Progress Notes (Signed)
Patient had a short run of SVT lasting less than one minute.  He appeared to be asymptomatic during this time.  His heart rate increased from 80's to the mid 180's and quickly recovered.  MD was paged and notified.  He gave orders for a 12-lead EKG to be performed and magnesium and phosphorous labs to be drawn. Patient is stable at this time. Will continue to monitor.

## 2013-09-17 NOTE — Progress Notes (Signed)
12-lead EKG is complete.

## 2013-09-17 NOTE — Progress Notes (Signed)
Critical lab values received.  CKMB 6.8, Troponin 0.35.  Cardiology PA paged and notified.  Patient is stable at this time.  Will continue to monitor.

## 2013-09-17 NOTE — Progress Notes (Signed)
[name] ATF:[id number] DOB: 01/18/1945 DOA: 09/16/2013 PCP: [name], MD  Brief narrative: 68 y/o ?, ESRD MWF, known chronic ortho stasis on Midodrine, h/o remote GIB-[perianal-09/2012];prior Cardiomyopathy with cath 2011-echo 4/15=EF and 55% c Mod AoS, prior Barrett's esophagus, Ty2 IDDM, Prior CVA, R eye blindness  Past medical history-As per Problem list Chart reviewed as below- reviewed  Consultants:  Nephrology  Procedures:  none  Antibiotics:  Ciprofloxacin 9/16  Flagyll 9/16   Subjective  Doing fair. States he had 2-3 days nausea vomiting Sick contacts both at [facility]  home health aides also has been sick Diarrhea watery nonbloody 2-3 episodes per day Able to eat no nausea no vomiting No fever No chills No chest pain   Objective    Interim History: Reviewed  Telemetry: Sinus rhythm   Objective: Filed Vitals:   09/17/13 0315 09/17/13 0415 09/17/13 0500 09/17/13 0515  BP: 92/45 96/40  94/46  Pulse: 74 69 71   Temp:   97.6 F (36.4 C)   TempSrc:   Oral   Resp: 13 19    Height:   6' (1.829 m)   Weight:   107.5 kg (236 lb 15.9 oz)   SpO2: 100% 100% 96%     Intake/Output Summary (Last 24 hours) at 09/17/13 2706 Last data filed at 09/16/13 2014  Gross per 24 hour  Intake   1000 ml  Output      0 ml  Net   1000 ml    Exam:  General: Alert pleasant oriented no apparent distress  Cardiovascular: S1-S2 no murmur rub or gallop Respiratory: Clinically clear Abdomen: Abdomen soft nontender nondistended Skin lower extremities nonswollen with some excoriation type rhythm left Neuro intact  Data Reviewed: Basic Metabolic Panel:  Recent Labs Lab 09/16/13 1905 09/17/13 0205  NA 133* 133*  K 4.5 3.6*  CL 95* 95*  CO2 22 23  GLUCOSE 178* 231*  BUN 21 26*  CREATININE 4.91* 5.72*  CALCIUM 9.0 8.9   Liver Function Tests:  Recent Labs Lab 09/17/13 0205  AST 13  ALT 10  ALKPHOS 120*  BILITOT 0.3  PROT 5.5*    ALBUMIN 2.5*   No results found for this basename: LIPASE, AMYLASE,  in the last 168 hours No results found for this basename: AMMONIA,  in the last 168 hours CBC:  Recent Labs Lab 09/16/13 1905 09/17/13 0025  WBC 10.8* 9.1  NEUTROABS 8.4* 6.5  HGB 11.9* 11.1*  HCT 37.0* 34.1*  MCV 95.9 96.3  PLT 214 178   Cardiac Enzymes:  Recent Labs Lab 09/17/13 0025  TROPONINI <0.30   BNP: No components found with this basename: POCBNP,  CBG: No results found for this basename: GLUCAP,  in the last 168 hours  Recent Results (from the past 240 hour(s))  MRSA PCR SCREENING     Status: None   Collection Time    09/17/13  5:43 AM      Result Value Ref Range Status   MRSA by PCR NEGATIVE  NEGATIVE Final   Comment:            The GeneXpert MRSA Assay (FDA     approved for NASAL specimens     only), is one component of a     comprehensive MRSA colonization     surveillance program. It is not     intended to diagnose MRSA     infection nor to guide or     monitor treatment for  MRSA infections.     Studies:              All Imaging reviewed and is as per above notation   Scheduled Meds: . aspirin  325 mg Oral Daily  . brimonidine  1 drop Both Eyes BID  . calcium acetate  3,335 mg Oral TID WC  . calcium-vitamin D  1 tablet Oral Q breakfast  . cinacalcet  60 mg Oral Q breakfast  . ciprofloxacin  400 mg Intravenous Q24H  . clopidogrel  75 mg Oral Q breakfast  . gabapentin  100 mg Oral TID  . heparin  5,000 Units Subcutaneous 3 times per day  . insulin glargine  25 Units Subcutaneous BID  . metronidazole  500 mg Intravenous Q8H  . midodrine  10 mg Oral TID WC  . multivitamin  1 tablet Oral QHS  . pantoprazole  40 mg Oral Daily  . pravastatin  40 mg Oral Daily  . rOPINIRole  0.25 mg Oral BID AC  . rOPINIRole  0.5 mg Oral QHS  . sodium chloride  3 mL Intravenous Q12H   Continuous Infusions: . sodium chloride 50 mL/hr at 09/17/13 0038      Assessment/Plan: 1. Infectious diarrhea-unclear etiology. Get C. difficile PCR; ova; lactoferrin. Agree with empiric Cipro Flagyl. Contact precautions. CBC plus differential a.m. Will discontinue Lomotil for right now but can reimplement if nonbloody diarrhea 2. ESRD MWF-nephrology to consult regarding need for dialysis. He has been signing off somewhat early from dialysis so may require dialysis in the morning.  Usually - 3 to 4 L at every session.  Repeat labs at dialysis in a.m. 3. Hypotension-constitutional but worsened by diarrhea state. Continue IV saline 50 cc per hour. Continue Midrin 10 mg 3 times a day. Might benefit from recalculation of end-diastolic weight 4. History cardiomyopathy with normal EF 04/2013-Echocardiogram. Not Currently Having Chest Pain-Reasonable to Cycle Troponins. Careful with Fluid Status.  He is not a candidate for beta blocker or ACE inhibitor at the state and hypotension. Continue Plavix 75 daily 5. 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 Continue gabapentin 100 mg 3 times a day. A1c is pending 6. Prior history of Barrett's esophagus, perianal bleed-currently stable at present time.  Hemoglobin is 11 no need for erythropoietin stimulating agent at present. Needs screening endoscopy at some point. 7. Moderate aortic stenosis-outpatient evaluation 8. Prior CVA-continue Plavix 75 and ASA 325 mg daily 9. Metabolic bone disease-continue Os-Cal 1 tablet every morning, Sensipar 3.335 g 3 times a day 10. Hyperlipidemia-continue Pravachol 40 daily 11. Restless leg syndrome-Ropinirole  Code Status: Full Family Communication: None at bedside Disposition Plan: Step down for today-transfer tele if stable in 1-2 day   Pleas Koch, MD  [facility] Pager [phone] 09/17/2013, 7:21 AM    LOS: 1 day
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
## 2013-09-17 NOTE — Progress Notes (Signed)
CRITICAL VALUE ALERT  Critical value received:  Troponin 0.38  Date of notification:  09/17/13  Time of notification:  1243  Critical value read back:Yes.    Nurse who received alert:  Nicole Cella, RN  MD notified (1st page):  Samtani  Time of first page:  1244  MD notified (2nd page):  Time of second page:  Responding MD:  Mahala Menghini  Time MD responded:  1255

## 2013-09-17 NOTE — Progress Notes (Signed)
Reason for Consult: abnormal troponin   Referring Physician: Dr. Verlon Au PCP: [name]., MD Primary Cardiologist:Dr. Liane Comber from last hospitalization, no follow up  [name] is an 68 y.o. male.    Chief Complaint: admitted 09/16/13 with orthostasis and diarrhea.   HPI: 68 y.o. year old male with significant past medical history of ESRD w/ HD MWF, diastolic CHF, COPD, IDDM, CAD presenting with orthostasis, diarrhea. Pt states that he has chronic hypotension associated with HD. Takes midodrine TID chronically. States that he went to dialysis yesterday. Felt weak throughout course, however seemed to worsen in last 30-45 minutes. HD was stopped 15 min short of full session. Per report, pt slid out of his wheelchair after dialysis because of weakness. Was subsequently sent to the ER for further evaluation. States that he did not take his midodrine today. Pt feels like adequate amount of fluid was removed during HD. Alos reports watery diarrhea and nausea over past 2-3 days. No abd pain. No fevers or chills. In the ER, T 97.7, HR 90s-100s, Resp 10s-20s, BP 70s-100s, Satting > 93%on RA. WBC 10.8, hgb 11.9, Cr 4.9, Glu 178. EKG w. Rate @ 99. Noted fascicular block and RBBB. C diff and hemoccult negative.  Initial troponin negative but now at 0.38. He did have short run of SVT lasting < 1 min. HR 180s  + troponin drawn about an hour after tachycardia.   EKG today with RBBB and Lt PFB, also Junctional rhythm --P wave may be back in the T wave Vs. 1st degree AV block of 484m at least.     The patient had a cath in 2011 for chest pain with findings of non-obstructive CAD in LAD territory, echo from 04/16/13 shows preserved LVEF, no WMA.  Mild to moderate AS -Valve area: 1.75cm^2(VTI). Valve area: 1.64cm^2 (Vmax).  In April we saw for intermittent Wenckebach but he had bacteremia at the time and was hypotensive no matter his HR- midodrine was initiated. Pt was discharged to SNF.       Past Medical History  Diagnosis Date  . CHF (congestive heart failure)   . CAD (coronary artery disease) 2006  . COPD (chronic obstructive pulmonary disease)   . Hyperlipidemia   . ESRD (end stage renal disease) on dialysis started 08/2006    Started HD in September 2008.  R arm AVG placed 2008 was ligated 2009 due to steal syndrome.  [place] ESRD was due to DM.    .Marland KitchenType II diabetes mellitus     etiology of ESRD  . Diabetic blindness   . Hypertension   . MI, old 2006  . Asthma   . Pneumonia     "everytime I take a cold" (05/07/2012)  . Bronchiectasis   . GERD (gastroesophageal reflux disease)   . Stroke 2006    "numb on left side since"  05/07/2012  . On home oxygen therapy     "3L at night mostly; also prn" (05/07/2012)  . Cardiomegaly     Past Surgical History  Procedure Laterality Date  . Av fistula placement Right 09/2006    "used it once" (05/07/2012)  . Appendectomy  ~ 1958  . Insertion of dialysis catheter Left 09/2006    "chest" (05/07/2012)  . Total hip arthroplasty Left 1997  . Repair ankle ligament Left 1981  . Cataract extraction w/ intraocular lens implant Left ?2012    Family History  Problem Relation  Age of Onset  . Diabetes Mellitus II Father    Social History:  reports that he quit smoking about 29 years ago. His smoking use included Cigarettes. He has a 60 pack-year smoking history. He has never used smokeless tobacco. He reports that he drinks alcohol. He reports that he does not use illicit drugs.  Allergies:  Allergies  Allergen Reactions  . Cefepime Itching  . Penicillins Hives and Swelling    Medications Prior to Admission  Medication Sig Dispense Refill  . aspirin 325 MG tablet Take 1 tablet (325 mg total) by mouth daily.      . brimonidine (ALPHAGAN) 0.2 % ophthalmic solution Place 1 drop into both eyes 2 (two) times daily.      . calcium acetate (PHOSLO) 667 MG capsule Take 3,335 mg by mouth 3 (three) times  daily with meals.      . calcium-vitamin D (OSCAL WITH D) 500-200 MG-UNIT per tablet Take 1 tablet by mouth daily with breakfast.       . cinacalcet (SENSIPAR) 60 MG tablet Take 60 mg by mouth daily.      . clopidogrel (PLAVIX) 75 MG tablet Take 75 mg by mouth daily with breakfast.      . diphenhydrAMINE (BENADRYL) 25 MG tablet Take 25 mg by mouth every 6 (six) hours as needed for itching or allergies.      Marland Kitchen gabapentin (NEURONTIN) 100 MG capsule Take 100 mg by mouth 3 (three) times daily.      . insulin aspart (NOVOLOG) 100 UNIT/ML injection Check CBG AC and HS CBG 121-150   1 unit CBG 151-200   2 units CBG 201-250   3 units CBG 251-300   5 units CBG 301-350   7 units CBG 351-400   9 units CBG > 400   CALL MD  10 mL  11  . insulin glargine (LANTUS) 100 UNIT/ML injection Inject 25 Units into the skin 2 (two) times daily.      Marland Kitchen loperamide (IMODIUM A-D) 2 MG tablet Take 2 mg by mouth 4 (four) times daily as needed for diarrhea or loose stools.      . metoprolol (LOPRESSOR) 50 MG tablet Take 1 tablet (50 mg total) by mouth 3 (three) times daily as needed (as needed for systolic BP > 132).      . midodrine (PROAMATINE) 5 MG tablet Take 1 tablet (5 mg total) by mouth 3 (three) times daily with meals.      . multivitamin (RENA-VIT) TABS tablet Take 1 tablet by mouth at bedtime.    0  . omeprazole (PRILOSEC) 20 MG capsule Take 20 mg by mouth 2 (two) times daily.       . polyvinyl alcohol (LIQUIFILM TEARS) 1.4 % ophthalmic solution Place 1 drop into both eyes daily as needed for dry eyes.      . pravastatin (PRAVACHOL) 40 MG tablet Take 40 mg by mouth at bedtime.       Marland Kitchen rOPINIRole (REQUIP) 0.25 MG tablet Take 0.5 mg by mouth at bedtime. 1 tablet twice a day and 2 tablets at bedtime.      . sennosides-docusate sodium (SENOKOT-S) 8.6-50 MG tablet Take 1 tablet by mouth 2 (two) times daily as needed for constipation. For constipation      . albuterol (PROVENTIL HFA;VENTOLIN HFA) 108 (90 BASE) MCG/ACT  inhaler Inhale into the lungs every 6 (six) hours as needed for wheezing or shortness of breath.      Marland Kitchen albuterol (PROVENTIL) (2.5  MG/3ML) 0.083% nebulizer solution Take 2.5 mg by nebulization every 6 (six) hours as needed for wheezing or shortness of breath.        Results for orders placed during the hospital encounter of 09/16/13 (from the past 48 hour(s))  CLOSTRIDIUM DIFFICILE BY PCR     Status: None   Collection Time    09/16/13  7:01 PM      Result Value Ref Range   C difficile by pcr NEGATIVE  NEGATIVE  CBC WITH DIFFERENTIAL     Status: Abnormal   Collection Time    09/16/13  7:05 PM      Result Value Ref Range   WBC 10.8 (*) 4.0 - 10.5 K/uL   RBC 3.86 (*) 4.22 - 5.81 MIL/uL   Hemoglobin 11.9 (*) 13.0 - 17.0 g/dL   HCT 37.0 (*) 39.0 - 52.0 %   MCV 95.9  78.0 - 100.0 fL   MCH 30.8  26.0 - 34.0 pg   MCHC 32.2  30.0 - 36.0 g/dL   RDW 12.7  11.5 - 15.5 %   Platelets 214  150 - 400 K/uL   Neutrophils Relative % 77  43 - 77 %   Neutro Abs 8.4 (*) 1.7 - 7.7 K/uL   Lymphocytes Relative 14  12 - 46 %   Lymphs Abs 1.5  0.7 - 4.0 K/uL   Monocytes Relative 8  3 - 12 %   Monocytes Absolute 0.8  0.1 - 1.0 K/uL   Eosinophils Relative 1  0 - 5 %   Eosinophils Absolute 0.1  0.0 - 0.7 K/uL   Basophils Relative 0  0 - 1 %   Basophils Absolute 0.0  0.0 - 0.1 K/uL  BASIC METABOLIC PANEL     Status: Abnormal   Collection Time    09/16/13  7:05 PM      Result Value Ref Range   Sodium 133 (*) 137 - 147 mEq/L   Potassium 4.5  3.7 - 5.3 mEq/L   Comment: HEMOLYSIS AT THIS LEVEL MAY AFFECT RESULT   Chloride 95 (*) 96 - 112 mEq/L   CO2 22  19 - 32 mEq/L   Glucose, Bld 178 (*) 70 - 99 mg/dL   BUN 21  6 - 23 mg/dL   Creatinine, Ser 4.91 (*) 0.50 - 1.35 mg/dL   Calcium 9.0  8.4 - 10.5 mg/dL   GFR calc non Af Amer 11 (*) >90 mL/min   GFR calc Af Amer 13 (*) >90 mL/min   Comment: (NOTE)     The eGFR has been calculated using the CKD EPI equation.     This calculation has not been validated in  all clinical situations.     eGFR's persistently <90 mL/min signify possible Chronic Kidney     Disease.   Anion gap 16 (*) 5 - 15  POC OCCULT BLOOD, ED     Status: None   Collection Time    09/16/13  7:10 PM      Result Value Ref Range   Fecal Occult Bld NEGATIVE  NEGATIVE  CBC WITH DIFFERENTIAL     Status: Abnormal   Collection Time    09/17/13 12:25 AM      Result Value Ref Range   WBC 9.1  4.0 - 10.5 K/uL   RBC 3.54 (*) 4.22 - 5.81 MIL/uL   Hemoglobin 11.1 (*) 13.0 - 17.0 g/dL   HCT 34.1 (*) 39.0 - 52.0 %   MCV 96.3  78.0 -  100.0 fL   MCH 31.4  26.0 - 34.0 pg   MCHC 32.6  30.0 - 36.0 g/dL   RDW 12.8  11.5 - 15.5 %   Platelets 178  150 - 400 K/uL   Neutrophils Relative % 71  43 - 77 %   Neutro Abs 6.5  1.7 - 7.7 K/uL   Lymphocytes Relative 20  12 - 46 %   Lymphs Abs 1.9  0.7 - 4.0 K/uL   Monocytes Relative 8  3 - 12 %   Monocytes Absolute 0.7  0.1 - 1.0 K/uL   Eosinophils Relative 1  0 - 5 %   Eosinophils Absolute 0.1  0.0 - 0.7 K/uL   Basophils Relative 0  0 - 1 %   Basophils Absolute 0.0  0.0 - 0.1 K/uL  TROPONIN I     Status: None   Collection Time    09/17/13 12:25 AM      Result Value Ref Range   Troponin I <0.30  <0.30 ng/mL   Comment:            Due to the release kinetics of cTnI,     a negative result within the first hours     of the onset of symptoms does not rule out     myocardial infarction with certainty.     If myocardial infarction is still suspected,     repeat the test at appropriate intervals.  Randolm Idol, ED     Status: None   Collection Time    09/17/13 12:49 AM      Result Value Ref Range   Troponin i, poc 0.07  0.00 - 0.08 ng/mL   Comment 3            Comment: Due to the release kinetics of cTnI,     a negative result within the first hours     of the onset of symptoms does not rule out     myocardial infarction with certainty.     If myocardial infarction is still suspected,     repeat the test at appropriate intervals.    COMPREHENSIVE METABOLIC PANEL     Status: Abnormal   Collection Time    09/17/13  2:05 AM      Result Value Ref Range   Sodium 133 (*) 137 - 147 mEq/L   Potassium 3.6 (*) 3.7 - 5.3 mEq/L   Comment: DELTA CHECK NOTED   Chloride 95 (*) 96 - 112 mEq/L   CO2 23  19 - 32 mEq/L   Glucose, Bld 231 (*) 70 - 99 mg/dL   BUN 26 (*) 6 - 23 mg/dL   Creatinine, Ser 5.72 (*) 0.50 - 1.35 mg/dL   Calcium 8.9  8.4 - 10.5 mg/dL   Total Protein 5.5 (*) 6.0 - 8.3 g/dL   Albumin 2.5 (*) 3.5 - 5.2 g/dL   AST 13  0 - 37 U/L   ALT 10  0 - 53 U/L   Alkaline Phosphatase 120 (*) 39 - 117 U/L   Total Bilirubin 0.3  0.3 - 1.2 mg/dL   GFR calc non Af Amer 9 (*) >90 mL/min   GFR calc Af Amer 11 (*) >90 mL/min   Comment: (NOTE)     The eGFR has been calculated using the CKD EPI equation.     This calculation has not been validated in all clinical situations.     eGFR's persistently <90 mL/min signify possible Chronic Kidney  Disease.   Anion gap 15  5 - 15  MRSA PCR SCREENING     Status: None   Collection Time    09/17/13  5:43 AM      Result Value Ref Range   MRSA by PCR NEGATIVE  NEGATIVE   Comment:            The GeneXpert MRSA Assay (FDA     approved for NASAL specimens     only), is one component of a     comprehensive MRSA colonization     surveillance program. It is not     intended to diagnose MRSA     infection nor to guide or     monitor treatment for     MRSA infections.  GLUCOSE, CAPILLARY     Status: Abnormal   Collection Time    09/17/13  8:03 AM      Result Value Ref Range   Glucose-Capillary 159 (*) 70 - 99 mg/dL   Comment 1 Documented in Chart    TROPONIN I     Status: Abnormal   Collection Time    09/17/13 11:33 AM      Result Value Ref Range   Troponin I 0.38 (*) <0.30 ng/mL   Comment:            Due to the release kinetics of cTnI,     a negative result within the first hours     of the onset of symptoms does not rule out     myocardial infarction with certainty.      If myocardial infarction is still suspected,     repeat the test at appropriate intervals.     CRITICAL RESULT CALLED TO, READ BACK BY AND VERIFIED WITH:     S SHAW,RN AT 1239 09/17/13 BY K BARR  MAGNESIUM     Status: None   Collection Time    09/17/13 11:33 AM      Result Value Ref Range   Magnesium 1.5  1.5 - 2.5 mg/dL  PHOSPHORUS     Status: Abnormal   Collection Time    09/17/13 11:33 AM      Result Value Ref Range   Phosphorus 6.2 (*) 2.3 - 4.6 mg/dL  GLUCOSE, CAPILLARY     Status: Abnormal   Collection Time    09/17/13 12:12 PM      Result Value Ref Range   Glucose-Capillary 193 (*) 70 - 99 mg/dL   Comment 1 Documented in Chart    GLUCOSE, CAPILLARY     Status: Abnormal   Collection Time    09/17/13  2:15 PM      Result Value Ref Range   Glucose-Capillary 156 (*) 70 - 99 mg/dL   Comment 1 Documented in Chart     No results found.  ROS: unable to get information, pt sleepy and will with much prodding tell me he is awake but not answering questions except to complain of abd pain.  Per ROS by other providers General:no colds or fevers, no weight changes Skin:no rashes or ulcers HEENT:no blurred vision, no congestion CV:see HPI- no chest pain PUL:see HPI occ dyspnea at rest GI:+ diarrhea on admit. No constipation or melena, no indigestion GU:no hematuria, no dysuria MS:no joint pain, no claudication Neuro:no syncope, no lightheadedness Endo:+ diabetes ID, no thyroid disease   Blood pressure 107/53, pulse 82, temperature 98.3 F (36.8 C), temperature source Oral, resp. rate 15, height 6' (1.829 m), weight 236 lb 15.9  oz (107.5 kg), SpO2 100.00%. PE: General:Pleasant affect, but lethargic, though wakes to pain and tells me he is awake Skin:Warm and dry, brisk capillary refill HEENT:normocephalic, sclera clear, mucus membranes moist Neck:supple, no JVD, no bruits  Heart:S1S2 RRR without murmur, gallup, rub or click Lungs:clear ant, without rales, rhonchi, or  wheezes POE:UMPNT, soft, + tenderness with palpation, + BS, do not palpate liver spleen or masses Ext:no lower ext edema, 1+ pedal pulses, 2+ radial pulses Neuro:lethargic not answering question, MAE, not responding to commands except whit sternal rub, + facial symmetry    Assessment/Plan Principal Problem:   Orthostasis Active Problems:   Hypotension- BP in the 90s   DM2 (diabetes mellitus, type 2)   OSA (obstructive sleep apnea)   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  MD to decide anticoagulation, currently on VTE prophylaxis    SVT (supraventricular tachycardia)- no further episodes    Lethargic, with glucose of 150, spo2 at 100%, responds sluggishly with sternal rub, check ammonia level   Abd. Pain- recent complaints, none on admit. check lipase,   [id number]  Nurse Practitioner Certified CFranklinPager [phone] after 5pm or weekends call [phone] 09/17/2013, 2:24 PM    Patient seen and examined. Agree with assessment and plan. 68yo WM with ESRD on hemodialysis, DM, COPD, and mild nonobstructive CAD at cath 2011. He has had issues with orthostatic hypotension treated with midodrin. He had developed recent nausea and diarrhea. Dialysis was shortened yesterday. He was admitted this am with hypotension with BP decreased to 77 systolic. He developed tachycardia with transient SVT with rate up to 159. Troponin is minimally elevated most likely due to demand ischemia rather that MI. CPK to be checked. No acute ST changes with RBBB. Pt has been hydrated. Last echo in April showed normal EF improved from 2011.  Suspect demand ischemia in etiology of mild troponin elevation. Will schedule for lexiscan study for ischemic  evaluation with his significant cardiovascular co-morbidities. Suspect OSA, consider evaluation as outpatient.    TTroy Sine MD, FE[facility]9/17/2015 2:51 PM

## 2013-09-17 NOTE — Progress Notes (Signed)
CRITICAL VALUE ALERT  Critical value received:  CKMB 6.8, Troponin 0.35  Date of notification:  09/17/13  Time of notification:  1716  Critical value read back:Yes.    Nurse who received alert:  Nicole Cella, RN  MD notified (1st page):  Cardiology PA  Time of first page:  1726  MD notified (2nd page):  Time of second page:  Responding MD:  Ann Maki, PA  Time MD responded:  [phone]

## 2013-09-18 ENCOUNTER — Inpatient Hospital Stay (HOSPITAL_COMMUNITY): Payer: Medicare Other

## 2013-09-18 DIAGNOSIS — I2510 Atherosclerotic heart disease of native coronary artery without angina pectoris: Secondary | ICD-10-CM

## 2013-09-18 DIAGNOSIS — R7989 Other specified abnormal findings of blood chemistry: Secondary | ICD-10-CM

## 2013-09-18 DIAGNOSIS — R079 Chest pain, unspecified: Secondary | ICD-10-CM

## 2013-09-18 DIAGNOSIS — I959 Hypotension, unspecified: Secondary | ICD-10-CM

## 2013-09-18 LAB — RENAL FUNCTION PANEL
Albumin: 2.7 g/dL — ABNORMAL LOW (ref 3.5–5.2)
Anion gap: 16 — ABNORMAL HIGH (ref 5–15)
BUN: 36 mg/dL — ABNORMAL HIGH (ref 6–23)
CALCIUM: 8.9 mg/dL (ref 8.4–10.5)
CO2: 23 meq/L (ref 19–32)
Chloride: 95 mEq/L — ABNORMAL LOW (ref 96–112)
Creatinine, Ser: 7.57 mg/dL — ABNORMAL HIGH (ref 0.50–1.35)
GFR calc Af Amer: 8 mL/min — ABNORMAL LOW (ref >90)
GFR, EST NON AFRICAN AMERICAN: 7 mL/min — AB (ref >90)
GLUCOSE: 109 mg/dL — AB (ref 70–99)
Phosphorus: 6.6 mg/dL — ABNORMAL HIGH (ref 2.3–4.6)
Potassium: 4.3 mEq/L (ref 3.7–5.3)
Sodium: 134 mEq/L — ABNORMAL LOW (ref 137–147)

## 2013-09-18 LAB — GLUCOSE, CAPILLARY
GLUCOSE-CAPILLARY: 190 mg/dL — AB (ref 70–99)
Glucose-Capillary: 113 mg/dL — ABNORMAL HIGH (ref 70–99)
Glucose-Capillary: 119 mg/dL — ABNORMAL HIGH (ref 70–99)
Glucose-Capillary: 118 mg/dL — ABNORMAL HIGH (ref 70–99)

## 2013-09-18 LAB — CBC
HCT: 35.5 % — ABNORMAL LOW (ref 39.0–52.0)
Hemoglobin: 11.4 g/dL — ABNORMAL LOW (ref 13.0–17.0)
MCH: 31.1 pg (ref 26.0–34.0)
MCHC: 32.1 g/dL (ref 30.0–36.0)
MCV: 97 fL (ref 78.0–100.0)
Platelets: 275 10*3/uL (ref 150–400)
RBC: 3.66 MIL/uL — AB (ref 4.22–5.81)
RDW: 12.8 % (ref 11.5–15.5)
WBC: 10.4 10*3/uL (ref 4.0–10.5)

## 2013-09-18 MED ORDER — REGADENOSON 0.4 MG/5ML IV SOLN
0.4000 mg | Freq: Once | INTRAVENOUS | Status: AC
  Administered 2013-09-18: 0.4 mg via INTRAVENOUS

## 2013-09-18 MED ORDER — ALTEPLASE 2 MG IJ SOLR
4.0000 mg | INTRAMUSCULAR | Status: AC
  Administered 2013-09-18: 4 mg
  Filled 2013-09-18: qty 4

## 2013-09-18 MED ORDER — LIDOCAINE HCL (PF) 1 % IJ SOLN: 5.0000 mL | INTRAMUSCULAR | Status: DC | PRN

## 2013-09-18 MED ORDER — ALTEPLASE 100 MG IV SOLR: 4.0000 mg | Freq: Once | INTRAVENOUS | Status: DC

## 2013-09-18 MED ORDER — TECHNETIUM TC 99M SESTAMIBI GENERIC - CARDIOLITE
30.0000 | Freq: Once | INTRAVENOUS | Status: AC | PRN
  Administered 2013-09-18: 30 via INTRAVENOUS

## 2013-09-18 MED ORDER — NEPRO/CARBSTEADY PO LIQD: 237.0000 mL | ORAL | Status: DC | PRN

## 2013-09-18 MED ORDER — LIDOCAINE-PRILOCAINE 2.5-2.5 % EX CREA: 1.0000 "application " | TOPICAL_CREAM | CUTANEOUS | Status: DC | PRN

## 2013-09-18 MED ORDER — ALTEPLASE 2 MG IJ SOLR: 2.0000 mg | Freq: Once | INTRAMUSCULAR | Status: DC | PRN

## 2013-09-18 MED ORDER — TECHNETIUM TC 99M SESTAMIBI GENERIC - CARDIOLITE
10.0000 | Freq: Once | INTRAVENOUS | Status: AC | PRN
  Administered 2013-09-18: 10 via INTRAVENOUS

## 2013-09-18 MED ORDER — REGADENOSON 0.4 MG/5ML IV SOLN
INTRAVENOUS | Status: AC
  Filled 2013-09-18: qty 5

## 2013-09-18 MED ORDER — HEPARIN SODIUM (PORCINE) 1000 UNIT/ML DIALYSIS: 8000.0000 [IU] | Freq: Once | INTRAMUSCULAR | Status: DC

## 2013-09-18 MED ORDER — SODIUM CHLORIDE 0.9 % IV SOLN: 100.0000 mL | INTRAVENOUS | Status: DC | PRN

## 2013-09-18 MED ORDER — REGADENOSON 0.4 MG/5ML IV SOLN: 0.4000 mg | Freq: Once | INTRAVENOUS | Status: DC

## 2013-09-18 MED ORDER — ALTEPLASE 100 MG IV SOLR
4.0000 mg | INTRAVENOUS | Status: AC
  Filled 2013-09-18: qty 4

## 2013-09-18 MED ORDER — PENTAFLUOROPROP-TETRAFLUOROETH EX AERO: 1.0000 "application " | INHALATION_SPRAY | CUTANEOUS | Status: DC | PRN

## 2013-09-18 MED ORDER — HEPARIN SODIUM (PORCINE) 1000 UNIT/ML DIALYSIS: 1000.0000 [IU] | INTRAMUSCULAR | Status: DC | PRN

## 2013-09-18 NOTE — Progress Notes (Signed)
Subjective:  Seen pre-dialysis, no complaints, no recent dizziness, no diarrhea overnight  Objective: Vital signs in last 24 hours: Temp:  [97.6 F (36.4 C)-99.6 F (37.6 C)] 97.6 F (36.4 C) (09/18 1144) Pulse Rate:  [58-131] 91 (09/18 1144) Resp:  [9-18] 14 (09/18 1144) BP: (83-156)/(34-83) 93/40 mmHg (09/18 1144) SpO2:  [86 %-100 %] 100 % (09/18 1144) Weight change:   Intake/Output from previous day: 09/17 0701 - 09/18 0700 In: 1564.3 [P.O.:240; I.V.:524.3; IV Piggyback:800] Out: -  Intake/Output this shift:   EXAM: General appearance:  Alert, in no apparent distress Resp:  CTA without rales, rhonchi, or wheezes Cardio:  RRR without murmurs or rub GI:  Obese, + BS, soft and nontender Extremities:  No edema Access:  L IJ catheter   Lab Results:  Recent Labs  09/17/13 0025 09/18/13 0535  WBC 9.1 10.4  HGB 11.1* 11.4*  HCT 34.1* 35.5*  PLT 178 275   BMET:  Recent Labs  09/17/13 0205 09/18/13 0240  NA 133* 134*  K 3.6* 4.3  CL 95* 95*  CO2 23 23  GLUCOSE 231* 109*  BUN 26* 36*  CREATININE 5.72* 7.57*  CALCIUM 8.9 8.9  ALBUMIN 2.5* 2.7*   No results found for this basename: PTH,  in the last 72 hours Iron Studies: No results found for this basename: IRON, TIBC, TRANSFERRIN, FERRITIN,  in the last 72 hours  Studies/Results: No results found.  Dialysis Orders: MWF [facility]  4hr 15 min 107kg 2K/2Ca 8000 Heparin L IJ cath 450/1.5  Aranesp 25 q 4 week Venofer 50 q week hectorol 2  Profile 4  Assessment/Plan: 1. Hypotension - likely hypovolemic sec to diarrhea & HD, missed Midodrine dose, much better. 2. Diarrhea - chronic, C diff negative, on Cipro & Flagyl. 3. ESRD - HD on MWF @ [facility], K 4.3.  HD today. 4. Hypotension/Volume - BP 93/40, Midodrine 10 mg tid; no signs or symptoms of fluid overload, keep even during HD today. 5. Anemia - Hgb 11.4, Aranesp q4wks. 6. Sec HPT - Ca 8.9 (9.9 corrected), P 6.6; Sensipar 60 mg qd, Phoslo 4 with meals. 7. Nutrition -  Alb 2.7, renal diet, multivitamin. 8. DM - per primary.  LOS: 2 days   LYLES,CHARLES 09/18/2013,1:43 PM   I have seen and examined this patient and agree with plan as outlined by C. Lyles, PA-C.  Unfortunately his HD TDC would not run even after activase was instilled.  Will instill overnight and try again first shift. Ondine Gemme A,MD 09/18/2013 4:18 PM

## 2013-09-18 NOTE — Progress Notes (Signed)
Upon arrival at HD pt arterial port of catheter would not push or pull. Cathflo instilled for an hour. Arterial port of catheter would still not pull or push.  Called MD.  Instructed to let Cathflo dwell overnight and bring patient in for tx first shift tomorrow.

## 2013-09-18 NOTE — Progress Notes (Signed)
[name] WJX:[id number] DOB: 1945/04/16 DOA: 09/16/2013 PCP: [name], MD  Brief narrative: 68 y/o ?, ESRD MWF, known chronic ortho stasis on Midodrine, h/o remote GIB-[perianal-09/2012];prior Cardiomyopathy with cath 2011-echo 4/15=EF and 55% c Mod AoS, prior Barrett's esophagus, Ty2 IDDM, Prior CVA, R eye blindness admitted 09/16/13 with orthostasis diarrhea weakness and sent to the emergency room Patient noncompliant with midordine as OP Cardiac enzymes cycled and noted to be positive that they demand ischemia cardiology consulted nuclear stress test performed 9/70  Past medical history-As per Problem list Chart reviewed as below- reviewed  Consultants:  Nephrology  Procedures:  none  Antibiotics:  Ciprofloxacin 9/16  Flagyll 9/16   Subjective  Doing okay Hungry as has not had a meal since stress test No further dizzy episode. No cp or sob currently   Objective    Interim History: Reviewed  Telemetry: Sinus rhythm   Objective: Filed Vitals:   09/18/13 0948 09/18/13 0949 09/18/13 0951 09/18/13 0953  BP:  116/53 116/45 114/47  Pulse:      Temp:      TempSrc:      Resp: 13 13    Height:      Weight:      SpO2:        Intake/Output Summary (Last 24 hours) at 09/18/13 1028 Last data filed at 09/18/13 0016  Gross per 24 hour  Intake    853 ml  Output      0 ml  Net    853 ml    Exam:  General: Alert pleasant oriented no apparent distress  Cardiovascular: S1-S2 no murmur rub or gallop Respiratory: Clinically clear Abdomen: Abdomen soft nontender nondistended Skin lower extremities nonswollen with some excoriation LE left Neuro intact  Data Reviewed: Basic Metabolic Panel:  Recent Labs Lab 09/16/13 1905 09/17/13 0205 09/17/13 1133 09/18/13 0240  NA 133* 133*  --  134*  K 4.5 3.6*  --  4.3  CL 95* 95*  --  95*  CO2 22 23  --  23  GLUCOSE 178* 231*  --  109*  BUN 21 26*  --  36*  CREATININE 4.91* 5.72*  --  7.57*  CALCIUM  9.0 8.9  --  8.9  MG  --   --  1.5  --   PHOS  --   --  6.2* 6.6*   Liver Function Tests:  Recent Labs Lab 09/17/13 0205 09/18/13 0240  AST 13  --   ALT 10  --   ALKPHOS 120*  --   BILITOT 0.3  --   PROT 5.5*  --   ALBUMIN 2.5* 2.7*    Recent Labs Lab 09/17/13 1630  LIPASE 16  AMYLASE 23    Recent Labs Lab 09/17/13 1630  AMMONIA 38   CBC:  Recent Labs Lab 09/16/13 1905 09/17/13 0025 09/18/13 0535  WBC 10.8* 9.1 10.4  NEUTROABS 8.4* 6.5  --   HGB 11.9* 11.1* 11.4*  HCT 37.0* 34.1* 35.5*  MCV 95.9 96.3 97.0  PLT 214 178 275   Cardiac Enzymes:  Recent Labs Lab 09/17/13 0025 09/17/13 1133 09/17/13 1630 09/17/13 2224  CKTOTAL  --   --  154  --   CKMB  --   --  6.8*  --   TROPONINI <0.30 0.38* 0.35* 0.32*   BNP: No components found with this basename: POCBNP,  CBG:  Recent Labs Lab 09/17/13 1212 09/17/13 1415 09/17/13 1639 09/17/13 2144 09/18/13 0730  GLUCAP 193* 156* 134*  108* 113*    Recent Results (from the past 240 hour(s))  CLOSTRIDIUM DIFFICILE BY PCR     Status: None   Collection Time    09/16/13  7:01 PM      Result Value Ref Range Status   C difficile by pcr NEGATIVE  NEGATIVE Final  MRSA PCR SCREENING     Status: None   Collection Time    09/17/13  5:43 AM      Result Value Ref Range Status   MRSA by PCR NEGATIVE  NEGATIVE Final   Comment:            The GeneXpert MRSA Assay (FDA     approved for NASAL specimens     only), is one component of a     comprehensive MRSA colonization     surveillance program. It is not     intended to diagnose MRSA     infection nor to guide or     monitor treatment for     MRSA infections.     Studies:              All Imaging reviewed and is as per above notation   Scheduled Meds: . antiseptic oral rinse  7 mL Mouth Rinse BID  . aspirin  325 mg Oral Daily  . brimonidine  1 drop Both Eyes BID  . calcium acetate  3,335 mg Oral TID WC  . calcium-vitamin D  1 tablet Oral Q breakfast    . cinacalcet  60 mg Oral Q breakfast  . ciprofloxacin  400 mg Intravenous Q24H  . clopidogrel  75 mg Oral Q breakfast  . gabapentin  100 mg Oral TID  . heparin  5,000 Units Subcutaneous 3 times per day  . heparin  8,000 Units Dialysis Once in dialysis  . insulin aspart  0-9 Units Subcutaneous TID WC  . insulin aspart  3 Units Subcutaneous TID WC  . insulin glargine  25 Units Subcutaneous BID  . metronidazole  500 mg Intravenous Q8H  . midodrine  10 mg Oral TID WC  . multivitamin  1 tablet Oral QHS  . pantoprazole  40 mg Oral Daily  . pravastatin  40 mg Oral Daily  . rOPINIRole  0.25 mg Oral BID AC  . rOPINIRole  0.5 mg Oral QHS  . sodium chloride  3 mL Intravenous Q12H   Continuous Infusions:     Assessment/Plan:  1. Infectious diarrhea-unclear etiology.  5 days history.  neg C. difficile PCR; pending are ova + lactoferrin. Agree with empiric Cipro Flagyl. Contact precautions. CBC plus differential a.m. Would narrow in next 24 hours to oral augmentin-complete 7 days po. 2. Type II MI with demand ischemia-cardiology consulted 9/17-nuclear stress test pending-cardiology input appreicated 3. ESRD MWF-nephrology input appreciated. For UF only  at dialysis.  Nephrology to comment on further plans for dialysis 4. Hypotension-constitutional but worsened by diarrhea state.  Patient also non-compliant with midodrine thoguth supposed to be on Midrin 10 mg 3 times a day. Saline lock today 5. History cardiomyopathy with normal EF 04/2013-Echocardiogram.   He is not a candidate for beta blocker or ACE inhibitor at the state and hypotension. Continue Plavix 75 daily 6. 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  continue sliding scale. Blood sugars 100's-150's Continue gabapentin 100 mg 3 times a day.  7. Prior history of Barrett's esophagus, perianal bleed-currently stable at present time.  Hemoglobin is 11 no need for  erythropoietin stimulating agent at present. Needs screening endoscopy at some point. 8. Moderate aortic stenosis-outpatient evaluation  9. Prior CVA-continue Plavix 75 and ASA 325 mg daily 10. Metabolic bone disease-continue Os-Cal 1 tablet every morning, Sensipar 3.335 g 3 times a day 11. Hyperlipidemia-continue Pravachol 40 daily  12. Restless leg syndrome-Ropinirole  Code Status: Full Family Communication: None at bedside-? # fr HCPOA Campa,[name] Spouse [phone] Disposition Plan: transfer tele today   Pleas Koch, MD  [facility] Pager [phone] 09/18/2013, 10:28 AM    LOS: 2 days

## 2013-09-18 NOTE — Progress Notes (Signed)
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 He has had issues with orthostatic hypotension treated with midodrin. He had developed recent nausea and diarrhea. He was admitted 09/17/13 for hypotension with BP decreased to 77 systolic. He developed tachycardia with transient SVT with rate up to 159. Troponin is minimally elevated.   Subjective: No complaints. Pt noted mild chest discomfort during NST. No complaints overnight.   Objective: Vital signs in last 24 hours: Temp:  [97.8 F (36.6 C)-99.6 F (37.6 C)] 97.8 F (36.6 C) (09/18 0719) Pulse Rate:  [58-131] 59 (09/18 0719) Resp:  [9-18] 13 (09/18 0949) BP: (83-156)/(34-83) 114/47 mmHg (09/18 0953) SpO2:  [86 %-100 %] 100 % (09/18 0719) Last BM Date: 09/17/13  Intake/Output from previous day: 09/17 0701 - 09/18 0700 In: 1564.3 [P.O.:240; I.V.:524.3; IV Piggyback:800] Out: -  Intake/Output this shift:    Medications Current Facility-Administered Medications  Medication Dose Route Frequency Provider Last Rate Last Dose  . 0.9 %  sodium chloride infusion  100 mL Intravenous PRN Derrill Kay, NP      . 0.9 %  sodium chloride infusion  100 mL Intravenous PRN Derrill Kay, NP      . albuterol (PROVENTIL) (2.5 MG/3ML) 0.083% nebulizer solution 2.5 mg  2.5 mg Nebulization Q6H PRN Doree Albee, MD      . alteplase (CATHFLO ACTIVASE) injection 2 mg  2 mg Intracatheter Once PRN Derrill Kay, NP      . antiseptic oral rinse (CPC / CETYLPYRIDINIUM CHLORIDE 0.05%) solution 7 mL  7 mL Mouth Rinse BID Rhetta Mura, MD   7 mL at 09/17/13 2247  . aspirin tablet 325 mg  325 mg Oral Daily Doree Albee, MD   325 mg at 09/17/13 1009  . brimonidine (ALPHAGAN) 0.2 % ophthalmic solution 1 drop  1 drop Both Eyes BID Doree Albee, MD   1 drop at 09/17/13 2248  . calcium acetate (PHOSLO) capsule 3,335 mg  3,335 mg Oral TID WC Doree Albee, MD   3,335 mg at 09/17/13  1635  . calcium-vitamin D (OSCAL WITH D) 500-200 MG-UNIT per tablet 1 tablet  1 tablet Oral Q breakfast Doree Albee, MD   1 tablet at 09/17/13 0816  . cinacalcet (SENSIPAR) tablet 60 mg  60 mg Oral Q breakfast Doree Albee, MD   60 mg at 09/17/13 0816  . ciprofloxacin (CIPRO) IVPB 400 mg  400 mg Intravenous Q24H Colleen Can, RPH 200 mL/hr at 09/18/13 0016 400 mg at 09/18/13 0016  . clopidogrel (PLAVIX) tablet 75 mg  75 mg Oral Q breakfast Doree Albee, MD   75 mg at 09/17/13 1009  . feeding supplement (NEPRO CARB STEADY) liquid 237 mL  237 mL Oral PRN Derrill Kay, NP      . gabapentin (NEURONTIN) capsule 100 mg  100 mg Oral TID Doree Albee, MD   100 mg at 09/17/13 2255  . heparin injection 1,000 Units  1,000 Units Dialysis PRN Derrill Kay, NP      . heparin injection 5,000 Units  5,000 Units Subcutaneous 3 times per day Doree Albee, MD   5,000 Units at 09/18/13 [phone]  . heparin injection 8,000 Units  8,000 Units Dialysis Once in dialysis Derrill Kay, NP      . insulin aspart (novoLOG) injection 0-9 Units  0-9 Units Subcutaneous TID WC Rhetta Mura, MD   1 Units at 09/17/13 1646  . insulin aspart (novoLOG)  injection 3 Units  3 Units Subcutaneous TID WC Rhetta Mura, MD   3 Units at 09/17/13 1646  . insulin glargine (LANTUS) injection 25 Units  25 Units Subcutaneous BID Doree Albee, MD   25 Units at 09/17/13 2252  . lidocaine (PF) (XYLOCAINE) 1 % injection 5 mL  5 mL Intradermal PRN Derrill Kay, NP      . lidocaine-prilocaine (EMLA) cream 1 application  1 application Topical PRN Derrill Kay, NP      . metroNIDAZOLE (FLAGYL) IVPB 500 mg  500 mg Intravenous Q8H Doree Albee, MD 100 mL/hr at 09/18/13 0012 500 mg at 09/18/13 0012  . midodrine (PROAMATINE) tablet 10 mg  10 mg Oral TID WC Doree Albee, MD   10 mg at 09/17/13 1636  . multivitamin (RENA-VIT) tablet 1 tablet  1 tablet Oral QHS Doree Albee, MD   1 tablet at 09/17/13  2249  . pantoprazole (PROTONIX) EC tablet 40 mg  40 mg Oral Daily Doree Albee, MD   40 mg at 09/17/13 1009  . pentafluoroprop-tetrafluoroeth (GEBAUERS) aerosol 1 application  1 application Topical PRN Derrill Kay, NP      . pravastatin (PRAVACHOL) tablet 40 mg  40 mg Oral Daily Doree Albee, MD   40 mg at 09/17/13 1009  . rOPINIRole (REQUIP) tablet 0.25 mg  0.25 mg Oral BID AC Doree Albee, MD   0.25 mg at 09/17/13 1636  . rOPINIRole (REQUIP) tablet 0.5 mg  0.5 mg Oral QHS Doree Albee, MD   0.5 mg at 09/17/13 2250  . sodium chloride 0.9 % injection 3 mL  3 mL Intravenous Q12H Doree Albee, MD   3 mL at 09/17/13 2250    PE: General appearance: alert, cooperative, no distress, morbidly obese and legally blind Neck: no carotid bruit and no JVD Lungs: clear to auscultation bilaterally Heart: regularly irregular rhythm and 1/6 SM Extremities: no LEE Pulses: 2+ and symmetric Skin: warm and dry Neurologic: Grossly normal  Lab Results:   Recent Labs  09/16/13 1905 09/17/13 0025 09/18/13 0535  WBC 10.8* 9.1 10.4  HGB 11.9* 11.1* 11.4*  HCT 37.0* 34.1* 35.5*  PLT 214 178 275   BMET  Recent Labs  09/16/13 1905 09/17/13 0205 09/18/13 0240  NA 133* 133* 134*  K 4.5 3.6* 4.3  CL 95* 95* 95*  CO2 GLUCOSE 178* 231* 109*  BUN 21 26* 36*  CREATININE 4.91* 5.72* 7.57*  CALCIUM 9.0 8.9 8.9   Cardiac Panel (last 3 results)  Recent Labs  09/17/13 1133 09/17/13 1630 09/17/13 2224  CKTOTAL  --  154  --   CKMB  --  6.8*  --   TROPONINI 0.38* 0.35* 0.32*  RELINDX  --  4.4*  --     Studies/Results:  Lexiscan NST pending  Assessment/Plan  Principal Problem:   Orthostasis Active Problems:   Hypotension   DM2 (diabetes mellitus, type 2)   OSA (obstructive sleep apnea)   RBBB   Elevated troponin I level   SVT (supraventricular tachycardia)  1. Elevated troponin: NST results pending. PVCs noted during stress portion. Radiologist interpretation to  follow. Elevated troponin portends worse prognosis.   2. Hypotension: BP stable.   3. SVT: currently NSR with rate in the 90s.     LOS: 2 days    Brittainy M. Sharol Harness, PA-C 09/18/2013 10:08 AM  Personally seen and examined. Agree with above. Donato Schultz, MD

## 2013-09-19 LAB — RENAL FUNCTION PANEL
Albumin: 2.8 g/dL — ABNORMAL LOW (ref 3.5–5.2)
Anion gap: 20 — ABNORMAL HIGH (ref 5–15)
BUN: 39 mg/dL — ABNORMAL HIGH (ref 6–23)
CALCIUM: 9.1 mg/dL (ref 8.4–10.5)
CO2: 17 meq/L — AB (ref 19–32)
CREATININE: 8.84 mg/dL — AB (ref 0.50–1.35)
Chloride: 97 mEq/L (ref 96–112)
GFR calc Af Amer: 6 mL/min — ABNORMAL LOW (ref >90)
GFR calc non Af Amer: 5 mL/min — ABNORMAL LOW (ref >90)
Glucose, Bld: 117 mg/dL — ABNORMAL HIGH (ref 70–99)
Phosphorus: 7 mg/dL — ABNORMAL HIGH (ref 2.3–4.6)
Potassium: 4.6 mEq/L (ref 3.7–5.3)
Sodium: 134 mEq/L — ABNORMAL LOW (ref 137–147)

## 2013-09-19 LAB — CBC WITH DIFFERENTIAL/PLATELET
Basophils Absolute: 0.1 10*3/uL (ref 0.0–0.1)
Basophils Relative: 1 % (ref 0–1)
EOS PCT: 3 % (ref 0–5)
Eosinophils Absolute: 0.3 10*3/uL (ref 0.0–0.7)
HEMATOCRIT: 34.8 % — AB (ref 39.0–52.0)
Hemoglobin: 11.3 g/dL — ABNORMAL LOW (ref 13.0–17.0)
LYMPHS ABS: 2.4 10*3/uL (ref 0.7–4.0)
LYMPHS PCT: 22 % (ref 12–46)
MCH: 31.3 pg (ref 26.0–34.0)
MCHC: 32.5 g/dL (ref 30.0–36.0)
MCV: 96.4 fL (ref 78.0–100.0)
MONO ABS: 1.1 10*3/uL — AB (ref 0.1–1.0)
Monocytes Relative: 11 % (ref 3–12)
Neutro Abs: 7 10*3/uL (ref 1.7–7.7)
Neutrophils Relative %: 63 % (ref 43–77)
Platelets: 257 10*3/uL (ref 150–400)
RBC: 3.61 MIL/uL — AB (ref 4.22–5.81)
RDW: 12.7 % (ref 11.5–15.5)
WBC: 10.9 10*3/uL — AB (ref 4.0–10.5)

## 2013-09-19 LAB — GLUCOSE, CAPILLARY
Glucose-Capillary: 169 mg/dL — ABNORMAL HIGH (ref 70–99)
Glucose-Capillary: 209 mg/dL — ABNORMAL HIGH (ref 70–99)
Glucose-Capillary: 149 mg/dL — ABNORMAL HIGH (ref 70–99)

## 2013-09-19 MED ORDER — CIPROFLOXACIN HCL 500 MG PO TABS
500.0000 mg | ORAL_TABLET | ORAL | Status: DC
  Administered 2013-09-19 – 2013-09-20 (×2): 500 mg via ORAL
  Filled 2013-09-19 (×2): qty 1

## 2013-09-19 MED ORDER — METRONIDAZOLE 500 MG PO TABS
500.0000 mg | ORAL_TABLET | Freq: Three times a day (TID) | ORAL | Status: DC
  Administered 2013-09-19 – 2013-09-20 (×3): 500 mg via ORAL
  Filled 2013-09-19 (×6): qty 1

## 2013-09-19 NOTE — Evaluation (Signed)
Physical Therapy Evaluation Patient Details Name: [name] MRN: [id number] DOB: 1945/10/29 Today's Date: 09/19/2013   History of Present Illness  68 y/o, ESRD MWF, known chronic ortho stasis on Midodrine, h/o remote GIB-perianal-09/2012;prior Cardiomyopathy, IDDM, Prior CVA, R eye blindness admitted 09/16/13 with orthostasis, diarrhea, and weakness  Clinical Impression  Pt admitted with above. Pt currently with functional limitations due to the deficits listed below (see PT Problem List).  Pt will benefit from skilled PT to increase their independence and safety with mobility to allow discharge to the venue listed below. Currently pt refusing ST-SNF. If he continues to refuse SNF would recommend maximal home health support as well as 24 hour assist at home.      Follow Up Recommendations SNF (pt currently refusing this option)    Equipment Recommendations  None recommended by PT    Recommendations for Other Services       Precautions / Restrictions Precautions Precautions: Fall      Mobility  Bed Mobility Overal bed mobility: Needs Assistance Bed Mobility: Supine to Sit;Sit to Supine     Supine to sit: +2 for physical assistance;Mod assist Sit to supine: +2 for physical assistance;Min assist   General bed mobility comments: Assist to bring trunk up and hips to EOB into sitting. Assist to lower trunk down and to bring legs up into bed when returning to supine.  Transfers Overall transfer level: Needs assistance Equipment used: Rolling walker (2 wheeled) Transfers: Sit to/from Stand Sit to Stand: +2 physical assistance;Mod assist;From elevated surface         General transfer comment: Assist to bring hip and trunk up. Pt stood with walker x 2 for 30-60 secs with knees hyperextended.  Ambulation/Gait                Stairs            Wheelchair Mobility    Modified Rankin (Stroke Patients Only)       Balance Overall balance assessment: Needs  assistance Sitting-balance support: No upper extremity supported;Feet supported Sitting balance-Leahy Scale: Fair     Standing balance support: Bilateral upper extremity supported Standing balance-Leahy Scale: Poor Standing balance comment: Requires assist and walker to maintain standing.                             Pertinent Vitals/Pain Pain Assessment: No/denies pain    Home Living Family/patient expects to be discharged to:: Private residence Living Arrangements: Spouse/significant other;Children Available Help at Discharge: Family;Available 24 hours/day Type of Home: House Home Access: Ramped entrance     Home Layout: One level Home Equipment: Walker - 2 wheels;Bedside commode;Wheelchair - IT trainer      Prior Function Level of Independence: Needs assistance   Gait / Transfers Assistance Needed: Pt reports he requires assist to amb very short distances and that he can perform transfers on his own  ADL's / Homemaking Assistance Needed: pt reports assist with bathing/dressing        Hand Dominance   Dominant Hand: Right    Extremity/Trunk Assessment   Upper Extremity Assessment: Generalized weakness           Lower Extremity Assessment: RLE deficits/detail;LLE deficits/detail RLE Deficits / Details: grossly 3-/5 LLE Deficits / Details: grossly 3-/5     Communication   Communication: No difficulties  Cognition Arousal/Alertness: Awake/alert Behavior During Therapy: WFL for tasks assessed/performed Overall Cognitive Status: Within Functional Limits for tasks assessed  General Comments      Exercises        Assessment/Plan    PT Assessment Patient needs continued PT services  PT Diagnosis Difficulty walking;Generalized weakness   PT Problem List Decreased strength;Decreased activity tolerance;Decreased balance;Decreased mobility;Decreased knowledge of use of DME;Decreased knowledge of  precautions;Obesity  PT Treatment Interventions DME instruction;Gait training;Functional mobility training;Therapeutic activities;Therapeutic exercise;Balance training;Patient/family education   PT Goals (Current goals can be found in the Care Plan section) Acute Rehab PT Goals Patient Stated Goal: go home PT Goal Formulation: With patient Time For Goal Achievement: 09/26/13 Potential to Achieve Goals: Good    Frequency Min 3X/week   Barriers to discharge        Co-evaluation               End of Session Equipment Utilized During Treatment: Gait belt Activity Tolerance: Patient limited by fatigue Patient left: in bed;with call bell/phone within reach (HD staff in room)           Time: 0034-9179 PT Time Calculation (min): 18 min   Charges:   PT Evaluation $Initial PT Evaluation Tier I: 1 Procedure PT Treatments $Gait Training: 8-22 mins   PT G Codes:          Juergen Hardenbrook 10-09-2013, 10:18 AM  [facility] PT [phone]

## 2013-09-19 NOTE — Progress Notes (Signed)
Subjective:  Seen on dialysis, no current complaints  Objective: Vital signs in last 24 hours: Temp:  [96.5 F (35.8 C)-98.2 F (36.8 C)] 97.8 F (36.6 C) (09/19 1049) Pulse Rate:  [48-87] 82 (09/19 1055) Resp:  [11-15] 15 (09/19 1055) BP: (103-155)/(54-86) 145/86 mmHg (09/19 1055) SpO2:  [84 %-100 %] 100 % (09/19 1055) Weight:  [97.6 kg (215 lb 2.7 oz)-107.6 kg (237 lb 3.4 oz)] 97.6 kg (215 lb 2.7 oz) (09/19 1049) Weight change:   Intake/Output from previous day: 09/18 0701 - 09/19 0700 In: 103 [I.V.:3; IV Piggyback:100] Out: -  Intake/Output this shift:   Lab Results:  Recent Labs  09/18/13 0535 09/19/13 0346  WBC 10.4 10.9*  HGB 11.4* 11.3*  HCT 35.5* 34.8*  PLT 275 257   BMET:  Recent Labs  09/18/13 0240 09/19/13 0346  NA 134* 134*  K 4.3 4.6  CL 95* 97  CO2 23 17*  GLUCOSE 109* 117*  BUN 36* 39*  CREATININE 7.57* 8.84*  CALCIUM 8.9 9.1  ALBUMIN 2.7* 2.8*   No results found for this basename: PTH,  in the last 72 hours Iron Studies: No results found for this basename: IRON, TIBC, TRANSFERRIN, FERRITIN,  in the last 72 hours  Studies/Results: Nm Myocar Multi W/spect W/wall Motion / Ef  09/18/2013   CLINICAL DATA:  Chest pain, CHF.  CAD.  EXAM: MYOCARDIAL IMAGING WITH SPECT (REST AND PHARMACOLOGIC-STRESS)  GATED LEFT VENTRICULAR WALL MOTION STUDY  LEFT VENTRICULAR EJECTION FRACTION  TECHNIQUE: Standard myocardial SPECT imaging was performed after resting intravenous injection of 10 mCi Tc-60m sestamibi. Subsequently, intravenous infusion of Lexiscan was performed under the supervision of the Cardiology staff. At peak effect of the drug, 30 mCi Tc-77m sestamibi was injected intravenously and standard myocardial SPECT imaging was performed. Quantitative gated imaging was also performed to evaluate left ventricular wall motion, and estimate left ventricular ejection fraction.  COMPARISON:  None.  FINDINGS: Perfusion: Decreased activity noted in the inferior wall on  rest images, improving on stress images compatible with diaphragmatic attenuation. No fixed or reversible defects to suggest ischemia or infarct  Wall Motion: Normal left ventricular wall motion. No left ventricular dilation.  Left Ventricular Ejection Fraction: 48 %  End diastolic volume 82 ml  End systolic volume 43 ml  IMPRESSION: 1. No reversible ischemia or infarction.  2. Normal left ventricular wall motion.  3. Left ventricular ejection fraction 48%  4. Intermediate-risk stress test findings* based on slightly decreased left ventricular ejection fraction.  *2012 Appropriate Use Criteria for Coronary Revascularization Focused Update: J Am Coll Cardiol. 2012;59(9):857-881. http://content.dementiazones.com.aspx?articleid=1201161   Electronically Signed   By: Charlett Nose M.D.   On: 09/18/2013 16:23   EXAM:  General appearance: Alert, in no apparent distress  Resp: CTA without rales, rhonchi, or wheezes  Cardio: RRR without murmurs or rub  GI: Obese, + BS, soft and nontender  Extremities: No edema  Access: L IJ catheter with BFR 350 cc/min  Dialysis Orders: MWF [facility]  4hr 15 min 107kg 2K/2Ca 8000 Heparin L IJ cath 450/1.5  Aranesp 25 q 4 week Venofer 50 q week hectorol 2  Profile 4  Assessment/Plan: 1. Hypotension - likely hypovolemic sec to diarrhea & HD, missed Midodrine dose, much better.  2. Diarrhea - chronic, C diff negative, on Cipro & Flagyl.  3. ESRD - HD on MWF @ [facility], K 4.6, unable to run yesterday sec to poorly functioning catheter. HD today.  4. Hypotension/Volume - BP 124/67, Midodrine 10 mg tid; no  signs or symptoms of fluid overload, attempt 1 L UF goal, if BP remains stable.  5. Anemia - Hgb 11.3, Aranesp q4wks.  6. Sec HPT - Ca 9.1 (10.1 corrected), P 7; Sensipar 60 mg qd, Phoslo 4 with meals.  7. Nutrition - Alb 2.8, renal diet, multivitamin.  8. DM - per primary.    LOS: 3 days   [name],[name] 09/19/2013,11:59 AM   I have seen and examined this patient and  agree with plan as outlined by C. Lyles, PA-C. Wyoma Genson A,MD 09/19/2013 1:30 PM

## 2013-09-19 NOTE — Progress Notes (Signed)
Patient Name: [name] Date of Encounter: 09/19/2013     Principal Problem:   Orthostasis Active Problems:   Hypotension   DM2 (diabetes mellitus, type 2)   OSA (obstructive sleep apnea)   RBBB   Elevated troponin I level   SVT (supraventricular tachycardia)    SUBJECTIVE  The patient denies any chest pain or dyspnea. Rhythm remains normal sinus rhythm. The nuclear stress test yesterday showed no reversible ischemia.  No infarction.  Left ventricular ejection fraction 48%.  No motion abnormalities CURRENT MEDS . alteplase  4 mg Intracatheter STAT  . antiseptic oral rinse  7 mL Mouth Rinse BID  . aspirin  325 mg Oral Daily  . brimonidine  1 drop Both Eyes BID  . calcium acetate  3,335 mg Oral TID WC  . calcium-vitamin D  1 tablet Oral Q breakfast  . cinacalcet  60 mg Oral Q breakfast  . ciprofloxacin  500 mg Oral Q24H  . clopidogrel  75 mg Oral Q breakfast  . gabapentin  100 mg Oral TID  . heparin  5,000 Units Subcutaneous 3 times per day  . insulin aspart  0-9 Units Subcutaneous TID WC  . insulin aspart  3 Units Subcutaneous TID WC  . insulin glargine  25 Units Subcutaneous BID  . metroNIDAZOLE  500 mg Oral 3 times per day  . midodrine  10 mg Oral TID WC  . multivitamin  1 tablet Oral QHS  . pantoprazole  40 mg Oral Daily  . pravastatin  40 mg Oral Daily  . rOPINIRole  0.25 mg Oral BID AC  . rOPINIRole  0.5 mg Oral QHS  . sodium chloride  3 mL Intravenous Q12H    OBJECTIVE  Filed Vitals:   09/19/13 0426 09/19/13 0500 09/19/13 0600 09/19/13 0756  BP: 106/62 143/70 112/73 133/73  Pulse: 65  48 85  Temp: 96.5 F (35.8 C)   98.1 F (36.7 C)  TempSrc: Axillary   Oral  Resp: Height:      Weight:      SpO2: 100%  84% 100%    Intake/Output Summary (Last 24 hours) at 09/19/13 1008 Last data filed at 09/18/13 2156  Gross per 24 hour  Intake    103 ml  Output      0 ml  Net    103 ml   Filed Weights   09/17/13 0500 09/18/13 1427    Weight: 236 lb 15.9 oz (107.5 kg) 237 lb 3.4 oz (107.6 kg)    PHYSICAL EXAM  General: Pleasant, NAD.  Legally blind Neuro: Alert and oriented X 3. Moves all extremities spontaneously. Psych: Normal affect. HEENT:  Normal  Neck: Supple without bruits or JVD. Lungs:  Resp regular and unlabored, CTA. Heart: RRR no s3, s4, or murmurs. Abdomen: Soft, non-tender, non-distended, BS + x 4.  Extremities: No clubbing, cyanosis or edema. DP/PT/Radials 2+ and equal bilaterally.  Accessory Clinical Findings  CBC  Recent Labs  09/17/13 0025 09/18/13 0535 09/19/13 0346  WBC 9.1 10.4 10.9*  NEUTROABS 6.5  --  7.0  HGB 11.1* 11.4* 11.3*  HCT 34.1* 35.5* 34.8*  MCV 96.3 97.0 96.4  PLT 178 275 257   Basic Metabolic Panel  Recent Labs  09/17/13 1133 09/18/13 0240 09/19/13 0346  NA  --  134* 134*  K  --  4.3 4.6  CL  --  95* 97  CO2  --  23 17*  GLUCOSE  --  109*  117*  BUN  --  36* 39*  CREATININE  --  7.57* 8.84*  CALCIUM  --  8.9 9.1  MG 1.5  --   --   PHOS 6.2* 6.6* 7.0*   Liver Function Tests  Recent Labs  09/17/13 0205 09/18/13 0240 09/19/13 0346  AST 13  --   --   ALT 10  --   --   ALKPHOS 120*  --   --   BILITOT 0.3  --   --   PROT 5.5*  --   --   ALBUMIN 2.5* 2.7* 2.8*    Recent Labs  09/17/13 1630  LIPASE 16  AMYLASE 23   Cardiac Enzymes  Recent Labs  09/17/13 1133 09/17/13 1630 09/17/13 2224  CKTOTAL  --  154  --   CKMB  --  6.8*  --   TROPONINI 0.38* 0.35* 0.32*   BNP No components found with this basename: POCBNP,  D-Dimer No results found for this basename: DDIMER,  in the last 72 hours Hemoglobin A1C No results found for this basename: HGBA1C,  in the last 72 hours Fasting Lipid Panel No results found for this basename: CHOL, HDL, LDLCALC, TRIG, CHOLHDL, LDLDIRECT,  in the last 72 hours Thyroid Function Tests No results found for this basename: TSH, T4TOTAL, FREET3, T3FREE, THYROIDAB,  in the last 72 hours  TELE  Normal sinus  rhythm  ECG   Radiology/Studies  Nm Myocar Multi W/spect W/wall Motion / Ef  09/18/2013   CLINICAL DATA:  Chest pain, CHF.  CAD.  EXAM: MYOCARDIAL IMAGING WITH SPECT (REST AND PHARMACOLOGIC-STRESS)  GATED LEFT VENTRICULAR WALL MOTION STUDY  LEFT VENTRICULAR EJECTION FRACTION  TECHNIQUE: Standard myocardial SPECT imaging was performed after resting intravenous injection of 10 mCi Tc-66m sestamibi. Subsequently, intravenous infusion of Lexiscan was performed under the supervision of the Cardiology staff. At peak effect of the drug, 30 mCi Tc-55m sestamibi was injected intravenously and standard myocardial SPECT imaging was performed. Quantitative gated imaging was also performed to evaluate left ventricular wall motion, and estimate left ventricular ejection fraction.  COMPARISON:  None.  FINDINGS: Perfusion: Decreased activity noted in the inferior wall on rest images, improving on stress images compatible with diaphragmatic attenuation. No fixed or reversible defects to suggest ischemia or infarct  Wall Motion: Normal left ventricular wall motion. No left ventricular dilation.  Left Ventricular Ejection Fraction: 48 %  End diastolic volume 82 ml  End systolic volume 43 ml  IMPRESSION: 1. No reversible ischemia or infarction.  2. Normal left ventricular wall motion.  3. Left ventricular ejection fraction 48%  4. Intermediate-risk stress test findings* based on slightly decreased left ventricular ejection fraction.  *2012 Appropriate Use Criteria for Coronary Revascularization Focused Update: J Am Coll Cardiol. 2012;59(9):857-881. http://content.dementiazones.com.aspx?articleid=1201161   Electronically Signed   By: Charlett Nose M.D.   On: 09/18/2013 16:23    ASSESSMENT AND PLAN 1. elevated troponin 2. orthostatic hypotension on midodrine 3. end-stage renal disease on hemodialysis 4. diabetes mellitus 5. COPD  Plan: No further cardiac tests this admission.  Continue current medical  regimen.  Signed, Cassell Clement MD

## 2013-09-19 NOTE — Procedures (Signed)
Patient was seen on dialysis and the procedure was supervised. BFR 350 [address] [facility] TDC BP is 138/74.  Patient appears to be tolerating treatment well

## 2013-09-19 NOTE — Progress Notes (Signed)
ANTIBIOTIC CONSULT NOTE - FOLLOW UP  Pharmacy Consult for Cipro Indication: Intra-abdominal infection  Allergies  Allergen Reactions  . Cefepime Itching  . Penicillins Hives and Swelling    Patient Measurements: Height: 6' (182.9 cm) Weight: 220 lb 10.9 oz (100.1 kg) IBW/kg (Calculated) : 77.6  Vital Signs: Temp: 98.5 F (36.9 C) (09/19 1455) Temp src: Oral (09/19 1455) BP: 129/78 mmHg (09/19 1455) Pulse Rate: 91 (09/19 1455) Intake/Output from previous day: 09/18 0701 - 09/19 0700 In: 103 [I.V.:3; IV Piggyback:100] Out: -  Intake/Output from this shift: Total I/O In: -  Out: 750 [Other:750]  Labs:  Recent Labs  09/17/13 0025 09/17/13 0205 09/18/13 0240 09/18/13 0535 09/19/13 0346  WBC 9.1  --   --  10.4 10.9*  HGB 11.1*  --   --  11.4* 11.3*  PLT 178  --   --  275 257  CREATININE  --  5.72* 7.57*  --  8.84*   Estimated Creatinine Clearance: 9.9 ml/min (by C-G formula based on Cr of 8.84). No results found for this basename: VANCOTROUGH, VANCOPEAK, VANCORANDOM, GENTTROUGH, GENTPEAK, GENTRANDOM, TOBRATROUGH, TOBRAPEAK, TOBRARND, AMIKACINPEAK, AMIKACINTROU, AMIKACIN,  in the last 72 hours   Microbiology: Recent Results (from the past 720 hour(s))  CULTURE, BLOOD (ROUTINE X 2)     Status: None   Collection Time    09/16/13  1:16 AM      Result Value Ref Range Status   Specimen Description BLOOD LEFT FOREARM   Final   Special Requests BOTTLES DRAWN AEROBIC AND ANAEROBIC 5CC   Final   Culture  Setup Time     Final   Value: 09/17/2013 09:09     Performed at [facility]   Culture     Final   Value:        BLOOD CULTURE RECEIVED NO GROWTH TO DATE CULTURE WILL BE HELD FOR 5 DAYS BEFORE ISSUING A FINAL NEGATIVE REPORT     Performed at [facility]   Report Status PENDING   Incomplete  STOOL CULTURE     Status: None   Collection Time    09/16/13  7:01 PM      Result Value Ref Range Status   Specimen Description STOOL   Final   Special Requests  Normal   Final   Culture     Final   Value: NO SUSPICIOUS COLONIES, CONTINUING TO HOLD     Performed at [facility]   Report Status PENDING   Incomplete  CLOSTRIDIUM DIFFICILE BY PCR     Status: None   Collection Time    09/16/13  7:01 PM      Result Value Ref Range Status   C difficile by pcr NEGATIVE  NEGATIVE Final  CULTURE, BLOOD (ROUTINE X 2)     Status: None   Collection Time    09/17/13 12:30 AM      Result Value Ref Range Status   Specimen Description BLOOD LEFT ANTECUBITAL   Final   Special Requests BOTTLES DRAWN AEROBIC AND ANAEROBIC EACH   Final   Culture  Setup Time     Final   Value: 09/17/2013 04:20     Performed at [facility]   Culture     Final   Value:        BLOOD CULTURE RECEIVED NO GROWTH TO DATE CULTURE WILL BE HELD FOR 5 DAYS BEFORE ISSUING A FINAL NEGATIVE REPORT     Performed at [facility]  Report Status PENDING   Incomplete  MRSA PCR SCREENING     Status: None   Collection Time    09/17/13  5:43 AM      Result Value Ref Range Status   MRSA by PCR NEGATIVE  NEGATIVE Final   Comment:            The GeneXpert MRSA Assay (FDA     approved for NASAL specimens     only), is one component of a     comprehensive MRSA colonization     surveillance program. It is not     intended to diagnose MRSA     infection nor to guide or     monitor treatment for     MRSA infections.    Anti-infectives   Start     Dose/Rate Route Frequency Ordered Stop   09/19/13 1000  ciprofloxacin (CIPRO) tablet 500 mg     500 mg Oral Every 24 hours 09/19/13 0846     09/19/13 1000  metroNIDAZOLE (FLAGYL) tablet 500 mg     500 mg Oral 3 times per day 09/19/13 0846     09/17/13 0000  ciprofloxacin (CIPRO) IVPB 400 mg  Status:  Discontinued     400 mg 200 mL/hr over 60 Minutes Intravenous Every 24 hours 09/16/13 2338 09/19/13 0846   09/16/13 2345  metroNIDAZOLE (FLAGYL) IVPB 500 mg  Status:  Discontinued    Comments:  Dialysis pt-renal dosing as  clinically indicated   500 mg 100 mL/hr over 60 Minutes Intravenous Every 8 hours 09/16/13 2334 09/19/13 0846      Assessment: 68 yo M with ESRD admitted on 09/16/2013 with generalized weakness and watery stools. Started on IV abx for suspected intra-abdominal infection. Now on day #4 IV abx transitioned today to PO.   9/16 Cipro >> 9/16 Flagyl >>  9/16 BCx X2>> ngtd 9/16 Cdiff>> neg 9/16 stool>>   Goal of Therapy:  Resolution of infection  Plan:  - Continue Cipro 500 mg PO q24h (adjusted appropriately for HD) - Continue Flagyl 500 mg PO q8h  - Monitor temp, WBC, C&S - F/u LOT  Margie Billet, PharmD Clinical Pharmacist - Resident Pager: [phone] Pharmacy: [phone] 09/19/2013 3:19 PM

## 2013-09-19 NOTE — Progress Notes (Signed)
[name] AVW:[id number] DOB: [birth year] DOA: 09/16/2013 PCP: [name], MD  Brief narrative: 68 y/o ?, ESRD MWF, known chronic ortho stasis on Midodrine, h/o remote GIB-[perianal-09/2012];prior Cardiomyopathy with cath 2011-echo 4/15=EF and 55% c Mod AoS, prior Barrett's esophagus, Ty2 IDDM, Prior CVA, R eye blindness admitted 09/16/13 with orthostasis diarrhea weakness and sent to the emergency room Patient noncompliant with midordine as OP Cardiac enzymes cycled and noted to be positive that they demand ischemia cardiology consulted nuclear stress test performed 9/17 was neg for acute ischemia  Past medical history-As per Problem list Chart reviewed as below- reviewed  Consultants:  Nephrology  Procedures:  none  Antibiotics:  Ciprofloxacin 9/16  Flagyll 9/16   Subjective  upset hasn't been able to eat On phone with family NO reported diarrea-some N last pm No cp/sob   Objective    Interim History: Reviewed  Telemetry: Sinus rhythm   Objective: Filed Vitals:   09/19/13 0426 09/19/13 0500 09/19/13 0600 09/19/13 0756  BP: 106/62 143/70 112/73 133/73  Pulse: 65  48 85  Temp: 96.5 F (35.8 C)   98.1 F (36.7 C)  TempSrc: Axillary   Oral  Resp: Height:      Weight:      SpO2: 100%  84% 100%    Intake/Output Summary (Last 24 hours) at 09/19/13 0843 Last data filed at 09/18/13 2156  Gross per 24 hour  Intake    103 ml  Output      0 ml  Net    103 ml    Exam:  General: Alert pleasant oriented no apparent distress  Cardiovascular: S1-S2 no murmur rub or gallop Respiratory: Clinically clear Abdomen: Abdomen soft nontender nondistended Skin lower extremities nonswollen with some excoriation LE left Neuro intact  Data Reviewed: Basic Metabolic Panel:  Recent Labs Lab 09/16/13 1905 09/17/13 0205 09/17/13 1133 09/18/13 0240 09/19/13 0346  NA 133* 133*  --  134* 134*  K 4.5 3.6*  --  4.3 4.6  CL 95* 95*  --  95* 97    CO2 22 23  --  23 17*  GLUCOSE 178* 231*  --  109* 117*  BUN 21 26*  --  36* 39*  CREATININE 4.91* 5.72*  --  7.57* 8.84*  CALCIUM 9.0 8.9  --  8.9 9.1  MG  --   --  1.5  --   --   PHOS  --   --  6.2* 6.6* 7.0*   Liver Function Tests:  Recent Labs Lab 09/17/13 0205 09/18/13 0240 09/19/13 0346  AST 13  --   --   ALT 10  --   --   ALKPHOS 120*  --   --   BILITOT 0.3  --   --   PROT 5.5*  --   --   ALBUMIN 2.5* 2.7* 2.8*    Recent Labs Lab 09/17/13 1630  LIPASE 16  AMYLASE 23    Recent Labs Lab 09/17/13 1630  AMMONIA 38   CBC:  Recent Labs Lab 09/16/13 1905 09/17/13 0025 09/18/13 0535 09/19/13 0346  WBC 10.8* 9.1 10.4 10.9*  NEUTROABS 8.4* 6.5  --  7.0  HGB 11.9* 11.1* 11.4* 11.3*  HCT 37.0* 34.1* 35.5* 34.8*  MCV 95.9 96.3 97.0 96.4  PLT 214 178 275 257   Cardiac Enzymes:  Recent Labs Lab 09/17/13 0025 09/17/13 1133 09/17/13 1630 09/17/13 2224  CKTOTAL  --   --  154  --  CKMB  --   --  6.8*  --   TROPONINI <0.30 0.38* 0.35* 0.32*   BNP: No components found with this basename: POCBNP,  CBG:  Recent Labs Lab 09/17/13 2144 09/18/13 0730 09/18/13 1139 09/18/13 1631 09/18/13 2202  GLUCAP 108* 113* 119* 190* 118*    Recent Results (from the past 240 hour(s))  CULTURE, BLOOD (ROUTINE X 2)     Status: None   Collection Time    09/16/13  1:16 AM      Result Value Ref Range Status   Specimen Description BLOOD LEFT FOREARM   Final   Special Requests BOTTLES DRAWN AEROBIC AND ANAEROBIC 5CC   Final   Culture  Setup Time     Final   Value: 09/17/2013 09:09     Performed at [facility]   Culture     Final   Value:        BLOOD CULTURE RECEIVED NO GROWTH TO DATE CULTURE WILL BE HELD FOR 5 DAYS BEFORE ISSUING A FINAL NEGATIVE REPORT     Performed at [facility]   Report Status PENDING   Incomplete  STOOL CULTURE     Status: None   Collection Time    09/16/13  7:01 PM      Result Value Ref Range Status   Specimen  Description STOOL   Final   Special Requests Normal   Final   Culture     Final   Value: NO SUSPICIOUS COLONIES, CONTINUING TO HOLD     Performed at [facility]   Report Status PENDING   Incomplete  CLOSTRIDIUM DIFFICILE BY PCR     Status: None   Collection Time    09/16/13  7:01 PM      Result Value Ref Range Status   C difficile by pcr NEGATIVE  NEGATIVE Final  CULTURE, BLOOD (ROUTINE X 2)     Status: None   Collection Time    09/17/13 12:30 AM      Result Value Ref Range Status   Specimen Description BLOOD LEFT ANTECUBITAL   Final   Special Requests BOTTLES DRAWN AEROBIC AND ANAEROBIC EACH   Final   Culture  Setup Time     Final   Value: 09/17/2013 04:20     Performed at [facility]   Culture     Final   Value:        BLOOD CULTURE RECEIVED NO GROWTH TO DATE CULTURE WILL BE HELD FOR 5 DAYS BEFORE ISSUING A FINAL NEGATIVE REPORT     Performed at [facility]   Report Status PENDING   Incomplete  MRSA PCR SCREENING     Status: None   Collection Time    09/17/13  5:43 AM      Result Value Ref Range Status   MRSA by PCR NEGATIVE  NEGATIVE Final   Comment:            The GeneXpert MRSA Assay (FDA     approved for NASAL specimens     only), is one component of a     comprehensive MRSA colonization     surveillance program. It is not     intended to diagnose MRSA     infection nor to guide or     monitor treatment for     MRSA infections.     Studies:              All Imaging reviewed and  is as per above notation   Scheduled Meds: . alteplase  4 mg Intracatheter STAT  . antiseptic oral rinse  7 mL Mouth Rinse BID  . aspirin  325 mg Oral Daily  . brimonidine  1 drop Both Eyes BID  . calcium acetate  3,335 mg Oral TID WC  . calcium-vitamin D  1 tablet Oral Q breakfast  . cinacalcet  60 mg Oral Q breakfast  . ciprofloxacin  400 mg Intravenous Q24H  . clopidogrel  75 mg Oral Q breakfast  . gabapentin  100 mg Oral TID  . heparin  5,000 Units  Subcutaneous 3 times per day  . insulin aspart  0-9 Units Subcutaneous TID WC  . insulin aspart  3 Units Subcutaneous TID WC  . insulin glargine  25 Units Subcutaneous BID  . metronidazole  500 mg Intravenous Q8H  . midodrine  10 mg Oral TID WC  . multivitamin  1 tablet Oral QHS  . pantoprazole  40 mg Oral Daily  . pravastatin  40 mg Oral Daily  . rOPINIRole  0.25 mg Oral BID AC  . rOPINIRole  0.5 mg Oral QHS  . sodium chloride  3 mL Intravenous Q12H   Continuous Infusions:     Assessment/Plan:  1. Infectious diarrhea-unclear etiology.  5 days history.  neg C. difficile PCR; pending are ova + lactoferrin. changed IV-PO Cipro Flagyl. Contact precautions. CBC plus differential a.m. To complete 7 days po. 2. Type II MI with demand ischemia-cardiology consulted 9/17-nuclear stress neg 3. ESRD MWF-nephrology input appreciated. For UF only  at dialysis.  Nephrology to comment on further plans for dialysis-catheter clotted so attempting again dialysis 9/19 4. Hypotension-constitutional but worsened by diarrhea state.  Patient also non-compliant with midodrine though supposed to be on Midodrin 10 mg 3 times a day. Pressures imporved 5. History cardiomyopathy with normal EF 04/2013-Echocardiogram.   He is not a candidate for beta blocker or ACE inhibitor at the state and hypotension. Continue Plavix 75 daily 6. 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  continue sliding scale. Blood sugars 115-170s Continue gabapentin 100 mg 3 times a day.  7. Prior history of Barrett's esophagus, perianal bleed-currently stable at present time.  Hemoglobin is 11 no need for erythropoietin stimulating agent at present. Needs screening endoscopy at some point. 8. Moderate aortic stenosis-outpatient evaluation  9. Prior CVA-continue Plavix 75 and ASA 325 mg daily 10. Metabolic bone disease-continue Os-Cal 1 tablet every morning, Sensipar  3.335 g 3 times a day 11. Hyperlipidemia-continue Pravachol 40 daily  12. Restless leg syndrome-Ropinirole  Code Status: Full Family Communication: None at bedside Disposition Plan: transfer tele mayne later today with plan for d/ home 1-2 days   Pleas Koch, MD  [facility] Pager [phone] 09/19/2013, 8:43 AM    LOS: 3 days

## 2013-09-20 DIAGNOSIS — I953 Hypotension of hemodialysis: Secondary | ICD-10-CM

## 2013-09-20 LAB — CBC WITH DIFFERENTIAL/PLATELET
BASOS PCT: 1 % (ref 0–1)
Basophils Absolute: 0.1 10*3/uL (ref 0.0–0.1)
EOS PCT: 3 % (ref 0–5)
Eosinophils Absolute: 0.2 10*3/uL (ref 0.0–0.7)
HEMATOCRIT: 34.3 % — AB (ref 39.0–52.0)
HEMOGLOBIN: 11.3 g/dL — AB (ref 13.0–17.0)
LYMPHS PCT: 21 % (ref 12–46)
Lymphs Abs: 1.6 10*3/uL (ref 0.7–4.0)
MCH: 31.3 pg (ref 26.0–34.0)
MCHC: 32.9 g/dL (ref 30.0–36.0)
MCV: 95 fL (ref 78.0–100.0)
MONOS PCT: 10 % (ref 3–12)
Monocytes Absolute: 0.8 10*3/uL (ref 0.1–1.0)
NEUTROS ABS: 5.1 10*3/uL (ref 1.7–7.7)
NEUTROS PCT: 65 % (ref 43–77)
Platelets: ADEQUATE 10*3/uL (ref 150–400)
RBC: 3.61 MIL/uL — AB (ref 4.22–5.81)
RDW: 12.7 % (ref 11.5–15.5)
WBC: 7.8 10*3/uL (ref 4.0–10.5)

## 2013-09-20 LAB — GLUCOSE, CAPILLARY: GLUCOSE-CAPILLARY: 94 mg/dL (ref 70–99)

## 2013-09-20 LAB — STOOL CULTURE: Special Requests: NORMAL

## 2013-09-20 MED ORDER — METRONIDAZOLE 500 MG PO TABS: 500.0000 mg | ORAL_TABLET | Freq: Three times a day (TID) | ORAL | Status: DC

## 2013-09-20 MED ORDER — MIDODRINE HCL 10 MG PO TABS: 10.0000 mg | ORAL_TABLET | Freq: Three times a day (TID) | ORAL | Status: DC

## 2013-09-20 MED ORDER — CIPROFLOXACIN HCL 500 MG PO TABS: 500.0000 mg | ORAL_TABLET | ORAL | Status: DC

## 2013-09-20 NOTE — Discharge Summary (Signed)
Physician Discharge Summary  BLAIDEN WERTH ZOX:[id number] DOB: [birth year] DOA: 09/16/2013  PCP: [name], MD  Admit date: 09/16/2013 Discharge date: 09/20/2013  Time spent: 35 minutes  Recommendations for Outpatient Follow-up:  1. Continue Cipor and flagyl until 09/26/2013 2. Patient encouraged to continue use of Midodrine-this may need to be adjusted downwards as patient is hypertensive on discharge 3. Patient will need lab at dialysis on Monday in [place] 4. Diarrhea seems to have resolved-follow stool culture  5. Needs screening endoscopy at some point for Barrett esophagus  Discharge Diagnoses:  Principal Problem:   Orthostasis Active Problems:   Hypotension   DM2 (diabetes mellitus, type 2)   OSA (obstructive sleep apnea)   RBBB   Elevated troponin I level   SVT (supraventricular tachycardia)   Discharge Condition: Fair  Diet recommendation: Renal diabetic heart healthy  Filed Weights   09/18/13 1427 09/19/13 1049 09/19/13 1455  Weight: 107.6 kg (237 lb 3.4 oz) 97.6 kg (215 lb 2.7 oz) 100.1 kg (220 lb 10.9 oz)    History of present illness:  68 y/o ?, ESRD MWF, known chronic ortho stasis on Midodrine, h/o remote GIB-[perianal-09/2012];prior Cardiomyopathy with cath 2011-echo 4/15=EF and 55% c Mod AoS, prior Barrett's esophagus, Ty2 IDDM, Prior CVA, R eye blindness admitted 09/16/13 with orthostasis diarrhea weakness and sent to the emergency room  Patient noncompliant with midordine as OP  Cardiac enzymes cycled and noted to be positive that they demand ischemia cardiology consulted nuclear stress test performed 9/17 was neg for acute ischemia Her diarrhea subsequently got better see below   Hospital Course:  1. Infectious diarrhea-unclear etiology. 5 days history. neg C. difficile PCR; pending are ova + lactoferrin. Stool culture showed no suspicious colonies changed IV-PO Cipro Flagyl which will be completed on 9/26 which would be a 10 day course. At this stage I  do not think needs contact cautions 2. Type II MI with demand ischemia-cardiology consulted 9/17-nuclear stress neg for ischemia  3. ESRD MWF-nephrology input appreciated. For UF only at dialysis. Nephrology to comment on further plans for dialysis-catheter clotted so attempting again dialysis 9/19-catheter issues as well as dialysis to be addressed by nephrology however stable from my standpoint for discharge and followup at [facility] 09/21/48 4. Hypotension-constitutional but worsened by diarrhea state. Patient also non-compliant with midodrine though supposed to be on Midodrin 10 mg 3 times a day. Pressures improved to the hypertensive range so may need to adjust Midrin 5 mg dosing 5. History cardiomyopathy with normal EF 04/2013-Echocardiogram. He is not a candidate for beta blocker or ACE inhibitor at present time although this can be reconsidered as an outpatient. Continue Plavix 75 daily 6. 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 continue sliding scale. Blood sugars  controlled during hospital stay.   Continue gabapentin 100 mg 3 times a day.  7. Prior history of Barrett's esophagus, perianal bleed-currently stable at present time. Hemoglobin is 11 no need for erythropoietin stimulating agent at present. Needs screening endoscopy at some point. 8. Moderate aortic stenosis-outpatient evaluation  9. Prior CVA-continue Plavix 75 and ASA 325 mg daily 10. Metabolic bone disease-continue Os-Cal 1 tablet every morning, Sensipar 3.335 g 3 times a day 11. Hyperlipidemia-continue Pravachol 40 daily  12. Restless leg syndrome-Ropinirole  Consultants:  Nephrology Procedures:  none Antibiotics:  Ciprofloxacin 9/16-9/26  Flagyll 9/16-9/26   Discharge Exam: Filed Vitals:   09/20/13 0353  BP: 155/76  Pulse: 82  Temp: 97.3 F (36.3 C)  Resp:  18    General:  Alert oriented Cardiovascular:  S1-S2 no murmur rub or  gallop Respiratory:  Clinically clear  Discharge Instructions You were cared for by a hospitalist during your hospital stay. If you have any questions about your discharge medications or the care you received while you were in the hospital after you are discharged, you can call the unit and asked to speak with the hospitalist on call if the hospitalist that took care of you is not available. Once you are discharged, your primary care physician will handle any further medical issues. Please note that NO REFILLS for any discharge medications will be authorized once you are discharged, as it is imperative that you return to your primary care physician (or establish a relationship with a primary care physician if you do not have one) for your aftercare needs so that they can reassess your need for medications and monitor your lab values.  Discharge Instructions   Diet - low sodium heart healthy    Complete by:  As directed      Discharge instructions    Complete by:  As directed   Continue Cipro 500 once a day and flagyl 3 x a day-we do not know why you had diarrhea but you need to complete your antibiotics Continue your other scheduled medicines-make sure that you take your Midodrine 3 x a day for low blood pressure-your nephrologist will need to adjust your dialysis We will get Home health to come and help you out at home although the therapist recommendations were for you to get therapy at a Skilled Facility     Increase activity slowly    Complete by:  As directed           Current Discharge Medication List    START taking these medications   Details  ciprofloxacin (CIPRO) 500 MG tablet Take 1 tablet (500 mg total) by mouth daily. Qty: 6 tablet, Refills: 0    metroNIDAZOLE (FLAGYL) 500 MG tablet Take 1 tablet (500 mg total) by mouth every 8 (eight) hours. Qty: 18 tablet, Refills: 0      CONTINUE these medications which have CHANGED   Details  midodrine (PROAMATINE) 10 MG tablet Take 1  tablet (10 mg total) by mouth 3 (three) times daily with meals. Qty: 90 tablet, Refills: 0      CONTINUE these medications which have NOT CHANGED   Details  aspirin 325 MG tablet Take 1 tablet (325 mg total) by mouth daily.    brimonidine (ALPHAGAN) 0.2 % ophthalmic solution Place 1 drop into both eyes 2 (two) times daily.    calcium acetate (PHOSLO) 667 MG capsule Take 3,335 mg by mouth 3 (three) times daily with meals.    calcium-vitamin D (OSCAL WITH D) 500-200 MG-UNIT per tablet Take 1 tablet by mouth daily with breakfast.     cinacalcet (SENSIPAR) 60 MG tablet Take 60 mg by mouth daily.    clopidogrel (PLAVIX) 75 MG tablet Take 75 mg by mouth daily with breakfast.    diphenhydrAMINE (BENADRYL) 25 MG tablet Take 25 mg by mouth every 6 (six) hours as needed for itching or allergies.    gabapentin (NEURONTIN) 100 MG capsule Take 100 mg by mouth 3 (three) times daily.    insulin aspart (NOVOLOG) 100 UNIT/ML injection Check CBG AC and HS CBG 121-150   1 unit CBG 151-200   2 units CBG 201-250   3 units CBG 251-300   5 units CBG 301-350   7  units CBG 351-400   9 units CBG > 400   CALL MD Qty: 10 mL, Refills: 11    insulin glargine (LANTUS) 100 UNIT/ML injection Inject 25 Units into the skin 2 (two) times daily.    loperamide (IMODIUM A-D) 2 MG tablet Take 2 mg by mouth 4 (four) times daily as needed for diarrhea or loose stools.    metoprolol (LOPRESSOR) 50 MG tablet Take 1 tablet (50 mg total) by mouth 3 (three) times daily as needed (as needed for systolic BP > 180).    multivitamin (RENA-VIT) TABS tablet Take 1 tablet by mouth at bedtime. Refills: 0    omeprazole (PRILOSEC) 20 MG capsule Take 20 mg by mouth 2 (two) times daily.     polyvinyl alcohol (LIQUIFILM TEARS) 1.4 % ophthalmic solution Place 1 drop into both eyes daily as needed for dry eyes.    pravastatin (PRAVACHOL) 40 MG tablet Take 40 mg by mouth at bedtime.     rOPINIRole (REQUIP) 0.25 MG tablet Take 0.5  mg by mouth at bedtime. 1 tablet twice a day and 2 tablets at bedtime.    sennosides-docusate sodium (SENOKOT-S) 8.6-50 MG tablet Take 1 tablet by mouth 2 (two) times daily as needed for constipation. For constipation    albuterol (PROVENTIL HFA;VENTOLIN HFA) 108 (90 BASE) MCG/ACT inhaler Inhale into the lungs every 6 (six) hours as needed for wheezing or shortness of breath.    albuterol (PROVENTIL) (2.5 MG/3ML) 0.083% nebulizer solution Take 2.5 mg by nebulization every 6 (six) hours as needed for wheezing or shortness of breath.       Allergies  Allergen Reactions  . Cefepime Itching  . Penicillins Hives and Swelling      The results of significant diagnostics from this hospitalization (including imaging, microbiology, ancillary and laboratory) are listed below for reference.    Significant Diagnostic Studies: Nm Myocar Multi W/spect W/wall Motion / Ef  09/18/2013   CLINICAL DATA:  Chest pain, CHF.  CAD.  EXAM: MYOCARDIAL IMAGING WITH SPECT (REST AND PHARMACOLOGIC-STRESS)  GATED LEFT VENTRICULAR WALL MOTION STUDY  LEFT VENTRICULAR EJECTION FRACTION  TECHNIQUE: Standard myocardial SPECT imaging was performed after resting intravenous injection of 10 mCi Tc-66m sestamibi. Subsequently, intravenous infusion of Lexiscan was performed under the supervision of the Cardiology staff. At peak effect of the drug, 30 mCi Tc-42m sestamibi was injected intravenously and standard myocardial SPECT imaging was performed. Quantitative gated imaging was also performed to evaluate left ventricular wall motion, and estimate left ventricular ejection fraction.  COMPARISON:  None.  FINDINGS: Perfusion: Decreased activity noted in the inferior wall on rest images, improving on stress images compatible with diaphragmatic attenuation. No fixed or reversible defects to suggest ischemia or infarct  Wall Motion: Normal left ventricular wall motion. No left ventricular dilation.  Left Ventricular Ejection Fraction: 48 %   End diastolic volume 82 ml  End systolic volume 43 ml  IMPRESSION: 1. No reversible ischemia or infarction.  2. Normal left ventricular wall motion.  3. Left ventricular ejection fraction 48%  4. Intermediate-risk stress test findings* based on slightly decreased left ventricular ejection fraction.  *2012 Appropriate Use Criteria for Coronary Revascularization Focused Update: J Am Coll Cardiol. 2012;59(9):857-881. http://content.dementiazones.com.aspx?articleid=1201161   Electronically Signed   By: Charlett Nose M.D.   On: 09/18/2013 16:23    Microbiology: Recent Results (from the past 240 hour(s))  CULTURE, BLOOD (ROUTINE X 2)     Status: None   Collection Time    09/16/13  1:16 AM  Result Value Ref Range Status   Specimen Description BLOOD LEFT FOREARM   Final   Special Requests BOTTLES DRAWN AEROBIC AND ANAEROBIC 5CC   Final   Culture  Setup Time     Final   Value: 09/17/2013 09:09     Performed at [facility]   Culture     Final   Value:        BLOOD CULTURE RECEIVED NO GROWTH TO DATE CULTURE WILL BE HELD FOR 5 DAYS BEFORE ISSUING A FINAL NEGATIVE REPORT     Performed at [facility]   Report Status PENDING   Incomplete  STOOL CULTURE     Status: None   Collection Time    09/16/13  7:01 PM      Result Value Ref Range Status   Specimen Description STOOL   Final   Special Requests Normal   Final   Culture     Final   Value: NO SUSPICIOUS COLONIES, CONTINUING TO HOLD     Performed at [facility]   Report Status PENDING   Incomplete  CLOSTRIDIUM DIFFICILE BY PCR     Status: None   Collection Time    09/16/13  7:01 PM      Result Value Ref Range Status   C difficile by pcr NEGATIVE  NEGATIVE Final  CULTURE, BLOOD (ROUTINE X 2)     Status: None   Collection Time    09/17/13 12:30 AM      Result Value Ref Range Status   Specimen Description BLOOD LEFT ANTECUBITAL   Final   Special Requests BOTTLES DRAWN AEROBIC AND ANAEROBIC EACH   Final    Culture  Setup Time     Final   Value: 09/17/2013 04:20     Performed at [facility]   Culture     Final   Value:        BLOOD CULTURE RECEIVED NO GROWTH TO DATE CULTURE WILL BE HELD FOR 5 DAYS BEFORE ISSUING A FINAL NEGATIVE REPORT     Performed at [facility]   Report Status PENDING   Incomplete  MRSA PCR SCREENING     Status: None   Collection Time    09/17/13  5:43 AM      Result Value Ref Range Status   MRSA by PCR NEGATIVE  NEGATIVE Final   Comment:            The GeneXpert MRSA Assay (FDA     approved for NASAL specimens     only), is one component of a     comprehensive MRSA colonization     surveillance program. It is not     intended to diagnose MRSA     infection nor to guide or     monitor treatment for     MRSA infections.     Labs: Basic Metabolic Panel:  Recent Labs Lab 09/16/13 1905 09/17/13 0205 09/17/13 1133 09/18/13 0240 09/19/13 0346  NA 133* 133*  --  134* 134*  K 4.5 3.6*  --  4.3 4.6  CL 95* 95*  --  95* 97  CO2 22 23  --  23 17*  GLUCOSE 178* 231*  --  109* 117*  BUN 21 26*  --  36* 39*  CREATININE 4.91* 5.72*  --  7.57* 8.84*  CALCIUM 9.0 8.9  --  8.9 9.1  MG  --   --  1.5  --   --  PHOS  --   --  6.2* 6.6* 7.0*   Liver Function Tests:  Recent Labs Lab 09/17/13 0205 09/18/13 0240 09/19/13 0346  AST 13  --   --   ALT 10  --   --   ALKPHOS 120*  --   --   BILITOT 0.3  --   --   PROT 5.5*  --   --   ALBUMIN 2.5* 2.7* 2.8*    Recent Labs Lab 09/17/13 1630  LIPASE 16  AMYLASE 23    Recent Labs Lab 09/17/13 1630  AMMONIA 38   CBC:  Recent Labs Lab 09/16/13 1905 09/17/13 0025 09/18/13 0535 09/19/13 0346 09/20/13 0336  WBC 10.8* 9.1 10.4 10.9* 7.8  NEUTROABS 8.4* 6.5  --  7.0 5.1  HGB 11.9* 11.1* 11.4* 11.3* 11.3*  HCT 37.0* 34.1* 35.5* 34.8* 34.3*  MCV 95.9 96.3 97.0 96.4 95.0  PLT 214 178 275 257 PLATELET CLUMPS NOTED ON SMEAR, COUNT APPEARS ADEQUATE   Cardiac Enzymes:  Recent Labs Lab  09/17/13 0025 09/17/13 1133 09/17/13 1630 09/17/13 2224  CKTOTAL  --   --  154  --   CKMB  --   --  6.8*  --   TROPONINI <0.30 0.38* 0.35* 0.32*   BNP: BNP (last 3 results) No results found for this basename: PROBNP,  in the last 8760 hours CBG:  Recent Labs Lab 09/18/13 1631 09/18/13 2202 09/19/13 0755 09/19/13 1549 09/19/13 2207  GLUCAP 190* 118* 169* 209* 149*       Signed:  Rhetta Mura  [facility] 09/20/2013, 7:36 AM

## 2013-09-20 NOTE — Care Management Note (Signed)
CARE MANAGEMENT NOTE 09/20/2013  Patient:  ERIKSEN, SABATELLI   Account Number:  [id number]  Date Initiated:  09/20/2013  Documentation initiated by:  Raiford Noble  Subjective/Objective Assessment:   orthostasis     Action/Plan:   home with home health. lives with wife. has VA insurance   Anticipated DC Date:  09/20/2013   Anticipated DC Plan:  HOME W HOME HEALTH SERVICES      DC Planning Services  CM consult      [facility] Choice  Resumption Of Svcs/PTA Provider   Choice offered to / List presented to:  C-1 Patient        HH arranged  HH-1 RN  HH-2 PT  HH-3 OT      [facility] agency  [facility]   Status of service:  Completed, signed off Medicare Important Message given?   (If response is "NO", the following Medicare IM given date fields will be blank) Date Medicare IM given:   Medicare IM given by:   Date Additional Medicare IM given:   Additional Medicare IM given by:    Discharge Disposition:  HOME W HOME HEALTH SERVICES  Per UR Regulation:    If discussed at Long Length of Stay Meetings, dates discussed:    Comments:  09/20/13 ATIKAHALLRNCM [phone] Patient was active with [facility] out of the [place] office prior to admission. Spoke with patient who admantly refuses SNF. Insisted on going home. Orders for [facility] RN/PT/OT services faxed to [facility] office with faxed confirmation received. Made Marny Lowenstein with [facility] aware of discharge as well. No further needs assessed as patient reports he has equipment at home. ATIKAHALLRNCM [phone]

## 2013-09-20 NOTE — Progress Notes (Signed)
Patient ID: [name], male   DOB: May 13, 1945, 68 y.o.   MRN: [id number]  [facility] Progress Note    Subjective:   No new complaints other than he wants to go home   Objective:   BP 154/130  Pulse 91  Temp(Src) 97.6 F (36.4 C) (Oral)  Resp 15  Ht 6' (1.829 m)  Wt 100.1 kg (220 lb 10.9 oz)  BMI 29.92 kg/m2  SpO2 98%  Intake/Output: I/O last 3 completed shifts: In: 243 [P.O.:240; I.V.:3] Out: 750 [Other:750]   Intake/Output this shift:  Total I/O In: -  Out: 1 [Stool:1] Weight change: -10 kg (-22 lb 0.7 oz)  Physical Exam: Gen:WD obese WM in NAD CVS:no rub Resp:cta VIF:BPPHKF Ext:no edema  Labs: BMET  Recent Labs Lab 09/16/13 1905 09/17/13 0205 09/17/13 1133 09/18/13 0240 09/19/13 0346  NA 133* 133*  --  134* 134*  K 4.5 3.6*  --  4.3 4.6  CL 95* 95*  --  95* 97  CO2 22 23  --  23 17*  GLUCOSE 178* 231*  --  109* 117*  BUN 21 26*  --  36* 39*  CREATININE 4.91* 5.72*  --  7.57* 8.84*  ALBUMIN  --  2.5*  --  2.7* 2.8*  CALCIUM 9.0 8.9  --  8.9 9.1  PHOS  --   --  6.2* 6.6* 7.0*   CBC  Recent Labs Lab 09/16/13 1905 09/17/13 0025 09/18/13 0535 09/19/13 0346 09/20/13 0336  WBC 10.8* 9.1 10.4 10.9* 7.8  NEUTROABS 8.4* 6.5  --  7.0 5.1  HGB 11.9* 11.1* 11.4* 11.3* 11.3*  HCT 37.0* 34.1* 35.5* 34.8* 34.3*  MCV 95.9 96.3 97.0 96.4 95.0  PLT 214 178 275 257 PLATELET CLUMPS NOTED ON SMEAR, COUNT APPEARS ADEQUATE    @IMGRELPRIORS @ Medications:    . antiseptic oral rinse  7 mL Mouth Rinse BID  . aspirin  325 mg Oral Daily  . brimonidine  1 drop Both Eyes BID  . calcium acetate  3,335 mg Oral TID WC  . calcium-vitamin D  1 tablet Oral Q breakfast  . cinacalcet  60 mg Oral Q breakfast  . ciprofloxacin  500 mg Oral Q24H  . clopidogrel  75 mg Oral Q breakfast  . gabapentin  100 mg Oral TID  . heparin  5,000 Units Subcutaneous 3 times per day  . insulin aspart  0-9 Units Subcutaneous TID WC  . insulin aspart  3 Units  Subcutaneous TID WC  . insulin glargine  25 Units Subcutaneous BID  . metroNIDAZOLE  500 mg Oral 3 times per day  . midodrine  10 mg Oral TID WC  . multivitamin  1 tablet Oral QHS  . pantoprazole  40 mg Oral Daily  . pravastatin  40 mg Oral Daily  . rOPINIRole  0.25 mg Oral BID AC  . rOPINIRole  0.5 mg Oral QHS  . sodium chloride  3 mL Intravenous Q12H    Dialysis Orders: MWF [facility]  4hr 15 min 107kg 2K/2Ca 8000 Heparin L IJ cath 450/1.5  Aranesp 25 q 4 week Venofer 50 q week hectorol 2  Profile 4  Assessment/Plan:  1. Hypotension - likely hypovolemic sec to diarrhea & HD, missed Midodrine dose, much better.  2. Diarrhea - chronic, C diff negative, on Cipro & Flagyl.  3. ESRD - HD on MWF @ [facility], K 4.6, unable to run yesterday sec to poorly functioning catheter. HD today.  4. Hypotension/Volume - BP 124/67, Midodrine  10 mg tid; no signs or symptoms of fluid overload, attempt 1 L UF goal, if BP remains stable. 5. Vascular access- improved BFR after activase overnight dwell.  6. Anemia - Hgb 11.3, Aranesp q4wks.  7. Sec HPT - Ca 9.1 (10.1 corrected), P 7; Sensipar 60 mg qd, Phoslo 4 with meals.  8. Nutrition - Alb 2.8, renal diet, multivitamin.  9. DM - per primary. 10. Dispo- for d/c home per primary svc  Camaria Gerald A 09/20/2013, 11:18 AM

## 2013-09-23 LAB — CULTURE, BLOOD (ROUTINE X 2)
CULTURE: NO GROWTH
Culture: NO GROWTH

## 2013-10-26 ENCOUNTER — Emergency Department (HOSPITAL_COMMUNITY): Payer: Medicare Other

## 2013-10-26 ENCOUNTER — Encounter (HOSPITAL_COMMUNITY): Payer: Self-pay | Admitting: Emergency Medicine

## 2013-10-26 ENCOUNTER — Inpatient Hospital Stay (HOSPITAL_COMMUNITY)
Admission: EM | Admit: 2013-10-26 | Discharge: 2013-10-28 | DRG: 682 | Disposition: A | Discharge status: Home / Self Care | Payer: Medicare Other | Attending: Internal Medicine | Admitting: Internal Medicine

## 2013-10-26 DIAGNOSIS — Z96642 Presence of left artificial hip joint: Secondary | ICD-10-CM | POA: Diagnosis present

## 2013-10-26 DIAGNOSIS — I953 Hypotension of hemodialysis: Secondary | ICD-10-CM

## 2013-10-26 DIAGNOSIS — Z9842 Cataract extraction status, left eye: Secondary | ICD-10-CM | POA: Diagnosis not present

## 2013-10-26 DIAGNOSIS — E875 Hyperkalemia: Secondary | ICD-10-CM | POA: Diagnosis present

## 2013-10-26 DIAGNOSIS — I350 Nonrheumatic aortic (valve) stenosis: Secondary | ICD-10-CM | POA: Diagnosis present

## 2013-10-26 DIAGNOSIS — R778 Other specified abnormalities of plasma proteins: Secondary | ICD-10-CM

## 2013-10-26 DIAGNOSIS — Z992 Dependence on renal dialysis: Secondary | ICD-10-CM

## 2013-10-26 DIAGNOSIS — K219 Gastro-esophageal reflux disease without esophagitis: Secondary | ICD-10-CM | POA: Diagnosis present

## 2013-10-26 DIAGNOSIS — J449 Chronic obstructive pulmonary disease, unspecified: Secondary | ICD-10-CM | POA: Diagnosis present

## 2013-10-26 DIAGNOSIS — I951 Orthostatic hypotension: Secondary | ICD-10-CM

## 2013-10-26 DIAGNOSIS — E662 Morbid (severe) obesity with alveolar hypoventilation: Secondary | ICD-10-CM

## 2013-10-26 DIAGNOSIS — R079 Chest pain, unspecified: Secondary | ICD-10-CM | POA: Diagnosis present

## 2013-10-26 DIAGNOSIS — Z7902 Long term (current) use of antithrombotics/antiplatelets: Secondary | ICD-10-CM

## 2013-10-26 DIAGNOSIS — N186 End stage renal disease: Secondary | ICD-10-CM | POA: Diagnosis present

## 2013-10-26 DIAGNOSIS — D649 Anemia, unspecified: Secondary | ICD-10-CM | POA: Diagnosis present

## 2013-10-26 DIAGNOSIS — I509 Heart failure, unspecified: Secondary | ICD-10-CM | POA: Diagnosis present

## 2013-10-26 DIAGNOSIS — I429 Cardiomyopathy, unspecified: Secondary | ICD-10-CM

## 2013-10-26 DIAGNOSIS — Z794 Long term (current) use of insulin: Secondary | ICD-10-CM

## 2013-10-26 DIAGNOSIS — I2510 Atherosclerotic heart disease of native coronary artery without angina pectoris: Secondary | ICD-10-CM | POA: Diagnosis present

## 2013-10-26 DIAGNOSIS — Z6837 Body mass index (BMI) 37.0-37.9, adult: Secondary | ICD-10-CM

## 2013-10-26 DIAGNOSIS — I120 Hypertensive chronic kidney disease with stage 5 chronic kidney disease or end stage renal disease: Principal | ICD-10-CM | POA: Diagnosis present

## 2013-10-26 DIAGNOSIS — Z961 Presence of intraocular lens: Secondary | ICD-10-CM | POA: Diagnosis present

## 2013-10-26 DIAGNOSIS — Z87891 Personal history of nicotine dependence: Secondary | ICD-10-CM

## 2013-10-26 DIAGNOSIS — J45909 Unspecified asthma, uncomplicated: Secondary | ICD-10-CM | POA: Diagnosis present

## 2013-10-26 DIAGNOSIS — I471 Supraventricular tachycardia: Secondary | ICD-10-CM

## 2013-10-26 DIAGNOSIS — R531 Weakness: Secondary | ICD-10-CM

## 2013-10-26 DIAGNOSIS — E119 Type 2 diabetes mellitus without complications: Secondary | ICD-10-CM

## 2013-10-26 DIAGNOSIS — R52 Pain, unspecified: Secondary | ICD-10-CM

## 2013-10-26 DIAGNOSIS — Z79899 Other long term (current) drug therapy: Secondary | ICD-10-CM | POA: Diagnosis not present

## 2013-10-26 DIAGNOSIS — I4510 Unspecified right bundle-branch block: Secondary | ICD-10-CM

## 2013-10-26 DIAGNOSIS — Z8673 Personal history of transient ischemic attack (TIA), and cerebral infarction without residual deficits: Secondary | ICD-10-CM | POA: Diagnosis not present

## 2013-10-26 DIAGNOSIS — Z7982 Long term (current) use of aspirin: Secondary | ICD-10-CM | POA: Diagnosis not present

## 2013-10-26 DIAGNOSIS — N2581 Secondary hyperparathyroidism of renal origin: Secondary | ICD-10-CM | POA: Diagnosis present

## 2013-10-26 DIAGNOSIS — R7881 Bacteremia: Secondary | ICD-10-CM

## 2013-10-26 DIAGNOSIS — I252 Old myocardial infarction: Secondary | ICD-10-CM | POA: Diagnosis not present

## 2013-10-26 DIAGNOSIS — Z9981 Dependence on supplemental oxygen: Secondary | ICD-10-CM

## 2013-10-26 DIAGNOSIS — G8929 Other chronic pain: Secondary | ICD-10-CM

## 2013-10-26 DIAGNOSIS — R7989 Other specified abnormal findings of blood chemistry: Secondary | ICD-10-CM

## 2013-10-26 DIAGNOSIS — H547 Unspecified visual loss: Secondary | ICD-10-CM

## 2013-10-26 DIAGNOSIS — I441 Atrioventricular block, second degree: Secondary | ICD-10-CM

## 2013-10-26 DIAGNOSIS — I440 Atrioventricular block, first degree: Secondary | ICD-10-CM

## 2013-10-26 DIAGNOSIS — G4733 Obstructive sleep apnea (adult) (pediatric): Secondary | ICD-10-CM

## 2013-10-26 DIAGNOSIS — G9340 Encephalopathy, unspecified: Secondary | ICD-10-CM

## 2013-10-26 DIAGNOSIS — I10 Essential (primary) hypertension: Secondary | ICD-10-CM | POA: Diagnosis present

## 2013-10-26 LAB — BASIC METABOLIC PANEL
ANION GAP: 20 — AB (ref 5–15)
Anion gap: 16 — ABNORMAL HIGH (ref 5–15)
BUN: 61 mg/dL — AB (ref 6–23)
BUN: 48 mg/dL — ABNORMAL HIGH (ref 6–23)
CO2: 22 meq/L (ref 19–32)
CO2: 25 mEq/L (ref 19–32)
CREATININE: 9.16 mg/dL — AB (ref 0.50–1.35)
Calcium: 9.3 mg/dL (ref 8.4–10.5)
Calcium: 9 mg/dL (ref 8.4–10.5)
Chloride: 92 mEq/L — ABNORMAL LOW (ref 96–112)
Chloride: 98 mEq/L (ref 96–112)
Creatinine, Ser: 7.26 mg/dL — ABNORMAL HIGH (ref 0.50–1.35)
GFR calc Af Amer: 8 mL/min — ABNORMAL LOW (ref >90)
GFR calc non Af Amer: 5 mL/min — ABNORMAL LOW (ref >90)
GFR calc non Af Amer: 7 mL/min — ABNORMAL LOW (ref >90)
GFR, EST AFRICAN AMERICAN: 6 mL/min — AB (ref >90)
Glucose, Bld: 166 mg/dL — ABNORMAL HIGH (ref 70–99)
Glucose, Bld: 160 mg/dL — ABNORMAL HIGH (ref 70–99)
POTASSIUM: 6.6 meq/L — AB (ref 3.7–5.3)
Potassium: 4.9 mEq/L (ref 3.7–5.3)
Sodium: 134 mEq/L — ABNORMAL LOW (ref 137–147)
Sodium: 139 mEq/L (ref 137–147)

## 2013-10-26 LAB — POCT I-STAT, CHEM 8
BUN: 37 mg/dL — AB (ref 6–23)
CHLORIDE: 101 meq/L (ref 96–112)
Calcium, Ion: 1.22 mmol/L (ref 1.13–1.30)
Creatinine, Ser: 6 mg/dL — ABNORMAL HIGH (ref 0.50–1.35)
GLUCOSE: 104 mg/dL — AB (ref 70–99)
HCT: 34 % — ABNORMAL LOW (ref 39.0–52.0)
Hemoglobin: 11.6 g/dL — ABNORMAL LOW (ref 13.0–17.0)
POTASSIUM: 4 meq/L (ref 3.7–5.3)
Sodium: 140 mEq/L (ref 137–147)
TCO2: 25 mmol/L (ref 0–100)

## 2013-10-26 LAB — CBC WITH DIFFERENTIAL/PLATELET
BASOS PCT: 0 % (ref 0–1)
Basophils Absolute: 0 10*3/uL (ref 0.0–0.1)
Eosinophils Absolute: 0.1 10*3/uL (ref 0.0–0.7)
Eosinophils Relative: 1 % (ref 0–5)
HEMATOCRIT: 36.6 % — AB (ref 39.0–52.0)
HEMOGLOBIN: 11.8 g/dL — AB (ref 13.0–17.0)
LYMPHS PCT: 8 % — AB (ref 12–46)
Lymphs Abs: 1.2 10*3/uL (ref 0.7–4.0)
MCH: 30.4 pg (ref 26.0–34.0)
MCHC: 32.2 g/dL (ref 30.0–36.0)
MCV: 94.3 fL (ref 78.0–100.0)
MONO ABS: 1.1 10*3/uL — AB (ref 0.1–1.0)
MONOS PCT: 7 % (ref 3–12)
NEUTROS ABS: 13.1 10*3/uL — AB (ref 1.7–7.7)
NEUTROS PCT: 84 % — AB (ref 43–77)
Platelets: 263 10*3/uL (ref 150–400)
RBC: 3.88 MIL/uL — AB (ref 4.22–5.81)
RDW: 12.8 % (ref 11.5–15.5)
WBC: 15.6 10*3/uL — AB (ref 4.0–10.5)

## 2013-10-26 LAB — POTASSIUM: POTASSIUM: 6.9 meq/L — AB (ref 3.7–5.3)

## 2013-10-26 LAB — GLUCOSE, CAPILLARY
Glucose-Capillary: 130 mg/dL — ABNORMAL HIGH (ref 70–99)
Glucose-Capillary: 125 mg/dL — ABNORMAL HIGH (ref 70–99)

## 2013-10-26 LAB — TROPONIN I: Troponin I: <0.3 ng/mL (ref <0.30)

## 2013-10-26 MED ORDER — ACETAMINOPHEN 325 MG PO TABS: 650.0000 mg | ORAL_TABLET | Freq: Four times a day (QID) | ORAL | Status: DC | PRN

## 2013-10-26 MED ORDER — LATANOPROST 0.005 % OP SOLN
1.0000 [drp] | Freq: Every day | OPHTHALMIC | Status: DC
  Administered 2013-10-27 (×2): 1 [drp] via OPHTHALMIC
  Filled 2013-10-26: qty 2.5

## 2013-10-26 MED ORDER — POLYVINYL ALCOHOL 1.4 % OP SOLN: 1.0000 [drp] | Freq: Every day | OPHTHALMIC | Status: DC | PRN

## 2013-10-26 MED ORDER — INSULIN GLARGINE 100 UNIT/ML ~~LOC~~ SOLN
30.0000 [IU] | Freq: Two times a day (BID) | SUBCUTANEOUS | Status: DC
  Administered 2013-10-27 – 2013-10-28 (×4): 30 [IU] via SUBCUTANEOUS
  Filled 2013-10-26 (×5): qty 0.3

## 2013-10-26 MED ORDER — METOPROLOL TARTRATE 50 MG PO TABS
50.0000 mg | ORAL_TABLET | Freq: Three times a day (TID) | ORAL | Status: DC | PRN
  Filled 2013-10-26: qty 1

## 2013-10-26 MED ORDER — NEPRO/CARBSTEADY PO LIQD: 237.0000 mL | ORAL | Status: DC | PRN

## 2013-10-26 MED ORDER — ASPIRIN EC 325 MG PO TBEC
325.0000 mg | DELAYED_RELEASE_TABLET | Freq: Every day | ORAL | Status: DC
  Administered 2013-10-27 – 2013-10-28 (×2): 325 mg via ORAL
  Filled 2013-10-26 (×2): qty 1

## 2013-10-26 MED ORDER — INSULIN ASPART 100 UNIT/ML ~~LOC~~ SOLN
0.0000 [IU] | Freq: Three times a day (TID) | SUBCUTANEOUS | Status: DC
  Administered 2013-10-27: 2 [IU] via SUBCUTANEOUS
  Administered 2013-10-27: 1 [IU] via SUBCUTANEOUS

## 2013-10-26 MED ORDER — SODIUM CHLORIDE 0.9 % IV SOLN
1.0000 g | Freq: Once | INTRAVENOUS | Status: AC
  Administered 2013-10-26: 1 g via INTRAVENOUS
  Filled 2013-10-26 (×2): qty 10

## 2013-10-26 MED ORDER — TIMOLOL MALEATE 0.5 % OP SOLN
1.0000 [drp] | Freq: Every day | OPHTHALMIC | Status: DC
  Administered 2013-10-27 – 2013-10-28 (×3): 1 [drp] via OPHTHALMIC
  Filled 2013-10-26: qty 5

## 2013-10-26 MED ORDER — PANTOPRAZOLE SODIUM 40 MG PO TBEC
40.0000 mg | DELAYED_RELEASE_TABLET | Freq: Every day | ORAL | Status: DC
  Administered 2013-10-27 – 2013-10-28 (×3): 40 mg via ORAL
  Filled 2013-10-26 (×2): qty 1

## 2013-10-26 MED ORDER — IOHEXOL 350 MG/ML SOLN
100.0000 mL | Freq: Once | INTRAVENOUS | Status: AC | PRN
  Administered 2013-10-26: 100 mL via INTRAVENOUS

## 2013-10-26 MED ORDER — ROPINIROLE HCL 0.5 MG PO TABS
0.5000 mg | ORAL_TABLET | Freq: Every day | ORAL | Status: DC
  Administered 2013-10-27 (×2): 0.5 mg via ORAL
  Filled 2013-10-26 (×3): qty 1

## 2013-10-26 MED ORDER — CYANOCOBALAMIN 500 MCG PO TABS
500.0000 ug | ORAL_TABLET | Freq: Every day | ORAL | Status: DC
  Administered 2013-10-27: 01:00:00 via ORAL
  Administered 2013-10-27 – 2013-10-28 (×2): 500 ug via ORAL
  Filled 2013-10-26 (×3): qty 1

## 2013-10-26 MED ORDER — ENOXAPARIN SODIUM 30 MG/0.3ML ~~LOC~~ SOLN
30.0000 mg | SUBCUTANEOUS | Status: DC
  Administered 2013-10-27 (×2): 30 mg via SUBCUTANEOUS
  Filled 2013-10-26 (×3): qty 0.3

## 2013-10-26 MED ORDER — CALCITRIOL 0.5 MCG PO CAPS
2.0000 ug | ORAL_CAPSULE | ORAL | Status: DC
  Administered 2013-10-28: 2 ug via ORAL
  Filled 2013-10-26: qty 4

## 2013-10-26 MED ORDER — SODIUM CHLORIDE 0.9 % IV SOLN
62.5000 mg | INTRAVENOUS | Status: DC
  Administered 2013-10-28: 62.5 mg via INTRAVENOUS
  Filled 2013-10-26 (×2): qty 5

## 2013-10-26 MED ORDER — ALBUTEROL SULFATE (2.5 MG/3ML) 0.083% IN NEBU: 2.5000 mg | INHALATION_SOLUTION | Freq: Four times a day (QID) | RESPIRATORY_TRACT | Status: DC | PRN

## 2013-10-26 MED ORDER — ACETAMINOPHEN 650 MG RE SUPP
650.0000 mg | Freq: Four times a day (QID) | RECTAL | Status: DC | PRN
Start: 2013-10-26 — End: 2013-10-28

## 2013-10-26 MED ORDER — CLOPIDOGREL BISULFATE 75 MG PO TABS
75.0000 mg | ORAL_TABLET | Freq: Every day | ORAL | Status: DC
  Administered 2013-10-27 – 2013-10-28 (×2): 75 mg via ORAL
  Filled 2013-10-26 (×3): qty 1

## 2013-10-26 MED ORDER — MIDODRINE HCL 5 MG PO TABS
10.0000 mg | ORAL_TABLET | ORAL | Status: DC
  Administered 2013-10-28: 10 mg via ORAL
  Filled 2013-10-26: qty 2

## 2013-10-26 MED ORDER — ONDANSETRON HCL 4 MG/2ML IJ SOLN: 4.0000 mg | Freq: Four times a day (QID) | INTRAMUSCULAR | Status: DC | PRN

## 2013-10-26 MED ORDER — CALCIUM ACETATE 667 MG PO CAPS
2668.0000 mg | ORAL_CAPSULE | Freq: Three times a day (TID) | ORAL | Status: DC
  Administered 2013-10-27 – 2013-10-28 (×5): 2668 mg via ORAL
  Filled 2013-10-26 (×7): qty 4

## 2013-10-26 MED ORDER — DOCUSATE SODIUM 100 MG PO CAPS
100.0000 mg | ORAL_CAPSULE | Freq: Two times a day (BID) | ORAL | Status: DC
  Administered 2013-10-27 – 2013-10-28 (×4): 100 mg via ORAL
  Filled 2013-10-26 (×5): qty 1

## 2013-10-26 MED ORDER — CALCIUM CARBONATE-VITAMIN D 500-200 MG-UNIT PO TABS
1.0000 | ORAL_TABLET | Freq: Every day | ORAL | Status: DC
  Administered 2013-10-27 – 2013-10-28 (×2): 1 via ORAL
  Filled 2013-10-26 (×3): qty 1

## 2013-10-26 MED ORDER — GABAPENTIN 100 MG PO CAPS
100.0000 mg | ORAL_CAPSULE | Freq: Three times a day (TID) | ORAL | Status: DC
  Administered 2013-10-27 – 2013-10-28 (×5): 100 mg via ORAL
  Filled 2013-10-26 (×7): qty 1

## 2013-10-26 MED ORDER — PRAVASTATIN SODIUM 40 MG PO TABS
40.0000 mg | ORAL_TABLET | Freq: Every day | ORAL | Status: DC
  Administered 2013-10-27 (×2): 40 mg via ORAL
  Filled 2013-10-26 (×3): qty 1

## 2013-10-26 MED ORDER — ONDANSETRON HCL 4 MG PO TABS: 4.0000 mg | ORAL_TABLET | Freq: Four times a day (QID) | ORAL | Status: DC | PRN

## 2013-10-26 MED ORDER — TRAMADOL HCL 50 MG PO TABS
50.0000 mg | ORAL_TABLET | Freq: Four times a day (QID) | ORAL | Status: DC | PRN
  Administered 2013-10-27: 50 mg via ORAL
  Filled 2013-10-26: qty 1

## 2013-10-26 MED ORDER — CINACALCET HCL 30 MG PO TABS
60.0000 mg | ORAL_TABLET | Freq: Every day | ORAL | Status: DC
  Administered 2013-10-27 – 2013-10-28 (×2): 60 mg via ORAL
  Filled 2013-10-26 (×3): qty 2

## 2013-10-26 MED ORDER — INSULIN ASPART 100 UNIT/ML ~~LOC~~ SOLN
10.0000 [IU] | Freq: Once | SUBCUTANEOUS | Status: AC
  Administered 2013-10-26: 10 [IU] via INTRAVENOUS
  Filled 2013-10-26: qty 1

## 2013-10-26 MED ORDER — DEXTROSE 50 % IV SOLN
50.0000 mL | Freq: Once | INTRAVENOUS | Status: AC
  Administered 2013-10-26: 50 mL via INTRAVENOUS
  Filled 2013-10-26: qty 50

## 2013-10-26 MED ORDER — ALBUTEROL SULFATE HFA 108 (90 BASE) MCG/ACT IN AERS: 2.0000 | INHALATION_SPRAY | Freq: Four times a day (QID) | RESPIRATORY_TRACT | Status: DC | PRN

## 2013-10-26 NOTE — ED Notes (Signed)
RN attempt blood draw. Unsuccessful. EMT to attempt.

## 2013-10-26 NOTE — ED Notes (Signed)
2nd RN attempt at IV failed. IV team called.

## 2013-10-26 NOTE — ED Provider Notes (Signed)
CSN: [id number]     Arrival date & time 10/26/13  4098 History   First MD Initiated Contact with Patient 10/26/13 0901     Chief Complaint  Patient presents with  . Chest Pain   Patient is a poor historian  (Consider location/radiation/quality/duration/timing/severity/associated sxs/prior Treatment) HPI Complains of anterior chest pain radiating to neck onset last night. Pain is intermittent and lasts a few seconds at a time.. Nothing makes symptoms better or worse pain onset at rest. Brought by EMS. EMS treated patient with aspirin prior to coming here. EMS Attempted IV access, without success. Symptoms accompanied by shortness of breath however he is been short of breath for a prolonged period of time, unchanged No other associated symptoms Past Medical History  Diagnosis Date  . CHF (congestive heart failure)   . CAD (coronary artery disease) 2006  . COPD (chronic obstructive pulmonary disease)   . Hyperlipidemia   . ESRD (end stage renal disease) on dialysis started 08/2006    Started HD in September 2008.  R arm AVG placed 2008 was ligated 2009 due to steal syndrome.  Gets HD in [place] on TTS schedule. ESRD was due to DM.    Marland Kitchen Type II diabetes mellitus     etiology of ESRD  . Diabetic blindness   . Hypertension   . MI, old 2006  . Asthma   . Pneumonia     "everytime I take a cold" (05/07/2012)  . Bronchiectasis   . GERD (gastroesophageal reflux disease)   . Stroke 2006    "numb on left side since"  05/07/2012  . On home oxygen therapy     "3L at night mostly; also prn" (05/07/2012)  . Cardiomegaly    Past Surgical History  Procedure Laterality Date  . Av fistula placement Right 09/2006    "used it once" (05/07/2012)  . Appendectomy  ~ 1958  . Insertion of dialysis catheter Left 09/2006    "chest" (05/07/2012)  . Total hip arthroplasty Left 1997  . Repair ankle ligament Left 1981  . Cataract extraction w/ intraocular lens implant Left ?2012   Family History  Problem Relation  Age of Onset  . Diabetes Mellitus II Father    History  Substance Use Topics  . Smoking status: Former Smoker -- 2.00 packs/day for 30 years    Types: Cigarettes    Quit date: 01/02/1984  . Smokeless tobacco: Never Used  . Alcohol Use: Yes     Comment: 05/07/2012 "stopped drinking 1979"    Review of Systems  Eyes: Positive for visual disturbance.       Legally blind  Respiratory: Positive for shortness of breath.   Cardiovascular: Positive for chest pain.  Genitourinary:       Makes little urine  All other systems reviewed and are negative.     Allergies  Cefepime and Penicillins  Home Medications   Prior to Admission medications   Medication Sig Start Date End Date Taking? Authorizing Provider  albuterol (PROVENTIL HFA;VENTOLIN HFA) 108 (90 BASE) MCG/ACT inhaler Inhale into the lungs every 6 (six) hours as needed for wheezing or shortness of breath.   Yes Historical Provider, MD  albuterol (PROVENTIL) (2.5 MG/3ML) 0.083% nebulizer solution Take 2.5 mg by nebulization every 6 (six) hours as needed for wheezing or shortness of breath.   Yes Historical Provider, MD  aspirin EC 325 MG tablet Take 325 mg by mouth daily.   Yes Historical Provider, MD  Brinzolamide-Brimonidine [facility]) 1-0.2 % SUSP Place 1 drop  into both eyes 3 (three) times daily.   Yes Historical Provider, MD  calcium acetate (PHOSLO) 667 MG capsule Take 2,668 mg by mouth 3 (three) times daily with meals.    Yes Historical Provider, MD  calcium-vitamin D (OSCAL WITH D) 500-200 MG-UNIT per tablet Take 1 tablet by mouth daily with breakfast.    Yes Historical Provider, MD  cinacalcet (SENSIPAR) 60 MG tablet Take 60 mg by mouth daily.   Yes Historical Provider, MD  clopidogrel (PLAVIX) 75 MG tablet Take 75 mg by mouth daily with breakfast.   Yes Historical Provider, MD  cyanocobalamin 500 MCG tablet Take 500 mcg by mouth daily.   Yes Historical Provider, MD  docusate sodium (COLACE) 100 MG capsule Take 100 mg by  mouth 2 (two) times daily.   Yes Historical Provider, MD  folic acid-vitamin b complex-vitamin c-selenium-zinc (DIALYVITE) 3 MG TABS tablet Take 1 tablet by mouth daily.   Yes Historical Provider, MD  gabapentin (NEURONTIN) 100 MG capsule Take 100 mg by mouth 3 (three) times daily. 09/02/13  Yes Historical Provider, MD  insulin aspart (NOVOLOG) 100 UNIT/ML injection Inject 1-20 Units into the skin 3 (three) times daily with meals. Per sliding scale 04/22/13  Yes Russella DarAllison L Ellis, NP  insulin glargine (LANTUS) 100 UNIT/ML injection Inject 30 Units into the skin 2 (two) times daily.    Yes Historical Provider, MD  latanoprost (XALATAN) 0.005 % ophthalmic solution Place 1 drop into both eyes at bedtime.   Yes Historical Provider, MD  metoprolol (LOPRESSOR) 50 MG tablet Take 1 tablet (50 mg total) by mouth 3 (three) times daily as needed (as needed for systolic BP > 180). 04/22/13  Yes Lonia BloodJeffrey T McClung, MD  midodrine (PROAMATINE) 10 MG tablet Take 10 mg by mouth See admin instructions. *takes 3 times a day, but only on dialysis days. Does dialysis mwf*   Yes Historical Provider, MD  omeprazole (PRILOSEC) 20 MG capsule Take 20 mg by mouth 2 (two) times daily.    Yes Historical Provider, MD  polyvinyl alcohol (LIQUIFILM TEARS) 1.4 % ophthalmic solution Place 1 drop into both eyes daily as needed for dry eyes.   Yes Historical Provider, MD  pravastatin (PRAVACHOL) 40 MG tablet Take 40 mg by mouth at bedtime.    Yes Historical Provider, MD  rOPINIRole (REQUIP) 0.25 MG tablet Take 0.5 mg by mouth at bedtime.    Yes Historical Provider, MD  timolol (TIMOPTIC) 0.5 % ophthalmic solution Place 1 drop into both eyes daily.   Yes Historical Provider, MD  traMADol (ULTRAM) 50 MG tablet Take 50 mg by mouth every 6 (six) hours as needed for moderate pain.   Yes Historical Provider, MD   BP 174/121  Pulse 94  Temp(Src) 98.2 F (36.8 C) (Oral)  Resp 18  SpO2 94% Physical Exam  Nursing note and vitals  reviewed. Constitutional: He appears well-developed and well-nourished. No distress.  Chronically ill-appearing  HENT:  Head: Normocephalic and atraumatic.  Eyes: Conjunctivae are normal. Pupils are equal, round, and reactive to light.  Neck: Neck supple. No tracheal deviation present. No thyromegaly present.  Cardiovascular: Normal rate and regular rhythm.   No murmur heard. Pulmonary/Chest: Effort normal and breath sounds normal.  Abdominal: Soft. Bowel sounds are normal. He exhibits no distension. There is no tenderness.  Musculoskeletal: Normal range of motion. He exhibits no edema and no tenderness.  Neurological: He is alert. Coordination normal.  Skin: Skin is warm and dry. No rash noted.  Psychiatric: He has  a normal mood and affect.    ED Course  Procedures (including critical care time) Labs Review Labs Reviewed - No data to display  Imaging Review No results found.   EKG Interpretation None      Date: 10/26/2013  844 am  Rate: 90  Rhythm: normal sinus rhythm  QRS Axis: normal  Intervals: PR prolonged  ST/T Wave abnormalities: nonspecific ST changes  Conduction Disutrbances:nonspecific intraventricular conduction delay  Narrative Interpretation:   Old EKG Reviewed: Tracing from 09/17/2013 showed junctional rhythm, right bundle branch block   Date: 10/26/20151250 pm  Rate: 110  Rhythm: sinus tachycardia  QRS Axis: normal  Intervals: PR prolonged  ST/T Wave abnormalities: nonspecific T wave changes  Conduction Disutrbances:right bundle branch block  Narrative Interpretation:   Old EKG Reviewed: Right bundle branch block new over previous tracing  1:30 PM patient denies chest pain.  Chest xray viewed by me Results for orders placed during the hospital encounter of 10/26/13  TROPONIN I      Result Value Ref Range   Troponin I <0.30  <0.30 ng/mL  CBC WITH DIFFERENTIAL      Result Value Ref Range   WBC 15.6 (*) 4.0 - 10.5 K/uL   RBC 3.88 (*) 4.22 - 5.81  MIL/uL   Hemoglobin 11.8 (*) 13.0 - 17.0 g/dL   HCT 16.1 (*) 09.6 - 04.5 %   MCV 94.3  78.0 - 100.0 fL   MCH 30.4  26.0 - 34.0 pg   MCHC 32.2  30.0 - 36.0 g/dL   RDW 40.9  81.1 - 91.4 %   Platelets 263  150 - 400 K/uL   Neutrophils Relative % 84 (*) 43 - 77 %   Neutro Abs 13.1 (*) 1.7 - 7.7 K/uL   Lymphocytes Relative 8 (*) 12 - 46 %   Lymphs Abs 1.2  0.7 - 4.0 K/uL   Monocytes Relative 7  3 - 12 %   Monocytes Absolute 1.1 (*) 0.1 - 1.0 K/uL   Eosinophils Relative 1  0 - 5 %   Eosinophils Absolute 0.1  0.0 - 0.7 K/uL   Basophils Relative 0  0 - 1 %   Basophils Absolute 0.0  0.0 - 0.1 K/uL  BASIC METABOLIC PANEL      Result Value Ref Range   Sodium 134 (*) 137 - 147 mEq/L   Potassium 6.6 (*) 3.7 - 5.3 mEq/L   Chloride 92 (*) 96 - 112 mEq/L   CO2 22  19 - 32 mEq/L   Glucose, Bld 166 (*) 70 - 99 mg/dL   BUN 61 (*) 6 - 23 mg/dL   Creatinine, Ser 7.82 (*) 0.50 - 1.35 mg/dL   Calcium 9.3  8.4 - 95.6 mg/dL   GFR calc non Af Amer 5 (*) >90 mL/min   GFR calc Af Amer 6 (*) >90 mL/min   Anion gap 20 (*) 5 - 15  POTASSIUM      Result Value Ref Range   Potassium 6.9 (*) 3.7 - 5.3 mEq/L   Ct Angio Chest Pe W/cm &/or Wo Cm  10/26/2013   CLINICAL DATA:  Chest pain starting last night at 2000. Had dialysis on Thursday because a point at [place] on Friday. Due for dialysis today at 1100. SOB. Midsternal chest pain upon inspiration and palpation. Went away upon transport then reported abdominal pain then back to chest pain when arrived to facility. Denies cough but states he feels he needs to but scared it will hurt.  EXAM: CT ANGIOGRAPHY CHEST WITH CONTRAST  TECHNIQUE: Multidetector CT imaging of the chest was performed using the standard protocol during bolus administration of intravenous contrast. Multiplanar CT image reconstructions and MIPs were obtained to evaluate the vascular anatomy.  CONTRAST:  OMNIPAQUE IOHEXOL 350 MG/ML SOLN  COMPARISON:  None.  FINDINGS: There is adequate  opacification of the pulmonary arteries. There is no pulmonary embolus. The main pulmonary artery, right main pulmonary artery and left main pulmonary arteries are normal in size. The heart size is normal. There is no pericardial effusion. There is mild thoracic aortic atherosclerosis. There is no thoracic aortic dissection or aneurysm. The superior mesenteric artery and celiac artery are patent with mild atherosclerotic plaque at the origin. The branch vessels arising from knee aortic arch are patent. There is atherosclerotic plaque with stenosis of the proximal right vertebral artery. Coronary artery atherosclerosis involving the LAD, left main, circumflex and right coronary artery.  There are trace bilateral pleural effusions. There is bibasilar mild interstitial thickening.  There are scattered non pathologically enlarged mediastinal lymph nodes.  There is no lytic or blastic osseous lesion.  Bilateral kidneys are atrophic.  The gallbladder is distended.  Review of the MIP images confirms the above findings.  IMPRESSION: 1. No thoracic aortic dissection or aneurysm. 2. No evidence of pulmonary embolus. 3. Trace bilateral pleural effusions.   Electronically Signed   By: Elige Ko   On: 10/26/2013 12:21   Dg Chest Portable 1 View  10/26/2013   CLINICAL DATA:  Chest pain starting last night at 2000 hr, end-stage renal disease on dialysis last performed 4 days ago, shortness of breath, mid sternal chest pain with inspiration and palpation. Personal history CHF, CAD, COPD, diabetes, hypertension, stroke, former smoker  EXAM: PORTABLE CHEST - 1 VIEW  COMPARISON:  Portable exam 0953 hr compared to 04/28/2013  FINDINGS: Significantly rotated to the LEFT.  LEFT jugular central venous catheter tip projects over cavoatrial junction.  Enlargement of cardiac silhouette.  Atherosclerotic calcification aorta.  Pulmonary vascularity normal.  Prominent RIGHT peritracheal soft tissues unchanged, less well evaluated on  rotated exam.  No definite infiltrate, pleural effusion or pneumothorax.  IMPRESSION: Enlargement of cardiac silhouette.  No definite acute abnormalities identified on rotated exam.   Electronically Signed   By: Ulyses Southward M.D.   On: 10/26/2013 10:07    Renal service consulted regarding hyperkalemia. Patient to get dialysis later today.  MDM  Treatment with D50, insulin, calcium gluconate pending emergent hemodialysis. I consulted Dr. Tenny Craw from cardiology who will evaluate patient in consultation. She requests internal medicine service to admit patient Final diagnoses:  None   I spoke with Dr. Jomarie Longs plan admit to stepdown unit.  Patient will get hemodialysis later today  diagnoses #1 chest pain #2 hyperkalemia #3 anemia CRITICAL CARE Performed by: Doug Sou Total critical care time: 30 minute Critical care time was exclusive of separately billable procedures and treating other patients. Critical care was necessary to treat or prevent imminent or life-threatening deterioration. Critical care was time spent personally by me on the following activities: development of treatment plan with patient and/or surrogate as well as nursing, discussions with consultants, evaluation of patient's response to treatment, examination of patient, obtaining history from patient or surrogate, ordering and performing treatments and interventions, ordering and review of laboratory studies, ordering and review of radiographic studies, pulse oximetry and re-evaluation of patient's condition.      Doug Sou, MD 10/26/13 1357

## 2013-10-26 NOTE — ED Notes (Signed)
Pt is alert. Noted to be moaning and grunting. "I just feel bad". Denies any specific pain. Per MD pt to go to dialysis.

## 2013-10-26 NOTE — Consult Note (Signed)
I saw the patient and agree with the above assessment and plan.    65M ESRD [facility] MWF via TDC.  Had med appt on Friday and couldn't attend regular shift.  We tried to arrange 1st shift and couldn't get HD [facility] Sat [facility]. He had 3d gap on HD and poor TDC on Thursday.  Mild hyperkalemia and abd/cp in ED.  CTA negative for PE, PNA.  WBC inc w/ ANC up. Will do HD now to correct K.

## 2013-10-26 NOTE — Procedures (Signed)
I was present at this dialysis session. I have reviewed the session itself and made appropriate changes.  Qb 300, will try to icnrease up.  If not successful will tPA dwell before next Tx.   Sabra Heck  MD 10/26/2013, 3:33 PM

## 2013-10-26 NOTE — ED Notes (Signed)
Ultrasound IV started by RN, unable to thread catheter completely into vein. CT made aware, advised new IV for angio. MD made aware along with PT. Was able to draw blood off IV for labs.

## 2013-10-26 NOTE — ED Notes (Signed)
Dr. Shela Commons performing right groin stick to obtain blood.

## 2013-10-26 NOTE — ED Notes (Signed)
Phlebotomy made aware to repeat potassium. Pt difficult stick.

## 2013-10-26 NOTE — ED Notes (Signed)
IV team RN's at bedside checking catheter flow to dialysis site; reports good flow and blood return. Heparin instilled.

## 2013-10-26 NOTE — Consult Note (Signed)
[facility] Renal Consultation Note    Indication for Consultation:  Management of ESRD/hemodialysis; anemia, hypertension/volume and secondary hyperparathyroidism PCP:  HPI: [name] is a 68 y.o. male with ESRD on MWF dialysis in [place] who presents via EMS with complaints of chest pain (radiating to the neck last nite per ED note) and found to have hyperkalemia K 6.6 - recheck 6.9.  He missed Friday HD but had it 10/21 and 10/22 for 4.25 and 3.75 hrs respectively.  Delivered kt/v on both days were low with 0.64 on 10/22 - BFR on catheter has been reduced at 240 - 300.  (450 is Rx) DFR also low due to Rx of A 1.5  He ambulates minimally at home - some with a walker, but mostly in a WC. - recently IDWG have been ok and he's gotten to edw .  He was recently admitted in September for orthostasis and at that time had a neg stress test which was done due to Type II MI with demand ischemia. No N, V, fever, chills, SOB  Past Medical History  Diagnosis Date  . CHF (congestive heart failure)   . CAD (coronary artery disease) 2006  . COPD (chronic obstructive pulmonary disease)   . Hyperlipidemia   . ESRD (end stage renal disease) on dialysis started 08/2006    Started HD in September 2008.  R arm AVG placed 2008 was ligated 2009 due to steal syndrome.  Gets HD in [place] on TTS schedule. ESRD was due to DM.    Marland Kitchen Type II diabetes mellitus     etiology of ESRD  . Diabetic blindness   . Hypertension   . MI, old 2006  . Asthma   . Pneumonia     "everytime I take a cold" (05/07/2012)  . Bronchiectasis   . GERD (gastroesophageal reflux disease)   . Stroke 2006    "numb on left side since"  05/07/2012  . On home oxygen therapy     "3L at night mostly; also prn" (05/07/2012)  . Cardiomegaly    Past Surgical History  Procedure Laterality Date  . Av fistula placement Right 09/2006    "used it once" (05/07/2012)  . Appendectomy  ~ 1958  . Insertion of dialysis catheter Left 09/2006     "chest" (05/07/2012)  . Total hip arthroplasty Left 1997  . Repair ankle ligament Left 1981  . Cataract extraction w/ intraocular lens implant Left ?2012   Family History  Problem Relation Age of Onset  . Diabetes Mellitus II Father    Social History:  reports that he quit smoking about 29 years ago. His smoking use included Cigarettes. He has a 60 pack-year smoking history. He has never used smokeless tobacco. He reports that he drinks alcohol. He reports that he does not use illicit drugs. Allergies  Allergen Reactions  . Cefepime Itching  . Penicillins Hives and Swelling   Prior to Admission medications   Medication Sig Start Date End Date Taking? Authorizing Provider  albuterol (PROVENTIL HFA;VENTOLIN HFA) 108 (90 BASE) MCG/ACT inhaler Inhale into the lungs every 6 (six) hours as needed for wheezing or shortness of breath.   Yes Historical Provider, MD  albuterol (PROVENTIL) (2.5 MG/3ML) 0.083% nebulizer solution Take 2.5 mg by nebulization every 6 (six) hours as needed for wheezing or shortness of breath.   Yes Historical Provider, MD  aspirin EC 325 MG tablet Take 325 mg by mouth daily.   Yes Historical Provider, MD  Brinzolamide-Brimonidine [facility]) 1-0.2 %  SUSP Place 1 drop into both eyes 3 (three) times daily.   Yes Historical Provider, MD  calcium acetate (PHOSLO) 667 MG capsule Take 2,668 mg by mouth 3 (three) times daily with meals.    Yes Historical Provider, MD  calcium-vitamin D (OSCAL WITH D) 500-200 MG-UNIT per tablet Take 1 tablet by mouth daily with breakfast.    Yes Historical Provider, MD  cinacalcet (SENSIPAR) 60 MG tablet Take 60 mg by mouth daily.   Yes Historical Provider, MD  clopidogrel (PLAVIX) 75 MG tablet Take 75 mg by mouth daily with breakfast.   Yes Historical Provider, MD  cyanocobalamin 500 MCG tablet Take 500 mcg by mouth daily.   Yes Historical Provider, MD  docusate sodium (COLACE) 100 MG capsule Take 100 mg by mouth 2 (two) times daily.   Yes  Historical Provider, MD  folic acid-vitamin b complex-vitamin c-selenium-zinc (DIALYVITE) 3 MG TABS tablet Take 1 tablet by mouth daily.   Yes Historical Provider, MD  gabapentin (NEURONTIN) 100 MG capsule Take 100 mg by mouth 3 (three) times daily. 09/02/13  Yes Historical Provider, MD  insulin aspart (NOVOLOG) 100 UNIT/ML injection Inject 1-20 Units into the skin 3 (three) times daily with meals. Per sliding scale 04/22/13  Yes Russella Dar, NP  insulin glargine (LANTUS) 100 UNIT/ML injection Inject 30 Units into the skin 2 (two) times daily.    Yes Historical Provider, MD  latanoprost (XALATAN) 0.005 % ophthalmic solution Place 1 drop into both eyes at bedtime.   Yes Historical Provider, MD  metoprolol (LOPRESSOR) 50 MG tablet Take 1 tablet (50 mg total) by mouth 3 (three) times daily as needed (as needed for systolic BP > 180). 04/22/13  Yes Lonia Blood, MD  midodrine (PROAMATINE) 10 MG tablet Take 10 mg by mouth See admin instructions. *takes 3 times a day, but only on dialysis days. Does dialysis mwf*   Yes Historical Provider, MD  omeprazole (PRILOSEC) 20 MG capsule Take 20 mg by mouth 2 (two) times daily.    Yes Historical Provider, MD  polyvinyl alcohol (LIQUIFILM TEARS) 1.4 % ophthalmic solution Place 1 drop into both eyes daily as needed for dry eyes.   Yes Historical Provider, MD  pravastatin (PRAVACHOL) 40 MG tablet Take 40 mg by mouth at bedtime.    Yes Historical Provider, MD  rOPINIRole (REQUIP) 0.25 MG tablet Take 0.5 mg by mouth at bedtime.    Yes Historical Provider, MD  timolol (TIMOPTIC) 0.5 % ophthalmic solution Place 1 drop into both eyes daily.   Yes Historical Provider, MD  traMADol (ULTRAM) 50 MG tablet Take 50 mg by mouth every 6 (six) hours as needed for moderate pain.   Yes Historical Provider, MD   Current Facility-Administered Medications  Medication Dose Route Frequency Provider Last Rate Last Dose  . [START ON 10/28/2013] calcitRIOL (ROCALTROL) capsule 2 mcg  2  mcg Oral Q M,W,F-HD Sheffield Slider, PA-C      . Melene Muller ON 10/28/2013] ferric gluconate (NULECIT) 62.5 mg in sodium chloride 0.9 % 100 mL IVPB  62.5 mg Intravenous Q Wed-HD Sheffield Slider, PA-C       Current Outpatient Prescriptions  Medication Sig Dispense Refill  . albuterol (PROVENTIL HFA;VENTOLIN HFA) 108 (90 BASE) MCG/ACT inhaler Inhale into the lungs every 6 (six) hours as needed for wheezing or shortness of breath.      Marland Kitchen albuterol (PROVENTIL) (2.5 MG/3ML) 0.083% nebulizer solution Take 2.5 mg by nebulization every 6 (six) hours as needed for wheezing  or shortness of breath.      Marland Kitchen aspirin EC 325 MG tablet Take 325 mg by mouth daily.      . Brinzolamide-Brimonidine (SIMBRINZA) 1-0.2 % SUSP Place 1 drop into both eyes 3 (three) times daily.      . calcium acetate (PHOSLO) 667 MG capsule Take 2,668 mg by mouth 3 (three) times daily with meals.       . calcium-vitamin D (OSCAL WITH D) 500-200 MG-UNIT per tablet Take 1 tablet by mouth daily with breakfast.       . cinacalcet (SENSIPAR) 60 MG tablet Take 60 mg by mouth daily.      . clopidogrel (PLAVIX) 75 MG tablet Take 75 mg by mouth daily with breakfast.      . cyanocobalamin 500 MCG tablet Take 500 mcg by mouth daily.      Marland Kitchen docusate sodium (COLACE) 100 MG capsule Take 100 mg by mouth 2 (two) times daily.      . folic acid-vitamin b complex-vitamin c-selenium-zinc (DIALYVITE) 3 MG TABS tablet Take 1 tablet by mouth daily.      Marland Kitchen gabapentin (NEURONTIN) 100 MG capsule Take 100 mg by mouth 3 (three) times daily.      . insulin aspart (NOVOLOG) 100 UNIT/ML injection Inject 1-20 Units into the skin 3 (three) times daily with meals. Per sliding scale      . insulin glargine (LANTUS) 100 UNIT/ML injection Inject 30 Units into the skin 2 (two) times daily.       Marland Kitchen latanoprost (XALATAN) 0.005 % ophthalmic solution Place 1 drop into both eyes at bedtime.      . metoprolol (LOPRESSOR) 50 MG tablet Take 1 tablet (50 mg total) by mouth 3 (three)  times daily as needed (as needed for systolic BP > 180).      . midodrine (PROAMATINE) 10 MG tablet Take 10 mg by mouth See admin instructions. *takes 3 times a day, but only on dialysis days. Does dialysis mwf*      . omeprazole (PRILOSEC) 20 MG capsule Take 20 mg by mouth 2 (two) times daily.       . polyvinyl alcohol (LIQUIFILM TEARS) 1.4 % ophthalmic solution Place 1 drop into both eyes daily as needed for dry eyes.      . pravastatin (PRAVACHOL) 40 MG tablet Take 40 mg by mouth at bedtime.       Marland Kitchen rOPINIRole (REQUIP) 0.25 MG tablet Take 0.5 mg by mouth at bedtime.       . timolol (TIMOPTIC) 0.5 % ophthalmic solution Place 1 drop into both eyes daily.      . traMADol (ULTRAM) 50 MG tablet Take 50 mg by mouth every 6 (six) hours as needed for moderate pain.       Labs: Basic Metabolic Panel:  Recent Labs Lab 10/26/13 1000 10/26/13 1219  NA 134*  --   K 6.6* 6.9*  CL 92*  --   CO2 22  --   GLUCOSE 166*  --   BUN 61*  --   CREATININE 9.16*  --   CALCIUM 9.3  --   CBC:  Recent Labs Lab 10/26/13 1000  WBC 15.6*  NEUTROABS 13.1*  HGB 11.8*  HCT 36.6*  MCV 94.3  PLT 263   Cardiac Enzymes:  Recent Labs Lab 10/26/13 1000  TROPONINI <0.30  Studies/Results: Ct Angio Chest Pe W/cm &/or Wo Cm  10/26/2013   CLINICAL DATA:  Chest pain starting last night at 2000. Had dialysis on Thursday  because a point at [facility] on Friday. Due for dialysis today at 1100. SOB. Midsternal chest pain upon inspiration and palpation. Went away upon transport then reported abdominal pain then back to chest pain when arrived to facility. Denies cough but states he feels he needs to but scared it will hurt.  EXAM: CT ANGIOGRAPHY CHEST WITH CONTRAST  TECHNIQUE: Multidetector CT imaging of the chest was performed using the standard protocol during bolus administration of intravenous contrast. Multiplanar CT image reconstructions and MIPs were obtained to evaluate the vascular anatomy.  CONTRAST:  100mL OMNIPAQUE  IOHEXOL 350 MG/ML SOLN  COMPARISON:  None.  FINDINGS: There is adequate opacification of the pulmonary arteries. There is no pulmonary embolus. The main pulmonary artery, right main pulmonary artery and left main pulmonary arteries are normal in size. The heart size is normal. There is no pericardial effusion. There is mild thoracic aortic atherosclerosis. There is no thoracic aortic dissection or aneurysm. The superior mesenteric artery and celiac artery are patent with mild atherosclerotic plaque at the origin. The branch vessels arising from knee aortic arch are patent. There is atherosclerotic plaque with stenosis of the proximal right vertebral artery. Coronary artery atherosclerosis involving the LAD, left main, circumflex and right coronary artery.  There are trace bilateral pleural effusions. There is bibasilar mild interstitial thickening.  There are scattered non pathologically enlarged mediastinal lymph nodes.  There is no lytic or blastic osseous lesion.  Bilateral kidneys are atrophic.  The gallbladder is distended.  Review of the MIP images confirms the above findings.  IMPRESSION: 1. No thoracic aortic dissection or aneurysm. 2. No evidence of pulmonary embolus. 3. Trace bilateral pleural effusions.   Electronically Signed   By: Elige KoHetal  Patel   On: 10/26/2013 12:21   Dg Chest Portable 1 View  10/26/2013   CLINICAL DATA:  Chest pain starting last night at 2000 hr, end-stage renal disease on dialysis last performed 4 days ago, shortness of breath, mid sternal chest pain with inspiration and palpation. Personal history CHF, CAD, COPD, diabetes, hypertension, stroke, former smoker  EXAM: PORTABLE CHEST - 1 VIEW  COMPARISON:  Portable exam 0953 hr compared to 04/28/2013  FINDINGS: Significantly rotated to the LEFT.  LEFT jugular central venous catheter tip projects over cavoatrial junction.  Enlargement of cardiac silhouette.  Atherosclerotic calcification aorta.  Pulmonary vascularity normal.   Prominent RIGHT peritracheal soft tissues unchanged, less well evaluated on rotated exam.  No definite infiltrate, pleural effusion or pneumothorax.  IMPRESSION: Enlargement of cardiac silhouette.  No definite acute abnormalities identified on rotated exam.   Electronically Signed   By: Ulyses SouthwardMark  Boles M.D.   On: 10/26/2013 10:07    ROS: As per HPI otherwise negative. He is a vague historian and falls asleep during conversation. Physical Exam: Filed Vitals:   10/26/13 1052 10/26/13 1308 10/26/13 1315 10/26/13 1318  BP: 185/98 151/97 155/95 155/95  Pulse: 100 111 111   Temp:      TempSrc:      Resp: 18 15 17 18   SpO2: 95% 95% 94% 100%     General: Well developed, well nourished, in no acute distress. Poor hygiene.Lying supine on stretcher on room iar Head: Normocephalic, atraumatic, sclera non-icteric, mucus membranes are moist, blind Neck: Supple. JVD not elevated. Lungs: Clear bilaterally to auscultation without wheezes, rales, or rhonchi. Breathing is unlabored. Heart: tachy reg No murmurs, rubs, or gallops appreciated. Abdomen: Soft, obese mild - mod diffuse tenderness more so on LLQ, No rebound/guarding. M-S:  Strength and tone  appear normal for age. Lower extremities:without edema or ischemic changes, no open wounds though many scratched areas on both shins  Neuro: Alert and oriented X 3. Moves all extremities spontaneously. Psych: .- at times acts out of it and then becomes alert, if you wake him up. Dialysis Access: left IJ - cath exit no erythema or drainage (REFUSES perm access)  Dialysis Orders:  MWF [facility] 4hr 15 min 107kg 2K/2Ca 8000 Heparin L IJ cath 450/1.5  Aranesp none Venofer 50 q week calcitriol 2 Profile 4 Recent labs:Hgb 11.7 iPTH 280   Assessment/Plan:  1. Chest/adominal pain - initial troponin neg - WBC ^ exam now more abdominal pain than cardiac; EKG prolonged PR, NSR; work up per primary - MS seems baseline 2. ESRD -  MWF - with ^ K due to poor catheter flow and  missed HD - though he did have [facility] HD last week - cath has good push/pull pre HD - will start on 1 K bath - check BMP (s/p treated chemically in ED) and use cath flo if needed - follow K serially on HD; due to high volume of pts today will dialyze for 3 hr on 1 K bath and repeat BMP in am 3. Hypertension/volume  - BP higher than usual and tachy - BP is similar to usual pre HD BP - P a little higher (usuall 75 - 105); on metoprolol 50 tid prn SBP > 180 and midodrine 4. Anemia  - Hgb 11.8 - no ESA for now - weekly Fe 5. Metabolic bone disease -  Continue Calcitirol - sensipar, phoslo,  6. Nutrition - renal diet 7. Hx CVA on plavix and asa 325 8. DM - per primary 9. COPD/OSA - hx nonadherent with CPAP  Sheffield Slider, PA-C [facility] [phone] 10/26/2013, 2:12 PM

## 2013-10-26 NOTE — ED Notes (Addendum)
Pt reports chest pain starting last night at 2000. Had dialysis on Thursday because appt at [facility] on Friday. Due for dialysis today at 1100. SOB. Midsternal chest pain upon inspiration and palpation. Went away upon transport then reported abdominal pain then back to chest pain when arrived to facility. Denies cough but states he feels he needs to but scared it will hurt. He took adult aspirin before EMS arrival. Was taken off 3 L Friend at home recently.

## 2013-10-26 NOTE — H&P (Signed)
[facility] History and Physical  DANILO MCGREGOR WJX:[id number] DOB: 15-Nov-1945 DOA: 10/26/2013  Referring physician: EDP PCP: [name], MD   Chief Complaint: chest pain  HPI: [name] is a 68 y.o. male with PMH of ESRD on HD MWF, OSA, COPD, CAD, CVA, DM, Obesity, chronically ill male presents to the ER with the above complaints. He is a very poor historian, and initially in Triage he reported chest pain since last pm, which reported radiated to neck, now denies this and just says that he is tired and reports some dyspnea and vague abd pain. He missed his dialysis on Friday, he's had problems with his HD catheter flow. In ER, noted to have hyperkalemia with K of 6.9 and [facility] consulted for further management   Review of Systems: very poor historian, positives bolded Constitutional:  No weight loss, night sweats, Fevers, chills, fatigue.  HEENT:  No headaches, Difficulty swallowing,Tooth/dental problems,Sore throat,  No sneezing, itching, ear ache, nasal congestion, post nasal drip,  Cardio-vascular:  chest pain, Orthopnea, PND, swelling in lower extremities, anasarca, dizziness, palpitations  GI:  No heartburn, indigestion, abdominal pain, nausea, vomiting, diarrhea, change in bowel habits, loss of appetite  Resp:   shortness of breath with exertion or at rest. No excess mucus, no productive cough, No non-productive cough, No coughing up of blood.No change in color of mucus.No wheezing.No chest wall deformity  Skin:  no rash or lesions.  GU:  no dysuria, change in color of urine, no urgency or frequency. No flank pain.  Musculoskeletal:  No joint pain or swelling. No decreased range of motion. No back pain.  Psych:  No change in mood or affect. No depression or anxiety. No memory loss.   Past Medical History  Diagnosis Date  . CHF (congestive heart failure)   . CAD (coronary artery disease) 2006  . COPD (chronic obstructive pulmonary disease)   .  Hyperlipidemia   . ESRD (end stage renal disease) on dialysis started 08/2006    Started HD in September 2008.  R arm AVG placed 2008 was ligated 2009 due to steal syndrome.  Gets HD in [place] on TTS schedule. ESRD was due to DM.    Marland Kitchen Type II diabetes mellitus     etiology of ESRD  . Diabetic blindness   . Hypertension   . MI, old 2006  . Asthma   . Pneumonia     "everytime I take a cold" (05/07/2012)  . Bronchiectasis   . GERD (gastroesophageal reflux disease)   . Stroke 2006    "numb on left side since"  05/07/2012  . On home oxygen therapy     "3L at night mostly; also prn" (05/07/2012)  . Cardiomegaly    Past Surgical History  Procedure Laterality Date  . Av fistula placement Right 09/2006    "used it once" (05/07/2012)  . Appendectomy  ~ 1958  . Insertion of dialysis catheter Left 09/2006    "chest" (05/07/2012)  . Total hip arthroplasty Left 1997  . Repair ankle ligament Left 1981  . Cataract extraction w/ intraocular lens implant Left ?2012   Social History:  reports that he quit smoking about 29 years ago. His smoking use included Cigarettes. He has a 60 pack-year smoking history. He has never used smokeless tobacco. He reports that he drinks alcohol. He reports that he does not use illicit drugs.  Allergies  Allergen Reactions  . Cefepime Itching  . Penicillins Hives and Swelling    Family History  Problem Relation Age of Onset  . Diabetes Mellitus II Father      Prior to Admission medications   Medication Sig Start Date End Date Taking? Authorizing Provider  albuterol (PROVENTIL HFA;VENTOLIN HFA) 108 (90 BASE) MCG/ACT inhaler Inhale into the lungs every 6 (six) hours as needed for wheezing or shortness of breath.   Yes Historical Provider, MD  albuterol (PROVENTIL) (2.5 MG/3ML) 0.083% nebulizer solution Take 2.5 mg by nebulization every 6 (six) hours as needed for wheezing or shortness of breath.   Yes Historical Provider, MD  aspirin EC 325 MG tablet Take 325 mg by  mouth daily.   Yes Historical Provider, MD  Brinzolamide-Brimonidine [facility]) 1-0.2 % SUSP Place 1 drop into both eyes 3 (three) times daily.   Yes Historical Provider, MD  calcium acetate (PHOSLO) 667 MG capsule Take 2,668 mg by mouth 3 (three) times daily with meals.    Yes Historical Provider, MD  calcium-vitamin D (OSCAL WITH D) 500-200 MG-UNIT per tablet Take 1 tablet by mouth daily with breakfast.    Yes Historical Provider, MD  cinacalcet (SENSIPAR) 60 MG tablet Take 60 mg by mouth daily.   Yes Historical Provider, MD  clopidogrel (PLAVIX) 75 MG tablet Take 75 mg by mouth daily with breakfast.   Yes Historical Provider, MD  cyanocobalamin 500 MCG tablet Take 500 mcg by mouth daily.   Yes Historical Provider, MD  docusate sodium (COLACE) 100 MG capsule Take 100 mg by mouth 2 (two) times daily.   Yes Historical Provider, MD  folic acid-vitamin b complex-vitamin c-selenium-zinc (DIALYVITE) 3 MG TABS tablet Take 1 tablet by mouth daily.   Yes Historical Provider, MD  gabapentin (NEURONTIN) 100 MG capsule Take 100 mg by mouth 3 (three) times daily. 09/02/13  Yes Historical Provider, MD  insulin aspart (NOVOLOG) 100 UNIT/ML injection Inject 1-20 Units into the skin 3 (three) times daily with meals. Per sliding scale 04/22/13  Yes Russella Dar, NP  insulin glargine (LANTUS) 100 UNIT/ML injection Inject 30 Units into the skin 2 (two) times daily.    Yes Historical Provider, MD  latanoprost (XALATAN) 0.005 % ophthalmic solution Place 1 drop into both eyes at bedtime.   Yes Historical Provider, MD  metoprolol (LOPRESSOR) 50 MG tablet Take 1 tablet (50 mg total) by mouth 3 (three) times daily as needed (as needed for systolic BP > 180). 04/22/13  Yes Lonia Blood, MD  midodrine (PROAMATINE) 10 MG tablet Take 10 mg by mouth See admin instructions. *takes 3 times a day, but only on dialysis days. Does dialysis mwf*   Yes Historical Provider, MD  omeprazole (PRILOSEC) 20 MG capsule Take 20 mg by mouth  2 (two) times daily.    Yes Historical Provider, MD  polyvinyl alcohol (LIQUIFILM TEARS) 1.4 % ophthalmic solution Place 1 drop into both eyes daily as needed for dry eyes.   Yes Historical Provider, MD  pravastatin (PRAVACHOL) 40 MG tablet Take 40 mg by mouth at bedtime.    Yes Historical Provider, MD  rOPINIRole (REQUIP) 0.25 MG tablet Take 0.5 mg by mouth at bedtime.    Yes Historical Provider, MD  timolol (TIMOPTIC) 0.5 % ophthalmic solution Place 1 drop into both eyes daily.   Yes Historical Provider, MD  traMADol (ULTRAM) 50 MG tablet Take 50 mg by mouth every 6 (six) hours as needed for moderate pain.   Yes Historical Provider, MD   Physical Exam: Filed Vitals:   10/26/13 1052 10/26/13 1308 10/26/13 1315 10/26/13 1318  BP: 185/98 151/97 155/95 155/95  Pulse: 100 111 111   Temp:      TempSrc:      Resp: 18 15 17 18   SpO2: 95% 95% 94% 100%    Wt Readings from Last 3 Encounters:  09/19/13 100.1 kg (220 lb 10.9 oz)  04/21/13 106.6 kg (235 lb 0.2 oz)  08/31/12 115.5 kg (254 lb 10.1 oz)    General:  Sleepy, chronically ill appearing, no distress Eyes: PERRL, normal lids, irises & conjunctiva ENT: grossly normal lips & tongue Neck: no LAD, masses or thyromegaly Cardiovascular: RRR, no m/r/g. No LE edema. Respiratory: diminished BS at bases Abdomen: soft, nt, nd, Bs present Skin: no rash or induration seen on limited exam Musculoskeletal: bilateral lower ext weakness Psychiatric: sleepy, tired, ill appearing Neurologic: moves all extremities, no localizing signs          Labs on Admission:  Basic Metabolic Panel:  Recent Labs Lab 10/26/13 1000 10/26/13 1219  NA 134*  --   K 6.6* 6.9*  CL 92*  --   CO2 22  --   GLUCOSE 166*  --   BUN 61*  --   CREATININE 9.16*  --   CALCIUM 9.3  --    Liver Function Tests: No results found for this basename: AST, ALT, ALKPHOS, BILITOT, PROT, ALBUMIN,  in the last 168 hours No results found for this basename: LIPASE, AMYLASE,  in  the last 168 hours No results found for this basename: AMMONIA,  in the last 168 hours CBC:  Recent Labs Lab 10/26/13 1000  WBC 15.6*  NEUTROABS 13.1*  HGB 11.8*  HCT 36.6*  MCV 94.3  PLT 263   Cardiac Enzymes:  Recent Labs Lab 10/26/13 1000  TROPONINI <0.30    BNP (last 3 results) No results found for this basename: PROBNP,  in the last 8760 hours CBG: No results found for this basename: GLUCAP,  in the last 168 hours  Radiological Exams on Admission: Ct Angio Chest Pe W/cm &/or Wo Cm  10/26/2013   CLINICAL DATA:  Chest pain starting last night at 2000. [place] Thursday [place]. Due for dialysis today at 1100. SOB. Midsternal chest pain upon inspiration and palpation. Went away upon transport then reported abdominal pain then back to chest pain when arrived to facility. Denies cough but states he feels he needs to but scared it will hurt.  EXAM: CT ANGIOGRAPHY CHEST WITH CONTRAST  TECHNIQUE: Multidetector CT imaging of the chest was performed using the standard protocol during bolus administration of intravenous contrast. Multiplanar CT image reconstructions and MIPs were obtained to evaluate the vascular anatomy.  CONTRAST:  100mL OMNIPAQUE IOHEXOL 350 MG/ML SOLN  COMPARISON:  None.  FINDINGS: There is adequate opacification of the pulmonary arteries. There is no pulmonary embolus. The main pulmonary artery, right main pulmonary artery and left main pulmonary arteries are normal in size. The heart size is normal. There is no pericardial effusion. There is mild thoracic aortic atherosclerosis. There is no thoracic aortic dissection or aneurysm. The superior mesenteric artery and celiac artery are patent with mild atherosclerotic plaque at the origin. The branch vessels arising from knee aortic arch are patent. There is atherosclerotic plaque with stenosis of the proximal right vertebral artery. Coronary artery atherosclerosis involving the LAD, left  main, circumflex and right coronary artery.  There are trace bilateral pleural effusions. There is bibasilar mild interstitial thickening.  There are scattered non pathologically enlarged mediastinal  lymph nodes.  There is no lytic or blastic osseous lesion.  Bilateral kidneys are atrophic.  The gallbladder is distended.  Review of the MIP images confirms the above findings.  IMPRESSION: 1. No thoracic aortic dissection or aneurysm. 2. No evidence of pulmonary embolus. 3. Trace bilateral pleural effusions.   Electronically Signed   By: Elige Ko   On: 10/26/2013 12:21   Dg Chest Portable 1 View  10/26/2013   CLINICAL DATA:  Chest pain starting last night at 2000 hr, end-stage renal disease on dialysis last performed 4 days ago, shortness of breath, mid sternal chest pain with inspiration and palpation. Personal history CHF, CAD, COPD, diabetes, hypertension, stroke, former smoker  EXAM: PORTABLE CHEST - 1 VIEW  COMPARISON:  Portable exam 0953 hr compared to 04/28/2013  FINDINGS: Significantly rotated to the LEFT.  LEFT jugular central venous catheter tip projects over cavoatrial junction.  Enlargement of cardiac silhouette.  Atherosclerotic calcification aorta.  Pulmonary vascularity normal.  Prominent RIGHT peritracheal soft tissues unchanged, less well evaluated on rotated exam.  No definite infiltrate, pleural effusion or pneumothorax.  IMPRESSION: Enlargement of cardiac silhouette.  No definite acute abnormalities identified on rotated exam.   Electronically Signed   By: Ulyses Southward M.D.   On: 10/26/2013 10:07    EKG: Independently reviewed. NSR, no acute ST t wave changes  Assessment/Plan    Chest pain at rest  -now resolved  -likely due to volume overload, CTA negative for PE  -will cycle cardiac enzymes  -had a stress test 9/15 which did not show any inducible ischemia    Hyperkalemia - given CA gluconate, Insulin, dextrose in ER - HD to arranged by Renal immediately and access issues  per Renal    ESRD (end stage renal disease) on dialysis - HD per Renal    DM2 (diabetes mellitus, type 2) - Resume Lantus, SSI    CAD- non obstructive 2011  - myoview 9/15 normal  -continue ASA/plavix/toprol   H/o CVA -continue Plavix/ASA    HTN (hypertension) -stable, continue metoprolol -gets midodrine on HD days     COPD (chronic obstructive pulmonary disease) -stable, nebs PRN    OSA (obstructive sleep apnea) -does not use CPAP, used to use O2 QHS but reportedly doesn't use this anymore   Code Status: Full CODE DVT Prophylaxis: lovenox Family Communication: none at bedside Disposition Plan: Inpatient  Time spent:  [facility] Pager [phone]

## 2013-10-26 NOTE — ED Notes (Signed)
IV team called per nephrology for cath flo for catheter.

## 2013-10-26 NOTE — ED Notes (Signed)
IV team at bedside 

## 2013-10-27 ENCOUNTER — Encounter (HOSPITAL_COMMUNITY): Payer: Self-pay | Admitting: *Deleted

## 2013-10-27 DIAGNOSIS — R52 Pain, unspecified: Secondary | ICD-10-CM

## 2013-10-27 LAB — TROPONIN I
Troponin I: <0.3 ng/mL (ref <0.30)
Troponin I: <0.3 ng/mL (ref <0.30)

## 2013-10-27 LAB — COMPREHENSIVE METABOLIC PANEL
ALK PHOS: 87 U/L (ref 39–117)
ALT: 9 U/L (ref 0–53)
AST: 8 U/L (ref 0–37)
Albumin: 2.6 g/dL — ABNORMAL LOW (ref 3.5–5.2)
Anion gap: 18 — ABNORMAL HIGH (ref 5–15)
BILIRUBIN TOTAL: 0.5 mg/dL (ref 0.3–1.2)
BUN: 39 mg/dL — ABNORMAL HIGH (ref 6–23)
CHLORIDE: 98 meq/L (ref 96–112)
CO2: 23 mEq/L (ref 19–32)
Calcium: 9.6 mg/dL (ref 8.4–10.5)
Creatinine, Ser: 6.48 mg/dL — ABNORMAL HIGH (ref 0.50–1.35)
GFR calc non Af Amer: 8 mL/min — ABNORMAL LOW (ref >90)
GFR, EST AFRICAN AMERICAN: 9 mL/min — AB (ref >90)
GLUCOSE: 153 mg/dL — AB (ref 70–99)
POTASSIUM: 4.8 meq/L (ref 3.7–5.3)
Sodium: 139 mEq/L (ref 137–147)
TOTAL PROTEIN: 6.2 g/dL (ref 6.0–8.3)

## 2013-10-27 LAB — GLUCOSE, CAPILLARY
GLUCOSE-CAPILLARY: 140 mg/dL — AB (ref 70–99)
GLUCOSE-CAPILLARY: 147 mg/dL — AB (ref 70–99)
GLUCOSE-CAPILLARY: 84 mg/dL (ref 70–99)
Glucose-Capillary: 155 mg/dL — ABNORMAL HIGH (ref 70–99)
Glucose-Capillary: 106 mg/dL — ABNORMAL HIGH (ref 70–99)

## 2013-10-27 LAB — CBC
HCT: 32.3 % — ABNORMAL LOW (ref 39.0–52.0)
Hemoglobin: 10.3 g/dL — ABNORMAL LOW (ref 13.0–17.0)
MCH: 30.3 pg (ref 26.0–34.0)
MCHC: 31.9 g/dL (ref 30.0–36.0)
MCV: 95 fL (ref 78.0–100.0)
Platelets: 183 10*3/uL (ref 150–400)
RBC: 3.4 MIL/uL — ABNORMAL LOW (ref 4.22–5.81)
RDW: 13.1 % (ref 11.5–15.5)
WBC: 11 10*3/uL — ABNORMAL HIGH (ref 4.0–10.5)

## 2013-10-27 LAB — MRSA PCR SCREENING: MRSA by PCR: NEGATIVE

## 2013-10-27 MED ORDER — PENTAFLUOROPROP-TETRAFLUOROETH EX AERO: 1.0000 "application " | INHALATION_SPRAY | CUTANEOUS | Status: DC | PRN

## 2013-10-27 MED ORDER — HEPARIN SODIUM (PORCINE) 1000 UNIT/ML DIALYSIS
20.0000 [IU]/kg | INTRAMUSCULAR | Status: DC | PRN
  Filled 2013-10-27: qty 3

## 2013-10-27 MED ORDER — LIDOCAINE-PRILOCAINE 2.5-2.5 % EX CREA: 1.0000 "application " | TOPICAL_CREAM | CUTANEOUS | Status: DC | PRN

## 2013-10-27 MED ORDER — SODIUM CHLORIDE 0.9 % IV SOLN: 100.0000 mL | INTRAVENOUS | Status: DC | PRN

## 2013-10-27 MED ORDER — HEPARIN SODIUM (PORCINE) 1000 UNIT/ML DIALYSIS
1000.0000 [IU] | INTRAMUSCULAR | Status: DC | PRN
  Filled 2013-10-27: qty 1

## 2013-10-27 MED ORDER — LIDOCAINE HCL (PF) 1 % IJ SOLN: 5.0000 mL | INTRAMUSCULAR | Status: DC | PRN

## 2013-10-27 MED ORDER — ALTEPLASE 2 MG IJ SOLR
2.0000 mg | Freq: Once | INTRAMUSCULAR | Status: DC | PRN
  Filled 2013-10-27: qty 2

## 2013-10-27 NOTE — Progress Notes (Signed)
Called to room to perform an ABG.  Patient refused the procedure.  RN aware.

## 2013-10-27 NOTE — Progress Notes (Signed)
[facility] Progress Note  Assessment/Plan: 1. Chest/adominal pain -  Better today troponins neg - WBC ^ cardiac; neg stress test 09/2013 - MS seems baseline; pt wishes "do everything" when queried about DNR wishes 2. ESRD - MWF - with hyperkalemia due to poor catheter flow and missed HD - though he did have [facility] HD last week - cath has good push/pull pre HD - dialyzed on 1 K for 1.5 hr then changed to 2 K ..cath flows not too bad at 350 - K today 4.8; - outpt HD staff needs to be vigilant about suboptimal BFRs. - will discuss with them at discharge - use cath flo prn 3. Hypertension/volume - BP higher than usual and tachy - BP is similar to usual pre HD BP - P a little higher (usuall 75 - 105); on metoprolol 50 tid prn SBP > 180 and midodrine; suspect he rarely gets metoprolol - BP today 90s - 119 - net UF 1 L yesterday; weights here 5 kg above - edw - I suspect this is scale variance - doesn't seem particularly overloaded - sats ok on room air 4. Anemia - Hgb 11.8> 10.3 - no ESA yet - follow - weekly Fe 5. Metabolic bone disease - Continue Calcitirol - sensipar, phoslo,  6. Nutrition - renal diet 7. Hx CVA on plavix and asa 325 8. DM - per primary 9. COPD/OSA - hx nonadherent with CPAP 10. DIsp - seems baseline to me; had leukocytosis yeserday 15 K down to 11s today - tmax 99.2 yesterday - no antibioitics given - he is high risk due to having a dialysis catheter- plan d/c today  Sheffield SliderMartha B Milliani Herrada, PA-C [facility] Beeper [phone] 10/27/2013,10:25 AM  LOS: 1 day   Subjective:   Doesn't remember having dialysis here yesterday . "I must have been out of it."  Objective Filed Vitals:   10/26/13 1907 10/26/13 2047 10/27/13 0400 10/27/13 0900  BP: 108/59 119/79 104/71 98/52  Pulse: 51 118 104 101  Temp: 97.7 F (36.5 C) 99.2 F (37.3 C) 98.2 F (36.8 C) 98.2 F (36.8 C)  TempSrc: Oral Oral Oral Oral  Resp: 18 20 18 18   Weight:  112.3 kg (247 lb 9.2 oz)     SpO2: 95% 100% 96% 96%   Physical Exam General: NAD, minimal vision Heart: tachy reg Lungs: no rales, wheezes, poor effort Abdomen: soft Extremities: no sig edema Dialysis Access: left IJ  Dialysis Orders: MWF [facility] 4hr 15 min 107kg 2K/2Ca 8000 Heparin L IJ cath 450/1.5  Aranesp none Venofer 50 q week calcitriol 2 Profile 4  Recent labs:Hgb 11.7 iPTH 280    Additional Objective Labs: Basic Metabolic Panel:  Recent Labs Lab 10/26/13 1000  10/26/13 1550 10/26/13 1639 10/27/13 0743  NA 134*  --  139 140 139  K 6.6*  < > 4.9 4.0 4.8  CL 92*  --  98 101 98  CO2 22  --  25  --  23  GLUCOSE 166*  --  160* 104* 153*  BUN 61*  --  48* 37* 39*  CREATININE 9.16*  --  7.26* 6.00* 6.48*  CALCIUM 9.3  --  9.0  --  9.6  < > = values in this interval not displayed. Liver Function Tests:  Recent Labs Lab 10/27/13 0743  AST 8  ALT 9  ALKPHOS 87  BILITOT 0.5  PROT 6.2  ALBUMIN 2.6*   CBC:  Recent Labs Lab 10/26/13 1000 10/26/13 1639 10/27/13 0743  WBC 15.6*  --  11.0*  NEUTROABS 13.1*  --   --   HGB 11.8* 11.6* 10.3*  HCT 36.6* 34.0* 32.3*  MCV 94.3  --  95.0  PLT 263  --  183  Cardiac Enzymes:  Recent Labs Lab 10/26/13 1000 10/27/13 0052 10/27/13 0743  TROPONINI <0.30 <0.30 <0.30   CBG:  Recent Labs Lab 10/26/13 1903 10/26/13 2051 10/27/13 0111 10/27/13 0808  GLUCAP 130* 125* 140* 155*   IStudies/Results: Ct Angio Chest Pe W/cm &/or Wo Cm  10/26/2013   CLINICAL DATA:  Chest pain starting last night at 2000. Had dialysis on Thursday because a point at [place] on Friday. Due for dialysis today at 1100. SOB. Midsternal chest pain upon inspiration and palpation. Went away upon transport then reported abdominal pain then back to chest pain when arrived to facility. Denies cough but states he feels he needs to but scared it will hurt.  EXAM: CT ANGIOGRAPHY CHEST WITH CONTRAST  TECHNIQUE: Multidetector CT imaging of the chest was performed using the standard  protocol during bolus administration of intravenous contrast. Multiplanar CT image reconstructions and MIPs were obtained to evaluate the vascular anatomy.  CONTRAST:  OMNIPAQUE IOHEXOL 350 MG/ML SOLN  COMPARISON:  None.  FINDINGS: There is adequate opacification of the pulmonary arteries. There is no pulmonary embolus. The main pulmonary artery, right main pulmonary artery and left main pulmonary arteries are normal in size. The heart size is normal. There is no pericardial effusion. There is mild thoracic aortic atherosclerosis. There is no thoracic aortic dissection or aneurysm. The superior mesenteric artery and celiac artery are patent with mild atherosclerotic plaque at the origin. The branch vessels arising from knee aortic arch are patent. There is atherosclerotic plaque with stenosis of the proximal right vertebral artery. Coronary artery atherosclerosis involving the LAD, left main, circumflex and right coronary artery.  There are trace bilateral pleural effusions. There is bibasilar mild interstitial thickening.  There are scattered non pathologically enlarged mediastinal lymph nodes.  There is no lytic or blastic osseous lesion.  Bilateral kidneys are atrophic.  The gallbladder is distended.  Review of the MIP images confirms the above findings.  IMPRESSION: 1. No thoracic aortic dissection or aneurysm. 2. No evidence of pulmonary embolus. 3. Trace bilateral pleural effusions.   Electronically Signed   By: Elige Ko   On: 10/26/2013 12:21   Dg Chest Portable 1 View  10/26/2013   CLINICAL DATA:  Chest pain starting last night at 2000 hr, end-stage renal disease on dialysis last performed 4 days ago, shortness of breath, mid sternal chest pain with inspiration and palpation. Personal history CHF, CAD, COPD, diabetes, hypertension, stroke, former smoker  EXAM: PORTABLE CHEST - 1 VIEW  COMPARISON:  Portable exam 0953 hr compared to 04/28/2013  FINDINGS: Significantly rotated to the LEFT.  LEFT  jugular central venous catheter tip projects over cavoatrial junction.  Enlargement of cardiac silhouette.  Atherosclerotic calcification aorta.  Pulmonary vascularity normal.  Prominent RIGHT peritracheal soft tissues unchanged, less well evaluated on rotated exam.  No definite infiltrate, pleural effusion or pneumothorax.  IMPRESSION: Enlargement of cardiac silhouette.  No definite acute abnormalities identified on rotated exam.   Electronically Signed   By: Ulyses Southward M.D.   On: 10/26/2013 10:07   Medications:   . aspirin EC  325 mg Oral Daily  . [START ON 10/28/2013] calcitRIOL  2 mcg Oral Q M,W,F-HD  . calcium acetate  2,668 mg Oral TID WC  . calcium-vitamin D  1 tablet Oral Q breakfast  . cinacalcet  60 mg Oral Q breakfast  . clopidogrel  75 mg Oral Q breakfast  . cyanocobalamin  500 mcg Oral Daily  . docusate sodium  100 mg Oral BID  . enoxaparin (LOVENOX) injection  30 mg Subcutaneous Q24H  . [START ON 10/28/2013] ferric gluconate (FERRLECIT/NULECIT) IV  62.5 mg Intravenous Q Wed-HD  . gabapentin  100 mg Oral TID  . insulin aspart  0-9 Units Subcutaneous TID WC  . insulin glargine  30 Units Subcutaneous BID  . latanoprost  1 drop Both Eyes QHS  . [START ON 10/28/2013] midodrine  10 mg Oral Q M,W,F  . pantoprazole  40 mg Oral Daily  . pravastatin  40 mg Oral QHS  . rOPINIRole  0.5 mg Oral QHS  . timolol  1 drop Both Eyes Daily

## 2013-10-27 NOTE — Progress Notes (Signed)
Patient refused to be in recliner and ambulation.

## 2013-10-27 NOTE — Progress Notes (Signed)
I saw the patient and agree with the above assessment and plan.    HD yesterday.  K improved.  Having some diarrhea, a chronic issue for him.  Looks back to his outpt baseline.  OK with DC today from renal viewpoint.

## 2013-10-27 NOTE — Progress Notes (Signed)
Patient Demographics  [name], is a 68 y.o. male, DOB - 08-02-1945, XKG:[id number]  Admit date - 10/26/2013   Admitting Physician Zannie Cove, MD  Outpatient Primary MD for the patient is Dagoberto Taylor., MD  LOS - 1   Chief Complaint  Patient presents with  . Chest Pain        Subjective:   [name] today has, No headache, No chest pain, No abdominal pain - No Nausea, No Cough - SOB. Had diarrhea overnight, complaints of generalized weakness.  Assessment & Plan    Principal Problem:   Chest pain at rest Active Problems:   ESRD (end stage renal disease) on dialysis   DM2 (diabetes mellitus, type 2)   CAD- non obstructive 2011   HTN (hypertension)   COPD (chronic obstructive pulmonary disease)   OSA (obstructive sleep apnea)   Aortic stenosis- mild to moderate with normal LVF   Hyperkalemia  Chest pain at rest  -now resolved  -likely due to volume overload, CTA negative for PE  -Negative cardiac enzymes -had a stress test 9/15 which did not show any inducible ischemia  -Atypical, musculoskeletal, reproducible by palpation.  Hyperkalemia  - Corrected with hemodialysis  ESRD (end stage renal disease) on dialysis  - HD per Renal   DM2 (diabetes mellitus, type 2)  - Resume Lantus, SSI   CAD- non obstructive 2011  - myoview 9/15 normal  -continue ASA/plavix/toprol   H/o CVA  -continue Plavix/ASA   HTN (hypertension)  -stable, continue metoprolol  -gets midodrine on HD days   COPD (chronic obstructive pulmonary disease)  -stable, nebs PRN   OSA (obstructive sleep apnea)  -does not use CPAP, used to use O2 QHS but reportedly doesn't use this anymore   Diarrhea  -Check C. difficile    Code Status: Full  Family Communication: None at bedside  Disposition Plan: PT consult   Procedures  Hemodialysis  10/26   Consults   Nephrology   Medications  Scheduled Meds: . aspirin EC  325 mg Oral Daily  . [START ON 10/28/2013] calcitRIOL  2 mcg Oral Q M,W,F-HD  . calcium acetate  2,668 mg Oral TID WC  . calcium-vitamin D  1 tablet Oral Q breakfast  . cinacalcet  60 mg Oral Q breakfast  . clopidogrel  75 mg Oral Q breakfast  . cyanocobalamin  500 mcg Oral Daily  . docusate sodium  100 mg Oral BID  . enoxaparin (LOVENOX) injection  30 mg Subcutaneous Q24H  . [START ON 10/28/2013] ferric gluconate (FERRLECIT/NULECIT) IV  62.5 mg Intravenous Q Wed-HD  . gabapentin  100 mg Oral TID  . insulin aspart  0-9 Units Subcutaneous TID WC  . insulin glargine  30 Units Subcutaneous BID  . latanoprost  1 drop Both Eyes QHS  . [START ON 10/28/2013] midodrine  10 mg Oral Q M,W,F  . pantoprazole  40 mg Oral Daily  . pravastatin  40 mg Oral QHS  . rOPINIRole  0.5 mg Oral QHS  . timolol  1 drop Both Eyes Daily   Continuous Infusions:  PRN Meds:.sodium chloride, sodium chloride, acetaminophen, acetaminophen, albuterol, alteplase, feeding supplement (NEPRO CARB STEADY), heparin, heparin, lidocaine (PF), lidocaine-prilocaine, metoprolol, ondansetron (ZOFRAN) IV, ondansetron, pentafluoroprop-tetrafluoroeth, polyvinyl alcohol, traMADol  DVT Prophylaxis  Lovenox - SCDs   Lab Results  Component Value Date   PLT 183 10/27/2013    Antibiotics   Anti-infectives   None          Objective:   Filed Vitals:   10/26/13 1907 10/26/13 2047 10/27/13 0400 10/27/13 0900  BP: 108/59 119/79 104/71 98/52  Pulse: 51 118 104 101  Temp: 97.7 F (36.5 C) 99.2 F (37.3 C) 98.2 F (36.8 C) 98.2 F (36.8 C)  TempSrc: Oral Oral Oral Oral  Resp: 18 20 18 18   Weight:  112.3 kg (247 lb 9.2 oz)    SpO2: 95% 100% 96% 96%    Wt Readings from Last 3 Encounters:  10/26/13 112.3 kg (247 lb 9.2 oz)  09/19/13 100.1 kg (220 lb 10.9 oz)  04/21/13 106.6 kg (235 lb 0.2 oz)     Intake/Output Summary (Last 24 hours)  at 10/27/13 1121 Last data filed at 10/27/13 0900  Gross per 24 hour  Intake    120 ml  Output   1000 ml  Net   -880 ml     Physical Exam  Awake Alert, Oriented X 3, No new F.N deficits, sleepy [place].AT,PERRAL Supple Neck,No JVD, No cervical lymphadenopathy appriciated.  Symmetrical Chest wall movement, Good air movement bilaterally, CTAB RRR,No Gallops,Rubs or new Murmurs, No Parasternal Heave +ve B.Sounds, Abd Soft, No tenderness, No organomegaly appriciated, No rebound - guarding or rigidity. No Cyanosis, Clubbing or edema, No new Rash or bruise     Data Review   Micro Results Recent Results (from the past 240 hour(s))  MRSA PCR SCREENING     Status: None   Collection Time    10/27/13  6:26 AM      Result Value Ref Range Status   MRSA by PCR NEGATIVE  NEGATIVE Final   Comment:            The GeneXpert MRSA Assay (FDA     approved for NASAL specimens     only), is one component of a     comprehensive MRSA colonization     surveillance program. It is not     intended to diagnose MRSA     infection nor to guide or     monitor treatment for     MRSA infections.    Radiology Reports Ct Angio Chest Pe W/cm &/or Wo Cm  10/26/2013   CLINICAL DATA:  Chest pain starting last night at 2000. Had dialysis on Thursday because a point at [place] on Friday. Due for dialysis today at 1100. SOB. Midsternal chest pain upon inspiration and palpation. Went away upon transport then reported abdominal pain then back to chest pain when arrived to facility. Denies cough but states he feels he needs to but scared it will hurt.  EXAM: CT ANGIOGRAPHY CHEST WITH CONTRAST  TECHNIQUE: Multidetector CT imaging of the chest was performed using the standard protocol during bolus administration of intravenous contrast. Multiplanar CT image reconstructions and MIPs were obtained to evaluate the vascular anatomy.  CONTRAST:  OMNIPAQUE IOHEXOL 350 MG/ML SOLN  COMPARISON:  None.  FINDINGS: There is adequate  opacification of the pulmonary arteries. There is no pulmonary embolus. The main pulmonary artery, right main pulmonary artery and left main pulmonary arteries are normal in size. The heart size is normal. There is no pericardial effusion. There is mild thoracic aortic atherosclerosis. There is no thoracic aortic dissection or aneurysm. The superior mesenteric artery and celiac artery are patent with mild  atherosclerotic plaque at the origin. The branch vessels arising from knee aortic arch are patent. There is atherosclerotic plaque with stenosis of the proximal right vertebral artery. Coronary artery atherosclerosis involving the LAD, left main, circumflex and right coronary artery.  There are trace bilateral pleural effusions. There is bibasilar mild interstitial thickening.  There are scattered non pathologically enlarged mediastinal lymph nodes.  There is no lytic or blastic osseous lesion.  Bilateral kidneys are atrophic.  The gallbladder is distended.  Review of the MIP images confirms the above findings.  IMPRESSION: 1. No thoracic aortic dissection or aneurysm. 2. No evidence of pulmonary embolus. 3. Trace bilateral pleural effusions.   Electronically Signed   By: Elige Ko   On: 10/26/2013 12:21   Dg Chest Portable 1 View  10/26/2013   CLINICAL DATA:  Chest pain starting last night at 2000 hr, end-stage renal disease on dialysis last performed 4 days ago, shortness of breath, mid sternal chest pain with inspiration and palpation. Personal history CHF, CAD, COPD, diabetes, hypertension, stroke, former smoker  EXAM: PORTABLE CHEST - 1 VIEW  COMPARISON:  Portable exam 0953 hr compared to 04/28/2013  FINDINGS: Significantly rotated to the LEFT.  LEFT jugular central venous catheter tip projects over cavoatrial junction.  Enlargement of cardiac silhouette.  Atherosclerotic calcification aorta.  Pulmonary vascularity normal.  Prominent RIGHT peritracheal soft tissues unchanged, less well evaluated on  rotated exam.  No definite infiltrate, pleural effusion or pneumothorax.  IMPRESSION: Enlargement of cardiac silhouette.  No definite acute abnormalities identified on rotated exam.   Electronically Signed   By: Ulyses Southward M.D.   On: 10/26/2013 10:07    CBC  Recent Labs Lab 10/26/13 1000 10/26/13 1639 10/27/13 0743  WBC 15.6*  --  11.0*  HGB 11.8* 11.6* 10.3*  HCT 36.6* 34.0* 32.3*  PLT 263  --  183  MCV 94.3  --  95.0  MCH 30.4  --  30.3  MCHC 32.2  --  31.9  RDW 12.8  --  13.1  LYMPHSABS 1.2  --   --   MONOABS 1.1*  --   --   EOSABS 0.1  --   --   BASOSABS 0.0  --   --     Chemistries   Recent Labs Lab 10/26/13 1000 10/26/13 1219 10/26/13 1550 10/26/13 1639 10/27/13 0743  NA 134*  --  139 140 139  K 6.6* 6.9* 4.9 4.0 4.8  CL 92*  --  98 101 98  CO2 22  --  25  --  23  GLUCOSE 166*  --  160* 104* 153*  BUN 61*  --  48* 37* 39*  CREATININE 9.16*  --  7.26* 6.00* 6.48*  CALCIUM 9.3  --  9.0  --  9.6  AST  --   --   --   --  8  ALT  --   --   --   --  9  ALKPHOS  --   --   --   --  87  BILITOT  --   --   --   --  0.5   ------------------------------------------------------------------------------------------------------------------ CrCl is unknown because both a height and weight (above a minimum accepted value) are required for this calculation. ------------------------------------------------------------------------------------------------------------------ No results found for this basename: HGBA1C,  in the last 72 hours ------------------------------------------------------------------------------------------------------------------ No results found for this basename: CHOL, HDL, LDLCALC, TRIG, CHOLHDL, LDLDIRECT,  in the last 72 hours ------------------------------------------------------------------------------------------------------------------ No results found for this basename: TSH, T4TOTAL,  FREET3, T3FREE, THYROIDAB,  in the last 72  hours ------------------------------------------------------------------------------------------------------------------ No results found for this basename: VITAMINB12, FOLATE, FERRITIN, TIBC, IRON, RETICCTPCT,  in the last 72 hours  Coagulation profile No results found for this basename: INR, PROTIME,  in the last 168 hours  No results found for this basename: DDIMER,  in the last 72 hours  Cardiac Enzymes  Recent Labs Lab 10/26/13 1000 10/27/13 0052 10/27/13 0743  TROPONINI <0.30 <0.30 <0.30   ------------------------------------------------------------------------------------------------------------------ No components found with this basename: POCBNP,      Time Spent in minutes   30 minutes   Magdalynn Davilla M.D on 10/27/2013 at 11:21 AM  Between 7am to 7pm - Pager - [phone]  After 7pm go to [email or web address] - password [email or web address]  And look for the night coverage person covering for me after hours  [facility] Office  [phone]   **Disclaimer: This note may have been dictated with voice recognition software. Similar sounding words can inadvertently be transcribed and this note may contain transcription errors which may not have been corrected upon publication of note.**

## 2013-10-28 LAB — CBC
HEMATOCRIT: 31.7 % — AB (ref 39.0–52.0)
Hemoglobin: 10 g/dL — ABNORMAL LOW (ref 13.0–17.0)
MCH: 30.8 pg (ref 26.0–34.0)
MCHC: 31.5 g/dL (ref 30.0–36.0)
MCV: 97.5 fL (ref 78.0–100.0)
Platelets: 245 10*3/uL (ref 150–400)
RBC: 3.25 MIL/uL — ABNORMAL LOW (ref 4.22–5.81)
RDW: 13.1 % (ref 11.5–15.5)
WBC: 10.7 10*3/uL — AB (ref 4.0–10.5)

## 2013-10-28 LAB — GLUCOSE, CAPILLARY
Glucose-Capillary: 114 mg/dL — ABNORMAL HIGH (ref 70–99)
Glucose-Capillary: 72 mg/dL (ref 70–99)
Glucose-Capillary: 86 mg/dL (ref 70–99)
Glucose-Capillary: 154 mg/dL — ABNORMAL HIGH (ref 70–99)

## 2013-10-28 LAB — BASIC METABOLIC PANEL
ANION GAP: 17 — AB (ref 5–15)
BUN: 48 mg/dL — AB (ref 6–23)
CO2: 25 mEq/L (ref 19–32)
Calcium: 9.5 mg/dL (ref 8.4–10.5)
Chloride: 98 mEq/L (ref 96–112)
Creatinine, Ser: 7.98 mg/dL — ABNORMAL HIGH (ref 0.50–1.35)
GFR calc Af Amer: 7 mL/min — ABNORMAL LOW (ref >90)
GFR, EST NON AFRICAN AMERICAN: 6 mL/min — AB (ref >90)
Glucose, Bld: 44 mg/dL — CL (ref 70–99)
Potassium: 4.6 mEq/L (ref 3.7–5.3)
Sodium: 140 mEq/L (ref 137–147)

## 2013-10-28 LAB — PHOSPHORUS: PHOSPHORUS: 6.6 mg/dL — AB (ref 2.3–4.6)

## 2013-10-28 LAB — MAGNESIUM: Magnesium: 1.6 mg/dL (ref 1.5–2.5)

## 2013-10-28 MED ORDER — SODIUM CHLORIDE 0.9 % IV SOLN: 100.0000 mL | INTRAVENOUS | Status: DC | PRN

## 2013-10-28 MED ORDER — INSULIN GLARGINE 100 UNIT/ML ~~LOC~~ SOLN: 20.0000 [IU] | Freq: Every day | SUBCUTANEOUS | Status: DC

## 2013-10-28 MED ORDER — PENTAFLUOROPROP-TETRAFLUOROETH EX AERO: 1.0000 "application " | INHALATION_SPRAY | CUTANEOUS | Status: DC | PRN

## 2013-10-28 MED ORDER — LIDOCAINE-PRILOCAINE 2.5-2.5 % EX CREA
1.0000 "application " | TOPICAL_CREAM | CUTANEOUS | Status: DC | PRN
  Filled 2013-10-28: qty 5

## 2013-10-28 MED ORDER — NEPRO/CARBSTEADY PO LIQD
237.0000 mL | ORAL | Status: DC | PRN
  Filled 2013-10-28: qty 237

## 2013-10-28 MED ORDER — LIDOCAINE HCL (PF) 1 % IJ SOLN: 5.0000 mL | INTRAMUSCULAR | Status: DC | PRN

## 2013-10-28 MED ORDER — INSULIN GLARGINE 100 UNIT/ML ~~LOC~~ SOLN: 26.0000 [IU] | Freq: Every day | SUBCUTANEOUS | Status: DC

## 2013-10-28 MED ORDER — MIDODRINE HCL 5 MG PO TABS
ORAL_TABLET | ORAL | Status: AC
  Filled 2013-10-28: qty 2

## 2013-10-28 MED ORDER — HEPARIN SODIUM (PORCINE) 1000 UNIT/ML DIALYSIS: 1000.0000 [IU] | INTRAMUSCULAR | Status: DC | PRN

## 2013-10-28 MED ORDER — ALTEPLASE 2 MG IJ SOLR
2.0000 mg | Freq: Once | INTRAMUSCULAR | Status: DC | PRN
  Filled 2013-10-28: qty 2

## 2013-10-28 MED ORDER — HEPARIN SODIUM (PORCINE) 1000 UNIT/ML DIALYSIS
100.0000 [IU]/kg | INTRAMUSCULAR | Status: DC | PRN
  Filled 2013-10-28: qty 12

## 2013-10-28 NOTE — Discharge Summary (Signed)
[name], 68 y.o., DOB [birth year], MRN [id number]. Admission date: 10/26/2013 Discharge Date 10/28/2013 Primary MD [name], MD Admitting Physician Zannie Cove, MD  Admission Diagnosis  Pain [R52] ESRD (end stage renal disease) on dialysis [N18.6, Z99.2] Obesity hypoventilation syndrome [E66.2] Chest pain, unspecified chest pain type [R07.9]  Discharge Diagnosis   Principal Problem:   Chest pain at rest Active Problems:   ESRD (end stage renal disease) on dialysis   DM2 (diabetes mellitus, type 2)   CAD- non obstructive 2011   HTN (hypertension)   COPD (chronic obstructive pulmonary disease)   OSA (obstructive sleep apnea)   Aortic stenosis- mild to moderate with normal LVF   Hyperkalemia      Past Medical History  Diagnosis Date  . CHF (congestive heart failure)   . CAD (coronary artery disease) 2006  . COPD (chronic obstructive pulmonary disease)   . Hyperlipidemia   . ESRD (end stage renal disease) on dialysis started 08/2006    Started HD in September 2008.  R arm AVG placed 2008 was ligated 2009 due to steal syndrome.  Gets HD in [place] on TTS schedule. ESRD was due to DM.    Marland Kitchen Type II diabetes mellitus     etiology of ESRD  . Diabetic blindness   . Hypertension   . MI, old 2006  . Asthma   . Pneumonia     "everytime I take a cold" (05/07/2012)  . Bronchiectasis   . GERD (gastroesophageal reflux disease)   . Stroke 2006    "numb on left side since"  05/07/2012  . On home oxygen therapy     "3L at night mostly; also prn" (05/07/2012)  . Cardiomegaly     Past Surgical History  Procedure Laterality Date  . Av fistula placement Right 09/2006    "used it once" (05/07/2012)  . Appendectomy  ~ 1958  . Insertion of dialysis catheter Left 09/2006    "chest" (05/07/2012)  . Total hip arthroplasty Left 1997  . Repair ankle ligament Left 1981  . Cataract extraction w/ intraocular lens implant Left ?2012   Admission history of present illness/brief  narrative: [name] is a 68 y.o. male with PMH of ESRD on HD MWF, OSA, COPD, CAD, CVA, DM, Obesity, chronically ill male presents to the ER with the above complaints.  He is a very poor historian, and initially in Triage he reported chest pain since last pm, which reported radiated to neck, now denies this and just says that he is tired and reports some dyspnea and vague abd pain.  He missed his dialysis on Friday, he's had problems with his HD catheter flow.  In ER, noted to have hyperkalemia with K of 6.9 and TRH consulted for further management, patient was dialyzed and hypokalemia resolved, her chest pain seems to be atypical as it was reproducible by palpation, and he had negative cardiac enzymes  and.  Hospital Course See H&P, Labs, Consult and Test reports for all details in brief, patient was admitted for **  Principal Problem:   Chest pain at rest Active Problems:   ESRD (end stage renal disease) on dialysis   DM2 (diabetes mellitus, type 2)   CAD- non obstructive 2011   HTN (hypertension)   COPD (chronic obstructive pulmonary disease)   OSA (obstructive sleep apnea)   Aortic stenosis- mild to moderate with normal LVF   Hyperkalemia  Chest pain at rest  -now resolved  -likely due to volume overload, CTA negative for  PE  -Negative cardiac enzymes  -had a stress test 9/15 which did not show any inducible ischemia  -Atypical, musculoskeletal, reproducible by palpation.  Hyperkalemia  - Corrected with hemodialysis  ESRD (end stage renal disease) on dialysis  - HD per Renal  DM2 (diabetes mellitus, type 2)  - Resume Lantus, SSI  -Had an episode of hypoglycemia at day of discharge, so his Lantus dose is being adjusted at home, will decrease Lantus 13 units twice a day  to 26 units daytime and 20 units at bedtime. CAD- non obstructive 2011  - myoview 9/15 normal  -continue ASA/plavix/toprol  H/o CVA  -continue Plavix/ASA  HTN (hypertension)  -stable, continue  metoprolol  -gets midodrine on HD days  COPD (chronic obstructive pulmonary disease)  -stable, nebs PRN  OSA (obstructive sleep apnea)  -does not use CPAP, used to use O2 QHS but reportedly doesn't use this anymore      Consults  renal  Significant Tests:  See full reports for all details    Ct Angio Chest Pe W/cm &/or Wo Cm  10/26/2013   CLINICAL DATA:  Chest pain starting last night at 2000. Had dialysis on Thursday because a point at [place] on Friday. Due for dialysis today at 1100. SOB. Midsternal chest pain upon inspiration and palpation. Went away upon transport then reported abdominal pain then back to chest pain when arrived to facility. Denies cough but states he feels he needs to but scared it will hurt.  EXAM: CT ANGIOGRAPHY CHEST WITH CONTRAST  TECHNIQUE: Multidetector CT imaging of the chest was performed using the standard protocol during bolus administration of intravenous contrast. Multiplanar CT image reconstructions and MIPs were obtained to evaluate the vascular anatomy.  CONTRAST:  OMNIPAQUE IOHEXOL 350 MG/ML SOLN  COMPARISON:  None.  FINDINGS: There is adequate opacification of the pulmonary arteries. There is no pulmonary embolus. The main pulmonary artery, right main pulmonary artery and left main pulmonary arteries are normal in size. The heart size is normal. There is no pericardial effusion. There is mild thoracic aortic atherosclerosis. There is no thoracic aortic dissection or aneurysm. The superior mesenteric artery and celiac artery are patent with mild atherosclerotic plaque at the origin. The branch vessels arising from knee aortic arch are patent. There is atherosclerotic plaque with stenosis of the proximal right vertebral artery. Coronary artery atherosclerosis involving the LAD, left main, circumflex and right coronary artery.  There are trace bilateral pleural effusions. There is bibasilar mild interstitial thickening.  There are scattered non pathologically  enlarged mediastinal lymph nodes.  There is no lytic or blastic osseous lesion.  Bilateral kidneys are atrophic.  The gallbladder is distended.  Review of the MIP images confirms the above findings.  IMPRESSION: 1. No thoracic aortic dissection or aneurysm. 2. No evidence of pulmonary embolus. 3. Trace bilateral pleural effusions.   Electronically Signed   By: Elige Ko   On: 10/26/2013 12:21   Dg Chest Portable 1 View  10/26/2013   CLINICAL DATA:  Chest pain starting last night at 2000 hr, end-stage renal disease on dialysis last performed 4 days ago, shortness of breath, mid sternal chest pain with inspiration and palpation. Personal history CHF, CAD, COPD, diabetes, hypertension, stroke, former smoker  EXAM: PORTABLE CHEST - 1 VIEW  COMPARISON:  Portable exam 0953 hr compared to 04/28/2013  FINDINGS: Significantly rotated to the LEFT.  LEFT jugular central venous catheter tip projects over cavoatrial junction.  Enlargement of cardiac silhouette.  Atherosclerotic calcification aorta.  Pulmonary vascularity normal.  Prominent RIGHT peritracheal soft tissues unchanged, less well evaluated on rotated exam.  No definite infiltrate, pleural effusion or pneumothorax.  IMPRESSION: Enlargement of cardiac silhouette.  No definite acute abnormalities identified on rotated exam.   Electronically Signed   By: Ulyses SouthwardMark  Boles M.D.   On: 10/26/2013 10:07     Today   Subjective:   Dereck Ligasonald [place] today has no headache,no chest abdominal pain,no new weakness tingling or numbness.  Objective:   Blood pressure 143/34, pulse 92, temperature 98 F (36.7 C), temperature source Oral, resp. rate 16, height 5\' 8"  (1.727 m), weight 111 kg (244 lb 11.4 oz), SpO2 100.00%.  Intake/Output Summary (Last 24 hours) at 10/28/13 1203 Last data filed at 10/28/13 1130  Gross per 24 hour  Intake    240 ml  Output    888 ml  Net   -648 ml    Exam Awake Alert, Oriented *3, No new F.N deficits, Normal  affect [place].AT,PERRAL Supple Neck,No JVD, No cervical lymphadenopathy appriciated.  Symmetrical Chest wall movement, Good air movement bilaterally, CTAB RRR,No Gallops,Rubs or new Murmurs, No Parasternal Heave +ve B.Sounds, Abd Soft, Non tender, No organomegaly appriciated, No rebound -guarding or rigidity. No Cyanosis, Clubbing or edema, No new Rash or bruise  Data Review    CBC w Diff: Lab Results  Component Value Date   WBC 10.7* 10/28/2013   HGB 10.0* 10/28/2013   HCT 31.7* 10/28/2013   PLT 245 10/28/2013   LYMPHOPCT 8* 10/26/2013   MONOPCT 7 10/26/2013   EOSPCT 1 10/26/2013   BASOPCT 0 10/26/2013   CMP: Lab Results  Component Value Date   NA 140 10/28/2013   K 4.6 10/28/2013   CL 98 10/28/2013   CO2 25 10/28/2013   BUN 48* 10/28/2013   CREATININE 7.98* 10/28/2013   PROT 6.2 10/27/2013   ALBUMIN 2.6* 10/27/2013   BILITOT 0.5 10/27/2013   ALKPHOS 87 10/27/2013   AST 8 10/27/2013   ALT 9 10/27/2013  .  Micro Results Recent Results (from the past 240 hour(s))  MRSA PCR SCREENING     Status: None   Collection Time    10/27/13  6:26 AM      Result Value Ref Range Status   MRSA by PCR NEGATIVE  NEGATIVE Final   Comment:            The GeneXpert MRSA Assay (FDA     approved for NASAL specimens     only), is one component of a     comprehensive MRSA colonization     surveillance program. It is not     intended to diagnose MRSA     infection nor to guide or     monitor treatment for     MRSA infections.     Discharge Instructions        Discharge Medications     Medication List         albuterol 108 (90 BASE) MCG/ACT inhaler  Commonly known as:  PROVENTIL HFA;VENTOLIN HFA  Inhale into the lungs every 6 (six) hours as needed for wheezing or shortness of breath.     albuterol (2.5 MG/3ML) 0.083% nebulizer solution  Commonly known as:  PROVENTIL  Take 2.5 mg by nebulization every 6 (six) hours as needed for wheezing or shortness of breath.      aspirin EC 325 MG tablet  Take 325 mg by mouth daily.     calcium acetate 667 MG capsule  Commonly known  as:  PHOSLO  Take 2,668 mg by mouth 3 (three) times daily with meals.     calcium-vitamin D 500-200 MG-UNIT per tablet  Commonly known as:  OSCAL WITH D  Take 1 tablet by mouth daily with breakfast.     cinacalcet 60 MG tablet  Commonly known as:  SENSIPAR  Take 60 mg by mouth daily.     clopidogrel 75 MG tablet  Commonly known as:  PLAVIX  Take 75 mg by mouth daily with breakfast.     cyanocobalamin 500 MCG tablet  Take 500 mcg by mouth daily.     docusate sodium 100 MG capsule  Commonly known as:  COLACE  Take 100 mg by mouth 2 (two) times daily.     folic acid-vitamin b complex-vitamin c-selenium-zinc 3 MG Tabs tablet  Take 1 tablet by mouth daily.     gabapentin 100 MG capsule  Commonly known as:  NEURONTIN  Take 100 mg by mouth 3 (three) times daily.     insulin aspart 100 UNIT/ML injection  Commonly known as:  novoLOG  Inject 1-20 Units into the skin 3 (three) times daily with meals. Per sliding scale     insulin glargine 100 UNIT/ML injection  Commonly known as:  LANTUS  Inject 0.26 mLs (26 Units total) into the skin daily.     insulin glargine 100 UNIT/ML injection  Commonly known as:  LANTUS  Inject 0.2 mLs (20 Units total) into the skin at bedtime.     latanoprost 0.005 % ophthalmic solution  Commonly known as:  XALATAN  Place 1 drop into both eyes at bedtime.     metoprolol 50 MG tablet  Commonly known as:  LOPRESSOR  Take 1 tablet (50 mg total) by mouth 3 (three) times daily as needed (as needed for systolic BP > 180).     midodrine 10 MG tablet  Commonly known as:  PROAMATINE  Take 10 mg by mouth See admin instructions. *takes 3 times a day, but only on dialysis days. Does dialysis mwf*     omeprazole 20 MG capsule  Commonly known as:  PRILOSEC  Take 20 mg by mouth 2 (two) times daily.     polyvinyl alcohol 1.4 % ophthalmic solution   Commonly known as:  LIQUIFILM TEARS  Place 1 drop into both eyes daily as needed for dry eyes.     pravastatin 40 MG tablet  Commonly known as:  PRAVACHOL  Take 40 mg by mouth at bedtime.     rOPINIRole 0.25 MG tablet  Commonly known as:  REQUIP  Take 0.5 mg by mouth at bedtime.     SIMBRINZA 1-0.2 % Susp  Generic drug:  Brinzolamide-Brimonidine  Place 1 drop into both eyes 3 (three) times daily.     timolol 0.5 % ophthalmic solution  Commonly known as:  TIMOPTIC  Place 1 drop into both eyes daily.     traMADol 50 MG tablet  Commonly known as:  ULTRAM  Take 50 mg by mouth every 6 (six) hours as needed for moderate pain.         Total Time in preparing paper work, data evaluation and todays exam - 35 minutes  Jenaya Saar M.D on 10/28/2013 at 12:03 PM  [facility] Office  [phone]

## 2013-10-28 NOTE — Procedures (Signed)
I was present at this dialysis session. I have reviewed the session itself and made appropriate changes.   Pt w/ mild asymptomatic hypoglycemia (44 on AM labs) and after juice up to 70s.  Will give more juice. Pt can't tell me his home dose of lantus, states wife takes.    TDC.  UF Goal 4L.  No new issues.  Likely can dc today post HD  Sabra Heck  MD 10/28/2013, 8:36 AM

## 2013-10-28 NOTE — Evaluation (Signed)
Physical Therapy Evaluation Patient Details Name: [name] MRN: [id number] DOB: 1945/02/05 Today's Date: 10/28/2013   History of Present Illness  Pt is a 68 y.o. male with PMH of ESRD on HD MWF, OSA, COPD, CAD, CVA, DM, Obesity, chronically ill male presents to the ER with chest pain. He missed his dialysis on Friday, he's had problems with his HD catheter flow. In the ED, noted to have hyperkalemia with K of 6.9.  Clinical Impression  Pt admitted with the above. Pt currently with functional limitations due to the deficits listed below (see PT Problem List). At the time of PT eval pt was able to perform bed mobility and transfer to EOB with mod assist. Pt was not able to clear hips from EOB for successful sit>stand transfer. Pt appears to be near his baseline of function, however appears to have declined functionally in the past week. Pt will benefit from skilled PT to increase their independence and safety with mobility to allow discharge to the venue listed below. Pt is to d/c this afternoon, and recommended he have +2 assist when transferring to the w/c from the car at home.      Follow Up Recommendations Home health PT;Supervision/Assistance - 24 hour    Equipment Recommendations  None recommended by PT    Recommendations for Other Services       Precautions / Restrictions Precautions Precautions: Fall Restrictions Weight Bearing Restrictions: No      Mobility  Bed Mobility Overal bed mobility: Needs Assistance Bed Mobility: Supine to Sit;Sit to Supine     Supine to sit: Mod assist Sit to supine: Mod assist   General bed mobility comments: VC's for use of bed rails for assist. Pt was able to transition to EOB with assist for LE movement and trunk elevation to full sitting. Bed pad used to assist with scooting hips.   Transfers Overall transfer level: Needs assistance Equipment used: Rolling walker (2 wheeled) Transfers: Sit to/from Stand           General  transfer comment: Pt attempted sit>stand x5 and was not able to successfully achieve full standing. Pt requested bed height be raised after each attempt. Pt cued for anterior translation for weight shift, however was not able to clear hips from the bed.  Ambulation/Gait             General Gait Details: Pt unable  Stairs            Wheelchair Mobility    Modified Rankin (Stroke Patients Only)       Balance Overall balance assessment: Needs assistance Sitting-balance support: Feet supported;No upper extremity supported Sitting balance-Leahy Scale: Fair                                       Pertinent Vitals/Pain Pain Assessment: No/denies pain    Home Living Family/patient expects to be discharged to:: Private residence Living Arrangements: Spouse/significant other Available Help at Discharge: Family;Available 24 hours/day Type of Home: Mobile home Home Access: Ramped entrance     Home Layout: One level Home Equipment: Bedside commode;Wheelchair - Technical sales engineer - 4 wheels;Walker - 2 wheels      Prior Function Level of Independence: Needs assistance   Gait / Transfers Assistance Needed: Wife assists with transfers. Pt states he is either in the bed or in the wheelchair during the day.   ADL's / Homemaking Assistance Needed:  pt reports wife assists with bathing/dressing, stating he cannot reach below his knees. Sponge bath only.         Hand Dominance   Dominant Hand: Right    Extremity/Trunk Assessment   Upper Extremity Assessment: Defer to OT evaluation           Lower Extremity Assessment: Generalized weakness      Cervical / Trunk Assessment: Normal  Communication   Communication: No difficulties  Cognition Arousal/Alertness: Awake/alert Behavior During Therapy: WFL for tasks assessed/performed Overall Cognitive Status: Within Functional Limits for tasks assessed                      General  Comments      Exercises        Assessment/Plan    PT Assessment Patient needs continued PT services  PT Diagnosis Difficulty walking;Generalized weakness   PT Problem List Decreased strength;Decreased range of motion;Decreased activity tolerance;Decreased balance;Decreased mobility;Decreased knowledge of use of DME;Decreased safety awareness  PT Treatment Interventions DME instruction;Gait training;Stair training;Functional mobility training;Therapeutic activities;Therapeutic exercise;Neuromuscular re-education;Patient/family education   PT Goals (Current goals can be found in the Care Plan section) Acute Rehab PT Goals Patient Stated Goal: To return home today.  PT Goal Formulation: With patient Time For Goal Achievement: 11/04/13 Potential to Achieve Goals: Good    Frequency Min 3X/week   Barriers to discharge        Co-evaluation               End of Session Equipment Utilized During Treatment: Gait belt Activity Tolerance: Patient limited by fatigue Patient left: in bed;with call bell/phone within reach;with bed alarm set Nurse Communication: Mobility status;Other (comment) (Pt requesting eye drops )         Time: [id number] PT Time Calculation (min): 38 min   Charges:   PT Evaluation $Initial PT Evaluation Tier I: 1 Procedure PT Treatments $Therapeutic Activity: 23-37 mins   PT G Codes:          Conni SlipperKirkman, Ayane Delancey 10/28/2013, 3:16 PM  Conni SlipperLaura Anayia Eugene, PT, DPT Acute Rehabilitation Services Pager: [phone]

## 2013-10-28 NOTE — Discharge Instructions (Signed)
Follow with Primary MD Zetta Bills K., MD in 7 days      Activity: As tolerated with Full fall precautions use walker/cane & assistance as needed   Disposition Home    Diet: Renal dialysis with 1200 mL fluid restriction , with feeding assistance and aspiration precautions as needed.  For Heart failure patients - Check your Weight same time everyday, if you gain over 2 pounds, or you develop in leg swelling, experience more shortness of breath or chest pain, call your Primary MD immediately.   On your next visit with your primary care physician please Get Medicines reviewed and adjusted.   Please request your Prim.MD to go over all Hospital Tests and Procedure/Radiological results at the follow up, please get all Hospital records sent to your Prim MD by signing hospital release before you go home.   If you experience worsening of your admission symptoms, develop shortness of breath, life threatening emergency, suicidal or homicidal thoughts you must seek medical attention immediately by calling 911 or calling your MD immediately  if symptoms less severe.  You Must read complete instructions/literature along with all the possible adverse reactions/side effects for all the Medicines you take and that have been prescribed to you. Take any new Medicines after you have completely understood and accpet all the possible adverse reactions/side effects.   Do not drive, operating heavy machinery, perform activities at heights, swimming or participation in water activities or provide baby sitting services if your were admitted for syncope or siezures until you have seen by Primary MD or a Neurologist and advised to do so again.  Do not drive when taking Pain medications.    Do not take more than prescribed Pain, Sleep and Anxiety Medications  Special Instructions: If you have smoked or chewed Tobacco  in the last 2 yrs please stop smoking, stop any regular Alcohol  and or any Recreational drug  use.  Wear Seat belts while driving.   Please note  You were cared for by a hospitalist during your hospital stay. If you have any questions about your discharge medications or the care you received while you were in the hospital after you are discharged, you can call the unit and asked to speak with the hospitalist on call if the hospitalist that took care of you is not available. Once you are discharged, your primary care physician will handle any further medical issues. Please note that NO REFILLS for any discharge medications will be authorized once you are discharged, as it is imperative that you return to your primary care physician (or establish a relationship with a primary care physician if you do not have one) for your aftercare needs so that they can reassess your need for medications and monitor your lab values.

## 2014-02-19 IMAGING — CR DG CHEST 1V PORT
1 series · 1 of 1 positions shown · non-contrast
Comparison: 08/30/2012.

CLINICAL DATA: Weakness.

EXAM:
PORTABLE CHEST - 1 VIEW

[AP]
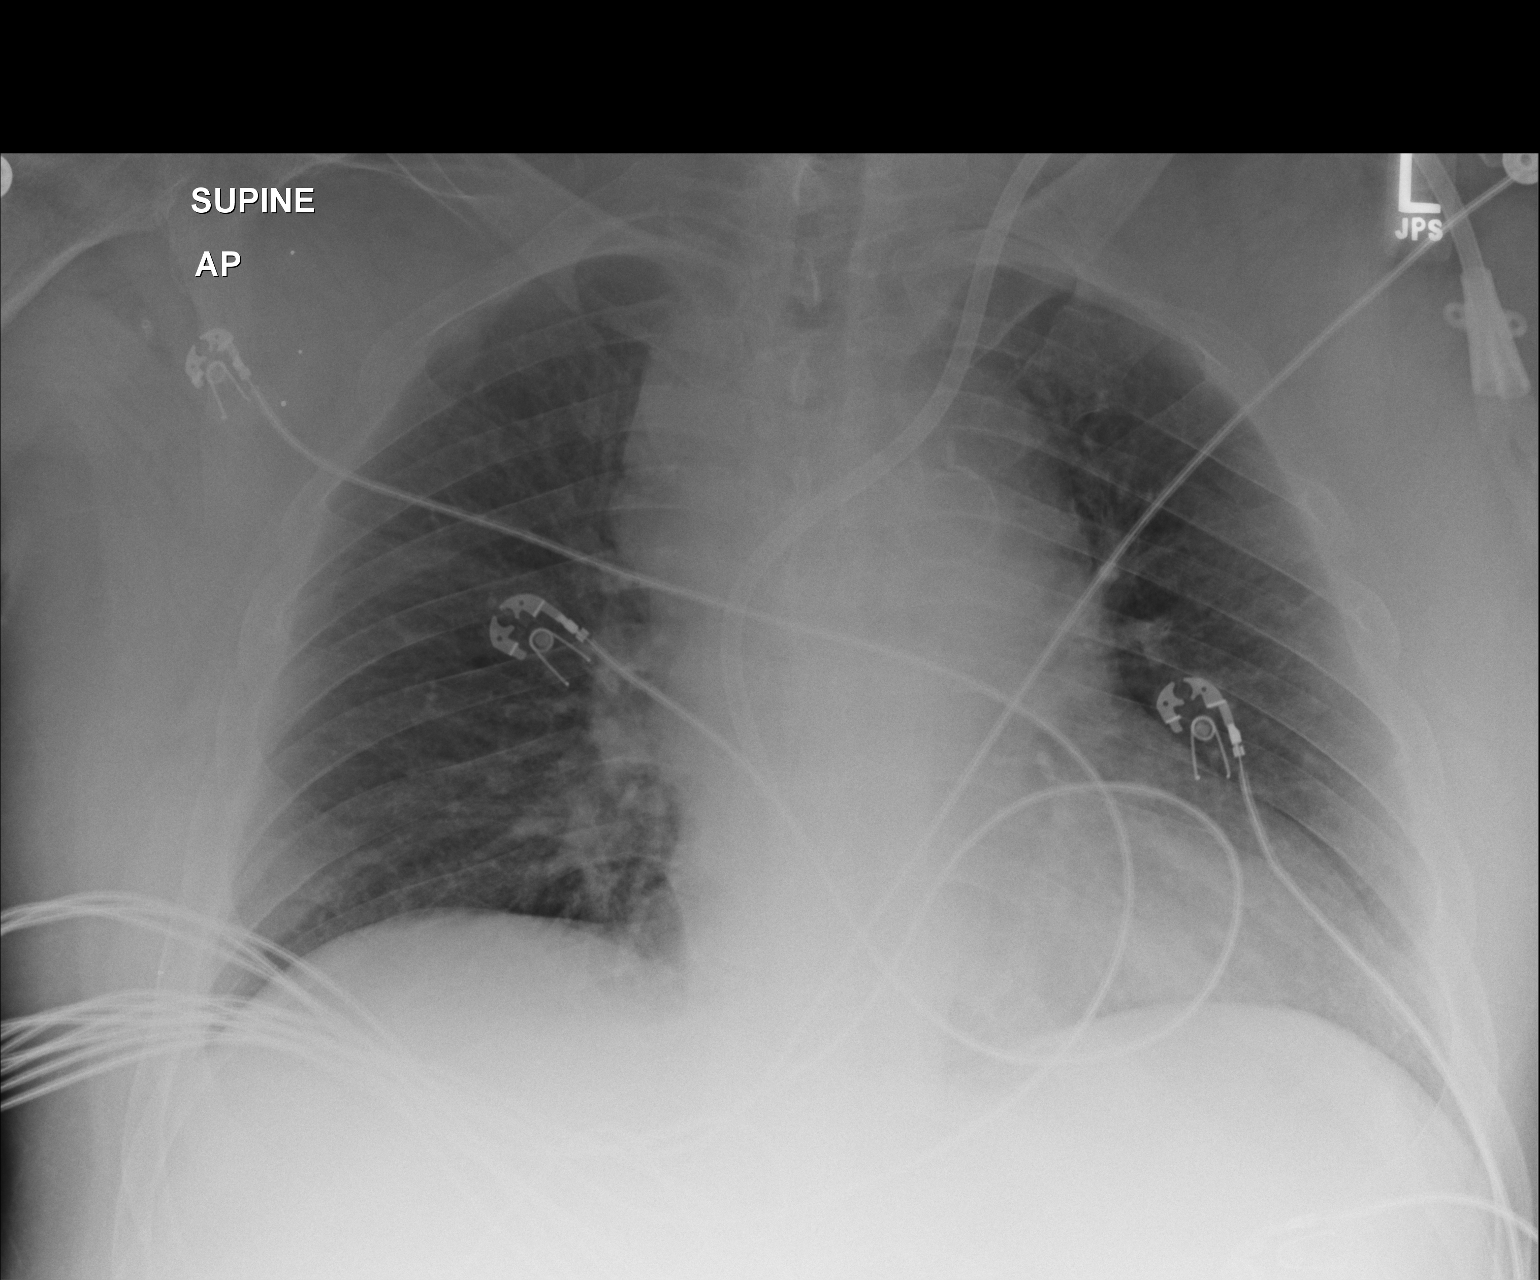

[1 of 1 positions shown; findings below may reference images not displayed]

FINDINGS: The left IJ a dialysis catheter is stable. Is stable prominent
mediastinal and hilar contours stable calcified structure near the
aortic arch. The lungs are clear. No pleural effusion or pulmonary
edema. No pneumothorax.
IMPRESSION: Stable cardiomediastinal contours.

No acute pulmonary findings.

## 2014-03-13 DIAGNOSIS — J449 Chronic obstructive pulmonary disease, unspecified: Secondary | ICD-10-CM | POA: Diagnosis not present

## 2014-03-13 DIAGNOSIS — R10817 Generalized abdominal tenderness: Secondary | ICD-10-CM | POA: Diagnosis not present

## 2014-03-13 DIAGNOSIS — R110 Nausea: Secondary | ICD-10-CM | POA: Diagnosis not present

## 2014-03-13 DIAGNOSIS — E780 Pure hypercholesterolemia: Secondary | ICD-10-CM | POA: Diagnosis not present

## 2014-03-13 DIAGNOSIS — R197 Diarrhea, unspecified: Secondary | ICD-10-CM | POA: Diagnosis not present

## 2014-03-13 DIAGNOSIS — R109 Unspecified abdominal pain: Secondary | ICD-10-CM | POA: Diagnosis not present

## 2014-03-13 DIAGNOSIS — I517 Cardiomegaly: Secondary | ICD-10-CM | POA: Diagnosis not present

## 2014-03-13 DIAGNOSIS — K2970 Gastritis, unspecified, without bleeding: Secondary | ICD-10-CM | POA: Diagnosis not present

## 2014-03-13 DIAGNOSIS — J90 Pleural effusion, not elsewhere classified: Secondary | ICD-10-CM | POA: Diagnosis not present

## 2014-03-13 DIAGNOSIS — E119 Type 2 diabetes mellitus without complications: Secondary | ICD-10-CM | POA: Diagnosis not present

## 2014-03-13 DIAGNOSIS — Z794 Long term (current) use of insulin: Secondary | ICD-10-CM | POA: Diagnosis not present

## 2014-03-13 DIAGNOSIS — J811 Chronic pulmonary edema: Secondary | ICD-10-CM | POA: Diagnosis not present

## 2014-03-13 DIAGNOSIS — I10 Essential (primary) hypertension: Secondary | ICD-10-CM | POA: Diagnosis not present

## 2014-05-01 DIAGNOSIS — N186 End stage renal disease: Secondary | ICD-10-CM | POA: Diagnosis not present

## 2014-05-01 DIAGNOSIS — E1129 Type 2 diabetes mellitus with other diabetic kidney complication: Secondary | ICD-10-CM | POA: Diagnosis not present

## 2014-05-01 DIAGNOSIS — Z992 Dependence on renal dialysis: Secondary | ICD-10-CM | POA: Diagnosis not present

## 2014-05-04 DIAGNOSIS — H5712 Ocular pain, left eye: Secondary | ICD-10-CM | POA: Diagnosis not present

## 2014-05-04 DIAGNOSIS — H5713 Ocular pain, bilateral: Secondary | ICD-10-CM | POA: Diagnosis not present

## 2014-05-04 DIAGNOSIS — G311 Senile degeneration of brain, not elsewhere classified: Secondary | ICD-10-CM | POA: Diagnosis not present

## 2014-05-04 DIAGNOSIS — R030 Elevated blood-pressure reading, without diagnosis of hypertension: Secondary | ICD-10-CM | POA: Diagnosis not present

## 2014-05-04 DIAGNOSIS — H5462 Unqualified visual loss, left eye, normal vision right eye: Secondary | ICD-10-CM | POA: Diagnosis not present

## 2014-05-04 DIAGNOSIS — H109 Unspecified conjunctivitis: Secondary | ICD-10-CM | POA: Diagnosis not present

## 2014-05-05 DIAGNOSIS — Z9981 Dependence on supplemental oxygen: Secondary | ICD-10-CM | POA: Diagnosis not present

## 2014-05-05 DIAGNOSIS — Z743 Need for continuous supervision: Secondary | ICD-10-CM | POA: Diagnosis not present

## 2014-05-05 DIAGNOSIS — H5712 Ocular pain, left eye: Secondary | ICD-10-CM | POA: Diagnosis not present

## 2014-05-17 ENCOUNTER — Encounter (HOSPITAL_COMMUNITY): Payer: Self-pay | Admitting: Family Medicine

## 2014-05-17 ENCOUNTER — Inpatient Hospital Stay (HOSPITAL_COMMUNITY)
Admission: AD | Admit: 2014-05-17 | Discharge: 2014-05-20 | DRG: 091 | Disposition: A | Discharge status: Home / Self Care | Payer: Medicare Other | Source: Other Acute Inpatient Hospital | Attending: Internal Medicine | Admitting: Internal Medicine

## 2014-05-17 ENCOUNTER — Inpatient Hospital Stay (HOSPITAL_COMMUNITY): Payer: Medicare Other

## 2014-05-17 ENCOUNTER — Telehealth: Payer: Self-pay | Admitting: Internal Medicine

## 2014-05-17 DIAGNOSIS — Z8673 Personal history of transient ischemic attack (TIA), and cerebral infarction without residual deficits: Secondary | ICD-10-CM | POA: Diagnosis not present

## 2014-05-17 DIAGNOSIS — Z96642 Presence of left artificial hip joint: Secondary | ICD-10-CM | POA: Diagnosis not present

## 2014-05-17 DIAGNOSIS — J811 Chronic pulmonary edema: Secondary | ICD-10-CM

## 2014-05-17 DIAGNOSIS — E1122 Type 2 diabetes mellitus with diabetic chronic kidney disease: Secondary | ICD-10-CM | POA: Insufficient documentation

## 2014-05-17 DIAGNOSIS — I252 Old myocardial infarction: Secondary | ICD-10-CM | POA: Diagnosis not present

## 2014-05-17 DIAGNOSIS — F0150 Vascular dementia without behavioral disturbance: Secondary | ICD-10-CM | POA: Diagnosis present

## 2014-05-17 DIAGNOSIS — R410 Disorientation, unspecified: Secondary | ICD-10-CM | POA: Diagnosis present

## 2014-05-17 DIAGNOSIS — I429 Cardiomyopathy, unspecified: Secondary | ICD-10-CM | POA: Diagnosis not present

## 2014-05-17 DIAGNOSIS — Z7902 Long term (current) use of antithrombotics/antiplatelets: Secondary | ICD-10-CM | POA: Diagnosis not present

## 2014-05-17 DIAGNOSIS — Z9119 Patient's noncompliance with other medical treatment and regimen: Secondary | ICD-10-CM | POA: Diagnosis not present

## 2014-05-17 DIAGNOSIS — Z9842 Cataract extraction status, left eye: Secondary | ICD-10-CM

## 2014-05-17 DIAGNOSIS — E785 Hyperlipidemia, unspecified: Secondary | ICD-10-CM | POA: Diagnosis present

## 2014-05-17 DIAGNOSIS — I350 Nonrheumatic aortic (valve) stenosis: Secondary | ICD-10-CM | POA: Diagnosis not present

## 2014-05-17 DIAGNOSIS — I120 Hypertensive chronic kidney disease with stage 5 chronic kidney disease or end stage renal disease: Secondary | ICD-10-CM | POA: Diagnosis present

## 2014-05-17 DIAGNOSIS — I509 Heart failure, unspecified: Secondary | ICD-10-CM | POA: Diagnosis present

## 2014-05-17 DIAGNOSIS — R0602 Shortness of breath: Secondary | ICD-10-CM | POA: Diagnosis not present

## 2014-05-17 DIAGNOSIS — Z961 Presence of intraocular lens: Secondary | ICD-10-CM | POA: Diagnosis not present

## 2014-05-17 DIAGNOSIS — G2581 Restless legs syndrome: Secondary | ICD-10-CM | POA: Diagnosis present

## 2014-05-17 DIAGNOSIS — Z7982 Long term (current) use of aspirin: Secondary | ICD-10-CM

## 2014-05-17 DIAGNOSIS — G928 Other toxic encephalopathy: Secondary | ICD-10-CM | POA: Diagnosis present

## 2014-05-17 DIAGNOSIS — R40243 Glasgow coma scale score 3-8: Secondary | ICD-10-CM | POA: Diagnosis not present

## 2014-05-17 DIAGNOSIS — J449 Chronic obstructive pulmonary disease, unspecified: Secondary | ICD-10-CM | POA: Diagnosis present

## 2014-05-17 DIAGNOSIS — H5441 Blindness, right eye, normal vision left eye: Secondary | ICD-10-CM | POA: Diagnosis present

## 2014-05-17 DIAGNOSIS — G4733 Obstructive sleep apnea (adult) (pediatric): Secondary | ICD-10-CM | POA: Diagnosis present

## 2014-05-17 DIAGNOSIS — T450X2A Poisoning by antiallergic and antiemetic drugs, intentional self-harm, initial encounter: Secondary | ICD-10-CM | POA: Diagnosis not present

## 2014-05-17 DIAGNOSIS — Z09 Encounter for follow-up examination after completed treatment for conditions other than malignant neoplasm: Secondary | ICD-10-CM

## 2014-05-17 DIAGNOSIS — Z880 Allergy status to penicillin: Secondary | ICD-10-CM | POA: Diagnosis not present

## 2014-05-17 DIAGNOSIS — D631 Anemia in chronic kidney disease: Secondary | ICD-10-CM | POA: Diagnosis not present

## 2014-05-17 DIAGNOSIS — I2510 Atherosclerotic heart disease of native coronary artery without angina pectoris: Secondary | ICD-10-CM | POA: Diagnosis not present

## 2014-05-17 DIAGNOSIS — Z992 Dependence on renal dialysis: Secondary | ICD-10-CM | POA: Diagnosis not present

## 2014-05-17 DIAGNOSIS — Z79899 Other long term (current) drug therapy: Secondary | ICD-10-CM

## 2014-05-17 DIAGNOSIS — H540 Blindness, both eyes: Secondary | ICD-10-CM | POA: Diagnosis present

## 2014-05-17 DIAGNOSIS — Z794 Long term (current) use of insulin: Secondary | ICD-10-CM | POA: Diagnosis not present

## 2014-05-17 DIAGNOSIS — J189 Pneumonia, unspecified organism: Secondary | ICD-10-CM | POA: Diagnosis not present

## 2014-05-17 DIAGNOSIS — Z9981 Dependence on supplemental oxygen: Secondary | ICD-10-CM | POA: Diagnosis not present

## 2014-05-17 DIAGNOSIS — G92 Toxic encephalopathy: Secondary | ICD-10-CM | POA: Diagnosis not present

## 2014-05-17 DIAGNOSIS — N390 Urinary tract infection, site not specified: Secondary | ICD-10-CM | POA: Diagnosis not present

## 2014-05-17 DIAGNOSIS — N189 Chronic kidney disease, unspecified: Secondary | ICD-10-CM | POA: Diagnosis not present

## 2014-05-17 DIAGNOSIS — N186 End stage renal disease: Secondary | ICD-10-CM | POA: Diagnosis not present

## 2014-05-17 DIAGNOSIS — K219 Gastro-esophageal reflux disease without esophagitis: Secondary | ICD-10-CM | POA: Diagnosis not present

## 2014-05-17 DIAGNOSIS — F0390 Unspecified dementia without behavioral disturbance: Secondary | ICD-10-CM | POA: Diagnosis present

## 2014-05-17 DIAGNOSIS — Z87891 Personal history of nicotine dependence: Secondary | ICD-10-CM | POA: Diagnosis not present

## 2014-05-17 DIAGNOSIS — E162 Hypoglycemia, unspecified: Secondary | ICD-10-CM | POA: Diagnosis not present

## 2014-05-17 DIAGNOSIS — J181 Lobar pneumonia, unspecified organism: Secondary | ICD-10-CM | POA: Diagnosis not present

## 2014-05-17 DIAGNOSIS — R0902 Hypoxemia: Secondary | ICD-10-CM | POA: Diagnosis present

## 2014-05-17 DIAGNOSIS — Z888 Allergy status to other drugs, medicaments and biological substances status: Secondary | ICD-10-CM | POA: Diagnosis not present

## 2014-05-17 DIAGNOSIS — R4182 Altered mental status, unspecified: Secondary | ICD-10-CM | POA: Diagnosis not present

## 2014-05-17 DIAGNOSIS — E119 Type 2 diabetes mellitus without complications: Secondary | ICD-10-CM | POA: Diagnosis not present

## 2014-05-17 DIAGNOSIS — J45909 Unspecified asthma, uncomplicated: Secondary | ICD-10-CM | POA: Diagnosis present

## 2014-05-17 DIAGNOSIS — R404 Transient alteration of awareness: Secondary | ICD-10-CM | POA: Diagnosis not present

## 2014-05-17 DIAGNOSIS — R918 Other nonspecific abnormal finding of lung field: Secondary | ICD-10-CM | POA: Diagnosis not present

## 2014-05-17 DIAGNOSIS — T50901A Poisoning by unspecified drugs, medicaments and biological substances, accidental (unintentional), initial encounter: Secondary | ICD-10-CM | POA: Diagnosis not present

## 2014-05-17 LAB — CBC
HEMATOCRIT: 38.5 % — AB (ref 39.0–52.0)
HEMOGLOBIN: 12.4 g/dL — AB (ref 13.0–17.0)
MCH: 30.1 pg (ref 26.0–34.0)
MCHC: 32.2 g/dL (ref 30.0–36.0)
MCV: 93.4 fL (ref 78.0–100.0)
Platelets: 188 10*3/uL (ref 150–400)
RBC: 4.12 MIL/uL — AB (ref 4.22–5.81)
RDW: 13.2 % (ref 11.5–15.5)
WBC: 9.7 10*3/uL (ref 4.0–10.5)

## 2014-05-17 LAB — MRSA PCR SCREENING: MRSA BY PCR: NEGATIVE

## 2014-05-17 LAB — CREATININE, SERUM
CREATININE: 8.24 mg/dL — AB (ref 0.61–1.24)
GFR calc Af Amer: 7 mL/min — ABNORMAL LOW (ref >60)
GFR, EST NON AFRICAN AMERICAN: 6 mL/min — AB (ref >60)

## 2014-05-17 LAB — GLUCOSE, CAPILLARY: Glucose-Capillary: 193 mg/dL — ABNORMAL HIGH (ref 65–99)

## 2014-05-17 MED ORDER — CLOPIDOGREL BISULFATE 75 MG PO TABS
75.0000 mg | ORAL_TABLET | Freq: Every day | ORAL | Status: DC
  Administered 2014-05-18 – 2014-05-20 (×3): 75 mg via ORAL
  Filled 2014-05-17 (×4): qty 1

## 2014-05-17 MED ORDER — METOPROLOL TARTRATE 50 MG PO TABS
50.0000 mg | ORAL_TABLET | Freq: Three times a day (TID) | ORAL | Status: DC | PRN
  Administered 2014-05-18: 50 mg via ORAL
  Filled 2014-05-17 (×2): qty 1

## 2014-05-17 MED ORDER — POLYVINYL ALCOHOL 1.4 % OP SOLN
1.0000 [drp] | Freq: Every day | OPHTHALMIC | Status: DC | PRN
  Filled 2014-05-17: qty 15

## 2014-05-17 MED ORDER — LEVOFLOXACIN IN D5W 750 MG/150ML IV SOLN
750.0000 mg | Freq: Once | INTRAVENOUS | Status: AC
  Administered 2014-05-17: 750 mg via INTRAVENOUS
  Filled 2014-05-17: qty 150

## 2014-05-17 MED ORDER — LEVOFLOXACIN IN D5W 500 MG/100ML IV SOLN: 500.0000 mg | INTRAVENOUS | Status: DC

## 2014-05-17 MED ORDER — CALCIUM ACETATE 667 MG PO CAPS
2668.0000 mg | ORAL_CAPSULE | Freq: Three times a day (TID) | ORAL | Status: DC
  Administered 2014-05-18: 2668 mg via ORAL
  Filled 2014-05-17 (×7): qty 4

## 2014-05-17 MED ORDER — PANTOPRAZOLE SODIUM 40 MG PO TBEC
40.0000 mg | DELAYED_RELEASE_TABLET | Freq: Every day | ORAL | Status: DC
  Administered 2014-05-18 – 2014-05-20 (×3): 40 mg via ORAL
  Filled 2014-05-17 (×4): qty 1

## 2014-05-17 MED ORDER — TRAMADOL HCL 50 MG PO TABS: 50.0000 mg | ORAL_TABLET | Freq: Four times a day (QID) | ORAL | Status: DC | PRN

## 2014-05-17 MED ORDER — LEVOFLOXACIN IN D5W 750 MG/150ML IV SOLN: 750.0000 mg | INTRAVENOUS | Status: DC

## 2014-05-17 MED ORDER — TIMOLOL MALEATE 0.5 % OP SOLN
1.0000 [drp] | Freq: Every day | OPHTHALMIC | Status: DC
  Administered 2014-05-17 – 2014-05-20 (×4): 1 [drp] via OPHTHALMIC
  Filled 2014-05-17: qty 5

## 2014-05-17 MED ORDER — INSULIN ASPART 100 UNIT/ML ~~LOC~~ SOLN
0.0000 [IU] | Freq: Three times a day (TID) | SUBCUTANEOUS | Status: DC
  Administered 2014-05-18 (×2): 3 [IU] via SUBCUTANEOUS

## 2014-05-17 MED ORDER — DOCUSATE SODIUM 100 MG PO CAPS
100.0000 mg | ORAL_CAPSULE | Freq: Two times a day (BID) | ORAL | Status: DC
  Administered 2014-05-18 – 2014-05-20 (×5): 100 mg via ORAL
  Filled 2014-05-17 (×7): qty 1

## 2014-05-17 MED ORDER — INSULIN GLARGINE 100 UNIT/ML ~~LOC~~ SOLN: 26.0000 [IU] | Freq: Every day | SUBCUTANEOUS | Status: DC

## 2014-05-17 MED ORDER — INSULIN GLARGINE 100 UNIT/ML ~~LOC~~ SOLN
30.0000 [IU] | Freq: Two times a day (BID) | SUBCUTANEOUS | Status: DC
  Administered 2014-05-17 – 2014-05-18 (×3): 30 [IU] via SUBCUTANEOUS
  Filled 2014-05-17 (×6): qty 0.3

## 2014-05-17 MED ORDER — PRAVASTATIN SODIUM 40 MG PO TABS
40.0000 mg | ORAL_TABLET | Freq: Every day | ORAL | Status: DC
  Administered 2014-05-17 – 2014-05-19 (×3): 40 mg via ORAL
  Filled 2014-05-17 (×4): qty 1

## 2014-05-17 MED ORDER — INSULIN GLARGINE 100 UNIT/ML ~~LOC~~ SOLN: 20.0000 [IU] | Freq: Every day | SUBCUTANEOUS | Status: DC

## 2014-05-17 MED ORDER — CINACALCET HCL 30 MG PO TABS
60.0000 mg | ORAL_TABLET | Freq: Every day | ORAL | Status: DC
  Administered 2014-05-18 – 2014-05-20 (×3): 60 mg via ORAL
  Filled 2014-05-17 (×4): qty 2

## 2014-05-17 MED ORDER — MIDODRINE HCL 5 MG PO TABS
10.0000 mg | ORAL_TABLET | ORAL | Status: DC
  Administered 2014-05-19 (×2): 10 mg via ORAL
  Filled 2014-05-17 (×4): qty 2

## 2014-05-17 MED ORDER — CYANOCOBALAMIN 500 MCG PO TABS
500.0000 ug | ORAL_TABLET | Freq: Every day | ORAL | Status: DC
  Administered 2014-05-18 – 2014-05-20 (×3): 500 ug via ORAL
  Filled 2014-05-17 (×3): qty 1

## 2014-05-17 MED ORDER — GABAPENTIN 100 MG PO CAPS
100.0000 mg | ORAL_CAPSULE | Freq: Three times a day (TID) | ORAL | Status: DC
  Filled 2014-05-17 (×4): qty 1

## 2014-05-17 MED ORDER — CALCIUM CARBONATE-VITAMIN D 500-200 MG-UNIT PO TABS
1.0000 | ORAL_TABLET | Freq: Every day | ORAL | Status: DC
  Administered 2014-05-18: 1 via ORAL
  Filled 2014-05-17 (×3): qty 1

## 2014-05-17 MED ORDER — LATANOPROST 0.005 % OP SOLN
1.0000 [drp] | Freq: Every day | OPHTHALMIC | Status: DC
  Administered 2014-05-17 – 2014-05-19 (×3): 1 [drp] via OPHTHALMIC
  Filled 2014-05-17: qty 2.5

## 2014-05-17 MED ORDER — ALBUTEROL SULFATE (2.5 MG/3ML) 0.083% IN NEBU: 2.5000 mg | INHALATION_SOLUTION | Freq: Four times a day (QID) | RESPIRATORY_TRACT | Status: DC | PRN

## 2014-05-17 MED ORDER — INSULIN ASPART 100 UNIT/ML ~~LOC~~ SOLN
3.0000 [IU] | Freq: Three times a day (TID) | SUBCUTANEOUS | Status: DC
  Administered 2014-05-18 – 2014-05-19 (×3): 3 [IU] via SUBCUTANEOUS

## 2014-05-17 MED ORDER — ROPINIROLE HCL 0.5 MG PO TABS
0.5000 mg | ORAL_TABLET | Freq: Every day | ORAL | Status: DC
  Administered 2014-05-17 – 2014-05-19 (×3): 0.5 mg via ORAL
  Filled 2014-05-17 (×4): qty 1

## 2014-05-17 MED ORDER — HEPARIN SODIUM (PORCINE) 5000 UNIT/ML IJ SOLN
5000.0000 [IU] | Freq: Three times a day (TID) | INTRAMUSCULAR | Status: DC
  Administered 2014-05-17 – 2014-05-20 (×7): 5000 [IU] via SUBCUTANEOUS
  Filled 2014-05-17 (×10): qty 1

## 2014-05-17 NOTE — Telephone Encounter (Signed)
  PENDING ACCEPTANCE TRANFER NOTE:  Call received from:    Dr. Littie Deeds in [facility]  REASON FOR REQUESTING TRANSFER:    Need for inpatient dialysis.  CC: Lethargy and confusion  HPI:   69 year old male with ESRD sent from the dialysis center in [place] to [facility] after he was found to be increasingly confused, per wife he was confused for the past couple of days, according to the ED physician he likely has UTI and questionable pneumonia. While he was in dialysis center today the dialysis center staff found him swallowing pink pills identified later as Benadryl, unknown how many pills he swallowed, anyway he was presented to the emergency department with lethargy and confusion. Narcan did not change anything. Per EDP he wakes up to verbal stimuli and goes back into sleep, able to protect his airways so did not intubate him.  Will be admitted stepdown, last set of vitals BP 180/91, HR 76, temp 97.8, RR 18 and O2 sats 96% on 2 L.   PLAN:  According to telephone report, this patient was accepted for transfer to [facility], under TRH team:  [facility]mit,  I have requested an order be written to call Flow Manager at [phone] upon patient arrival to the floor for final physician assignment who will do the admission and give admitting orders.  SIGNED: Clint Lipps, MD [facility]  05/17/2014, 3:13 PM

## 2014-05-17 NOTE — Progress Notes (Signed)
Pt arrived from [place] ED. VSS at this time. Attending at bedside. Will continue to monitor.  CMT and elink notified. Call bell within reach.

## 2014-05-17 NOTE — H&P (Addendum)
[facility] History and Physical  [name] MLJ:[id number] DOB: [birth year] DOA: 05/17/2014  Referring physician: Wetzel Bjornstad PCP: [name]., MD  Specialists: nephro  Chief Complaint: confusion-?ingestion  HPI:   69 y/o ? ESRD MWF @ [place] since 2009-regular Nephrologist is Dr. 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  Friend who stayed with on 05/16/14 stated that he was confused Has been going on since fall 2015 They allegedly changed his gabapentin from 100-->:300 daily which might be Other than that everything in terms of meds has been stable  He started to take benadryl for itching during dialysis  It is unclear if he took any bnenedrly  Allegedly diagnosed with bladder infection and PNA Unclear dry weight Typically every dialysis  didbnt take meds at all yeterday  Non smoker non drinker  Been coughing since ~ 4-5 days, no sputum, -fever, -chills No sick contacts, + diarr 1 day last week No reported n/v "drinks more than he needs to" -cp -stroke like symptoms   labs from [facility] at [facility] room show hemoglobin 14.6, white count 9.1, platelet 161, BUN/creatinine 55/7.3 LFTs within normal range calcium slightly high at  10.5  ABG showed mild met acidosis Urine [unclear how collected]=tntc bacteria CXR? PNA  EKG nonacute sinus rhythm right bundle branch block no significant acute change from prior    CT scan head chronic microvascular ischemia  Review of Systems:   See above discussion   Past Medical History  Diagnosis Date  . CHF (congestive heart failure)   . CAD (coronary artery disease) 2006  . COPD (chronic obstructive pulmonary disease)   . Hyperlipidemia   . ESRD (end stage renal disease) on dialysis started 08/2006    Started HD in September 2008.  R arm AVG placed 2008 was ligated 2009 due to steal syndrome.  Gets HD in [place] on TTS schedule. ESRD was due to DM.    Marland Kitchen Type II diabetes mellitus     etiology of ESRD  . Diabetic blindness   . Hypertension   . MI, old 2006  . Asthma   . Pneumonia     "everytime I take a cold" (05/07/2012)  . Bronchiectasis   . GERD (gastroesophageal reflux disease)   . Stroke 2006    "numb on left side since"  05/07/2012  . On home oxygen therapy     "3L at night mostly; also prn" (05/07/2012)  . Cardiomegaly    Past Surgical History  Procedure Laterality Date  . Av fistula placement Right 09/2006    "used it once" (05/07/2012)  . Appendectomy  ~ 1958  . Insertion of dialysis catheter Left 09/2006    "chest" (05/07/2012)  . Total hip arthroplasty Left 1997  .  Repair ankle ligament Left 1981  . Cataract extraction w/ intraocular lens implant Left ?2012   Social History:  History   Social History Narrative  . No narrative on file    Allergies  Allergen Reactions  . Cefepime Itching  . Penicillins Hives and Swelling    Family History  Problem Relation Age of Onset  . Diabetes Mellitus II Father     Prior to Admission medications   Medication Sig Start Date End Date Taking? Authorizing Provider  albuterol (PROVENTIL HFA;VENTOLIN HFA) 108 (90 BASE) MCG/ACT inhaler Inhale into the lungs every 6 (six) hours as needed for wheezing or shortness  of breath.    Historical Provider, MD  albuterol (PROVENTIL) (2.5 MG/3ML) 0.083% nebulizer solution Take 2.5 mg by nebulization every 6 (six) hours as needed for wheezing or shortness of breath.    Historical Provider, MD  aspirin EC 325 MG tablet Take 325 mg by mouth daily.    Historical Provider, MD  Brinzolamide-Brimonidine [facility]) 1-0.2 % SUSP Place 1 drop into both eyes 3 (three) times daily.    Historical Provider, MD  calcium acetate (PHOSLO) 667 MG capsule Take 2,668 mg by mouth 3 (three) times daily with meals.     Historical Provider, MD  calcium-vitamin D (OSCAL WITH D) 500-200 MG-UNIT per tablet Take 1 tablet by mouth daily with breakfast.     Historical Provider, MD  cinacalcet (SENSIPAR) 60 MG tablet Take 60 mg by mouth daily.    Historical Provider, MD  clopidogrel (PLAVIX) 75 MG tablet Take 75 mg by mouth daily with breakfast.    Historical Provider, MD  cyanocobalamin 500 MCG tablet Take 500 mcg by mouth daily.    Historical Provider, MD  docusate sodium (COLACE) 100 MG capsule Take 100 mg by mouth 2 (two) times daily.    Historical Provider, MD  folic acid-vitamin b complex-vitamin c-selenium-zinc (DIALYVITE) 3 MG TABS tablet Take 1 tablet by mouth daily.    Historical Provider, MD  gabapentin (NEURONTIN) 100 MG capsule Take 100 mg by mouth 3 (three) times daily. 09/02/13   Historical Provider, MD  insulin aspart (NOVOLOG) 100 UNIT/ML injection Inject 1-20 Units into the skin 3 (three) times daily with meals. Per sliding scale 04/22/13   Samella Parr, NP  insulin glargine (LANTUS) 100 UNIT/ML injection Inject 0.26 mLs (26 Units total) into the skin daily. 10/28/13   Silver Huguenin Elgergawy, MD  insulin glargine (LANTUS) 100 UNIT/ML injection Inject 0.2 mLs (20 Units total) into the skin at bedtime. 10/28/13   Silver Huguenin Elgergawy, MD  latanoprost (XALATAN) 0.005 % ophthalmic solution Place 1 drop into both eyes at bedtime.    Historical Provider, MD  metoprolol (LOPRESSOR) 50 MG tablet  Take 1 tablet (50 mg total) by mouth 3 (three) times daily as needed (as needed for systolic BP > 284). 1/32/44   Cherene Altes, MD  midodrine (PROAMATINE) 10 MG tablet Take 10 mg by mouth See admin instructions. *takes 3 times a day, but only on dialysis days. Does dialysis mwf*    Historical Provider, MD  omeprazole (PRILOSEC) 20 MG capsule Take 20 mg by mouth 2 (two) times daily.     Historical Provider, MD  polyvinyl alcohol (LIQUIFILM TEARS) 1.4 % ophthalmic solution Place 1 drop into both eyes daily as needed for dry eyes.    Historical Provider, MD  pravastatin (PRAVACHOL) 40 MG tablet Take 40 mg by mouth at bedtime.     Historical Provider, MD  rOPINIRole (REQUIP) 0.25  MG tablet Take 0.5 mg by mouth at bedtime.     Historical Provider, MD  timolol (TIMOPTIC) 0.5 % ophthalmic solution Place 1 drop into both eyes daily.    Historical Provider, MD  traMADol (ULTRAM) 50 MG tablet Take 50 mg by mouth every 6 (six) hours as needed for moderate pain.    Historical Provider, MD   Physical Exam: There were no vitals filed for this visit.  EOMI NCAT light reflexes present he is able to tell me fingers at about 1 foot distance however he is unable to really distinguish much otherwise  bruit H0-Q6 systolic murmur 3/6 best heard right upper sternal edge, no JVD Chest crackles posteriorly right basal lung fields, no rales no rhonchi Abdomen soft nontender nondistended no rebound no guarding No lower extremity edema Power grossly intact   Labs on Admission:  Basic Metabolic Panel: No results for input(s): NA, K, CL, CO2, GLUCOSE, BUN, CREATININE, CALCIUM, MG, PHOS in the last 168 hours. Liver Function Tests: No results for input(s): AST, ALT, ALKPHOS, BILITOT, PROT, ALBUMIN in the last 168 hours. No results for input(s): LIPASE, AMYLASE in the last 168 hours. No results for input(s): AMMONIA in the last 168 hours. CBC: No results for input(s): WBC, NEUTROABS, HGB, HCT, MCV, PLT in the last  168 hours. Cardiac Enzymes: No results for input(s): CKTOTAL, CKMB, CKMBINDEX, TROPONINI in the last 168 hours.  BNP (last 3 results) No results for input(s): BNP in the last 8760 hours.  ProBNP (last 3 results) No results for input(s): PROBNP in the last 8760 hours.  CBG: No results for input(s): GLUCAP in the last 168 hours.  Radiological Exams on Admission: No results found.  EKG: Independently reviewed. Reviewed from [facility] and within normal limits    toxic metabolic encephalopathy-unclear etiology? PNA Get 2 vw CXR Start empiric Levaquin IV given PCN allergy Doesn't seem infectious etiology as no fever no chills no clinical s/s   There is a question of whether the patient took excess Benadryl however I am not clear as to what may have happened. It is noted that patient is on gabapentin. Prior hospital stay /2015 was significant for toxic metabolic encephalopathy as well where he was discontinued off of Ultram and Neurontin. His current med list does not show these medications on it however in Epic these medications show as being active. We will ask the pharmacist to determine if these active or not.--- He should not continue on these in the future. I would discontinue his Benadryl completely and only use Sarna ointment for his itching   he actually is at what looks like a potential baseline based on my last visit with him in September 2015 and he does not need oxygen at this time therefore we will discontinue the same   obstructive sleep apnea noncompliant with CPAP + baseline COPD I says he has a machine at home however he does not use it. If he desats overnight he will need to be put on home oxygen 3 L--please contact us if this occurs It is unlikely that the patient will comply with CPAP  End-stage renal disease Monday Wednesday Friday at [facility] since 2009-secondary to diabetes mellitus-I have notified nephrology on the dialysis line-patient will get dialysis  tomorrow hopefully  Moderate aortic stenosis-monitor  Type 2 diabetes mellitus-we will place on sliding scale coverage. 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 Continue Plavix 75  mg daily, will need Pravachol 40 mg daily at bedtime for secondary prevention I suspect his hallucinations and other issues may be related to either dementia or some psychological issue. If this persists we will obtain psychiatric input however at this stage he is calm and there are no issues at the bedside  Restless leg syndrome continue ropinirole 0.5 daily at bedtime-monitor mentation.  Hypertension continue Lopressor 50 3 times a day for systolic blood pressure above 180---the patient does have documented bradycardia on prior admissions so this may not be a good option going forward  Secondary hyperparathyroidism-continue PhosLo 2668 mg 3 times a day with meals, Sensipar 60 mg daily-follow phosphorus and other labs in the morning  Anemia of renal disease-continue cyanocobalamin mean-Chek T sat and other labs as per nephrology-appreciate input     Time spent:75  Nita Sells [facility] Pager [phone]  If 7PM-7AM, please contact night-coverage [email or web address] Password [facility] 05/17/2014, 5:26 PM

## 2014-05-18 LAB — BASIC METABOLIC PANEL
ANION GAP: 16 — AB (ref 5–15)
BUN: 59 mg/dL — ABNORMAL HIGH (ref 6–20)
CHLORIDE: 100 mmol/L — AB (ref 101–111)
CO2: 21 mmol/L — AB (ref 22–32)
Calcium: 10.2 mg/dL (ref 8.9–10.3)
Creatinine, Ser: 8.25 mg/dL — ABNORMAL HIGH (ref 0.61–1.24)
GFR calc Af Amer: 7 mL/min — ABNORMAL LOW (ref >60)
GFR calc non Af Amer: 6 mL/min — ABNORMAL LOW (ref >60)
Glucose, Bld: 236 mg/dL — ABNORMAL HIGH (ref 65–99)
POTASSIUM: 4.8 mmol/L (ref 3.5–5.1)
Sodium: 137 mmol/L (ref 135–145)

## 2014-05-18 LAB — CBC
HCT: 37.7 % — ABNORMAL LOW (ref 39.0–52.0)
Hemoglobin: 12 g/dL — ABNORMAL LOW (ref 13.0–17.0)
MCH: 30.1 pg (ref 26.0–34.0)
MCHC: 31.8 g/dL (ref 30.0–36.0)
MCV: 94.5 fL (ref 78.0–100.0)
Platelets: 141 10*3/uL — ABNORMAL LOW (ref 150–400)
RBC: 3.99 MIL/uL — ABNORMAL LOW (ref 4.22–5.81)
RDW: 13.2 % (ref 11.5–15.5)
WBC: 8.7 10*3/uL (ref 4.0–10.5)

## 2014-05-18 LAB — PROTIME-INR
INR: 1.15 (ref 0.00–1.49)
PROTHROMBIN TIME: 14.8 s (ref 11.6–15.2)

## 2014-05-18 LAB — GLUCOSE, CAPILLARY
GLUCOSE-CAPILLARY: 179 mg/dL — AB (ref 65–99)
GLUCOSE-CAPILLARY: 79 mg/dL (ref 65–99)
Glucose-Capillary: 164 mg/dL — ABNORMAL HIGH (ref 65–99)
Glucose-Capillary: 117 mg/dL — ABNORMAL HIGH (ref 65–99)

## 2014-05-18 LAB — HIV ANTIBODY (ROUTINE TESTING W REFLEX): HIV Screen 4th Generation wRfx: NONREACTIVE

## 2014-05-18 LAB — STREP PNEUMONIAE URINARY ANTIGEN: Strep Pneumo Urinary Antigen: NEGATIVE

## 2014-05-18 MED ORDER — DOXERCALCIFEROL 4 MCG/2ML IV SOLN
6.0000 ug | INTRAVENOUS | Status: DC
  Filled 2014-05-18: qty 4

## 2014-05-18 MED ORDER — ALTEPLASE 100 MG IV SOLR
4.0000 mg | Freq: Once | INTRAVENOUS | Status: AC
  Administered 2014-05-18: 4 mg
  Filled 2014-05-18 (×2): qty 4

## 2014-05-18 MED ORDER — RENA-VITE PO TABS
1.0000 | ORAL_TABLET | Freq: Every day | ORAL | Status: DC
  Administered 2014-05-18 – 2014-05-19 (×2): 1 via ORAL
  Filled 2014-05-18 (×3): qty 1

## 2014-05-18 MED ORDER — METOPROLOL TARTRATE 50 MG PO TABS: 50.0000 mg | ORAL_TABLET | Freq: Three times a day (TID) | ORAL | Status: AC | PRN

## 2014-05-18 NOTE — Progress Notes (Signed)
Patient given morning dose of insulin without food. In dialysis, patient given snacks. Upon arrival to 6E, blood sugar 79. Renal Carb Modified tray set aside for patient and patient being fed by wife at bedside. Vitals stable.  No complaints of pain.

## 2014-05-18 NOTE — Consult Note (Signed)
Indication for Consultation:  Management of ESRD/hemodialysis; anemia, hypertension/volume and secondary hyperparathyroidism  HPI: [name] is a 69 y.o. male who presented to [facility] ED yesterday from his outpt center for concerns of pt being very lethargic. He recieves HD MWF @ [place]- did not get HD yesterday. He reports he took one capsule yesterday for chronic leg pain but does not remember what medication it was. He doesn't understand why he needs to be in the hospital and wants to go home. He is easily distracted and a poor historian. Will arrange HD today and while he is admitted.   Past Medical History  Diagnosis Date  . CHF (congestive heart failure)   . CAD (coronary artery disease) 2006  . COPD (chronic obstructive pulmonary disease)   . Hyperlipidemia   . ESRD (end stage renal disease) on dialysis started 08/2006    Started HD in September 2008.  R arm AVG placed 2008 was ligated 2009 due to steal syndrome.  Gets HD in [place] on TTS schedule. ESRD was due to DM.    Marland Kitchen Type II diabetes mellitus     etiology of ESRD  . Diabetic blindness   . Hypertension   . MI, old 2006  . Asthma   . Pneumonia     "everytime I take a cold" (05/07/2012)  . Bronchiectasis   . GERD (gastroesophageal reflux disease)   . Stroke 2006    "numb on left side since"  05/07/2012  . On home oxygen therapy     "3L at night mostly; also prn" (05/07/2012)  . Cardiomegaly    Past Surgical History  Procedure Laterality Date  . Av fistula placement Right 09/2006    "used it once" (05/07/2012)  . Appendectomy  ~ 1958  . Insertion of dialysis catheter Left 09/2006    "chest" (05/07/2012)  . Total hip arthroplasty Left 1997  . Repair ankle ligament Left 1981  . Cataract extraction w/ intraocular lens implant Left ?2012   Family History  Problem Relation Age of Onset  . Diabetes Mellitus II Father    Social History:  reports that he quit smoking about 30 years ago. His smoking use included  Cigarettes. He has a 60 pack-year smoking history. He has never used smokeless tobacco. He reports that he drinks alcohol. He reports that he does not use illicit drugs. Allergies  Allergen Reactions  . Cefepime Itching  . Penicillins Hives and Swelling   Prior to Admission medications   Medication Sig Start Date End Date Taking? Authorizing Provider  albuterol (PROVENTIL HFA;VENTOLIN HFA) 108 (90 BASE) MCG/ACT inhaler Inhale 1 puff into the lungs every 6 (six) hours as needed for wheezing or shortness of breath.    Yes Historical Provider, MD  albuterol (PROVENTIL) (2.5 MG/3ML) 0.083% nebulizer solution Take 2.5 mg by nebulization every 6 (six) hours as needed for wheezing or shortness of breath.   Yes Historical Provider, MD  aspirin EC 325 MG tablet Take 325 mg by mouth daily.   Yes Historical Provider, MD  Brinzolamide-Brimonidine [facility]) 1-0.2 % SUSP Place 1 drop into both eyes 3 (three) times daily.   Yes Historical Provider, MD  calcium acetate (PHOSLO) 667 MG capsule Take 2,668 mg by mouth 3 (three) times daily with meals.    Yes Historical Provider, MD  calcium-vitamin D (OSCAL WITH D) 500-200 MG-UNIT per tablet Take 1 tablet by mouth daily with breakfast.    Yes Historical Provider, MD  cinacalcet (SENSIPAR) 60 MG tablet Take 60  mg by mouth daily.   Yes Historical Provider, MD  clopidogrel (PLAVIX) 75 MG tablet Take 75 mg by mouth daily with breakfast.   Yes Historical Provider, MD  cyanocobalamin 500 MCG tablet Take 500 mcg by mouth daily. Vitamin B12   Yes Historical Provider, MD  docusate sodium (COLACE) 100 MG capsule Take 100 mg by mouth at bedtime.    Yes Historical Provider, MD  folic acid-vitamin b complex-vitamin c-selenium-zinc (DIALYVITE) 3 MG TABS tablet Take 1 tablet by mouth daily.   Yes Historical Provider, MD  gabapentin (NEURONTIN) 300 MG capsule Take 300 mg by mouth 3 (three) times daily.   Yes Historical Provider, MD  insulin aspart (NOVOLOG) 100 UNIT/ML injection  Inject 2-8 Units into the skin 3 (three) times daily with meals. Per sliding scale 04/22/13  Yes Russella Dar, NP  insulin glargine (LANTUS) 100 UNIT/ML injection Inject 0.26 mLs (26 Units total) into the skin daily. Patient taking differently: Inject 30 Units into the skin 2 (two) times daily. Before breakfast and at bedtime 10/28/13  Yes Dawood S Elgergawy, MD  latanoprost (XALATAN) 0.005 % ophthalmic solution Place 1 drop into both eyes at bedtime.   Yes Historical Provider, MD  metoprolol (LOPRESSOR) 50 MG tablet Take 1 tablet (50 mg total) by mouth 3 (three) times daily as needed (as needed for systolic BP > 180). 04/22/13  Yes Lonia Blood, MD  midodrine (PROAMATINE) 5 MG tablet Take 10 mg by mouth See admin instructions. Take 2 tablets (10 mg) 3 times daily on Monday, Wednesday, Friday (dialysis days)   Yes Historical Provider, MD  omeprazole (PRILOSEC) 20 MG capsule Take 20 mg by mouth 2 (two) times daily.    Yes Historical Provider, MD  polyvinyl alcohol (LIQUIFILM TEARS) 1.4 % ophthalmic solution Place 1 drop into both eyes 3 (three) times daily as needed for dry eyes.    Yes Historical Provider, MD  pravastatin (PRAVACHOL) 40 MG tablet Take 40 mg by mouth at bedtime.    Yes Historical Provider, MD  rOPINIRole (REQUIP) 0.25 MG tablet Take 0.5 mg by mouth at bedtime.    Yes Historical Provider, MD  timolol (TIMOPTIC) 0.5 % ophthalmic solution Place 1 drop into both eyes daily.   Yes Historical Provider, MD  traMADol (ULTRAM) 50 MG tablet Take 50 mg by mouth every 6 (six) hours as needed for moderate pain.   Yes Historical Provider, MD  insulin glargine (LANTUS) 100 UNIT/ML injection Inject 0.2 mLs (20 Units total) into the skin at bedtime. Patient not taking: Reported on 05/17/2014 10/28/13   Starleen Arms, MD   Current Facility-Administered Medications  Medication Dose Route Frequency Provider Last Rate Last Dose  . albuterol (PROVENTIL) (2.5 MG/3ML) 0.083% nebulizer solution 2.5  mg  2.5 mg Nebulization Q6H PRN Rhetta Mura, MD      . calcium acetate (PHOSLO) capsule 2,668 mg  2,668 mg Oral TID WC Rhetta Mura, MD   2,668 mg at 05/18/14 0953  . calcium-vitamin D (OSCAL WITH D) 500-200 MG-UNIT per tablet 1 tablet  1 tablet Oral Q breakfast Rhetta Mura, MD   1 tablet at 05/18/14 0954  . cinacalcet (SENSIPAR) tablet 60 mg  60 mg Oral Q breakfast Rhetta Mura, MD   60 mg at 05/18/14 0954  . clopidogrel (PLAVIX) tablet 75 mg  75 mg Oral Q breakfast Rhetta Mura, MD   75 mg at 05/18/14 0954  . cyanocobalamin tablet 500 mcg  500 mcg Oral Daily Rhetta Mura, MD  500 mcg at 05/18/14 0953  . docusate sodium (COLACE) capsule 100 mg  100 mg Oral BID Rhetta Mura, MD   100 mg at 05/18/14 0954  . heparin injection 5,000 Units  5,000 Units Subcutaneous 3 times per day Rhetta Mura, MD   5,000 Units at 05/18/14 0602  . insulin aspart (novoLOG) injection 0-15 Units  0-15 Units Subcutaneous TID WC Rhetta Mura, MD   3 Units at 05/18/14 0955  . insulin aspart (novoLOG) injection 3 Units  3 Units Subcutaneous TID WC Rhetta Mura, MD   3 Units at 05/18/14 0955  . insulin glargine (LANTUS) injection 30 Units  30 Units Subcutaneous BID Rhetta Mura, MD   30 Units at 05/18/14 0954  . latanoprost (XALATAN) 0.005 % ophthalmic solution 1 drop  1 drop Both Eyes QHS Rhetta Mura, MD   1 drop at 05/17/14 2127  . metoprolol (LOPRESSOR) tablet 50 mg  50 mg Oral TID PRN Rhetta Mura, MD   50 mg at 05/18/14 0954  . [START ON 05/19/2014] midodrine (PROAMATINE) tablet 10 mg  10 mg Oral 3 times per day on Mon Wed Fri Rhetta Mura, MD      . pantoprazole (PROTONIX) EC tablet 40 mg  40 mg Oral Daily Rhetta Mura, MD   40 mg at 05/18/14 0953  . polyvinyl alcohol (LIQUIFILM TEARS) 1.4 % ophthalmic solution 1 drop  1 drop Both Eyes Daily PRN Rhetta Mura, MD      . pravastatin (PRAVACHOL) tablet 40 mg  40  mg Oral QHS Rhetta Mura, MD   40 mg at 05/17/14 2127  . rOPINIRole (REQUIP) tablet 0.5 mg  0.5 mg Oral QHS Rhetta Mura, MD   0.5 mg at 05/17/14 2127  . timolol (TIMOPTIC) 0.5 % ophthalmic solution 1 drop  1 drop Both Eyes Daily Rhetta Mura, MD   1 drop at 05/18/14 0956   Labs: Basic Metabolic Panel:  Recent Labs Lab 05/17/14 1924 05/18/14 0219  NA  --  137  K  --  4.8  CL  --  100*  CO2  --  21*  GLUCOSE  --  236*  BUN  --  59*  CREATININE 8.24* 8.25*  CALCIUM  --  10.2   Liver Function Tests: No results for input(s): AST, ALT, ALKPHOS, BILITOT, PROT, ALBUMIN in the last 168 hours. No results for input(s): LIPASE, AMYLASE in the last 168 hours. No results for input(s): AMMONIA in the last 168 hours. CBC:  Recent Labs Lab 05/17/14 1924 05/18/14 0219  WBC 9.7 8.7  HGB 12.4* 12.0*  HCT 38.5* 37.7*  MCV 93.4 94.5  PLT 188 141*   Cardiac Enzymes: No results for input(s): CKTOTAL, CKMB, CKMBINDEX, TROPONINI in the last 168 hours. CBG:  Recent Labs Lab 05/17/14 2107 05/18/14 0753  GLUCAP 193* 179*   Iron Studies: No results for input(s): IRON, TIBC, TRANSFERRIN, FERRITIN in the last 72 hours. Studies/Results: Dg Chest 2 View  05/17/2014   CLINICAL DATA:  Altered mental status, lethargy, shortness of breath for 1 day, multiple of occurrences of pneumonia, history CHF, coronary artery disease post MI, COPD, end-stage renal disease, type 2 diabetes, hypertension, asthma  EXAM: CHEST  2 VIEW  COMPARISON:  Earlier exam of 05/17/2014 at 1848 hours  FINDINGS: Severely rotated to the LEFT on AP view.  Enlargement of cardiac silhouette with pulmonary vascular congestion.  LEFT jugular catheter tip unchanged projecting over RIGHT atrium.  BILATERAL lower lobe atelectasis versus consolidation.  LEFT pleural effusion.  No pneumothorax.  IMPRESSION: LEFT pleural effusion with bibasilar atelectasis versus consolidation.  Little change.   Electronically Signed   By:  Ulyses Southward M.D.   On: 05/17/2014 19:38    Review of Systems: Reports chronic cough and SOB Denies chest pain.  Reports good appetite.  Reports he has been in a wheelchair for a long time  Physical Exam: Filed Vitals:   05/17/14 2000 05/18/14 0047 05/18/14 0246 05/18/14 0754  BP:  167/89 174/78   Pulse: 57 68 67   Temp:  97.4 F (36.3 C) 97.4 F (36.3 C) 98.3 F (36.8 C)  TempSrc:  Oral Oral Axillary  Resp: Height:      Weight:      SpO2: 93% 98% 96%      General: Well developed, well nourished, in no acute distress  Gurgling respirations heard w/o stethoscope Head: Normocephalic, atraumatic, sclera non-icteric, mucus membranes are moist Neck: Supple. JVD not elevated. Lungs: Shallow, bilat scattered rhonchi. Frequent wet sounding cough Fine rales R base> L Heart: RRR with S1 S2. No murmurs, rubs, or gallops appreciated. Abdomen: Soft, non-tender, non-distended with normoactive bowel sounds. No rebound/guarding. No obvious abdominal masses. M-S:  Strength and tone appear normal for age. Lower extremities: LE edema L>R 2+ pitting LE edema bilat Neuro: Alert and oriented X 3.  Psych:  Paranoid. Easily distracted.  Dialysis Access: L IJ cath  Dialysis Orders:  MWF ASh 4:30    105kgs   2K/2Ca+   L IJ cath    Profile 4 Hectorol 6  Assessment/Plan: 1. AMS- per primary. On Levaquin. Recent increase in gabapentin. Report of taking one capsule yesterday for pain but unsure what medication it was. blood cultures pending. Alert and oriented this AM. Afebrile 2. Pulm edema - by exam/ xray 3.  ESRD -  MWF [place], HD pending today as he missed yesterday 4.  Hypertension/volume  - 174/78 midodrine on HD days- under edw per wt last night- unable to get standing wts. L pleural effusion on chest xray- lower edw at DC. occasional high gains. LE edema. 5.  Anemia  - hgb 12 no ESA or Fe. Watch CBC 6.  Metabolic bone disease -  last PTH 314 (improved from 1500 in march) and phos  5.2- cont sensipar, hecotoral and phoslo.  7. Nutrition - renal diet. Vit.  8. Prior CVA- requires hoyer lift  Jetty Duhamel, NP [facility] [phone] 05/18/2014, 9:58 AM   Pt seen, examined, agree w assess/plan as above with additions as indicated. ESRD patient w AMS , prob medication related. Has pulm edema on exam/ cxr, asymptomatic. Has been losing wt intentionally for quite some time and this is probable cause of vol excess in ESRD pt. Plan HD today and tomorrow, try to get vol down and establish new dry wt.  Vinson Moselle MD pager [phone]    cell [phone] 05/18/2014, 12:33 PM

## 2014-05-18 NOTE — Progress Notes (Signed)
Report given to [place], RN 6E. Pt currently in dialysis.

## 2014-05-18 NOTE — Progress Notes (Deleted)
Physician Discharge Summary  [name] XOV:[id number] DOB: 08/26/1945 DOA: 05/17/2014  PCP: [name]., MD  Admit date: 05/17/2014 Discharge date: 05/18/2014  Time spent: 40 minutes  Recommendations for Outpatient Follow-up:  1.  to meds as below  Stop tramadol/Gabapentin 2. High risk decompensation if takes any neurally active med 3. Please re-inforce compliance on oxygen 4. Patient has refused to use CPAP 5. Total care at baseline   Discharge Diagnoses:  Active Problems:   Toxic metabolic encephalopathy   Discharge Condition: gaurded-high risk re-admission  Diet recommendation:  renal  Filed Weights   05/17/14 1757  Weight: 103.7 kg (228 lb 9.9 oz)    History of present illness:  69 y/o ? ESRD MWF @ [place] since 2009-regular Nephrologist is Dr. [name], known chronic ortho stasis on Midodrine, h/o remote GIB-[perianal-09/2012];prior Cardiomyopathy with cath 2011-echo 4/15=EF and 55% c Mod AoS, prior Barrett's esophagus, Ty2 IDDM, Prior CVA, R eye blindness-has been this way 3-4 yrs 2/2 to DM, Restless leg syndrome, OSA not using CPAP [uses only o2 at night-cannot tolerate nasal pillows or CPAP and refuses to try again] Admitted with toxic metabolic encephalopathy while at dialysis-has had hallucinations and seeing things since fall of 2015 Still takes tramadol?, Gabapentin? Although this was discontinued off of his MAR apparently At the emergency room at [facility] chest x-ray suggestive of pneumonia, and urinalysis suggestive of infection and because of metabolic encephalopathy transferred over to [facility] Course:   1.  Metabolic encephalopathy-unlikely infectious cause.  Discontinue IV Levaquin-chest x-ray done 5/16 does not really confirm pneumonia. I'm not impressed by his urine analysis. He had hypercarbia on his ABG and takes gabapentin and maybe tramadol. I have d/c'd these meds-he is stable for d/c as is at baseline 2. noncompliance on  CPAP-hypercarbia denotes risk for use of medications that decreased respiratory drive such as tramadol and gabapentin and patient with renal insufficiency.I discussed with his wife in detail--He will not comply c CPAP.  I have mentioned high risk death dying and met encephalopathy 3. 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 Moderate aortic stenosis-monitor 5. Hypotension associated with dialysis?-Continue Midrin 10 mg 3 times a day Monday Wednesday Friday 6. Prior CVA with right eye blindness/multi-infarct dementia-continue Plavix 75 daily, Pravachol 40 daily for secondary prevention. Although I do not see any hallucinations or other issues I suspect his multi-infarct dementia may cause some confusion.  Improved 7. Restless leg syndrome-continue ropinirole 0.5 daily daily at bedtime-monitor mentation 8. Hypertension-note above discussion #5-continue Lopressor 50 mg 3 times a day for blood pressure only above 180. Patient has documented bradycardia 9. Anemia of renal disease-not microcytic -per nephrology regarding T-Stat as well as replacement-  Consultations:  Nephrology  Discharge Exam: Filed Vitals:   05/18/14 1142  BP:   Pulse:   Temp: 98.5 F (36.9 C)  Resp:     General: eomi, ncat-somewhat less confused-seems at basleine Cardiovascular: s1 s 2nomr/g Respiratory: clear  Discharge Instructions   Discharge Instructions    Diet - low sodium heart healthy    Complete by:  As directed      Discharge instructions    Complete by:  As directed   Stop tramadol and gabapentin Follow up as OP with PCP to determine other modalities for pain management     Increase activity slowly    Complete by:  As directed  Current Discharge Medication List    CONTINUE these medications which have CHANGED   Details  metoprolol (LOPRESSOR) 50 MG tablet Take 1 tablet (50 mg total) by mouth 3  (three) times daily as needed (as needed for systolic BP > 407).      CONTINUE these medications which have NOT CHANGED   Details  albuterol (PROVENTIL HFA;VENTOLIN HFA) 108 (90 BASE) MCG/ACT inhaler Inhale 1 puff into the lungs every 6 (six) hours as needed for wheezing or shortness of breath.     albuterol (PROVENTIL) (2.5 MG/3ML) 0.083% nebulizer solution Take 2.5 mg by nebulization every 6 (six) hours as needed for wheezing or shortness of breath.    aspirin EC 325 MG tablet Take 325 mg by mouth daily.    Brinzolamide-Brimonidine (SIMBRINZA) 1-0.2 % SUSP Place 1 drop into both eyes 3 (three) times daily.    calcium acetate (PHOSLO) 667 MG capsule Take 2,668 mg by mouth 3 (three) times daily with meals.     calcium-vitamin D (OSCAL WITH D) 500-200 MG-UNIT per tablet Take 1 tablet by mouth daily with breakfast.     cinacalcet (SENSIPAR) 60 MG tablet Take 60 mg by mouth daily.    clopidogrel (PLAVIX) 75 MG tablet Take 75 mg by mouth daily with breakfast.    cyanocobalamin 500 MCG tablet Take 500 mcg by mouth daily. Vitamin B12    docusate sodium (COLACE) 100 MG capsule Take 100 mg by mouth at bedtime.     folic acid-vitamin b complex-vitamin c-selenium-zinc (DIALYVITE) 3 MG TABS tablet Take 1 tablet by mouth daily.    insulin aspart (NOVOLOG) 100 UNIT/ML injection Inject 2-8 Units into the skin 3 (three) times daily with meals. Per sliding scale    insulin glargine (LANTUS) 100 UNIT/ML injection Inject 0.26 mLs (26 Units total) into the skin daily. Qty: 10 mL, Refills: 11    latanoprost (XALATAN) 0.005 % ophthalmic solution Place 1 drop into both eyes at bedtime.    midodrine (PROAMATINE) 5 MG tablet Take 10 mg by mouth See admin instructions. Take 2 tablets (10 mg) 3 times daily on Monday, Wednesday, Friday (dialysis days)    omeprazole (PRILOSEC) 20 MG capsule Take 20 mg by mouth 2 (two) times daily.     polyvinyl alcohol (LIQUIFILM TEARS) 1.4 % ophthalmic solution Place 1  drop into both eyes 3 (three) times daily as needed for dry eyes.     pravastatin (PRAVACHOL) 40 MG tablet Take 40 mg by mouth at bedtime.     rOPINIRole (REQUIP) 0.25 MG tablet Take 0.5 mg by mouth at bedtime.     timolol (TIMOPTIC) 0.5 % ophthalmic solution Place 1 drop into both eyes daily.      STOP taking these medications     gabapentin (NEURONTIN) 300 MG capsule      traMADol (ULTRAM) 50 MG tablet        Allergies  Allergen Reactions  . Cefepime Itching  . Penicillins Hives and Swelling      The results of significant diagnostics from this hospitalization (including imaging, microbiology, ancillary and laboratory) are listed below for reference.    Significant Diagnostic Studies: Dg Chest 2 View  05/17/2014   CLINICAL DATA:  Altered mental status, lethargy, shortness of breath for 1 day, multiple of occurrences of pneumonia, history CHF, coronary artery disease post MI, COPD, end-stage renal disease, type 2 diabetes, hypertension, asthma  EXAM: CHEST  2 VIEW  COMPARISON:  Earlier exam of 05/17/2014 at 1848 hours  FINDINGS: Severely rotated  to the LEFT on AP view.  Enlargement of cardiac silhouette with pulmonary vascular congestion.  LEFT jugular catheter tip unchanged projecting over RIGHT atrium.  BILATERAL lower lobe atelectasis versus consolidation.  LEFT pleural effusion.  No pneumothorax.  IMPRESSION: LEFT pleural effusion with bibasilar atelectasis versus consolidation.  Little change.   Electronically Signed   By: Lavonia Dana M.D.   On: 05/17/2014 19:38    Microbiology: Recent Results (from the past 240 hour(s))  MRSA PCR Screening     Status: None   Collection Time: 05/17/14  5:33 PM  Result Value Ref Range Status   MRSA by PCR NEGATIVE NEGATIVE Final    Comment:        The GeneXpert MRSA Assay (FDA approved for NASAL specimens only), is one component of a comprehensive MRSA colonization surveillance program. It is not intended to diagnose MRSA infection  nor to guide or monitor treatment for MRSA infections.      Labs: Basic Metabolic Panel:  Recent Labs Lab 05/17/14 1924 05/18/14 0219  NA  --  137  K  --  4.8  CL  --  100*  CO2  --  21*  GLUCOSE  --  236*  BUN  --  59*  CREATININE 8.24* 8.25*  CALCIUM  --  10.2   Liver Function Tests: No results for input(s): AST, ALT, ALKPHOS, BILITOT, PROT, ALBUMIN in the last 168 hours. No results for input(s): LIPASE, AMYLASE in the last 168 hours. No results for input(s): AMMONIA in the last 168 hours. CBC:  Recent Labs Lab 05/17/14 1924 05/18/14 0219  WBC 9.7 8.7  HGB 12.4* 12.0*  HCT 38.5* 37.7*  MCV 93.4 94.5  PLT 188 141*   Cardiac Enzymes: No results for input(s): CKTOTAL, CKMB, CKMBINDEX, TROPONINI in the last 168 hours. BNP: BNP (last 3 results) No results for input(s): BNP in the last 8760 hours.  ProBNP (last 3 results) No results for input(s): PROBNP in the last 8760 hours.  CBG:  Recent Labs Lab 05/17/14 2107 05/18/14 0753 05/18/14 1139  GLUCAP 193* 179* 164*       Signed:  Nita Sells  [facility] 05/18/2014, 12:11 PM

## 2014-05-18 NOTE — Progress Notes (Signed)
Hemodialysis- Catheter not functioning well. Unable to achieve bfr >250. TPA instilled x 30 minutes without result. NP notified. Continue to monitor patient.

## 2014-05-18 NOTE — Discharge Summary (Signed)
Physician Discharge Summary  [name] QQP:[id number] DOB: 10-30-1945 DOA: 05/17/2014  PCP: [name]., MD  Admit date: 05/17/2014 Discharge date: 05/18/2014  Time spent: 40 minutes  Recommendations for Outpatient Follow-up:  1.  to meds as below  Stop tramadol/Gabapentin 2. High risk decompensation if takes any neurally active med 3. Please re-inforce compliance on oxygen 4. Patient has refused to use CPAP 5. Total care at baseline   Discharge Diagnoses:  Active Problems:   Toxic metabolic encephalopathy   Discharge Condition: gaurded-high risk re-admission  Diet recommendation:  renal  Filed Weights   05/17/14 1757  Weight: 103.7 kg (228 lb 9.9 oz)    History of present illness:  69 y/o ? ESRD MWF @ [facility] since 2009-regular Nephrologist is Dr. [name], known chronic ortho stasis on Midodrine, h/o remote GIB-[perianal-09/2012];prior Cardiomyopathy with cath 2011-echo 4/15=EF and 55% c Mod AoS, prior Barrett's esophagus, Ty2 IDDM, Prior CVA, R eye blindness-has been this way 3-4 yrs 2/2 to DM, Restless leg syndrome, OSA not using CPAP [uses only o2 at night-cannot tolerate nasal pillows or CPAP and refuses to try again] Admitted with toxic metabolic encephalopathy while at dialysis-has had hallucinations and seeing things since fall of 2015 Still takes tramadol?, Gabapentin? Although this was discontinued off of his MAR apparently At the emergency room at [facility] chest x-ray suggestive of pneumonia, and urinalysis suggestive of infection and because of metabolic encephalopathy transferred over to [facility] Course:   1.  Metabolic encephalopathy-unlikely infectious cause.  Discontinue IV Levaquin-chest x-ray done 5/16 does not really confirm pneumonia. I'm not impressed by his urine analysis. He had hypercarbia on his ABG and takes gabapentin and maybe tramadol. I have d/c'd these meds-he is stable for d/c as is at baseline 2. noncompliance on  CPAP-hypercarbia denotes risk for use of medications that decreased respiratory drive such as tramadol and gabapentin and patient with renal insufficiency.I discussed with his wife in detail--He will not comply c CPAP.  I have mentioned high risk death dying and met encephalopathy 3. 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 Moderate aortic stenosis-monitor 5. Hypotension associated with dialysis?-Continue Midrin 10 mg 3 times a day Monday Wednesday Friday 6. Prior CVA with right eye blindness/multi-infarct dementia-continue Plavix 75 daily, Pravachol 40 daily for secondary prevention. Although I do not see any hallucinations or other issues I suspect his multi-infarct dementia may cause some confusion.  Improved 7. Restless leg syndrome-continue ropinirole 0.5 daily daily at bedtime-monitor mentation 8. Hypertension-note above discussion #5-continue Lopressor 50 mg 3 times a day for blood pressure only above 180. Patient has documented bradycardia 9. Anemia of renal disease-not microcytic -per nephrology regarding T-Stat as well as replacement-  Consultations:  Nephrology  Discharge Exam: Filed Vitals:   05/18/14 1142  BP:   Pulse:   Temp: 98.5 F (36.9 C)  Resp:     General: eomi, ncat-somewhat less confused-seems at basleine Cardiovascular: s1 s 2nomr/g Respiratory: clear  Discharge Instructions   Discharge Instructions    Diet - low sodium heart healthy    Complete by:  As directed      Discharge instructions    Complete by:  As directed   Stop tramadol and gabapentin Follow up as OP with PCP to determine other modalities for pain management     Increase activity slowly    Complete by:  As directed  Current Discharge Medication List    CONTINUE these medications which have CHANGED   Details  metoprolol (LOPRESSOR) 50 MG tablet Take 1 tablet (50 mg total) by mouth 3  (three) times daily as needed (as needed for systolic BP > 858).      CONTINUE these medications which have NOT CHANGED   Details  albuterol (PROVENTIL HFA;VENTOLIN HFA) 108 (90 BASE) MCG/ACT inhaler Inhale 1 puff into the lungs every 6 (six) hours as needed for wheezing or shortness of breath.     albuterol (PROVENTIL) (2.5 MG/3ML) 0.083% nebulizer solution Take 2.5 mg by nebulization every 6 (six) hours as needed for wheezing or shortness of breath.    aspirin EC 325 MG tablet Take 325 mg by mouth daily.    Brinzolamide-Brimonidine (SIMBRINZA) 1-0.2 % SUSP Place 1 drop into both eyes 3 (three) times daily.    calcium acetate (PHOSLO) 667 MG capsule Take 2,668 mg by mouth 3 (three) times daily with meals.     calcium-vitamin D (OSCAL WITH D) 500-200 MG-UNIT per tablet Take 1 tablet by mouth daily with breakfast.     cinacalcet (SENSIPAR) 60 MG tablet Take 60 mg by mouth daily.    clopidogrel (PLAVIX) 75 MG tablet Take 75 mg by mouth daily with breakfast.    cyanocobalamin 500 MCG tablet Take 500 mcg by mouth daily. Vitamin B12    docusate sodium (COLACE) 100 MG capsule Take 100 mg by mouth at bedtime.     folic acid-vitamin b complex-vitamin c-selenium-zinc (DIALYVITE) 3 MG TABS tablet Take 1 tablet by mouth daily.    insulin aspart (NOVOLOG) 100 UNIT/ML injection Inject 2-8 Units into the skin 3 (three) times daily with meals. Per sliding scale    insulin glargine (LANTUS) 100 UNIT/ML injection Inject 0.26 mLs (26 Units total) into the skin daily. Qty: 10 mL, Refills: 11    latanoprost (XALATAN) 0.005 % ophthalmic solution Place 1 drop into both eyes at bedtime.    midodrine (PROAMATINE) 5 MG tablet Take 10 mg by mouth See admin instructions. Take 2 tablets (10 mg) 3 times daily on Monday, Wednesday, Friday (dialysis days)    omeprazole (PRILOSEC) 20 MG capsule Take 20 mg by mouth 2 (two) times daily.     polyvinyl alcohol (LIQUIFILM TEARS) 1.4 % ophthalmic solution Place 1  drop into both eyes 3 (three) times daily as needed for dry eyes.     pravastatin (PRAVACHOL) 40 MG tablet Take 40 mg by mouth at bedtime.     rOPINIRole (REQUIP) 0.25 MG tablet Take 0.5 mg by mouth at bedtime.     timolol (TIMOPTIC) 0.5 % ophthalmic solution Place 1 drop into both eyes daily.      STOP taking these medications     gabapentin (NEURONTIN) 300 MG capsule      traMADol (ULTRAM) 50 MG tablet        Allergies  Allergen Reactions  . Cefepime Itching  . Penicillins Hives and Swelling      The results of significant diagnostics from this hospitalization (including imaging, microbiology, ancillary and laboratory) are listed below for reference.    Significant Diagnostic Studies: Dg Chest 2 View  05/17/2014   CLINICAL DATA:  Altered mental status, lethargy, shortness of breath for 1 day, multiple of occurrences of pneumonia, history CHF, coronary artery disease post MI, COPD, end-stage renal disease, type 2 diabetes, hypertension, asthma  EXAM: CHEST  2 VIEW  COMPARISON:  Earlier exam of 05/17/2014 at 1848 hours  FINDINGS: Severely rotated  to the LEFT on AP view.  Enlargement of cardiac silhouette with pulmonary vascular congestion.  LEFT jugular catheter tip unchanged projecting over RIGHT atrium.  BILATERAL lower lobe atelectasis versus consolidation.  LEFT pleural effusion.  No pneumothorax.  IMPRESSION: LEFT pleural effusion with bibasilar atelectasis versus consolidation.  Little change.   Electronically Signed   By: Lavonia Dana M.D.   On: 05/17/2014 19:38    Microbiology: Recent Results (from the past 240 hour(s))  MRSA PCR Screening     Status: None   Collection Time: 05/17/14  5:33 PM  Result Value Ref Range Status   MRSA by PCR NEGATIVE NEGATIVE Final    Comment:        The GeneXpert MRSA Assay (FDA approved for NASAL specimens only), is one component of a comprehensive MRSA colonization surveillance program. It is not intended to diagnose MRSA infection  nor to guide or monitor treatment for MRSA infections.      Labs: Basic Metabolic Panel:  Recent Labs Lab 05/17/14 1924 05/18/14 0219  NA  --  137  K  --  4.8  CL  --  100*  CO2  --  21*  GLUCOSE  --  236*  BUN  --  59*  CREATININE 8.24* 8.25*  CALCIUM  --  10.2   Liver Function Tests: No results for input(s): AST, ALT, ALKPHOS, BILITOT, PROT, ALBUMIN in the last 168 hours. No results for input(s): LIPASE, AMYLASE in the last 168 hours. No results for input(s): AMMONIA in the last 168 hours. CBC:  Recent Labs Lab 05/17/14 1924 05/18/14 0219  WBC 9.7 8.7  HGB 12.4* 12.0*  HCT 38.5* 37.7*  MCV 93.4 94.5  PLT 188 141*   Cardiac Enzymes: No results for input(s): CKTOTAL, CKMB, CKMBINDEX, TROPONINI in the last 168 hours. BNP: BNP (last 3 results) No results for input(s): BNP in the last 8760 hours.  ProBNP (last 3 results) No results for input(s): PROBNP in the last 8760 hours.  CBG:  Recent Labs Lab 05/17/14 2107 05/18/14 0753 05/18/14 1139  GLUCAP 193* 179* 164*       Signed:  Nita Sells  [facility] 05/18/2014, 12:17 PM

## 2014-05-19 ENCOUNTER — Inpatient Hospital Stay (HOSPITAL_COMMUNITY): Payer: Medicare Other

## 2014-05-19 DIAGNOSIS — N186 End stage renal disease: Secondary | ICD-10-CM

## 2014-05-19 DIAGNOSIS — E162 Hypoglycemia, unspecified: Secondary | ICD-10-CM

## 2014-05-19 DIAGNOSIS — Z992 Dependence on renal dialysis: Secondary | ICD-10-CM

## 2014-05-19 DIAGNOSIS — G92 Toxic encephalopathy: Principal | ICD-10-CM

## 2014-05-19 DIAGNOSIS — G2581 Restless legs syndrome: Secondary | ICD-10-CM

## 2014-05-19 DIAGNOSIS — D631 Anemia in chronic kidney disease: Secondary | ICD-10-CM

## 2014-05-19 DIAGNOSIS — N189 Chronic kidney disease, unspecified: Secondary | ICD-10-CM

## 2014-05-19 LAB — LEGIONELLA ANTIGEN, URINE

## 2014-05-19 LAB — CBC
HEMATOCRIT: 37.6 % — AB (ref 39.0–52.0)
HEMOGLOBIN: 12.1 g/dL — AB (ref 13.0–17.0)
MCH: 30.3 pg (ref 26.0–34.0)
MCHC: 32.2 g/dL (ref 30.0–36.0)
MCV: 94.2 fL (ref 78.0–100.0)
Platelets: 203 10*3/uL (ref 150–400)
RBC: 3.99 MIL/uL — AB (ref 4.22–5.81)
RDW: 13.4 % (ref 11.5–15.5)
WBC: 10.3 10*3/uL (ref 4.0–10.5)

## 2014-05-19 LAB — RENAL FUNCTION PANEL
ANION GAP: 11 (ref 5–15)
Albumin: 2.8 g/dL — ABNORMAL LOW (ref 3.5–5.0)
BUN: 34 mg/dL — ABNORMAL HIGH (ref 6–20)
CHLORIDE: 102 mmol/L (ref 101–111)
CO2: 27 mmol/L (ref 22–32)
Calcium: 10.1 mg/dL (ref 8.9–10.3)
Creatinine, Ser: 6.21 mg/dL — ABNORMAL HIGH (ref 0.61–1.24)
GFR calc Af Amer: 10 mL/min — ABNORMAL LOW (ref >60)
GFR calc non Af Amer: 8 mL/min — ABNORMAL LOW (ref >60)
GLUCOSE: 33 mg/dL — AB (ref 65–99)
Phosphorus: 5.8 mg/dL — ABNORMAL HIGH (ref 2.5–4.6)
Potassium: 3.6 mmol/L (ref 3.5–5.1)
SODIUM: 140 mmol/L (ref 135–145)

## 2014-05-19 LAB — GLUCOSE, CAPILLARY
GLUCOSE-CAPILLARY: 55 mg/dL — AB (ref 65–99)
GLUCOSE-CAPILLARY: 57 mg/dL — AB (ref 65–99)
Glucose-Capillary: 65 mg/dL (ref 65–99)
Glucose-Capillary: 86 mg/dL (ref 65–99)
Glucose-Capillary: 100 mg/dL — ABNORMAL HIGH (ref 65–99)
Glucose-Capillary: 74 mg/dL (ref 65–99)
Glucose-Capillary: 103 mg/dL — ABNORMAL HIGH (ref 65–99)

## 2014-05-19 MED ORDER — DOXERCALCIFEROL 4 MCG/2ML IV SOLN
2.0000 ug | INTRAVENOUS | Status: DC
  Administered 2014-05-19: 2 ug via INTRAVENOUS

## 2014-05-19 MED ORDER — INSULIN GLARGINE 100 UNIT/ML ~~LOC~~ SOLN
20.0000 [IU] | Freq: Two times a day (BID) | SUBCUTANEOUS | Status: DC
Start: 2014-05-19 — End: 2014-05-20
  Administered 2014-05-19 – 2014-05-20 (×2): 20 [IU] via SUBCUTANEOUS
  Filled 2014-05-19 (×3): qty 0.2

## 2014-05-19 MED ORDER — HYDROCODONE-ACETAMINOPHEN 5-325 MG PO TABS: 1.0000 | ORAL_TABLET | ORAL | Status: DC | PRN

## 2014-05-19 MED ORDER — INSULIN GLARGINE 100 UNIT/ML ~~LOC~~ SOLN
28.0000 [IU] | Freq: Two times a day (BID) | SUBCUTANEOUS | Status: DC
  Filled 2014-05-19: qty 0.28

## 2014-05-19 MED ORDER — DOXERCALCIFEROL 4 MCG/2ML IV SOLN
INTRAVENOUS | Status: AC
  Filled 2014-05-19: qty 4

## 2014-05-19 MED ORDER — CHOLECALCIFEROL 10 MCG (400 UNIT) PO TABS
400.0000 [IU] | ORAL_TABLET | Freq: Every day | ORAL | Status: DC
  Administered 2014-05-20: 400 [IU] via ORAL
  Filled 2014-05-19 (×2): qty 1

## 2014-05-19 MED ORDER — SEVELAMER CARBONATE 800 MG PO TABS
1600.0000 mg | ORAL_TABLET | Freq: Three times a day (TID) | ORAL | Status: DC
  Administered 2014-05-19 (×2): 1600 mg via ORAL
  Filled 2014-05-19 (×9): qty 2

## 2014-05-19 MED ORDER — DEXTROSE 50 % IV SOLN
INTRAVENOUS | Status: AC
  Administered 2014-05-19: 50 mL
  Filled 2014-05-19: qty 50

## 2014-05-19 MED ORDER — HYDROCODONE-ACETAMINOPHEN 5-325 MG PO TABS
2.0000 | ORAL_TABLET | Freq: Once | ORAL | Status: AC
  Administered 2014-05-19: 2 via ORAL
  Filled 2014-05-19: qty 2

## 2014-05-19 NOTE — Progress Notes (Addendum)
  [facility] Progress Note   Subjective: no complaints  Filed Vitals:   05/19/14 0700 05/19/14 0730 05/19/14 0800 05/19/14 0829  BP: 138/65 113/59 120/63 163/69  Pulse: 82 78 82 79  Temp:      TempSrc:      Resp: 14 14 17 14   Height:      Weight:      SpO2:       Exam: Alert, a little confused No jvd Chest clear bilat RRR no MRG Abd soft ntnd no ascites 1+ LE edema bilat Neuro is nf, ox 3 LUA AVF patent  HD: MWF ASh 4:30 105kgs 2K/2Ca+ L IJ cath  Profile 4 Hectorol 6        Assessment: 1. AMS seems better 2. Fluid overload/ pulm edema / hypoxemia - improving w vol removal 3. Wt loss intentional according to pt 4. ESRD  5. Anemia no esa 6. MBD pth down 314 last, phos 5; adjCa high 10.9 > change binder to renvela, dec'd vit D, dc Ca supp  Plan - HD, cxr after dialysis    Vinson Moselle MD  pager [phone]    cell [phone]  05/19/2014, 9:18 AM     Recent Labs Lab 05/17/14 1924 05/18/14 0219  NA  --  137  K  --  4.8  CL  --  100*  CO2  --  21*  GLUCOSE  --  236*  BUN  --  59*  CREATININE 8.24* 8.25*  CALCIUM  --  10.2   No results for input(s): AST, ALT, ALKPHOS, BILITOT, PROT, ALBUMIN in the last 168 hours.  Recent Labs Lab 05/17/14 1924 05/18/14 0219 05/19/14 0857  WBC 9.7 8.7 10.3  HGB 12.4* 12.0* 12.1*  HCT 38.5* 37.7* 37.6*  MCV 93.4 94.5 94.2  PLT 188 141* 203   . calcium acetate  2,668 mg Oral TID WC  . calcium-vitamin D  1 tablet Oral Q breakfast  . cinacalcet  60 mg Oral Q breakfast  . clopidogrel  75 mg Oral Q breakfast  . cyanocobalamin  500 mcg Oral Daily  . docusate sodium  100 mg Oral BID  . doxercalciferol  6 mcg Intravenous Q M,W,F-HD  . heparin  5,000 Units Subcutaneous 3 times per day  . insulin aspart  0-15 Units Subcutaneous TID WC  . insulin aspart  3 Units Subcutaneous TID WC  . insulin glargine  30 Units Subcutaneous BID  . latanoprost  1 drop Both Eyes QHS  . midodrine  10 mg Oral 3  times per day on Mon Wed Fri  . multivitamin  1 tablet Oral QHS  . pantoprazole  40 mg Oral Daily  . pravastatin  40 mg Oral QHS  . rOPINIRole  0.5 mg Oral QHS  . timolol  1 drop Both Eyes Daily     albuterol, metoprolol, polyvinyl alcohol

## 2014-05-19 NOTE — Progress Notes (Signed)
[facility] PROGRESS NOTE  NIRVAAN FRETT UEA:[id number] DOB: Jul 27, 1945 DOA: 05/17/2014  PCP: [name], MD  Brief HPI: 69 year old Caucasian male with a past medical history of end-stage renal disease on hemodialysis, type 2 diabetes, prior stroke, history of sleep apnea, presented with complaints of confusion. There apparently had been medication changes recently, including increasing the dose of gabapentin. Patient may also have been taking Benadryl for itching. He was hospitalized for further management.  Past medical history:  Past Medical History  Diagnosis Date  . CHF (congestive heart failure)   . CAD (coronary artery disease) 2006  . COPD (chronic obstructive pulmonary disease)   . Hyperlipidemia   . ESRD (end stage renal disease) on dialysis started 08/2006    Started HD in September 2008.  R arm AVG placed 2008 was ligated 2009 due to steal syndrome.  Gets HD in [place] on TTS schedule. ESRD was due to DM.    Marland Kitchen Type II diabetes mellitus     etiology of ESRD  . Diabetic blindness   . Hypertension   . MI, old 2006  . Asthma   . Pneumonia     "everytime I take a cold" (05/07/2012)  . Bronchiectasis   . GERD (gastroesophageal reflux disease)   . Stroke 2006    "numb on left side since"  05/07/2012  . On home oxygen therapy     "3L at night mostly; also prn" (05/07/2012)  . Cardiomegaly     Consultants: Nephrology  Procedures: Dialysis  Antibiotics: None  Subjective: Patient seen during dialysis. Noted to be distracted. Denied any pain per se. No difficulty breathing.  Objective: Vital Signs  Filed Vitals:   05/19/14 0900 05/19/14 0930 05/19/14 1000 05/19/14 1025  BP: 123/68 139/74 135/84 133/73  Pulse: 78 80 82 85  Temp:      TempSrc:      Resp: Height:      Weight:      SpO2:        Intake/Output Summary (Last 24 hours) at 05/19/14 1103 Last data filed at 05/19/14 1025  Gross per 24 hour  Intake      0 ml  Output   6320 ml    Net  -6320 ml   Filed Weights   05/18/14 1252 05/18/14 2034 05/19/14 0643  Weight: 105 kg (231 lb 7.7 oz) 102 kg (224 lb 13.9 oz) 100.3 kg (221 lb 1.9 oz)    General appearance: alert, cooperative, appears stated age and no distress Resp: clear to auscultation bilaterally Cardio: regular rate and rhythm, S1, S2 normal, no murmur, click, rub or gallop GI: soft, non-tender; bowel sounds normal; no masses,  no organomegaly Extremities: extremities normal, atraumatic, no cyanosis or edema Neurologic: left sided weakness from previous stroke  Lab Results:  Basic Metabolic Panel:  Recent Labs Lab 05/17/14 1924 05/18/14 0219 05/19/14 0857  NA  --  137 140  K  --  4.8 3.6  CL  --  100* 102  CO2  --  21* 27  GLUCOSE  --  236* 33*  BUN  --  59* 34*  CREATININE 8.24* 8.25* 6.21*  CALCIUM  --  10.2 10.1  PHOS  --   --  5.8*   Liver Function Tests:  Recent Labs Lab 05/19/14 0857  ALBUMIN 2.8*   CBC:  Recent Labs Lab 05/17/14 1924 05/18/14 0219 05/19/14 0857  WBC 9.7 8.7 10.3  HGB 12.4* 12.0* 12.1*  HCT 38.5*  37.7* 37.6*  MCV 93.4 94.5 94.2  PLT 188 141* 203   CBG:  Recent Labs Lab 05/17/14 2107 05/18/14 0753 05/18/14 1139 05/18/14 1822 05/18/14 2133  GLUCAP 193* 179* 164* 79 117*    Recent Results (from the past 240 hour(s))  MRSA PCR Screening     Status: None   Collection Time: 05/17/14  5:33 PM  Result Value Ref Range Status   MRSA by PCR NEGATIVE NEGATIVE Final    Comment:        The GeneXpert MRSA Assay (FDA approved for NASAL specimens only), is one component of a comprehensive MRSA colonization surveillance program. It is not intended to diagnose MRSA infection nor to guide or monitor treatment for MRSA infections.   Culture, blood (routine x 2) Call MD if unable to obtain prior to antibiotics being given     Status: None (Preliminary result)   Collection Time: 05/17/14  7:24 PM  Result Value Ref Range Status   Specimen Description BLOOD  LEFT ANTECUBITAL  Final   Special Requests BOTTLES DRAWN AEROBIC AND ANAEROBIC 6CC EA  Final   Culture   Final           BLOOD CULTURE RECEIVED NO GROWTH TO DATE CULTURE WILL BE HELD FOR 5 DAYS BEFORE ISSUING A FINAL NEGATIVE REPORT Performed at [facility]    Report Status PENDING  Incomplete  Culture, blood (routine x 2) Call MD if unable to obtain prior to antibiotics being given     Status: None (Preliminary result)   Collection Time: 05/17/14  7:37 PM  Result Value Ref Range Status   Specimen Description BLOOD LEFT HAND  Final   Special Requests BOTTLES DRAWN AEROBIC ONLY 4CC  Final   Culture   Final           BLOOD CULTURE RECEIVED NO GROWTH TO DATE CULTURE WILL BE HELD FOR 5 DAYS BEFORE ISSUING A FINAL NEGATIVE REPORT Performed at [facility]    Report Status PENDING  Incomplete      Studies/Results: Dg Chest 2 View  05/17/2014   CLINICAL DATA:  Altered mental status, lethargy, shortness of breath for 1 day, multiple of occurrences of pneumonia, history CHF, coronary artery disease post MI, COPD, end-stage renal disease, type 2 diabetes, hypertension, asthma  EXAM: CHEST  2 VIEW  COMPARISON:  Earlier exam of 05/17/2014 at 1848 hours  FINDINGS: Severely rotated to the LEFT on AP view.  Enlargement of cardiac silhouette with pulmonary vascular congestion.  LEFT jugular catheter tip unchanged projecting over RIGHT atrium.  BILATERAL lower lobe atelectasis versus consolidation.  LEFT pleural effusion.  No pneumothorax.  IMPRESSION: LEFT pleural effusion with bibasilar atelectasis versus consolidation.  Little change.   Electronically Signed   By: Ulyses Southward M.D.   On: 05/17/2014 19:38    Medications:  Scheduled: . [START ON 05/20/2014] cholecalciferol  400 Units Oral Q breakfast  . cinacalcet  60 mg Oral Q breakfast  . clopidogrel  75 mg Oral Q breakfast  . cyanocobalamin  500 mcg Oral Daily  . docusate sodium  100 mg Oral BID  . doxercalciferol  2 mcg Intravenous  Q M,W,F-HD  . heparin  5,000 Units Subcutaneous 3 times per day  . insulin aspart  0-15 Units Subcutaneous TID WC  . insulin aspart  3 Units Subcutaneous TID WC  . insulin glargine  28 Units Subcutaneous BID  . latanoprost  1 drop Both Eyes QHS  . midodrine  10 mg Oral  3 times per day on Mon Wed Fri  . multivitamin  1 tablet Oral QHS  . pantoprazole  40 mg Oral Daily  . pravastatin  40 mg Oral QHS  . rOPINIRole  0.5 mg Oral QHS  . sevelamer carbonate  1,600 mg Oral TID WC  . timolol  1 drop Both Eyes Daily   Continuous:  [id number], metoprolol, polyvinyl alcohol  Assessment/Plan:  Active Problems:   Toxic metabolic encephalopathy    Acute encephalopathy Most likely metabolic or medication induced. Unlikely to be infectious. Gabapentin and tramadol have been discontinued. Appears to be improving.  History of obstructive sleep apnea There is report of noncompliance with CPAP. This could also be contributing, as the patient may have increased daytime somnolence.  ESRD M/W/F Nephrology's following. They believe that the patient may be fluid overloaded. They wanted to dialyze him yesterday and today. Repeat chest x-ray later today. Further dialysis needs to be determined after that. PT and OT will be consulted.  Moderate aortic stenosis Stable. Continue to monitor.  Hypotension associated with dialysis? Continue Midrin 10 mg 3 times a day Monday Wednesday Friday  Prior CVA with right eye blindness/multi-infarct dementia Continue Plavix 75 daily, Pravachol 40 daily for secondary prevention. Suspect his multi-infarct dementia may cause some confusion.  Restless leg syndrome Continue ropinirole 0.5 daily daily at bedtime-monitor mentation  Essential hypertension  Continue Lopressor 50 mg 3 times a day for blood pressure only above 180. Patient has documented bradycardia  Anemia of renal disease Hemoglobin is stable.  Hypoglycemia in the setting of type 2  diabetes Reduce dose of Lantus. Likely due to poor oral intake. Continue to monitor.  DVT Prophylaxis: Heparin    Code Status: Full code  Family Communication: Discussed with the patient  Disposition Plan: Possible discharge tomorrow    LOS: 2 days   [facility]  [facility] Pager [phone] 05/19/2014, 11:03 AM  If 7PM-7AM, please contact night-coverage at [email or web address], password [facility]

## 2014-05-19 NOTE — Progress Notes (Signed)
Hypoglycemic Event  CBG: 57  Treatment: 15 GM carbohydrate snack  Symptoms: Nervous/irritable  Follow-up CBG: Time:1710 CBG Result:74  Possible Reasons for Event: Inadequate meal intake  Comments/MD notified:Dr. John Giovanni M  Remember to initiate Hypoglycemia Order Set & complete

## 2014-05-19 NOTE — Progress Notes (Signed)
OT Cancellation Note  Patient Details Name: [name] MRN: [id number] DOB: July 18, 1945   Cancelled Treatment:    Reason Eval/Treat Not Completed: Patient at procedure or test/ unavailable (HD). Will follow.  Evern Bio 05/19/2014, 10:44 AM

## 2014-05-19 NOTE — Progress Notes (Signed)
Lab called in a critical glucose of 33; rechecked soon after the call CBG=55; administered D50 and apple juice CBG=86. Shea Stakes, RN on the floor made aware during report on 0910

## 2014-05-19 NOTE — Progress Notes (Signed)
Physical Therapy Evaluation Patient Details Name: [name] MRN: [id number] DOB: 21-Dec-1945 Today's Date: 05/19/2014   History of Present Illness  69 y/o male ESRD MWF @ [facility] since 2009-regular Nephrologist is Dr. 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 Pt currently with functional limitations due to the deficits listed below (see PT Problem List). Unreliable historian, so will need more reliable information about home setup and available home assist;  Pt will benefit from skilled PT to increase their independence and safety with mobility to allow discharge to the venue listed below.       Follow Up Recommendations SNF    Equipment Recommendations  None recommended by PT    Recommendations for Other Services       Precautions / Restrictions Precautions Precautions: Fall      Mobility  Bed Mobility Overal bed mobility: Needs Assistance;+2 for physical assistance;+ 2 for safety/equipment Bed Mobility: Rolling;Sidelying to Sit;Sit to Supine Rolling: Mod assist Sidelying to sit: +2 for physical assistance;Mod assist   Sit to supine: +2 for physical assistance;Max assist   General bed mobility comments: Cues to initiate and for technique and to reach for rails; light mod assis to roll; Heavy mod assist to elevate trunk to sit  Transfers Overall transfer level: Needs assistance Equipment used:  (Gait belt and knees blocked) Transfers: Sit to/from Stand;Lateral/Scoot Transfers Sit to Stand: +2 physical assistance;Total assist         General  transfer comment: minimal to no effort from pt with attempt to stand, or to shift weight onto feet to lateral scoot; unsuccessful attempt at transfer  Ambulation/Gait                Stairs            Wheelchair Mobility    Modified Rankin (Stroke Patients Only)       Balance Overall balance assessment: Needs assistance Sitting-balance support: Bilateral upper extremity supported Sitting balance-Leahy Scale: Fair                                       Pertinent Vitals/Pain Pain Assessment: Faces Faces Pain Scale: Hurts little more Pain Location: unable to state, though complained of back itching Pain Descriptors / Indicators: Grimacing Pain Intervention(s): Monitored during session    Home Living Family/patient expects to be discharged to:: Skilled nursing facility Living Arrangements: Spouse/significant other Available Help at Discharge: Family;Available 24 hours/day Type of Home: Mobile home Home Access: Ramped entrance     Home Layout: One level Home Equipment: Bedside commode;Wheelchair - Technical sales engineer - 4 wheels;Walker - 2 wheels Additional Comments: Much of the above information taken from PT note 6 months ago    Prior Function Level of Independence: Needs assistance   Gait / Transfers Assistance Needed: Wife assists with transfers. Pt states he is either in the bed or in the wheelchair during the day.   ADL's / Homemaking Assistance Needed: pt reports wife assists with bathing/dressing, stating he cannot reach below his knees. Sponge bath only.  Comments: above information gleaned from chart; taken from PT note from 6 monthws ago     Hand Dominance   Dominant Hand: Right    Extremity/Trunk Assessment   Upper Extremity Assessment: Generalized weakness           Lower Extremity Assessment: Generalized weakness         Communication   Communication: No difficulties  Cognition Arousal/Alertness:  Awake/alert Behavior During Therapy: WFL for tasks assessed/performed Overall Cognitive Status: No family/caregiver present to determine baseline cognitive functioning                      General Comments General comments (skin integrity, edema, etc.): Pt reproting need to move his bowels, so laid back down and placed him on bedpan    Exercises        Assessment/Plan    PT Assessment Patient needs continued PT services  PT Diagnosis Difficulty walking;Generalized weakness   PT Problem List Decreased strength;Decreased range of motion;Decreased activity tolerance;Decreased balance;Decreased mobility;Decreased coordination;Decreased cognition;Decreased knowledge of use of DME;Decreased safety awareness  PT Treatment Interventions DME instruction;Functional mobility training;Therapeutic activities;Therapeutic exercise;Neuromuscular re-education;Cognitive remediation;Patient/family education;Wheelchair mobility training   PT Goals (Current goals can be found in the Care Plan section) Acute Rehab PT Goals Patient Stated Goal: did not state PT Goal Formulation: Patient unable to participate in goal setting Time For Goal Achievement: 06/02/14 Potential to Achieve Goals: Good    Frequency Min 3X/week   Barriers to discharge   Need more reliable information re: home situation and available family assist    Co-evaluation               End of Session Equipment Utilized During Treatment: Gait belt Activity Tolerance: Patient tolerated treatment well Patient left: in bed;with call bell/phone within reach;with nursing/sitter in room Nurse Communication: Mobility status         Time: 2952-8413 PT Time Calculation (min) (ACUTE ONLY): 19 min   Charges:   PT Evaluation $Initial PT Evaluation Tier I: 1 Procedure     PT G CodesOlen Pel 05/19/2014, 4:36 PM  Van Clines, PT  Acute Rehabilitation Services Pager [phone] Office [phone]

## 2014-05-20 DIAGNOSIS — E1122 Type 2 diabetes mellitus with diabetic chronic kidney disease: Secondary | ICD-10-CM | POA: Insufficient documentation

## 2014-05-20 LAB — GLUCOSE, CAPILLARY
GLUCOSE-CAPILLARY: 182 mg/dL — AB (ref 65–99)
Glucose-Capillary: 89 mg/dL (ref 65–99)

## 2014-05-20 MED ORDER — LIDOCAINE HCL (PF) 1 % IJ SOLN: 5.0000 mL | INTRAMUSCULAR | Status: DC | PRN

## 2014-05-20 MED ORDER — PENTAFLUOROPROP-TETRAFLUOROETH EX AERO: 1.0000 "application " | INHALATION_SPRAY | CUTANEOUS | Status: DC | PRN

## 2014-05-20 MED ORDER — NEPRO/CARBSTEADY PO LIQD: 237.0000 mL | ORAL | Status: DC | PRN

## 2014-05-20 MED ORDER — INSULIN GLARGINE 100 UNIT/ML ~~LOC~~ SOLN: 20.0000 [IU] | Freq: Every day | SUBCUTANEOUS | Status: AC

## 2014-05-20 MED ORDER — HEPARIN SODIUM (PORCINE) 1000 UNIT/ML DIALYSIS: 1000.0000 [IU] | INTRAMUSCULAR | Status: DC | PRN

## 2014-05-20 MED ORDER — SEVELAMER CARBONATE 800 MG PO TABS: 1600.0000 mg | ORAL_TABLET | Freq: Three times a day (TID) | ORAL | Status: AC

## 2014-05-20 MED ORDER — LIDOCAINE-PRILOCAINE 2.5-2.5 % EX CREA: 1.0000 "application " | TOPICAL_CREAM | CUTANEOUS | Status: DC | PRN

## 2014-05-20 MED ORDER — SODIUM CHLORIDE 0.9 % IV SOLN: 100.0000 mL | INTRAVENOUS | Status: DC | PRN

## 2014-05-20 MED ORDER — ALTEPLASE 2 MG IJ SOLR
2.0000 mg | Freq: Once | INTRAMUSCULAR | Status: DC | PRN
  Filled 2014-05-20: qty 2

## 2014-05-20 NOTE — Progress Notes (Signed)
  [name] Progress Note   Subjective: no complaints, breathing better. CXR yest clear of edema  Filed Vitals:   05/20/14 0700 05/20/14 0730 05/20/14 0800 05/20/14 0830  BP: 183/87 142/75 150/59 151/72  Pulse: 66 65 68 68  Temp:      TempSrc:      Resp:      Height:      Weight:      SpO2:       Exam: Alert, a little confused No jvd Chest clear bilat RRR no MRG Abd soft ntnd no ascites 1+ LE edema bilat Neuro is nf, ox 3 LUA AVF patent  HD: MWF ASh 4:30 105kgs 2K/2Ca+ L IJ cath  Profile 4 Hectorol 6        Assessment: 1. AMS seems better 2. Fluid overload/ pulm edema / hypoxemia - resolved w vol removal 3. Wt loss intentional according to pt 4. ESRD  5. Anemia no esa 6. MBD pth down 314 last, phos 5; adjCa high 10.9 > change binder to renvela, dec'd vit D, dc Ca supp  Plan - HD today, ok for dc after HD, will lower dry wt    Vinson Moselle MD  pager [phone]    cell [phone]  05/20/2014, 9:15 AM     Recent Labs Lab 05/17/14 1924 05/18/14 0219 05/19/14 0857  NA  --  137 140  K  --  4.8 3.6  CL  --  100* 102  CO2  --  21* 27  GLUCOSE  --  236* 33*  BUN  --  59* 34*  CREATININE 8.24* 8.25* 6.21*  CALCIUM  --  10.2 10.1  PHOS  --   --  5.8*    Recent Labs Lab 05/19/14 0857  ALBUMIN 2.8*    Recent Labs Lab 05/17/14 1924 05/18/14 0219 05/19/14 0857  WBC 9.7 8.7 10.3  HGB 12.4* 12.0* 12.1*  HCT 38.5* 37.7* 37.6*  MCV 93.4 94.5 94.2  PLT 188 141* 203   . cholecalciferol  400 Units Oral Q breakfast  . cinacalcet  60 mg Oral Q breakfast  . clopidogrel  75 mg Oral Q breakfast  . cyanocobalamin  500 mcg Oral Daily  . docusate sodium  100 mg Oral BID  . doxercalciferol  2 mcg Intravenous Q M,W,F-HD  . heparin  5,000 Units Subcutaneous 3 times per day  . insulin aspart  0-15 Units Subcutaneous TID WC  . insulin aspart  3 Units Subcutaneous TID WC  . insulin glargine  20 Units Subcutaneous BID  . latanoprost  1  drop Both Eyes QHS  . midodrine  10 mg Oral 3 times per day on Mon Wed Fri  . multivitamin  1 tablet Oral QHS  . pantoprazole  40 mg Oral Daily  . pravastatin  40 mg Oral QHS  . rOPINIRole  0.5 mg Oral QHS  . sevelamer carbonate  1,600 mg Oral TID WC  . timolol  1 drop Both Eyes Daily     sodium chloride, sodium chloride, albuterol, alteplase, feeding supplement (NEPRO CARB STEADY), heparin, HYDROcodone-acetaminophen, lidocaine (PF), lidocaine-prilocaine, metoprolol, pentafluoroprop-tetrafluoroeth, polyvinyl alcohol

## 2014-05-20 NOTE — Discharge Summary (Signed)
[facility]  Physician Discharge Summary   Patient ID: FILIBERTO WAMBLE MRN: [id number] DOB/AGE: [birth year] 68 y.o.  Admit date: 05/17/2014 Discharge date: 05/20/2014  PCP: [name], MD  DISCHARGE DIAGNOSES:  Active Problems:   Toxic metabolic encephalopathy   Type 2 diabetes mellitus with diabetic chronic kidney disease   RECOMMENDATIONS FOR OUTPATIENT FOLLOW UP: 1. Home health has been arranged  DISCHARGE CONDITION: fair  Diet recommendation: Modified carbohydrate  Filed Weights   05/19/14 2120 05/20/14 0957 05/20/14 1212  Weight: 101 kg (222 lb 10.6 oz) 95.4 kg (210 lb 5.1 oz) 99.973 kg (220 lb 6.4 oz)    INITIAL HISTORY: 69 year old Caucasian male with a past medical history of end-stage renal disease on hemodialysis, type 2 diabetes, prior stroke, history of sleep apnea, presented with complaints of confusion. There apparently had been medication changes recently, including increasing the dose of gabapentin. Patient may also have been taking Benadryl for itching. He was hospitalized for further management.  Consultations:  Nephrology  Procedures:  Dialysis on May 17, 18 and 19  HOSPITAL COURSE:   Acute encephalopathy Most likely metabolic or medication induced. Unlikely to be infectious. Patient's Gabapentin and tramadol have been discontinued. His mental status is now back to baseline.   History of obstructive sleep apnea There is report of noncompliance with CPAP. This could also be contributing, as the patient may have increased daytime somnolence. He was told to use his CPAP machine consistently.  ESRD M/W/F Nephrology was consulted. Patient's chest x-ray suggested volume overload. He was dialyzed for 3 days in a row. Volume status has significantly improved. He will still need to go for his regular dialysis tomorrow, but he will get a short treatment.   Moderate aortic stenosis Stable.   Hypotension associated with dialysis Continue Midrin  10 mg 3 times a day Monday Wednesday Friday  Prior CVA with right eye blindness/multi-infarct dementia Continue Plavix 75 daily, Pravachol 40 daily for secondary prevention. Suspect his multi-infarct dementia may cause some confusion.  Restless leg syndrome Continue ropinirole 0.5 daily daily at bedtime-monitor mentation  Essential hypertension  Continue Lopressor 50 mg. Patient has documented bradycardia  Anemia of renal disease Hemoglobin is stable.  Hypoglycemia in the setting of type 2 diabetes Patient appears to be getting more Lantus here in the hospital than he was getting at home. He continued to have low blood sugars. He'll be asked to resume his usual home dose of once a day. For unclear reasons patient was getting Lantus twice a day in the hospital. He was asked to keep a close check on his blood glucose levels.  Patient is improved. He was seen by physical therapy and occupational therapy. They recommended placement to a skilled nursing facility. Patient has steadfastly declined. This was also discussed with his wife who also feels that the patient would do okay at home with her help. She also tells me that at the end of this month he will be starting a rehabilitation program at the [place]. Home health was set up and the patient was discharged home.   PERTINENT LABS:  The results of significant diagnostics from this hospitalization (including imaging, microbiology, ancillary and laboratory) are listed below for reference.    Microbiology: Recent Results (from the past 240 hour(s))  MRSA PCR Screening     Status: None   Collection Time: 05/17/14  5:33 PM  Result Value Ref Range Status   MRSA by PCR NEGATIVE NEGATIVE Final    Comment:  The GeneXpert MRSA Assay (FDA approved for NASAL specimens only), is one component of a comprehensive MRSA colonization surveillance program. It is not intended to diagnose MRSA infection nor to guide or monitor treatment for MRSA  infections.   Culture, blood (routine x 2) Call MD if unable to obtain prior to antibiotics being given     Status: None (Preliminary result)   Collection Time: 05/17/14  7:24 PM  Result Value Ref Range Status   Specimen Description BLOOD LEFT ANTECUBITAL  Final   Special Requests BOTTLES DRAWN AEROBIC AND ANAEROBIC 6CC EA  Final   Culture   Final           BLOOD CULTURE RECEIVED NO GROWTH TO DATE CULTURE WILL BE HELD FOR 5 DAYS BEFORE ISSUING A FINAL NEGATIVE REPORT Performed at [facility]    Report Status PENDING  Incomplete  Culture, blood (routine x 2) Call MD if unable to obtain prior to antibiotics being given     Status: None (Preliminary result)   Collection Time: 05/17/14  7:37 PM  Result Value Ref Range Status   Specimen Description BLOOD LEFT HAND  Final   Special Requests BOTTLES DRAWN AEROBIC ONLY 4CC  Final   Culture   Final           BLOOD CULTURE RECEIVED NO GROWTH TO DATE CULTURE WILL BE HELD FOR 5 DAYS BEFORE ISSUING A FINAL NEGATIVE REPORT Performed at [facility]    Report Status PENDING  Incomplete     Labs: Basic Metabolic Panel:  Recent Labs Lab 05/17/14 1924 05/18/14 0219 05/19/14 0857  NA  --  137 140  K  --  4.8 3.6  CL  --  100* 102  CO2  --  21* 27  GLUCOSE  --  236* 33*  BUN  --  59* 34*  CREATININE 8.24* 8.25* 6.21*  CALCIUM  --  10.2 10.1  PHOS  --   --  5.8*   Liver Function Tests:  Recent Labs Lab 05/19/14 0857  ALBUMIN 2.8*   CBC:  Recent Labs Lab 05/17/14 1924 05/18/14 0219 05/19/14 0857  WBC 9.7 8.7 10.3  HGB 12.4* 12.0* 12.1*  HCT 38.5* 37.7* 37.6*  MCV 93.4 94.5 94.2  PLT 188 141* 203   CBG:  Recent Labs Lab 05/19/14 1643 05/19/14 1705 05/19/14 2117 05/20/14 0928 05/20/14 1236  GLUCAP 57* 74 103* 89 182*     IMAGING STUDIES Dg Chest 2 View  05/17/2014   CLINICAL DATA:  Altered mental status, lethargy, shortness of breath for 1 day, multiple of occurrences of pneumonia, history CHF,  coronary artery disease post MI, COPD, end-stage renal disease, type 2 diabetes, hypertension, asthma  EXAM: CHEST  2 VIEW  COMPARISON:  Earlier exam of 05/17/2014 at 1848 hours  FINDINGS: Severely rotated to the LEFT on AP view.  Enlargement of cardiac silhouette with pulmonary vascular congestion.  LEFT jugular catheter tip unchanged projecting over RIGHT atrium.  BILATERAL lower lobe atelectasis versus consolidation.  LEFT pleural effusion.  No pneumothorax.  IMPRESSION: LEFT pleural effusion with bibasilar atelectasis versus consolidation.  Little change.   Electronically Signed   By: Ulyses Southward M.D.   On: 05/17/2014 19:38   Dg Chest Port 1 View  05/19/2014   CLINICAL DATA:  Followup from dialysis. No current chest complaints.  EXAM: PORTABLE CHEST - 1 VIEW  COMPARISON:  05/17/2014  FINDINGS: Left greater than right lung base opacity is noted likely atelectasis. Small left pleural effusion.  Mild interstitial prominence without overt edema. No pneumothorax.  Cardiac silhouette normal in size.  No mediastinal or hilar masses.  Left internal jugular dual-lumen tunneled central venous catheter is stable and well positioned.  IMPRESSION: 1. No evidence of pneumonia or pulmonary edema. 2. Left greater right lung base opacity similar to the prior exam. This is consistent with atelectasis and, on the left, small pleural effusion.   Electronically Signed   By: Amie Portland M.D.   On: 05/19/2014 13:10    DISCHARGE EXAMINATION: Filed Vitals:   05/20/14 0900 05/20/14 0930 05/20/14 0957 05/20/14 1212  BP: 111/60 98/57 123/57   Pulse: 73 80 74   Temp:   97.9 F (36.6 C)   TempSrc:   Oral   Resp:   16   Height:      Weight:   95.4 kg (210 lb 5.1 oz) 99.973 kg (220 lb 6.4 oz)  SpO2:   96%    General appearance: alert, cooperative, appears stated age and no distress Resp: clear to auscultation bilaterally Cardio: regular rate and rhythm, S1, S2 normal, no murmur, click, rub or gallop GI: soft, non-tender;  bowel sounds normal; no masses,  no organomegaly  DISPOSITION: Home with home health  Discharge Instructions    Call MD for:  difficulty breathing, headache or visual disturbances    Complete by:  As directed      Call MD for:  persistant dizziness or light-headedness    Complete by:  As directed      Call MD for:  persistant nausea and vomiting    Complete by:  As directed      Call MD for:  severe uncontrolled pain    Complete by:  As directed      Call MD for:  temperature >100.4    Complete by:  As directed      Diet - low sodium heart healthy    Complete by:  As directed      Diet Carb Modified    Complete by:  As directed      Discharge instructions    Complete by:  As directed   Stop tramadol and gabapentin Follow up as OP with PCP to determine other modalities for pain management     Discharge instructions    Complete by:  As directed   Please go for dialysis tomorrow as per schedule.  You were cared for by a hospitalist during your hospital stay. If you have any questions about your discharge medications or the care you received while you were in the hospital after you are discharged, you can call the unit and asked to speak with the hospitalist on call if the hospitalist that took care of you is not available. Once you are discharged, your primary care physician will handle any further medical issues. Please note that NO REFILLS for any discharge medications will be authorized once you are discharged, as it is imperative that you return to your primary care physician (or establish a relationship with a primary care physician if you do not have one) for your aftercare needs so that they can reassess your need for medications and monitor your lab values. If you do not have a primary care physician, you can call [phone] for a physician referral.     Increase activity slowly    Complete by:  As directed      Increase activity slowly    Complete by:  As directed  ALLERGIES:  Allergies  Allergen Reactions  . Cefepime Itching  . Penicillins Hives and Swelling    Discharge Medication List as of 05/20/2014 12:55 PM    START taking these medications   Details  sevelamer carbonate (RENVELA) 800 MG tablet Take 2 tablets (1,600 mg total) by mouth 3 (three) times daily with meals., Starting 05/20/2014, Until Discontinued, Print      CONTINUE these medications which have CHANGED   Details  insulin glargine (LANTUS) 100 UNIT/ML injection Inject 0.2 mLs (20 Units total) into the skin daily., Starting 05/20/2014, Until Discontinued, Print    metoprolol (LOPRESSOR) 50 MG tablet Take 1 tablet (50 mg total) by mouth 3 (three) times daily as needed (as needed for systolic BP > 180)., Starting 05/18/2014, Until Discontinued, No Print      CONTINUE these medications which have NOT CHANGED   Details  albuterol (PROVENTIL HFA;VENTOLIN HFA) 108 (90 BASE) MCG/ACT inhaler Inhale 1 puff into the lungs every 6 (six) hours as needed for wheezing or shortness of breath. , Until Discontinued, Historical Med    albuterol (PROVENTIL) (2.5 MG/3ML) 0.083% nebulizer solution Take 2.5 mg by nebulization every 6 (six) hours as needed for wheezing or shortness of breath., Until Discontinued, Historical Med    aspirin EC 325 MG tablet Take 325 mg by mouth daily., Until Discontinued, Historical Med    Brinzolamide-Brimonidine (SIMBRINZA) 1-0.2 % SUSP Place 1 drop into both eyes 3 (three) times daily., Until Discontinued, Historical Med    calcium-vitamin D (OSCAL WITH D) 500-200 MG-UNIT per tablet Take 1 tablet by mouth daily with breakfast. , Until Discontinued, Historical Med    cinacalcet (SENSIPAR) 60 MG tablet Take 60 mg by mouth daily., Until Discontinued, Historical Med    clopidogrel (PLAVIX) 75 MG tablet Take 75 mg by mouth daily with breakfast., Until Discontinued, Historical Med    cyanocobalamin 500 MCG tablet Take 500 mcg by mouth daily. Vitamin B12, Until  Discontinued, Historical Med    docusate sodium (COLACE) 100 MG capsule Take 100 mg by mouth at bedtime. , Until Discontinued, Historical Med    folic acid-vitamin b complex-vitamin c-selenium-zinc (DIALYVITE) 3 MG TABS tablet Take 1 tablet by mouth daily., Until Discontinued, Historical Med    insulin aspart (NOVOLOG) 100 UNIT/ML injection Inject 2-8 Units into the skin 3 (three) times daily with meals. Per sliding scale, Starting 04/22/2013, Until Discontinued, Historical Med    latanoprost (XALATAN) 0.005 % ophthalmic solution Place 1 drop into both eyes at bedtime., Until Discontinued, Historical Med    midodrine (PROAMATINE) 5 MG tablet Take 10 mg by mouth See admin instructions. Take 2 tablets (10 mg) 3 times daily on Monday, Wednesday, Friday (dialysis days), Until Discontinued, Historical Med    omeprazole (PRILOSEC) 20 MG capsule Take 20 mg by mouth 2 (two) times daily. , Until Discontinued, Historical Med    polyvinyl alcohol (LIQUIFILM TEARS) 1.4 % ophthalmic solution Place 1 drop into both eyes 3 (three) times daily as needed for dry eyes. , Until Discontinued, Historical Med    pravastatin (PRAVACHOL) 40 MG tablet Take 40 mg by mouth at bedtime. , Until Discontinued, Historical Med    rOPINIRole (REQUIP) 0.25 MG tablet Take 0.5 mg by mouth at bedtime. , Until Discontinued, Historical Med    timolol (TIMOPTIC) 0.5 % ophthalmic solution Place 1 drop into both eyes daily., Until Discontinued, Historical Med      STOP taking these medications     calcium acetate (PHOSLO) 667 MG capsule  gabapentin (NEURONTIN) 300 MG capsule      traMADol (ULTRAM) 50 MG tablet        Follow-up Information    Follow up with PATEL,JAY K., MD. Schedule an appointment as soon as possible for a visit in 1 week.   Specialty:  Nephrology   Why:  post hospitalization follow up   Contact information:   [address] [place] [address] [phone]       TOTAL DISCHARGE TIME: 35  minutes  [facility]  [facility] Pager [phone]  05/20/2014, 6:20 PM

## 2014-05-20 NOTE — Progress Notes (Signed)
Patient Discharge:  Disposition: Pt discharged home with wife  Education: Pt and wife educated on medications, follow up appointments, and all discharge instructions. Pt and wife verbalized understanding.   IV: Removed  Telemetry: Removed  Follow-up appointments:Reviewed with pt  Prescriptions: Scripts given to pt  Transportation: Transported home by wife  Belongings: All belongings taken with pt

## 2014-05-20 NOTE — Progress Notes (Signed)
Inpatient Diabetes Program Recommendations  AACE/ADA: New Consensus Statement on Inpatient Glycemic Control (2013)  Target Ranges:  Prepandial:   less than 140 mg/dL      Peak postprandial:   less than 180 mg/dL (1-2 hours)      Critically ill patients:  140 - 180 mg/dL   Inpatient Diabetes Program Recommendations Insulin - Basal: Noted lantus dose to be decreased to 20 units bid starting last HS, but given the full dose of 30 units. Pt with low cbg's again. Pt may still need increment decreases to avoid hypoglycemia.  Thank you Lenor Coffin, RN, MSN, CDE  Diabetes Inpatient Program Office: [phone] Pager: [phone] 8:00 am to 5:00 pm

## 2014-05-20 NOTE — Progress Notes (Signed)
OT Cancellation Note  Patient Details Name: [name] MRN: [id number] DOB: 10/10/1945   Cancelled Treatment: Reason Eval/Treat Not Completed: Patient at procedure or test/ unavailable (HD). Will follow.  Garald Braver A 05/20/2014 8:36 am

## 2014-05-21 LAB — HEMOGLOBIN A1C
HEMOGLOBIN A1C: 7.4 % — AB (ref 4.8–5.6)
MEAN PLASMA GLUCOSE: 166 mg/dL

## 2014-05-21 NOTE — Care Management Note (Signed)
Case Management Note  Patient Details  Name: [name] MRN: [id number] Date of Birth: 1945-04-14  Subjective/Objective:                 CM for d/c planning.   Action/Plan: Pt was d/c prior to [facility] order being received on 05/20/2014, call placed to pt at home, however in home care giver stated that the pt could not take the call. Today 05/21/2014 this CM was able to speak to the pt who agreed to [facility] and HHPT /HHOT visits. He states that he has had this previously and a search of his past hospitalizations revealed that [facility] has provided these services. This pt would like to use [place] again for this services. [facility] was notified of orders and will begin services the first of the week, dependent upon pt hemodialysis schedule. They will contact the pt. 05/  Expected Discharge Date:     05/20/2014             Expected Discharge Plan:  Home w Home Health Services  In-House Referral:  NA  Discharge planning Services  CM Consult  Post Acute Care Choice:  NA Choice offered to:  Patient  DME Arranged:    DME Agency:     HH Arranged:  RN, PT, OT HH Agency:  [facility]  Status of Service:   Complete  Medicare Important Message Given:  No Date Medicare IM Given:    Medicare IM give by:    Date Additional Medicare IM Given:    Additional Medicare Important Message give by:     If discussed at Long Length of Stay Meetings, dates discussed:    Additional Comments:  Starlyn Skeans, RN 05/21/2014, 3:36 PM

## 2014-05-24 LAB — CULTURE, BLOOD (ROUTINE X 2)
Culture: NO GROWTH
Culture: NO GROWTH

## 2014-07-03 DIAGNOSIS — R109 Unspecified abdominal pain: Secondary | ICD-10-CM | POA: Diagnosis not present

## 2014-07-03 DIAGNOSIS — R10814 Left lower quadrant abdominal tenderness: Secondary | ICD-10-CM | POA: Diagnosis not present

## 2014-07-03 DIAGNOSIS — Z9981 Dependence on supplemental oxygen: Secondary | ICD-10-CM | POA: Diagnosis not present

## 2014-07-03 DIAGNOSIS — Z7401 Bed confinement status: Secondary | ICD-10-CM | POA: Diagnosis not present

## 2014-07-03 DIAGNOSIS — K2970 Gastritis, unspecified, without bleeding: Secondary | ICD-10-CM | POA: Diagnosis not present

## 2014-08-02 DEATH — deceased
